# Patient Record
Sex: Female | Born: 1935 | Race: White | Hispanic: No | Smoking: Former smoker
Health system: Southern US, Community
[De-identification: ages and names within clinical notes are randomized; demographics above are authoritative.]

## PROBLEM LIST (undated history)

## (undated) DIAGNOSIS — R0602 Shortness of breath: Secondary | ICD-10-CM

## (undated) DIAGNOSIS — F32A Depression, unspecified: Secondary | ICD-10-CM

## (undated) DIAGNOSIS — R51 Headache: Secondary | ICD-10-CM

## (undated) DIAGNOSIS — N390 Urinary tract infection, site not specified: Secondary | ICD-10-CM

## (undated) DIAGNOSIS — F329 Major depressive disorder, single episode, unspecified: Secondary | ICD-10-CM

## (undated) DIAGNOSIS — D649 Anemia, unspecified: Secondary | ICD-10-CM

## (undated) DIAGNOSIS — I10 Essential (primary) hypertension: Secondary | ICD-10-CM

## (undated) DIAGNOSIS — K219 Gastro-esophageal reflux disease without esophagitis: Secondary | ICD-10-CM

## (undated) DIAGNOSIS — E039 Hypothyroidism, unspecified: Secondary | ICD-10-CM

## (undated) DIAGNOSIS — M549 Dorsalgia, unspecified: Secondary | ICD-10-CM

## (undated) DIAGNOSIS — C801 Malignant (primary) neoplasm, unspecified: Secondary | ICD-10-CM

## (undated) DIAGNOSIS — J45909 Unspecified asthma, uncomplicated: Secondary | ICD-10-CM

## (undated) DIAGNOSIS — A419 Sepsis, unspecified organism: Secondary | ICD-10-CM

## (undated) DIAGNOSIS — I739 Peripheral vascular disease, unspecified: Secondary | ICD-10-CM

## (undated) DIAGNOSIS — G8929 Other chronic pain: Secondary | ICD-10-CM

## (undated) DIAGNOSIS — M7581 Other shoulder lesions, right shoulder: Secondary | ICD-10-CM

## (undated) DIAGNOSIS — M7582 Other shoulder lesions, left shoulder: Secondary | ICD-10-CM

## (undated) HISTORY — PX: TUBAL LIGATION: SHX77

## (undated) HISTORY — PX: ABDOMINAL HYSTERECTOMY: SHX81

## (undated) HISTORY — PX: EYE SURGERY: SHX253

## (undated) HISTORY — PX: BLADDER REPAIR: SHX76

## (undated) HISTORY — DX: Sepsis, unspecified organism: A41.9

## (undated) HISTORY — PX: TONSILLECTOMY: SUR1361

## (undated) HISTORY — PX: APPENDECTOMY: SHX54

## (undated) HISTORY — DX: Urinary tract infection, site not specified: N39.0

---

## 2004-08-31 ENCOUNTER — Ambulatory Visit: Payer: Self-pay | Admitting: Family Medicine

## 2005-01-10 ENCOUNTER — Encounter: Payer: Self-pay | Admitting: Unknown Physician Specialty

## 2005-01-15 ENCOUNTER — Encounter: Payer: Self-pay | Admitting: Unknown Physician Specialty

## 2005-02-14 ENCOUNTER — Encounter: Payer: Self-pay | Admitting: Unknown Physician Specialty

## 2005-09-06 ENCOUNTER — Ambulatory Visit: Payer: Self-pay | Admitting: Unknown Physician Specialty

## 2005-12-13 ENCOUNTER — Ambulatory Visit: Payer: Self-pay | Admitting: Unknown Physician Specialty

## 2006-09-11 ENCOUNTER — Ambulatory Visit: Payer: Self-pay | Admitting: Unknown Physician Specialty

## 2007-11-15 ENCOUNTER — Ambulatory Visit: Payer: Self-pay | Admitting: Unknown Physician Specialty

## 2008-01-04 ENCOUNTER — Ambulatory Visit: Payer: Self-pay | Admitting: Unknown Physician Specialty

## 2008-03-10 ENCOUNTER — Encounter: Payer: Self-pay | Admitting: Rheumatology

## 2008-03-17 ENCOUNTER — Encounter: Payer: Self-pay | Admitting: Rheumatology

## 2008-04-16 ENCOUNTER — Ambulatory Visit: Payer: Self-pay | Admitting: Internal Medicine

## 2008-04-16 ENCOUNTER — Encounter: Payer: Self-pay | Admitting: Rheumatology

## 2008-04-21 ENCOUNTER — Ambulatory Visit: Payer: Self-pay | Admitting: Internal Medicine

## 2008-05-17 ENCOUNTER — Ambulatory Visit: Payer: Self-pay | Admitting: Internal Medicine

## 2008-06-16 ENCOUNTER — Ambulatory Visit: Payer: Self-pay | Admitting: Pain Medicine

## 2008-06-17 ENCOUNTER — Ambulatory Visit: Payer: Self-pay | Admitting: Internal Medicine

## 2008-06-26 ENCOUNTER — Encounter: Payer: Self-pay | Admitting: Pain Medicine

## 2008-07-17 ENCOUNTER — Ambulatory Visit: Payer: Self-pay | Admitting: Internal Medicine

## 2008-07-17 ENCOUNTER — Encounter: Payer: Self-pay | Admitting: Pain Medicine

## 2008-07-21 ENCOUNTER — Ambulatory Visit: Payer: Self-pay | Admitting: Pain Medicine

## 2008-07-31 ENCOUNTER — Ambulatory Visit: Payer: Self-pay | Admitting: Pain Medicine

## 2008-08-17 ENCOUNTER — Encounter: Payer: Self-pay | Admitting: Pain Medicine

## 2008-08-17 ENCOUNTER — Ambulatory Visit: Payer: Self-pay | Admitting: Internal Medicine

## 2008-08-27 ENCOUNTER — Ambulatory Visit: Payer: Self-pay | Admitting: Physician Assistant

## 2008-09-09 ENCOUNTER — Ambulatory Visit: Payer: Self-pay | Admitting: Pain Medicine

## 2008-09-16 ENCOUNTER — Ambulatory Visit: Payer: Self-pay | Admitting: Internal Medicine

## 2008-09-16 ENCOUNTER — Encounter: Payer: Self-pay | Admitting: Pain Medicine

## 2008-10-17 ENCOUNTER — Ambulatory Visit: Payer: Self-pay | Admitting: Internal Medicine

## 2008-10-21 ENCOUNTER — Ambulatory Visit: Payer: Self-pay | Admitting: Pain Medicine

## 2008-12-09 ENCOUNTER — Ambulatory Visit: Payer: Self-pay

## 2008-12-09 ENCOUNTER — Ambulatory Visit: Payer: Self-pay | Admitting: Internal Medicine

## 2008-12-15 ENCOUNTER — Ambulatory Visit: Payer: Self-pay | Admitting: Internal Medicine

## 2009-01-01 ENCOUNTER — Ambulatory Visit: Payer: Self-pay | Admitting: Unknown Physician Specialty

## 2009-01-08 ENCOUNTER — Ambulatory Visit: Payer: Self-pay | Admitting: Physician Assistant

## 2009-01-15 ENCOUNTER — Ambulatory Visit: Payer: Self-pay | Admitting: Internal Medicine

## 2009-02-11 ENCOUNTER — Ambulatory Visit: Payer: Self-pay | Admitting: Physician Assistant

## 2009-03-06 ENCOUNTER — Ambulatory Visit: Payer: Self-pay | Admitting: Internal Medicine

## 2009-03-10 ENCOUNTER — Ambulatory Visit: Payer: Self-pay | Admitting: Physician Assistant

## 2009-03-17 ENCOUNTER — Ambulatory Visit: Payer: Self-pay | Admitting: Internal Medicine

## 2009-03-24 ENCOUNTER — Ambulatory Visit: Payer: Self-pay | Admitting: Pain Medicine

## 2009-04-08 ENCOUNTER — Ambulatory Visit: Payer: Self-pay | Admitting: Physician Assistant

## 2009-05-01 ENCOUNTER — Ambulatory Visit: Payer: Self-pay | Admitting: Internal Medicine

## 2009-05-17 ENCOUNTER — Ambulatory Visit: Payer: Self-pay | Admitting: Internal Medicine

## 2009-06-16 ENCOUNTER — Ambulatory Visit: Payer: Self-pay | Admitting: Pain Medicine

## 2009-06-17 ENCOUNTER — Ambulatory Visit: Payer: Self-pay | Admitting: Internal Medicine

## 2009-06-26 ENCOUNTER — Ambulatory Visit: Payer: Self-pay | Admitting: Internal Medicine

## 2009-06-30 ENCOUNTER — Ambulatory Visit: Payer: Self-pay | Admitting: Pain Medicine

## 2009-07-13 ENCOUNTER — Ambulatory Visit: Payer: Self-pay | Admitting: Pain Medicine

## 2009-07-17 ENCOUNTER — Ambulatory Visit: Payer: Self-pay | Admitting: Internal Medicine

## 2009-10-15 ENCOUNTER — Ambulatory Visit: Payer: Self-pay | Admitting: Physician Assistant

## 2009-11-24 ENCOUNTER — Ambulatory Visit: Payer: Self-pay | Admitting: Pain Medicine

## 2009-12-28 ENCOUNTER — Ambulatory Visit: Payer: Self-pay | Admitting: Pain Medicine

## 2010-01-28 ENCOUNTER — Ambulatory Visit: Payer: Self-pay | Admitting: Pain Medicine

## 2010-03-01 ENCOUNTER — Ambulatory Visit: Payer: Self-pay | Admitting: Pain Medicine

## 2010-07-26 ENCOUNTER — Ambulatory Visit: Payer: Self-pay | Admitting: Pain Medicine

## 2010-08-03 ENCOUNTER — Ambulatory Visit: Payer: Self-pay | Admitting: Pain Medicine

## 2010-08-30 ENCOUNTER — Ambulatory Visit: Payer: Self-pay | Admitting: Pain Medicine

## 2010-09-21 ENCOUNTER — Ambulatory Visit: Payer: Self-pay | Admitting: Pain Medicine

## 2010-10-05 ENCOUNTER — Ambulatory Visit: Payer: Self-pay | Admitting: Pain Medicine

## 2010-11-08 ENCOUNTER — Ambulatory Visit: Payer: Self-pay | Admitting: Pain Medicine

## 2010-11-16 ENCOUNTER — Ambulatory Visit: Payer: Self-pay | Admitting: Unknown Physician Specialty

## 2010-12-06 ENCOUNTER — Ambulatory Visit: Payer: Self-pay | Admitting: Pain Medicine

## 2011-01-05 ENCOUNTER — Ambulatory Visit: Payer: Self-pay | Admitting: Pain Medicine

## 2011-01-11 ENCOUNTER — Ambulatory Visit: Payer: Self-pay | Admitting: Pain Medicine

## 2011-01-25 ENCOUNTER — Ambulatory Visit: Payer: Self-pay | Admitting: Pain Medicine

## 2011-02-07 ENCOUNTER — Ambulatory Visit: Payer: Self-pay | Admitting: Pain Medicine

## 2011-02-09 ENCOUNTER — Other Ambulatory Visit: Payer: Self-pay | Admitting: Pain Medicine

## 2011-02-09 ENCOUNTER — Encounter: Payer: Self-pay | Admitting: Pain Medicine

## 2011-02-15 ENCOUNTER — Encounter: Payer: Self-pay | Admitting: Pain Medicine

## 2011-03-29 ENCOUNTER — Ambulatory Visit: Payer: Self-pay | Admitting: Pain Medicine

## 2011-04-11 ENCOUNTER — Ambulatory Visit: Payer: Self-pay | Admitting: Pain Medicine

## 2011-06-09 ENCOUNTER — Ambulatory Visit: Payer: Self-pay | Admitting: Pain Medicine

## 2011-07-11 ENCOUNTER — Ambulatory Visit: Payer: Self-pay | Admitting: Pain Medicine

## 2011-07-14 ENCOUNTER — Ambulatory Visit: Payer: Self-pay | Admitting: Pain Medicine

## 2011-08-01 ENCOUNTER — Ambulatory Visit: Payer: Self-pay | Admitting: Pain Medicine

## 2012-01-05 ENCOUNTER — Ambulatory Visit: Payer: Self-pay | Admitting: Pain Medicine

## 2012-01-10 ENCOUNTER — Ambulatory Visit: Payer: Self-pay | Admitting: Unknown Physician Specialty

## 2012-01-23 ENCOUNTER — Ambulatory Visit: Payer: Self-pay | Admitting: Pain Medicine

## 2012-02-16 ENCOUNTER — Ambulatory Visit: Payer: Self-pay | Admitting: Pain Medicine

## 2012-03-08 ENCOUNTER — Ambulatory Visit: Payer: Self-pay | Admitting: Pain Medicine

## 2012-04-24 ENCOUNTER — Emergency Department: Payer: Self-pay | Admitting: Unknown Physician Specialty

## 2012-04-24 LAB — URINALYSIS, COMPLETE
Glucose,UR: NEGATIVE mg/dL (ref 0–75)
Leukocyte Esterase: NEGATIVE
Nitrite: NEGATIVE
Ph: 5 (ref 4.5–8.0)
Protein: NEGATIVE
RBC,UR: 2 /HPF (ref 0–5)
Squamous Epithelial: 1

## 2012-05-22 ENCOUNTER — Ambulatory Visit: Payer: Self-pay | Admitting: Pain Medicine

## 2012-06-11 ENCOUNTER — Ambulatory Visit: Payer: Self-pay | Admitting: Pain Medicine

## 2012-07-11 ENCOUNTER — Ambulatory Visit: Payer: Self-pay | Admitting: Pain Medicine

## 2012-07-24 ENCOUNTER — Ambulatory Visit: Payer: Self-pay | Admitting: Pain Medicine

## 2012-10-30 ENCOUNTER — Encounter: Payer: Self-pay | Admitting: Physician Assistant

## 2012-11-09 ENCOUNTER — Ambulatory Visit: Payer: Self-pay | Admitting: Pain Medicine

## 2012-11-13 ENCOUNTER — Ambulatory Visit: Payer: Self-pay | Admitting: Pain Medicine

## 2012-11-17 ENCOUNTER — Encounter: Payer: Self-pay | Admitting: Physician Assistant

## 2012-12-03 ENCOUNTER — Ambulatory Visit: Payer: Self-pay | Admitting: Pain Medicine

## 2013-01-10 ENCOUNTER — Ambulatory Visit: Payer: Self-pay | Admitting: Pain Medicine

## 2013-02-12 DIAGNOSIS — N3020 Other chronic cystitis without hematuria: Secondary | ICD-10-CM | POA: Insufficient documentation

## 2013-02-12 DIAGNOSIS — N3941 Urge incontinence: Secondary | ICD-10-CM | POA: Insufficient documentation

## 2013-02-12 DIAGNOSIS — N390 Urinary tract infection, site not specified: Secondary | ICD-10-CM | POA: Insufficient documentation

## 2013-02-12 DIAGNOSIS — R350 Frequency of micturition: Secondary | ICD-10-CM | POA: Insufficient documentation

## 2013-02-18 ENCOUNTER — Other Ambulatory Visit: Payer: Self-pay | Admitting: Pain Medicine

## 2013-02-18 ENCOUNTER — Ambulatory Visit: Payer: Self-pay | Admitting: Pain Medicine

## 2013-02-18 LAB — BASIC METABOLIC PANEL
Calcium, Total: 9 mg/dL (ref 8.5–10.1)
Chloride: 106 mmol/L (ref 98–107)
EGFR (Non-African Amer.): >60
Osmolality: 280 (ref 275–301)
Sodium: 139 mmol/L (ref 136–145)

## 2013-02-18 LAB — MAGNESIUM: Magnesium: 1.8 mg/dL

## 2013-02-28 ENCOUNTER — Ambulatory Visit: Payer: Self-pay | Admitting: Pain Medicine

## 2013-03-19 ENCOUNTER — Other Ambulatory Visit: Payer: Self-pay | Admitting: Neurosurgery

## 2013-03-25 ENCOUNTER — Ambulatory Visit: Payer: Self-pay | Admitting: Pain Medicine

## 2013-04-29 ENCOUNTER — Encounter (HOSPITAL_COMMUNITY): Payer: Self-pay

## 2013-04-29 ENCOUNTER — Encounter (HOSPITAL_COMMUNITY)
Admission: RE | Admit: 2013-04-29 | Discharge: 2013-04-29 | Disposition: A | Discharge status: Home / Self Care | Payer: Medicare Other | Source: Ambulatory Visit | Attending: Neurosurgery | Admitting: Neurosurgery

## 2013-04-29 HISTORY — DX: Unspecified asthma, uncomplicated: J45.909

## 2013-04-29 HISTORY — DX: Gastro-esophageal reflux disease without esophagitis: K21.9

## 2013-04-29 HISTORY — DX: Depression, unspecified: F32.A

## 2013-04-29 HISTORY — DX: Peripheral vascular disease, unspecified: I73.9

## 2013-04-29 HISTORY — DX: Headache: R51

## 2013-04-29 HISTORY — DX: Shortness of breath: R06.02

## 2013-04-29 HISTORY — DX: Anemia, unspecified: D64.9

## 2013-04-29 HISTORY — DX: Malignant (primary) neoplasm, unspecified: C80.1

## 2013-04-29 HISTORY — DX: Essential (primary) hypertension: I10

## 2013-04-29 HISTORY — DX: Hypothyroidism, unspecified: E03.9

## 2013-04-29 HISTORY — DX: Major depressive disorder, single episode, unspecified: F32.9

## 2013-04-29 LAB — BASIC METABOLIC PANEL
BUN: 18 mg/dL (ref 6–23)
Chloride: 101 mEq/L (ref 96–112)
GFR calc Af Amer: 64 mL/min — ABNORMAL LOW (ref >90)
Potassium: 4.9 mEq/L (ref 3.5–5.1)
Sodium: 138 mEq/L (ref 135–145)

## 2013-04-29 LAB — CBC
HCT: 34.6 % — ABNORMAL LOW (ref 36.0–46.0)
Hemoglobin: 11.2 g/dL — ABNORMAL LOW (ref 12.0–15.0)
MCHC: 32.4 g/dL (ref 30.0–36.0)
RBC: 5.36 MIL/uL — ABNORMAL HIGH (ref 3.87–5.11)
WBC: 7.2 10*3/uL (ref 4.0–10.5)

## 2013-04-29 LAB — SURGICAL PCR SCREEN: MRSA, PCR: POSITIVE — AB

## 2013-04-29 NOTE — Pre-Procedure Instructions (Signed)
Laura Frey  04/29/2013   Your procedure is scheduled on:  Wednesday May 01, 2013  Report to [facility] at 0630 AM.  Call this number if you have problems the morning of surgery: [phone]   Remember:   Do not eat food or drink liquids after midnight.   Take these medicines the morning of surgery with A SIP OF WATER: Allegra,  Amlodipine, Synthroid, Metoprolol, Prilosec, and  Albuterol inhaler bring with day of surgery.   Do not wear jewelry, make-up or nail polish.  Do not wear lotions, powders, or perfumes. You may wear deodorant.  Do not shave 48 hours prior to surgery.   Do not bring valuables to the hospital.  [facility] is not responsible   for any belongings or valuables.  Contacts, dentures or bridgework may not be worn into surgery.  Leave suitcase in the car. After surgery it may be brought to your room.  For patients admitted to the hospital, checkout time is 11:00 AM the day of  discharge.   Patients discharged the day of surgery will not be allowed to drive home.   Special Instructions: Shower using CHG 2 nights before surgery and the night before surgery.  If you shower the day of surgery use CHG.  Use special wash - you have one bottle of CHG for all showers.  You should use approximately 1/3 of the bottle for each shower.   Please read over the following fact sheets that you were given: Pain Booklet, Coughing and Deep Breathing, MRSA Information and Surgical Site Infection Prevention

## 2013-04-30 MED ORDER — CEFAZOLIN SODIUM-DEXTROSE 2-3 GM-% IV SOLR
2.0000 g | INTRAVENOUS | Status: AC
  Administered 2013-05-01: 2 g via INTRAVENOUS
  Filled 2013-04-30 (×2): qty 50

## 2013-05-01 ENCOUNTER — Encounter (HOSPITAL_COMMUNITY)
Admission: RE | Disposition: A | Discharge status: Home Health | Payer: Self-pay | Source: Ambulatory Visit | Attending: Neurosurgery

## 2013-05-01 ENCOUNTER — Ambulatory Visit (HOSPITAL_COMMUNITY): Payer: Medicare Other | Admitting: Certified Registered"

## 2013-05-01 ENCOUNTER — Ambulatory Visit (HOSPITAL_COMMUNITY)
Admission: RE | Admit: 2013-05-01 | Discharge: 2013-05-06 | Disposition: A | Discharge status: Home Health | Payer: Medicare Other | Source: Ambulatory Visit | Attending: Neurosurgery | Admitting: Neurosurgery

## 2013-05-01 ENCOUNTER — Ambulatory Visit (HOSPITAL_COMMUNITY): Payer: Medicare Other

## 2013-05-01 ENCOUNTER — Encounter (HOSPITAL_COMMUNITY): Payer: Self-pay | Admitting: *Deleted

## 2013-05-01 ENCOUNTER — Encounter (HOSPITAL_COMMUNITY): Payer: Self-pay | Admitting: Certified Registered"

## 2013-05-01 DIAGNOSIS — I10 Essential (primary) hypertension: Secondary | ICD-10-CM | POA: Insufficient documentation

## 2013-05-01 DIAGNOSIS — Z01818 Encounter for other preprocedural examination: Secondary | ICD-10-CM | POA: Insufficient documentation

## 2013-05-01 DIAGNOSIS — Z01812 Encounter for preprocedural laboratory examination: Secondary | ICD-10-CM | POA: Insufficient documentation

## 2013-05-01 DIAGNOSIS — Z79899 Other long term (current) drug therapy: Secondary | ICD-10-CM | POA: Insufficient documentation

## 2013-05-01 DIAGNOSIS — M4712 Other spondylosis with myelopathy, cervical region: Secondary | ICD-10-CM | POA: Insufficient documentation

## 2013-05-01 DIAGNOSIS — R262 Difficulty in walking, not elsewhere classified: Secondary | ICD-10-CM | POA: Insufficient documentation

## 2013-05-01 DIAGNOSIS — R0989 Other specified symptoms and signs involving the circulatory and respiratory systems: Secondary | ICD-10-CM | POA: Insufficient documentation

## 2013-05-01 DIAGNOSIS — R0609 Other forms of dyspnea: Secondary | ICD-10-CM | POA: Insufficient documentation

## 2013-05-01 DIAGNOSIS — Z01810 Encounter for preprocedural cardiovascular examination: Secondary | ICD-10-CM | POA: Insufficient documentation

## 2013-05-01 HISTORY — PX: ANTERIOR CERVICAL DECOMP/DISCECTOMY FUSION: SHX1161

## 2013-05-01 HISTORY — DX: Other shoulder lesions, right shoulder: M75.81

## 2013-05-01 HISTORY — DX: Other chronic pain: G89.29

## 2013-05-01 HISTORY — DX: Other shoulder lesions, left shoulder: M75.82

## 2013-05-01 HISTORY — DX: Other chronic pain: M54.9

## 2013-05-01 SURGERY — ANTERIOR CERVICAL DECOMPRESSION/DISCECTOMY FUSION 3 LEVELS
Anesthesia: General | Site: Neck | Wound class: Clean

## 2013-05-01 MED ORDER — DEXAMETHASONE SODIUM PHOSPHATE 10 MG/ML IJ SOLN
10.0000 mg | INTRAMUSCULAR | Status: AC
  Administered 2013-05-01: 10 mg via INTRAVENOUS

## 2013-05-01 MED ORDER — SODIUM CHLORIDE 0.9 % IV SOLN
INTRAVENOUS | Status: AC
  Filled 2013-05-01: qty 500

## 2013-05-01 MED ORDER — LORATADINE 10 MG PO TABS
10.0000 mg | ORAL_TABLET | Freq: Every day | ORAL | Status: DC
  Administered 2013-05-02 – 2013-05-06 (×5): 10 mg via ORAL
  Filled 2013-05-01 (×5): qty 1

## 2013-05-01 MED ORDER — VITAMIN E 180 MG (400 UNIT) PO CAPS
400.0000 [IU] | ORAL_CAPSULE | Freq: Every day | ORAL | Status: DC
  Administered 2013-05-02 – 2013-05-06 (×5): 400 [IU] via ORAL
  Filled 2013-05-01 (×5): qty 1

## 2013-05-01 MED ORDER — ADULT MULTIVITAMIN W/MINERALS CH
1.0000 | ORAL_TABLET | Freq: Every day | ORAL | Status: DC
  Administered 2013-05-02 – 2013-05-06 (×5): 1 via ORAL
  Filled 2013-05-01 (×5): qty 1

## 2013-05-01 MED ORDER — HYDROCHLOROTHIAZIDE 12.5 MG PO CAPS
12.5000 mg | ORAL_CAPSULE | Freq: Every day | ORAL | Status: DC
  Administered 2013-05-02 – 2013-05-06 (×4): 12.5 mg via ORAL
  Filled 2013-05-01 (×5): qty 1

## 2013-05-01 MED ORDER — NEOSTIGMINE METHYLSULFATE 1 MG/ML IJ SOLN
INTRAMUSCULAR | Status: DC | PRN
  Administered 2013-05-01: 3 mg via INTRAVENOUS

## 2013-05-01 MED ORDER — ALBUTEROL SULFATE HFA 108 (90 BASE) MCG/ACT IN AERS
2.0000 | INHALATION_SPRAY | Freq: Four times a day (QID) | RESPIRATORY_TRACT | Status: DC | PRN
  Filled 2013-05-01: qty 6.7

## 2013-05-01 MED ORDER — POTASSIUM CHLORIDE CRYS ER 10 MEQ PO TBCR
10.0000 meq | EXTENDED_RELEASE_TABLET | Freq: Every day | ORAL | Status: DC
  Administered 2013-05-02 – 2013-05-06 (×5): 10 meq via ORAL
  Filled 2013-05-01 (×6): qty 1

## 2013-05-01 MED ORDER — HYDROMORPHONE HCL PF 1 MG/ML IJ SOLN: 0.2500 mg | INTRAMUSCULAR | Status: DC | PRN

## 2013-05-01 MED ORDER — FENTANYL CITRATE 0.05 MG/ML IJ SOLN
INTRAMUSCULAR | Status: DC | PRN
  Administered 2013-05-01: 100 ug via INTRAVENOUS
  Administered 2013-05-01 (×2): 50 ug via INTRAVENOUS

## 2013-05-01 MED ORDER — GLYCOPYRROLATE 0.2 MG/ML IJ SOLN
INTRAMUSCULAR | Status: DC | PRN
  Administered 2013-05-01: 0.4 mg via INTRAVENOUS

## 2013-05-01 MED ORDER — ACETAMINOPHEN 325 MG PO TABS
650.0000 mg | ORAL_TABLET | ORAL | Status: DC | PRN
  Filled 2013-05-01: qty 2

## 2013-05-01 MED ORDER — AMLODIPINE BESYLATE 5 MG PO TABS
5.0000 mg | ORAL_TABLET | Freq: Every day | ORAL | Status: DC
  Administered 2013-05-02 – 2013-05-06 (×5): 5 mg via ORAL
  Filled 2013-05-01 (×5): qty 1

## 2013-05-01 MED ORDER — LIDOCAINE HCL 4 % MT SOLN
OROMUCOSAL | Status: DC | PRN
  Administered 2013-05-01: 4 mL via TOPICAL

## 2013-05-01 MED ORDER — ALUM & MAG HYDROXIDE-SIMETH 200-200-20 MG/5ML PO SUSP: 30.0000 mL | Freq: Four times a day (QID) | ORAL | Status: DC | PRN

## 2013-05-01 MED ORDER — ACETAMINOPHEN 650 MG RE SUPP: 650.0000 mg | RECTAL | Status: DC | PRN

## 2013-05-01 MED ORDER — SODIUM CHLORIDE 0.9 % IJ SOLN: 3.0000 mL | INTRAMUSCULAR | Status: DC | PRN

## 2013-05-01 MED ORDER — ONDANSETRON HCL 4 MG/2ML IJ SOLN: 4.0000 mg | INTRAMUSCULAR | Status: DC | PRN

## 2013-05-01 MED ORDER — BACITRACIN 50000 UNITS IM SOLR
INTRAMUSCULAR | Status: AC
  Filled 2013-05-01: qty 1

## 2013-05-01 MED ORDER — SODIUM CHLORIDE 0.9 % IV SOLN
250.0000 mL | INTRAVENOUS | Status: DC
  Administered 2013-05-01: 250 mL via INTRAVENOUS

## 2013-05-01 MED ORDER — ARTIFICIAL TEARS OP OINT
TOPICAL_OINTMENT | OPHTHALMIC | Status: DC | PRN
  Administered 2013-05-01: 1 via OPHTHALMIC

## 2013-05-01 MED ORDER — ONDANSETRON HCL 4 MG/2ML IJ SOLN: 4.0000 mg | Freq: Once | INTRAMUSCULAR | Status: DC | PRN

## 2013-05-01 MED ORDER — HYDROMORPHONE HCL PF 1 MG/ML IJ SOLN
0.5000 mg | INTRAMUSCULAR | Status: DC | PRN
  Administered 2013-05-01 – 2013-05-03 (×7): 0.5 mg via INTRAVENOUS
  Administered 2013-05-04 (×3): 1 mg via INTRAVENOUS
  Filled 2013-05-01 (×11): qty 1

## 2013-05-01 MED ORDER — PROPOFOL 10 MG/ML IV BOLUS
INTRAVENOUS | Status: DC | PRN
  Administered 2013-05-01: 130 mg via INTRAVENOUS

## 2013-05-01 MED ORDER — ESTROGENS CONJUGATED 0.45 MG PO TABS
0.4500 mg | ORAL_TABLET | Freq: Every day | ORAL | Status: DC
  Administered 2013-05-02 – 2013-05-06 (×5): 0.45 mg via ORAL
  Filled 2013-05-01 (×5): qty 1

## 2013-05-01 MED ORDER — PHENYLEPHRINE HCL 10 MG/ML IJ SOLN
INTRAMUSCULAR | Status: DC | PRN
  Administered 2013-05-01 (×2): 80 ug via INTRAVENOUS

## 2013-05-01 MED ORDER — IRBESARTAN 300 MG PO TABS
300.0000 mg | ORAL_TABLET | Freq: Every day | ORAL | Status: DC
  Administered 2013-05-02 – 2013-05-06 (×5): 300 mg via ORAL
  Filled 2013-05-01 (×5): qty 1

## 2013-05-01 MED ORDER — PHENOL 1.4 % MT LIQD: 1.0000 | OROMUCOSAL | Status: DC | PRN

## 2013-05-01 MED ORDER — OXYCODONE-ACETAMINOPHEN 5-325 MG PO TABS
1.0000 | ORAL_TABLET | ORAL | Status: DC | PRN
  Administered 2013-05-02: 2 via ORAL
  Filled 2013-05-01: qty 2

## 2013-05-01 MED ORDER — LEVOTHYROXINE SODIUM 88 MCG PO TABS
88.0000 ug | ORAL_TABLET | Freq: Every day | ORAL | Status: DC
  Administered 2013-05-02 – 2013-05-06 (×5): 88 ug via ORAL
  Filled 2013-05-01 (×6): qty 1

## 2013-05-01 MED ORDER — ONDANSETRON HCL 4 MG/2ML IJ SOLN
INTRAMUSCULAR | Status: DC | PRN
  Administered 2013-05-01: 4 mg via INTRAVENOUS

## 2013-05-01 MED ORDER — THROMBIN 20000 UNITS EX SOLR
CUTANEOUS | Status: DC | PRN
  Administered 2013-05-01: 10:00:00 via TOPICAL

## 2013-05-01 MED ORDER — SODIUM CHLORIDE 0.9 % IR SOLN
Status: DC | PRN
  Administered 2013-05-01: 10:00:00

## 2013-05-01 MED ORDER — METOPROLOL SUCCINATE ER 100 MG PO TB24
200.0000 mg | ORAL_TABLET | Freq: Every day | ORAL | Status: DC
  Administered 2013-05-02 – 2013-05-06 (×4): 200 mg via ORAL
  Filled 2013-05-01 (×5): qty 2

## 2013-05-01 MED ORDER — DEXAMETHASONE SODIUM PHOSPHATE 10 MG/ML IJ SOLN
INTRAMUSCULAR | Status: AC
  Filled 2013-05-01: qty 1

## 2013-05-01 MED ORDER — VALSARTAN-HYDROCHLOROTHIAZIDE 320-12.5 MG PO TABS: 1.0000 | ORAL_TABLET | Freq: Every day | ORAL | Status: DC

## 2013-05-01 MED ORDER — THROMBIN 5000 UNITS EX SOLR
OROMUCOSAL | Status: DC | PRN
  Administered 2013-05-01: 10:00:00 via TOPICAL

## 2013-05-01 MED ORDER — ROCURONIUM BROMIDE 100 MG/10ML IV SOLN
INTRAVENOUS | Status: DC | PRN
  Administered 2013-05-01: 40 mg via INTRAVENOUS

## 2013-05-01 MED ORDER — MUPIROCIN 2 % EX OINT
TOPICAL_OINTMENT | Freq: Two times a day (BID) | CUTANEOUS | Status: DC
  Administered 2013-05-01: 1 via NASAL
  Administered 2013-05-02 – 2013-05-05 (×5): via NASAL
  Filled 2013-05-01: qty 22

## 2013-05-01 MED ORDER — DOCUSATE SODIUM 100 MG PO CAPS
100.0000 mg | ORAL_CAPSULE | Freq: Two times a day (BID) | ORAL | Status: DC
  Administered 2013-05-01 – 2013-05-05 (×8): 100 mg via ORAL
  Filled 2013-05-01 (×8): qty 1

## 2013-05-01 MED ORDER — LIDOCAINE HCL (CARDIAC) 20 MG/ML IV SOLN
INTRAVENOUS | Status: DC | PRN
  Administered 2013-05-01: 80 mg via INTRAVENOUS

## 2013-05-01 MED ORDER — VITAMIN C 500 MG PO TABS
500.0000 mg | ORAL_TABLET | Freq: Every day | ORAL | Status: DC
  Administered 2013-05-02 – 2013-05-06 (×5): 500 mg via ORAL
  Filled 2013-05-01 (×5): qty 1

## 2013-05-01 MED ORDER — MUPIROCIN 2 % EX OINT
TOPICAL_OINTMENT | CUTANEOUS | Status: AC
  Administered 2013-05-01: 1
  Filled 2013-05-01: qty 22

## 2013-05-01 MED ORDER — CYCLOBENZAPRINE HCL 10 MG PO TABS
10.0000 mg | ORAL_TABLET | Freq: Three times a day (TID) | ORAL | Status: DC | PRN
  Administered 2013-05-04: 10 mg via ORAL
  Filled 2013-05-01 (×2): qty 1

## 2013-05-01 MED ORDER — CEFAZOLIN SODIUM 1-5 GM-% IV SOLN
1.0000 g | Freq: Three times a day (TID) | INTRAVENOUS | Status: AC
  Administered 2013-05-01 (×2): 1 g via INTRAVENOUS
  Filled 2013-05-01 (×2): qty 50

## 2013-05-01 MED ORDER — LACTATED RINGERS IV SOLN
INTRAVENOUS | Status: DC | PRN
  Administered 2013-05-01 (×2): via INTRAVENOUS

## 2013-05-01 MED ORDER — 0.9 % SODIUM CHLORIDE (POUR BTL) OPTIME
TOPICAL | Status: DC | PRN
  Administered 2013-05-01: 1000 mL

## 2013-05-01 MED ORDER — MENTHOL 3 MG MT LOZG
1.0000 | LOZENGE | OROMUCOSAL | Status: DC | PRN
  Filled 2013-05-01: qty 9

## 2013-05-01 MED ORDER — VITAMIN E 15 UNIT/0.3ML PO SOLN: 100.0000 [IU] | Freq: Every day | ORAL | Status: DC

## 2013-05-01 MED ORDER — PANTOPRAZOLE SODIUM 40 MG PO TBEC
40.0000 mg | DELAYED_RELEASE_TABLET | Freq: Every day | ORAL | Status: DC
  Administered 2013-05-02 – 2013-05-06 (×5): 40 mg via ORAL
  Filled 2013-05-01 (×5): qty 1

## 2013-05-01 MED ORDER — SODIUM CHLORIDE 0.9 % IJ SOLN
3.0000 mL | Freq: Two times a day (BID) | INTRAMUSCULAR | Status: DC
  Administered 2013-05-01 – 2013-05-02 (×3): 3 mL via INTRAVENOUS
  Administered 2013-05-03: 22:00:00 via INTRAVENOUS
  Administered 2013-05-03 – 2013-05-05 (×3): 3 mL via INTRAVENOUS

## 2013-05-01 SURGICAL SUPPLY — 59 items
BAG DECANTER FOR FLEXI CONT (MISCELLANEOUS) ×2 IMPLANT
BENZOIN TINCTURE PRP APPL 2/3 (GAUZE/BANDAGES/DRESSINGS) ×2 IMPLANT
BIT DRILL 13 (BIT) ×2 IMPLANT
BONE CERV LORDOTIC 14.5X12X7 (Bone Implant) ×4 IMPLANT
BONE CERV LORDOTIC 14.5X12X8 (Bone Implant) ×2 IMPLANT
BRUSH SCRUB EZ PLAIN DRY (MISCELLANEOUS) ×2 IMPLANT
BUR MATCHSTICK NEURO 3.0 LAGG (BURR) ×2 IMPLANT
CANISTER SUCTION 2500CC (MISCELLANEOUS) ×2 IMPLANT
CLOTH BEACON ORANGE TIMEOUT ST (SAFETY) ×2 IMPLANT
CONT SPEC 4OZ CLIKSEAL STRL BL (MISCELLANEOUS) ×2 IMPLANT
DECANTER SPIKE VIAL GLASS SM (MISCELLANEOUS) ×2 IMPLANT
DERMABOND ADVANCED (GAUZE/BANDAGES/DRESSINGS) ×1
DERMABOND ADVANCED .7 DNX12 (GAUZE/BANDAGES/DRESSINGS) ×1 IMPLANT
DRAPE C-ARM 42X72 X-RAY (DRAPES) ×4 IMPLANT
DRAPE LAPAROTOMY 100X72 PEDS (DRAPES) ×2 IMPLANT
DRAPE MICROSCOPE ZEISS OPMI (DRAPES) ×2 IMPLANT
DRAPE POUCH INSTRU U-SHP 10X18 (DRAPES) ×2 IMPLANT
DRSG OPSITE 4X5.5 SM (GAUZE/BANDAGES/DRESSINGS) ×2 IMPLANT
DURAPREP 6ML APPLICATOR 50/CS (WOUND CARE) ×2 IMPLANT
ELECT COATED BLADE 2.86 ST (ELECTRODE) ×2 IMPLANT
ELECT REM PT RETURN 9FT ADLT (ELECTROSURGICAL) ×2
ELECTRODE REM PT RTRN 9FT ADLT (ELECTROSURGICAL) ×1 IMPLANT
GAUZE SPONGE 4X4 16PLY XRAY LF (GAUZE/BANDAGES/DRESSINGS) IMPLANT
GLOVE BIO SURGEON STRL SZ8 (GLOVE) ×4 IMPLANT
GLOVE BIOGEL PI IND STRL 7.0 (GLOVE) ×4 IMPLANT
GLOVE BIOGEL PI INDICATOR 7.0 (GLOVE) ×4
GLOVE EXAM NITRILE LRG STRL (GLOVE) IMPLANT
GLOVE EXAM NITRILE MD LF STRL (GLOVE) ×2 IMPLANT
GLOVE EXAM NITRILE XL STR (GLOVE) IMPLANT
GLOVE EXAM NITRILE XS STR PU (GLOVE) IMPLANT
GLOVE INDICATOR 8.5 STRL (GLOVE) ×2 IMPLANT
GLOVE SURG SS PI 7.0 STRL IVOR (GLOVE) ×8 IMPLANT
GOWN BRE IMP SLV AUR LG STRL (GOWN DISPOSABLE) IMPLANT
GOWN BRE IMP SLV AUR XL STRL (GOWN DISPOSABLE) ×8 IMPLANT
GOWN STRL REIN 2XL LVL4 (GOWN DISPOSABLE) IMPLANT
HEAD HALTER (SOFTGOODS) ×2 IMPLANT
HEMOSTAT POWDER KIT SURGIFOAM (HEMOSTASIS) IMPLANT
KIT BASIN OR (CUSTOM PROCEDURE TRAY) ×2 IMPLANT
KIT ROOM TURNOVER OR (KITS) ×2 IMPLANT
NEEDLE SPNL 20GX3.5 QUINCKE YW (NEEDLE) ×2 IMPLANT
NS IRRIG 1000ML POUR BTL (IV SOLUTION) ×2 IMPLANT
PACK LAMINECTOMY NEURO (CUSTOM PROCEDURE TRAY) ×2 IMPLANT
PLATE 3 55XLCK NS SPNE CVD (Plate) ×1 IMPLANT
PLATE 3 ATLANTIS TRANS (Plate) ×1 IMPLANT
RUBBERBAND STERILE (MISCELLANEOUS) ×4 IMPLANT
SCREW ST FIX 4 ATL 3120213 (Screw) ×16 IMPLANT
SPONGE GAUZE 4X4 12PLY (GAUZE/BANDAGES/DRESSINGS) ×2 IMPLANT
SPONGE INTESTINAL PEANUT (DISPOSABLE) ×2 IMPLANT
SPONGE SURGIFOAM ABS GEL 100 (HEMOSTASIS) ×2 IMPLANT
STRIP CLOSURE SKIN 1/2X4 (GAUZE/BANDAGES/DRESSINGS) ×2 IMPLANT
SUT VIC AB 3-0 SH 8-18 (SUTURE) ×2 IMPLANT
SUT VICRYL 4-0 PS2 18IN ABS (SUTURE) ×2 IMPLANT
SYR 20ML ECCENTRIC (SYRINGE) ×2 IMPLANT
TAPE CLOTH 4X10 WHT NS (GAUZE/BANDAGES/DRESSINGS) IMPLANT
TAPE STRIPS DRAPE STRL (GAUZE/BANDAGES/DRESSINGS) ×2 IMPLANT
TOWEL OR 17X24 6PK STRL BLUE (TOWEL DISPOSABLE) ×2 IMPLANT
TOWEL OR 17X26 10 PK STRL BLUE (TOWEL DISPOSABLE) ×2 IMPLANT
TRAP SPECIMEN MUCOUS 40CC (MISCELLANEOUS) ×2 IMPLANT
WATER STERILE IRR 1000ML POUR (IV SOLUTION) ×2 IMPLANT

## 2013-05-01 NOTE — Anesthesia Postprocedure Evaluation (Signed)
  Anesthesia Post-op Note  Patient: Laura Frey  Procedure(s) Performed: Procedure(s) with comments: ANTERIOR CERVICAL DECOMPRESSION/DISCECTOMY FUSION 3 LEVELS (N/A) - ANTERIOR CERVICAL DECOMPRESSION/DISCECTOMY FUSION 3 LEVELS  Patient Location: PACU  Anesthesia Type:General  Level of Consciousness: awake, oriented, sedated and patient cooperative  Airway and Oxygen Therapy: Patient Spontanous Breathing  Post-op Pain: mild  Post-op Assessment: Post-op Vital signs reviewed, Patient's Cardiovascular Status Stable, Respiratory Function Stable, Patent Airway, No signs of Nausea or vomiting and Pain level controlled  Post-op Vital Signs: stable  Complications: No apparent anesthesia complications

## 2013-05-01 NOTE — Anesthesia Procedure Notes (Signed)
Procedure Name: Intubation Date/Time: 05/01/2013 8:28 AM Performed by: Jerilee Hoh Pre-anesthesia Checklist: Patient identified, Emergency Drugs available, Suction available and Patient being monitored Patient Re-evaluated:Patient Re-evaluated prior to inductionOxygen Delivery Method: Circle system utilized Preoxygenation: Pre-oxygenation with 100% oxygen Intubation Type: IV induction Ventilation: Mask ventilation without difficulty and Oral airway inserted - appropriate to patient size Laryngoscope Size: Mac and 4 Grade View: Grade III Tube type: Oral Tube size: 7.5 mm Number of attempts: 2 Airway Equipment and Method: Bougie stylet Placement Confirmation: ETT inserted through vocal cords under direct vision,  positive ETCO2 and breath sounds checked- equal and bilateral Secured at: 21 cm Tube secured with: Tape Dental Injury: Injury to lip  Difficulty Due To: Difficult Airway- due to anterior larynx, Difficult Airway- due to limited oral opening and Difficult Airway- due to reduced neck mobility Comments: Easy mask airway.  DL x 1 by CRNA, unable to advance bougie.  DL x 1 by MD, bougie advanced smoothly.  ETT guided over bougie without difficulty. +EtCO2, +BBS.  Small upper lip laceration noted. Ointment applied to lips.

## 2013-05-01 NOTE — Anesthesia Preprocedure Evaluation (Signed)
Anesthesia Evaluation  Patient identified by MRN, date of birth, ID band Patient awake    Reviewed: Allergy & Precautions, H&P , NPO status , Patient's Chart, lab work & pertinent test results  Airway Mallampati: I TM Distance: >3 FB Neck ROM: full    Dental   Pulmonary asthma ,          Cardiovascular hypertension, + Peripheral Vascular Disease Rhythm:regular Rate:Normal     Neuro/Psych  Headaches, PSYCHIATRIC DISORDERS Depression    GI/Hepatic GERD-  ,  Endo/Other  Hypothyroidism   Renal/GU      Musculoskeletal   Abdominal   Peds  Hematology   Anesthesia Other Findings   Reproductive/Obstetrics                           Anesthesia Physical Anesthesia Plan  ASA: II  Anesthesia Plan: General   Post-op Pain Management:    Induction: Intravenous  Airway Management Planned: Oral ETT  Additional Equipment:   Intra-op Plan:   Post-operative Plan: Extubation in OR  Informed Consent: I have reviewed the patients History and Physical, chart, labs and discussed the procedure including the risks, benefits and alternatives for the proposed anesthesia with the patient or authorized representative who has indicated his/her understanding and acceptance.     Plan Discussed with: CRNA, Anesthesiologist and Surgeon  Anesthesia Plan Comments:         Anesthesia Quick Evaluation

## 2013-05-01 NOTE — Op Note (Signed)
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  EBL: Minimal  History of present illness: Patient is a very pleasant 79 year old female is a progress worsening neck pain with pain in the both arms numbness tingling her hands weakness in her hands and difficulty walking. Her clinical exam was consistent with myelopathy cervical spine workup showed severe cervical stenosis kyphosis spinal cord compression or foraminal stenosis at C4-5, C5-6, and C6-7. Due to patient's failure of conservative treatment imaging findings and progression of clinical syndrome I recommended anterior cervical discectomies and fusion at C4-5, C5-6 and C6-7 I have extensively reviewed the risks and benefits of the operation the patient as well as perioperative course expectations about alternatives of surgery she understood and agreed to proceed forward.  Operative procedure: Patient was brought into the or was induced and general anesthesia positioned supine the neck in slight extension in 5 pounds of halter traction the right side of her neck was prepped and draped in routine sterile fashion. Preoperative x-ray localize the appropriate level after this a curvilinear incision was made just off midline to the anterior border of the sternocleidomastoid and the superficial layer of the platysmas dissected and divided longitudinally the avascular plane to circumflex mass and strap as was developed down to the fascia and the fascia was dissected with Kitners. Interoperative X. identify the appropriate level at C4-5 skull lungs close foot laterally and self-retaining retractor was placed. There was marked  osteophytosis in the anterior margins of her cervical spine this is all but with Leksell rongeur and recontouring of the anterior vertebral bodies was carried out. All 3 disc spaces were then incised large a jar sites were bitten off of the joint and the Kerrison punch all 3 disc spaces are were then drilled down the posterior annulus and osteophytic complex. Under microscopic illumination further drilling down C6 and was carried out and aggressive abutting of removal very large spur coming off the posterior aspect of the C6 vertebral body as well as margin of laterally the left uncinate process was grossly hypertrophied and this is all bitten away decompressing the left C6 VII nerve root flush with the left C7 pedicle margin to the right side again marked facet hypertrophy was causing proximal status the C7 nerve root the uncinate process was disarticulated and decompressing skeletonized the C7 nerve root flush with pedicle. This was then packed with Gelfoam after adequate decompression achieved both centrally and foraminally and attention second C4-5 and a similar fashion C4-5 was drilled down aggressive abutting both endplates removed a large posterior spur coming the C4 vertebral body decompress the central canal both C5 pedicles were identified both C5 nerve root roll to decompress. This was impacted patch taken to C5-6 C5-6 was markedly collapsed and kyphotic but this is a disc space is drilled down aggressive abutting both endplates removed a large posterior spur just left of center marking laterally the uncinate was disarticulated skeletonizing C6 nerve roots flush with C6 pedicle. Again the discectomy there is no further stenosis either centrally or foraminally and this is packed with Gelfoam and 3 allograft were selected to 6 mm at C4-5 and C5-6 and 7 mm at C6-7 the plate was incised selected all screws excellent purchase  locking mechanism was engaged. A J-P drain was placed the wounds copiously irrigated  meticulous hemostasis was maintained and the wounds closed in layers with interrupted Vicryl the skin was closed running 4 subcuticular benzoin Steri-Strips were applied patient recovered in stable condition. At the end of case on it counts sponge counts were correct.

## 2013-05-01 NOTE — Progress Notes (Signed)
Pt. Still sating in the 80s, Dr. Katrinka Blazing and DR. Cram aware and order for icu recieved

## 2013-05-01 NOTE — Transfer of Care (Signed)
Immediate Anesthesia Transfer of Care Note  Patient: Laura Frey  Procedure(s) Performed: Procedure(s) with comments: ANTERIOR CERVICAL DECOMPRESSION/DISCECTOMY FUSION 3 LEVELS (N/A) - ANTERIOR CERVICAL DECOMPRESSION/DISCECTOMY FUSION 3 LEVELS  Patient Location: PACU  Anesthesia Type:General  Level of Consciousness: lethargic and responds to stimulation  Airway & Oxygen Therapy: Patient Spontanous Breathing and Patient connected to face mask oxygen  Post-op Assessment: Report given to PACU RN and Post -op Vital signs reviewed and stable  Post vital signs: Reviewed and stable  Complications: No apparent anesthesia complications

## 2013-05-01 NOTE — Preoperative (Signed)
Beta Blockers   Reason not to administer Beta Blockers:Not Applicable 

## 2013-05-01 NOTE — H&P (Signed)
Laura Frey is an 79 y.o. female.   Chief Complaint: Neck pain bilateral arm pain and numbness HPI: Patient is a very pleasant 79 year old female whose had progressive worsening neck pain bilateral numbness and tingling her arms and hands weakness in her hands and difficulty walking. And progressively worse over last several weeks and months workup has revealed severe cervical spondylitic compression of her spinal cord at C4-5 C5-6 C6-7 with kyphosis and cord compression at all 3 levels.. due to her failure conservative treatment her clinical exam consistent with myelopathy and imaging consistent with severe spinal cord compression or cervical spine I recommended anterior cervical on discectomy decompression and fusion I extensively went over the risks and benefits of the operation as well as perioperative course expectations of outcome alternatives surgery she understood and agreed to proceed forward.  Past Medical History  Diagnosis Date  . Hypertension   . Hypothyroidism   . Depression   . Shortness of breath   . Asthma   . Peripheral vascular disease     legs  . GERD (gastroesophageal reflux disease)   . Headache(784.0)   . Cancer     melanoma on back  . Anemia     hx Thalassemia minor anemia    Past Surgical History  Procedure Laterality Date  . Tubal ligation    . Appendectomy    . Tonsillectomy    . Eye surgery Bilateral     cataracts  . Abdominal hysterectomy      vagina  . Bladder repair      after hysterectomy same day    History reviewed. No pertinent family history. Social History:  reports that she quit smoking about 18 years ago. Her smoking use included Cigarettes. She has a 30 pack-year smoking history. She has never used smokeless tobacco. She reports that she does not drink alcohol or use illicit drugs.  Allergies:  Allergies  Allergen Reactions  . Ciprofloxacin Swelling  . Sulfa Antibiotics     Blisters in mouth    Medications Prior to Admission   Medication Sig Dispense Refill  . albuterol (PROVENTIL HFA;VENTOLIN HFA) 108 (90 BASE) MCG/ACT inhaler Inhale 2 puffs into the lungs every 6 (six) hours as needed for wheezing.      Marland Kitchen amLODipine (NORVASC) 5 MG tablet Take 5 mg by mouth daily.      . Cholecalciferol (VITAMIN D) 2000 UNITS tablet Take 2,000 Units by mouth daily.      Marland Kitchen estrogens, conjugated, (PREMARIN) 0.45 MG tablet Take 0.45 mg by mouth daily. Take daily for 21 days then do not take for 7 days.      . fexofenadine (ALLEGRA) 180 MG tablet Take 180 mg by mouth daily.      Marland Kitchen levothyroxine (SYNTHROID, LEVOTHROID) 88 MCG tablet Take 88 mcg by mouth daily before breakfast.      . magnesium oxide (MAG-OX) 400 MG tablet Take 400 mg by mouth daily.      . metoprolol (TOPROL-XL) 200 MG 24 hr tablet Take 200 mg by mouth daily.      . Multiple Vitamin (MULTIVITAMIN WITH MINERALS) TABS Take 1 tablet by mouth daily.      Marland Kitchen omeprazole (PRILOSEC) 20 MG capsule Take 20 mg by mouth daily.      . potassium chloride (K-DUR,KLOR-CON) 10 MEQ tablet Take 10 mEq by mouth daily.      . valsartan-hydrochlorothiazide (DIOVAN-HCT) 320-12.5 MG per tablet Take 1 tablet by mouth daily.      . vitamin C (  ASCORBIC ACID) 500 MG tablet Take 500 mg by mouth daily.      Marland Kitchen VITAMIN E PO Take 500 Units by mouth daily.        Results for orders placed during the hospital encounter of 04/29/13 (from the past 48 hour(s))  SURGICAL PCR SCREEN     Status: Abnormal   Collection Time    04/29/13  1:19 PM      Result Value Range   MRSA, PCR POSITIVE (*) NEGATIVE   Staphylococcus aureus POSITIVE (*) NEGATIVE   Comment:            The Xpert SA Assay (FDA     approved for NASAL specimens     in patients over 44 years of age),     is one component of     a comprehensive surveillance     program.  Test performance has     been validated by [employer] for patients greater     than or equal to 74 year old.     It is not intended     to diagnose infection nor to      guide or monitor treatment.  BASIC METABOLIC PANEL     Status: Abnormal   Collection Time    04/29/13  1:20 PM      Result Value Range   Sodium 138  135 - 145 mEq/L   Potassium 4.9  3.5 - 5.1 mEq/L   Comment: HEMOLYSIS AT THIS LEVEL MAY AFFECT RESULT   Chloride 101  96 - 112 mEq/L   CO2 29  19 - 32 mEq/L   Glucose, Bld 90  70 - 99 mg/dL   BUN 18  6 - 23 mg/dL   Creatinine, Ser 4.54  0.50 - 1.10 mg/dL   Calcium 9.4  8.4 - 09.8 mg/dL   GFR calc non Af Amer 55 (*) >90 mL/min   GFR calc Af Amer 64 (*) >90 mL/min   Comment:            The eGFR has been calculated     using the CKD EPI equation.     This calculation has not been     validated in all clinical     situations.     eGFR's persistently     <90 mL/min signify     possible Chronic Kidney Disease.  CBC     Status: Abnormal   Collection Time    04/29/13  1:20 PM      Result Value Range   WBC 7.2  4.0 - 10.5 K/uL   RBC 5.36 (*) 3.87 - 5.11 MIL/uL   Hemoglobin 11.2 (*) 12.0 - 15.0 g/dL   HCT 11.9 (*) 14.7 - 82.9 %   MCV 64.6 (*) 78.0 - 100.0 fL   MCH 20.9 (*) 26.0 - 34.0 pg   MCHC 32.4  30.0 - 36.0 g/dL   RDW 56.2 (*) 13.0 - 86.5 %   Platelets 314  150 - 400 K/uL   Dg Chest 2 View  04/29/2013   *RADIOLOGY REPORT*  Clinical Data: Preop for anterior cervical decompression  CHEST - 2 VIEW  Comparison: None  Findings: Cardiomediastinal silhouette is unremarkable.  Mild degenerative changes thoracic spine.  No acute infiltrate or pleural effusion.  No pulmonary edema.  IMPRESSION: No active disease.  Mild degenerative changes thoracic spine.   Original Report Authenticated By: Laura Frey, M.D.    Review of Systems  Constitutional: Negative.  HENT: Positive for neck pain.   Eyes: Negative.   Respiratory: Negative.   Cardiovascular: Negative.   Gastrointestinal: Negative.   Genitourinary: Negative.   Musculoskeletal: Positive for myalgias and falls.  Skin: Negative.   Neurological: Positive for tingling.   Endo/Heme/Allergies: Negative.   Psychiatric/Behavioral: Negative.     Blood pressure 134/55, pulse 76, temperature 98.1 F (36.7 C), temperature source Oral, resp. rate 20, SpO2 96.00%. Physical Exam  Constitutional: She is oriented to person, place, and time. She appears well-developed.  HENT:  Head: Normocephalic.  Eyes: Pupils are equal, round, and reactive to light.  Neck: Normal range of motion.  Cardiovascular: Normal rate.   Respiratory: Effort normal.  GI: Soft.  Musculoskeletal: Normal range of motion.  Neurological: She is alert and oriented to person, place, and time. She has normal strength. GCS eye subscore is 4. GCS verbal subscore is 5. GCS motor subscore is 6.  Reflex Scores:      Tricep reflexes are 2+ on the right side and 2+ on the left side.      Bicep reflexes are 2+ on the right side and 2+ on the left side.      Brachioradialis reflexes are 2+ on the right side and 2+ on the left side.      Patellar reflexes are 2+ on the right side and 2+ on the left side.      Achilles reflexes are 2+ on the right side and 2+ on the left side. Strength is 5 out of 5 in her deltoids, biceps, triceps, wrist flexion, wrist extension, and intrinsics.  Skin: Skin is warm.     Assessment/Plan 79 year old female with cervical spondylitic myelopathy presents for anterior cervical discectomies and fusion at C4-5, C5-6, and C6-7.  Laura Frey P 05/01/2013, 8:10 AM

## 2013-05-02 ENCOUNTER — Encounter (HOSPITAL_COMMUNITY): Payer: Self-pay | Admitting: Physical Medicine and Rehabilitation

## 2013-05-02 DIAGNOSIS — M4712 Other spondylosis with myelopathy, cervical region: Secondary | ICD-10-CM

## 2013-05-02 NOTE — Progress Notes (Signed)
Physical Therapy Evaluation Patient Details Name: Laura Frey MRN: [id number] DOB: April 19, 1932 Today's Date: 05/02/2013 Time: 3086-5784 PT Time Calculation (min): 33 min  PT Assessment / Plan / Recommendation History of Present Illness  Pt admit for C4-7 ACDF.  Clinical Impression  Pt admitted with C4-C7 ACDF. Pt currently with functional limitations due to the deficits listed below (see PT Problem List). Pt limited today due to desat with ambulation as well as decr safety awareness questionably due to meds.  Daughter and pt adamant that they not go to SNF for therapy.  Will try for Rehab. Pt will benefit from skilled PT to increase their independence and safety with mobility to allow discharge to the venue listed below.     PT Assessment  Patient needs continued PT services    Follow Up Recommendations  CIR;Supervision - Intermittent    Does the patient have the potential to tolerate intense rehabilitation    yes          Equipment Recommendations  None recommended by PT    Recommendations for Other Services Rehab consult   Frequency Min 5X/week    Precautions / Restrictions Precautions Precautions: Fall;Cervical Required Braces or Orthoses: Cervical Brace Cervical Brace: Soft collar;At all times Restrictions Weight Bearing Restrictions: No   Pertinent Vitals/Pain 100% on 6L on arrival.   Desat to 86% on RA.  Replaced O2 to ambualate pt and O2 on 3L with ambulation with sat >93%.  Left pt on 4L O2 with sat at 96% on departure from room.  Other VSS, Some pain and nursing aware and gave meds prior to ambulation.     Mobility  Bed Mobility Bed Mobility: Not assessed Transfers Transfers: Sit to Stand;Stand to Sit Sit to Stand: 1: +2 Total assist;With upper extremity assist;With armrests;From chair/3-in-1 Sit to Stand: Patient Percentage: 70% Stand to Sit: 1: +2 Total assist;With upper extremity assist;To chair/3-in-1;With armrests Stand to Sit: Patient Percentage:  70% Details for Transfer Assistance: Pt needed cues for hand placement.  Took  incr time to stand up secondary to pain per pt.  Needed steadying assist as well once standing to get balance.   Ambulation/Gait Ambulation/Gait Assistance: 3: Mod assist Ambulation Distance (Feet): 65 Feet Assistive device: Rolling walker Ambulation/Gait Assistance Details: Pt ambulated with RW with cues for sequencing steps and RW.  Pt needed cues to stay close to RW as well.  Pt needed mod cues throughout for safety.  Unsteady gait if challenged even minimally.   Gait Pattern: Step-to pattern;Decreased stride length;Decreased step length - left;Decreased step length - right;Shuffle;Narrow base of support Gait velocity: decreased Stairs: No Wheelchair Mobility Wheelchair Mobility: No         PT Diagnosis: Acute pain;Generalized weakness  PT Problem List: Decreased balance;Decreased mobility;Decreased knowledge of use of DME;Decreased safety awareness;Decreased knowledge of precautions;Pain;Decreased activity tolerance PT Treatment Interventions: DME instruction;Gait training;Functional mobility training;Therapeutic activities;Therapeutic exercise;Balance training;Patient/family education     PT Goals(Current goals can be found in the care plan section) Acute Rehab PT Goals Patient Stated Goal: to go home PT Goal Formulation: With patient/family Time For Goal Achievement: 05/09/13 Potential to Achieve Goals: Good  Visit Information  Last PT Received On: 05/02/13 Assistance Needed: +2 PT/OT Co-Evaluation/Treatment: Yes History of Present Illness: Pt admit for C4-7 ACDF.       Prior Functioning  Home Living Family/patient expects to be discharged to:: Private residence Living Arrangements: Children Available Help at Discharge: Family;Available 24 hours/day Type of Home: House Home Access: Ramped entrance Home Layout:  One level Home Equipment: Walker - 2 wheels;Grab bars - toilet;Grab bars -  tub/shower;Shower seat;Bedside commode Prior Function Level of Independence: Independent Communication Communication: No difficulties    Cognition  Cognition Arousal/Alertness: Lethargic;Suspect due to medications Behavior During Therapy: Flat affect Overall Cognitive Status: Impaired/Different from baseline Area of Impairment: Safety/judgement;Following commands;Awareness;Problem solving Memory: Decreased recall of precautions Following Commands: Follows one step commands consistently;Follows one step commands with increased time Safety/Judgement: Decreased awareness of safety;Decreased awareness of deficits Problem Solving: Slow processing;Decreased initiation;Difficulty sequencing;Requires verbal cues;Requires tactile cues General Comments: Pt processing slowly questionably secondary to meds as pt states,"I am fuzzy."  Daughter present and agrees that pt was not like this at baseline.     Extremity/Trunk Assessment Upper Extremity Assessment Upper Extremity Assessment: Defer to OT evaluation Lower Extremity Assessment Lower Extremity Assessment: Overall WFL for tasks assessed Cervical / Trunk Assessment Cervical / Trunk Assessment: Normal   Balance Balance Balance Assessed: Yes Static Standing Balance Static Standing - Balance Support: Bilateral upper extremity supported;During functional activity Static Standing - Level of Assistance: 3: Mod assist Static Standing - Comment/# of Minutes: 2 minutes with guard assist -min asssit  End of Session PT - End of Session Equipment Utilized During Treatment: Gait belt;Oxygen Activity Tolerance: Patient limited by fatigue Patient left: in chair;with call bell/phone within reach;with family/visitor present Nurse Communication: Mobility status  GP Functional Assessment Tool Used: clinical judgment Functional Limitation: Mobility: Walking and moving around Mobility: Walking and Moving Around Current Status ([id number]): At least 40 percent but  less than 60 percent impaired, limited or restricted Mobility: Walking and Moving Around Goal Status [phone]): At least 1 percent but less than 20 percent impaired, limited or restricted   INGOLD,Juanette Urizar 05/02/2013, 10:18 AM  [facility] [phone] (pager)

## 2013-05-02 NOTE — Progress Notes (Signed)
Rehab Admissions Coordinator Note:  Patient was screened by Clois Dupes for appropriateness for an Inpatient Acute Rehab Consult.  At this time, we are recommending Inpatient Rehab consult.  Clois Dupes 05/02/2013, 10:25 AM  I can be reached at [phone].

## 2013-05-02 NOTE — Progress Notes (Signed)
I discussed rehab venue with pt's daughter. She is in agreement to admission to inpt rehab pending insurance approval. I have begun approval process with [facility] and will follow up in the morning. [phone]

## 2013-05-02 NOTE — Consult Note (Signed)
Physical Medicine and Rehabilitation Consult  Reason for Consult: Cervical myelopathy with weakness bilateral hands and difficulty walking. Referring Physician: Dr. Wynetta Emery  HPI: Laura Frey is a 79 y.o. female with history of HTN, asthma, progressive pain in the both arms,numbness/tingling with weakness in her hands and difficulty walking. Her clinical exam was consistent with myelopathy cervical spine workup showed severe cervical stenosis kyphosis spinal cord compression or foraminal stenosis at C4-5, C5-6, and C6-7. Patient admitted on 05/01/13 for ACDF C4/5, C5/6 and C6/7 by Dr. Wynetta Emery. Therapy evaluations to be done today. MD recommending CIR.    Review of Systems  Constitutional: Positive for malaise/fatigue (chronic due to anemia).  HENT: Positive for neck pain. Negative for hearing loss.   Eyes: Negative for blurred vision and double vision.  Respiratory: Positive for shortness of breath and wheezing.   Cardiovascular: Negative for chest pain and palpitations.  Musculoskeletal: Positive for back pain and joint pain.  Neurological: Negative for headaches.   Past Medical History  Diagnosis Date  . Hypertension   . Hypothyroidism   . Depression   . Shortness of breath   . Asthma   . Peripheral vascular disease     legs  . GERD (gastroesophageal reflux disease)   . Headache(784.0)   . Cancer     melanoma on back  . Anemia     hx Thalassemia minor anemia  . Tendinitis of both rotator cuffs   . Chronic back pain     ESI/uses BC powders   Past Surgical History  Procedure Laterality Date  . Tubal ligation    . Appendectomy    . Tonsillectomy    . Eye surgery Bilateral     cataracts  . Abdominal hysterectomy      vagina  . Bladder repair      after hysterectomy same day   Family History  Problem Relation Age of Onset  . Thalassemia Son   . Thalassemia Paternal Aunt     Social History:  Son lives with her and assists with housework/etc. Independent but sedentary PTA.  Uses [employer]. She reports that she quit smoking about 18 years ago. Her smoking use included Cigarettes. She has a 30 pack-year smoking history. She has never used smokeless tobacco. She reports that she does not drink alcohol or use illicit drugs.   Allergies  Allergen Reactions  . Ciprofloxacin Swelling  . Hydrocodone     headaches  . Oxycodone     headaches  . Sulfa Antibiotics     Blisters in mouth  . Tylenol (Acetaminophen)     headaches  . Ultram (Tramadol)     headaches   Medications Prior to Admission  Medication Sig Dispense Refill  . albuterol (PROVENTIL HFA;VENTOLIN HFA) 108 (90 BASE) MCG/ACT inhaler Inhale 2 puffs into the lungs every 6 (six) hours as needed for wheezing.      Marland Kitchen amLODipine (NORVASC) 5 MG tablet Take 5 mg by mouth daily.      . Cholecalciferol (VITAMIN D) 2000 UNITS tablet Take 2,000 Units by mouth daily.      Marland Kitchen estrogens, conjugated, (PREMARIN) 0.45 MG tablet Take 0.45 mg by mouth daily. Take daily for 21 days then do not take for 7 days.      . fexofenadine (ALLEGRA) 180 MG tablet Take 180 mg by mouth daily.      Marland Kitchen levothyroxine (SYNTHROID, LEVOTHROID) 88 MCG tablet Take 88 mcg by mouth daily before breakfast.      . magnesium oxide (MAG-OX)  400 MG tablet Take 400 mg by mouth daily.      . metoprolol (TOPROL-XL) 200 MG 24 hr tablet Take 200 mg by mouth daily.      . Multiple Vitamin (MULTIVITAMIN WITH MINERALS) TABS Take 1 tablet by mouth daily.      Marland Kitchen omeprazole (PRILOSEC) 20 MG capsule Take 20 mg by mouth daily.      . potassium chloride (K-DUR,KLOR-CON) 10 MEQ tablet Take 10 mEq by mouth daily.      . valsartan-hydrochlorothiazide (DIOVAN-HCT) 320-12.5 MG per tablet Take 1 tablet by mouth daily.      . vitamin C (ASCORBIC ACID) 500 MG tablet Take 500 mg by mouth daily.      Marland Kitchen VITAMIN E PO Take 500 Units by mouth daily.        Home: Home Living Family/patient expects to be discharged to:: Private residence Living Arrangements: Children Available  Help at Discharge: Family;Available 24 hours/day Type of Home: House Home Access: Ramped entrance Home Layout: One level Home Equipment: Walker - 2 wheels;Grab bars - toilet;Grab bars - tub/shower;Shower seat;Bedside commode  Functional History:   Functional Status:  Mobility: Bed Mobility Bed Mobility: Not assessed Transfers Transfers: Sit to Stand;Stand to Sit Sit to Stand: 1: +2 Total assist;With upper extremity assist;With armrests;From chair/3-in-1 Sit to Stand: Patient Percentage: 70% Stand to Sit: 1: +2 Total assist;With upper extremity assist;To chair/3-in-1;With armrests Stand to Sit: Patient Percentage: 70% Ambulation/Gait Ambulation/Gait Assistance: 3: Mod assist Ambulation Distance (Feet): 65 Feet Assistive device: Rolling walker Ambulation/Gait Assistance Details: Pt ambulated with RW with cues for sequencing steps and RW.  Pt needed cues to stay close to RW as well.  Pt needed mod cues throughout for safety.  Unsteady gait if challenged even minimally.   Gait Pattern: Step-to pattern;Decreased stride length;Decreased step length - left;Decreased step length - right;Shuffle;Narrow base of support Gait velocity: decreased Stairs: No Wheelchair Mobility Wheelchair Mobility: No  ADL:    Cognition: Cognition Overall Cognitive Status: Impaired/Different from baseline Orientation Level: Oriented X4 Cognition Arousal/Alertness: Lethargic;Suspect due to medications Behavior During Therapy: Flat affect Overall Cognitive Status: Impaired/Different from baseline Area of Impairment: Safety/judgement;Following commands;Awareness;Problem solving Memory: Decreased recall of precautions Following Commands: Follows one step commands consistently;Follows one step commands with increased time Safety/Judgement: Decreased awareness of safety;Decreased awareness of deficits Problem Solving: Slow processing;Decreased initiation;Difficulty sequencing;Requires verbal cues;Requires  tactile cues General Comments: Pt processing slowly questionably secondary to meds as pt states,"I am fuzzy."  Daughter present and agrees that pt was not like this at baseline.   Blood pressure 93/63, pulse 80, temperature 97.5 F (36.4 C), temperature source Axillary, resp. rate 18, height 5\' 4"  (1.626 m), weight 83.5 kg (184 lb 1.4 oz), SpO2 93.00%.  Physical Exam  Nursing note and vitals reviewed. Constitutional: She is oriented to person, place, and time. She appears well-developed and well-nourished.  A little groggy due to dilaudid.   HENT:  Head: Normocephalic.  Eyes: Conjunctivae are normal. Pupils are equal, round, and reactive to light.  Neck: Normal range of motion. Neck supple.  Cardiovascular: Normal rate and regular rhythm.   Pulmonary/Chest: Effort normal and breath sounds normal. No respiratory distress. She has no wheezes.  Abdominal: Soft. Bowel sounds are normal. She exhibits no distension. There is no tenderness.  Musculoskeletal: She exhibits no edema.  Weakness bilateral shoulders.   Neurological: She is oriented to person, place, and time.  Measured speech. Follows commands without difficulty. Delayed processing. Decreased PP and LT in all 4 but senses  pain. Decreased proprioception. Strength 3 to 3+ in UE, 3- to 3 in legs. DTR's 1+   Skin: Skin is warm and dry.    No results found for this or any previous visit (from the past 24 hour(s)). Dg Cervical Spine 2-3 Views  05/01/2013   *RADIOLOGY REPORT*  Clinical Data: C4-C7 ACDF.  DG C-ARM 1-60 MIN,CERVICAL SPINE - 2-3 VIEW  Comparison: Cervical MRI 02/28/2013.  Findings: Two lateral spot fluoroscopic images of the cervical spine demonstrate anterior discectomy and fusion from C4-C7 with an anterior plate and screws.  Detail is limited by overlap with the shoulders, although no complications are identified.  The alignment is normal.  There are surgical sponges anteriorly within the surgical bed.  IMPRESSION: No  demonstrate complication following C4-C7 ACDF.   Original Report Authenticated By: Carey Bullocks, M.D.   Dg C-arm 1-60 Min  05/01/2013   *RADIOLOGY REPORT*  Clinical Data: C4-C7 ACDF.  DG C-ARM 1-60 MIN,CERVICAL SPINE - 2-3 VIEW  Comparison: Cervical MRI 02/28/2013.  Findings: Two lateral spot fluoroscopic images of the cervical spine demonstrate anterior discectomy and fusion from C4-C7 with an anterior plate and screws.  Detail is limited by overlap with the shoulders, although no complications are identified.  The alignment is normal.  There are surgical sponges anteriorly within the surgical bed.  IMPRESSION: No demonstrate complication following C4-C7 ACDF.   Original Report Authenticated By: Carey Bullocks, M.D.    Assessment/Plan: Diagnosis: cervical central and lateral stenosis s/p multilevel decompression 1. Does the need for close, 24 hr/day medical supervision in concert with the patient's rehab needs make it unreasonable for this patient to be served in a less intensive setting? Yes 2. Co-Morbidities requiring supervision/potential complications: pain mgt, htn, asthma 3. Due to bladder management, bowel management, safety, skin/wound care, disease management, medication administration, pain management and patient education, does the patient require 24 hr/day rehab nursing? Yes 4. Does the patient require coordinated care of a physician, rehab nurse, PT (1-2 hrs/day, 5 days/week) and OT (1-2 hrs/day, 5 days/week) to address physical and functional deficits in the context of the above medical diagnosis(es)? Yes Addressing deficits in the following areas: balance, endurance, locomotion, strength, transferring, bowel/bladder control, bathing, dressing, feeding, grooming, toileting and psychosocial support 5. Can the patient actively participate in an intensive therapy program of at least 3 hrs of therapy per day at least 5 days per week? Yes 6. The potential for patient to make measurable  gains while on inpatient rehab is excellent 7. Anticipated functional outcomes upon discharge from inpatient rehab are supervision with PT, supervision to minimal assist with OT, n/a with SLP. 8. Estimated rehab length of stay to reach the above functional goals is: 2 weeks+ 9. Does the patient have adequate social supports to accommodate these discharge functional goals? Yes and Potentially 10. Anticipated D/C setting: Home 11. Anticipated post D/C treatments: Outpt therapy 12. Overall Rehab/Functional Prognosis: excellent  RECOMMENDATIONS: This patient's condition is appropriate for continued rehabilitative care in the following setting: CIR Patient has agreed to participate in recommended program. Yes Note that insurance prior authorization may be required for reimbursement for recommended care.  Comment: Rehab RN to follow up.   Ranelle Oyster, MD, [place] [facility]     05/02/2013

## 2013-05-02 NOTE — Progress Notes (Signed)
Subjective: Patient reports Gross film better this point she had a rough night feels better this morning her arms and hands feel better she started some neck pain she still has intermittent dropping of her sats when she falls asleep.  Objective: Vital signs in last 24 hours: Temp:  [97.4 F (36.3 C)-98.8 F (37.1 C)] 97.5 F (36.4 C) (07/17 0343) Pulse Rate:  [64-81] 70 (07/17 0600) Resp:  [10-29] 12 (07/17 0600) BP: (97-137)/(34-56) 119/48 mmHg (07/17 0600) SpO2:  [85 %-100 %] 100 % (07/17 0600) Weight:  [83.5 kg (184 lb 1.4 oz)] 83.5 kg (184 lb 1.4 oz) (07/16 1400)  Intake/Output from previous day: 07/16 0701 - 07/17 0700 In: 2216.5 [P.O.:270; I.V.:1846.5; IV Piggyback:100] Out: 1365 [Urine:1175; Drains:90; Blood:100] Intake/Output this shift:    Neurologically stable wound clean and dry  Lab Results:  Recent Labs  04/29/13 1320  WBC 7.2  HGB 11.2*  HCT 34.6*  PLT 314   BMET  Recent Labs  04/29/13 1320  NA 138  K 4.9  CL 101  CO2 29  GLUCOSE 90  BUN 18  CREATININE 0.96  CALCIUM 9.4    Studies/Results: Dg Cervical Spine 2-3 Views  05/01/2013   *RADIOLOGY REPORT*  Clinical Data: C4-C7 ACDF.  DG C-ARM 1-60 MIN,CERVICAL SPINE - 2-3 VIEW  Comparison: Cervical MRI 02/28/2013.  Findings: Two lateral spot fluoroscopic images of the cervical spine demonstrate anterior discectomy and fusion from C4-C7 with an anterior plate and screws.  Detail is limited by overlap with the shoulders, although no complications are identified.  The alignment is normal.  There are surgical sponges anteriorly within the surgical bed.  IMPRESSION: No demonstrate complication following C4-C7 ACDF.   Original Report Authenticated By: Carey Bullocks, M.D.   Dg C-arm 1-60 Min  05/01/2013   *RADIOLOGY REPORT*  Clinical Data: C4-C7 ACDF.  DG C-ARM 1-60 MIN,CERVICAL SPINE - 2-3 VIEW  Comparison: Cervical MRI 02/28/2013.  Findings: Two lateral spot fluoroscopic images of the cervical spine  demonstrate anterior discectomy and fusion from C4-C7 with an anterior plate and screws.  Detail is limited by overlap with the shoulders, although no complications are identified.  The alignment is normal.  There are surgical sponges anteriorly within the surgical bed.  IMPRESSION: No demonstrate complication following C4-C7 ACDF.   Original Report Authenticated By: Carey Bullocks, M.D.    Assessment/Plan: Mobilize physical therapy continue to observe in the ICU throughout the morning monitor oxygen saturation following the lumbar day will transfer her to the floor later  LOS: 1 day     Laura Frey P 05/02/2013, 7:35 AM

## 2013-05-02 NOTE — Evaluation (Signed)
Occupational Therapy Evaluation Patient Details Name: Laura Frey MRN: [id number] DOB: 1931-12-21 Today's Date: 05/02/2013 Time: 4098-1191 OT Time Calculation (min): 31 min  OT Assessment / Plan / Recommendation History of present illness Pt admit for C4-7 ACDF.   Clinical Impression   Pt s/p C4-C7 ACDF.  Pt currently demonstrating poor activity tolerance and need for significant level of assist with ADLs and functional mobility. Will continue to follow pt acutely in order to address below problem list.  Recommending CIR to further progress rehab before eventual return home with family.     OT Assessment  Patient needs continued OT Services    Follow Up Recommendations  CIR    Barriers to Discharge      Equipment Recommendations  None recommended by OT    Recommendations for Other Services Rehab consult  Frequency  Min 2X/week    Precautions / Restrictions Precautions Precautions: Fall;Cervical Required Braces or Orthoses: Cervical Brace Cervical Brace: Soft collar;At all times Restrictions Weight Bearing Restrictions: No   Pertinent Vitals/Pain Desat to 86% on RA.  Ambulated with O2 on 3L nasal cannula with sat >93%. Left pt on 4L O2 nasal cannula with sat >96% at end of session.     ADL  Eating/Feeding: Performed;Set up Where Assessed - Eating/Feeding: Chair Upper Body Bathing: Simulated;Minimal assistance Where Assessed - Upper Body Bathing: Unsupported sitting Lower Body Bathing: Simulated;Maximal assistance Where Assessed - Lower Body Bathing: Unsupported sitting Upper Body Dressing: Performed;Minimal assistance Where Assessed - Upper Body Dressing: Unsupported sitting Lower Body Dressing: Performed;Maximal assistance Where Assessed - Lower Body Dressing: Unsupported sitting Toilet Transfer: Simulated;+2 Total assistance Toilet Transfer: Patient Percentage: 70% Toilet Transfer Method: Sit to stand Toilet Transfer Equipment:  (chair) Equipment Used: Rolling  walker;Gait belt (soft collar) Transfers/Ambulation Related to ADLs: Mod assist ambulating with RW. Cues to stay close to RW and for safety.  Pt requires significant amount of time for basic functional mobility. ADL Comments: Pt limited by fatigue and pain and requires significant amount of time for ADLs and mobility.    OT Diagnosis: Generalized weakness;Cognitive deficits;Acute pain  OT Problem List: Decreased strength;Decreased activity tolerance;Impaired balance (sitting and/or standing);Decreased knowledge of use of DME or AE;Decreased safety awareness;Decreased knowledge of precautions;Cardiopulmonary status limiting activity;Pain;Decreased cognition OT Treatment Interventions: Self-care/ADL training;DME and/or AE instruction;Therapeutic activities;Cognitive remediation/compensation;Patient/family education;Balance training   OT Goals(Current goals can be found in the care plan section) Acute Rehab OT Goals Patient Stated Goal: to go home OT Goal Formulation: With patient Time For Goal Achievement: 05/16/13 Potential to Achieve Goals: Good  Visit Information  Last OT Received On: 05/02/13 Assistance Needed: +2 History of Present Illness: Pt admit for C4-7 ACDF.       Prior Functioning     Home Living Family/patient expects to be discharged to:: Private residence Living Arrangements: Children Available Help at Discharge: Family;Available 24 hours/day Type of Home: House Home Access: Ramped entrance Home Layout: One level Home Equipment: Walker - 2 wheels;Grab bars - toilet;Grab bars - tub/shower;Shower seat;Bedside commode Prior Function Level of Independence: Independent Communication Communication: No difficulties         Vision/Perception Vision - History Baseline Vision: Wears glasses all the time   Cognition  Cognition Arousal/Alertness: Lethargic;Suspect due to medications Behavior During Therapy: Flat affect Overall Cognitive Status: Impaired/Different  from baseline Area of Impairment: Safety/judgement;Following commands;Awareness;Problem solving Memory: Decreased recall of precautions Following Commands: Follows one step commands consistently;Follows one step commands with increased time Safety/Judgement: Decreased awareness of safety;Decreased awareness of deficits Problem Solving:  Slow processing;Decreased initiation;Difficulty sequencing;Requires verbal cues;Requires tactile cues General Comments: Pt processing slowly questionably secondary to meds as pt states,"I am fuzzy."  Daughter present and agrees that pt was not like this at baseline.     Extremity/Trunk Assessment Upper Extremity Assessment Upper Extremity Assessment: Defer to OT evaluation Lower Extremity Assessment Lower Extremity Assessment: Overall WFL for tasks assessed Cervical / Trunk Assessment Cervical / Trunk Assessment: Normal     Mobility Bed Mobility Bed Mobility: Not assessed Transfers Transfers: Sit to Stand;Stand to Sit Sit to Stand: 1: +2 Total assist;From chair/3-in-1;With upper extremity assist;With armrests Sit to Stand: Patient Percentage: 70% Stand to Sit: 1: +2 Total assist;With upper extremity assist;With armrests;To chair/3-in-1 Stand to Sit: Patient Percentage: 70% Details for Transfer Assistance: VCs for safe hand placement. Increased time for transitional movements due to pain.       Exercise     Balance Balance Balance Assessed: Yes Static Standing Balance Static Standing - Balance Support: Bilateral upper extremity supported;During functional activity Static Standing - Level of Assistance: 3: Mod assist Static Standing - Comment/# of Minutes: 2 minutes with guard assist -min asssit   End of Session OT - End of Session Equipment Utilized During Treatment: Oxygen;Gait belt;Cervical collar Activity Tolerance: Patient limited by fatigue;Patient limited by pain Patient left: in chair;with call bell/phone within reach;with family/visitor  present Nurse Communication: Mobility status  GO Functional Assessment Tool Used: clinical judgement Functional Limitation: Self care Self Care Current Status (Z6109): At least 40 percent but less than 60 percent impaired, limited or restricted Self Care Goal Status (U0454): At least 20 percent but less than 40 percent impaired, limited or restricted  05/02/2013 Cipriano Mile OTR/L Pager [phone] Office [phone]  Cipriano Mile 05/02/2013, 10:47 AM

## 2013-05-03 ENCOUNTER — Encounter (HOSPITAL_COMMUNITY): Payer: Self-pay | Admitting: Neurosurgery

## 2013-05-03 MED ORDER — OXYCODONE HCL 5 MG PO TABS
15.0000 mg | ORAL_TABLET | ORAL | Status: DC | PRN
  Administered 2013-05-03: 15 mg via ORAL
  Filled 2013-05-03: qty 3

## 2013-05-03 NOTE — Progress Notes (Signed)
Subjective: Patient reports she is doing well she still has a little that a pair right it is better than it was preoperatively she ambulate well yesterday  Objective: Vital signs in last 24 hours: Temp:  [97.4 F (36.3 C)-98.8 F (37.1 C)] 98.8 F (37.1 C) (07/18 0000) Pulse Rate:  [70-99] 73 (07/18 0700) Resp:  [10-28] 14 (07/18 0700) BP: (61-127)/(38-67) 114/41 mmHg (07/18 0700) SpO2:  [92 %-99 %] 98 % (07/18 0700)  Intake/Output from previous day: 07/17 0701 - 07/18 0700 In: 263 [P.O.:200; I.V.:63] Out: 1025 [Urine:990; Drains:35] Intake/Output this shift:    Strength is improved for placenta 5 bilaterally wound clean and dry  Lab Results: No results found for this basename: WBC, HGB, HCT, PLT,  in the last 72 hours BMET No results found for this basename: NA, K, CL, CO2, GLUCOSE, BUN, CREATININE, CALCIUM,  in the last 72 hours  Studies/Results: Dg Cervical Spine 2-3 Views  05/01/2013   *RADIOLOGY REPORT*  Clinical Data: C4-C7 ACDF.  DG C-ARM 1-60 MIN,CERVICAL SPINE - 2-3 VIEW  Comparison: Cervical MRI 02/28/2013.  Findings: Two lateral spot fluoroscopic images of the cervical spine demonstrate anterior discectomy and fusion from C4-C7 with an anterior plate and screws.  Detail is limited by overlap with the shoulders, although no complications are identified.  The alignment is normal.  There are surgical sponges anteriorly within the surgical bed.  IMPRESSION: No demonstrate complication following C4-C7 ACDF.   Original Report Authenticated By: Carey Bullocks, M.D.   Dg C-arm 1-60 Min  05/01/2013   *RADIOLOGY REPORT*  Clinical Data: C4-C7 ACDF.  DG C-ARM 1-60 MIN,CERVICAL SPINE - 2-3 VIEW  Comparison: Cervical MRI 02/28/2013.  Findings: Two lateral spot fluoroscopic images of the cervical spine demonstrate anterior discectomy and fusion from C4-C7 with an anterior plate and screws.  Detail is limited by overlap with the shoulders, although no complications are identified.  The  alignment is normal.  There are surgical sponges anteriorly within the surgical bed.  IMPRESSION: No demonstrate complication following C4-C7 ACDF.   Original Report Authenticated By: Carey Bullocks, M.D.    Assessment/Plan: Continue mobilizes well with physical therapy she is making rapid progress hopefully she can be discharged home later today if she is cleared by physical therapy  LOS: 2 days     Lidwina Kaner P 05/03/2013, 7:13 AM

## 2013-05-03 NOTE — Progress Notes (Signed)
Physical Therapy Treatment Patient Details Name: Laura Frey MRN: [id number] DOB: [birth year] Today's Date: 05/03/2013 Time: 4098-1191 PT Time Calculation (min): 17 min  PT Assessment / Plan / Recommendation  PT Comments   Pt admitted with C4-7 ACDF. Pt currently with functional limitations due to pt having difficulty tolerating pain meds.  Pt unable to ambulate this am secondary to could not attend to task as pt somewhat lethargic questionably due to meds.  Pt focused on wanting to go home but would not ambulate even with max encouragement and nursing confirms that once she took pain meds she became unable to follow commands as well.  Pt asked for water but could not even hold the cup.  Continue to recommend Rehab as pt cannot go home like she is at present.  If pt clears and a medication is found that pt can tolerate, may be able to go home with HHPT and son with 24 hours.  Pt will benefit from skilled PT to increase their independence and safety with mobility to allow discharge to the venue listed below.   Follow Up Recommendations  CIR;Supervision - Intermittent                 Equipment Recommendations  None recommended by PT    Recommendations for Other Services Rehab consult  Frequency Min 5X/week   Progress towards PT Goals Progress towards PT goals: Progressing toward goals  Plan Current plan remains appropriate    Precautions / Restrictions Precautions Precautions: Fall;Cervical Required Braces or Orthoses: Cervical Brace Cervical Brace: Soft collar;At all times Restrictions Weight Bearing Restrictions: No   Pertinent Vitals/Pain Pt desat to 82% on RA with standing.  Place on 6LO2 to get pt sats up to 90% and >; other VSS, No pain per pt    Mobility  Bed Mobility Bed Mobility: Not assessed Details for Bed Mobility Assistance: pt up in chair Transfers Transfers: Sit to Stand;Stand to Sit Sit to Stand: 1: +2 Total assist;From chair/3-in-1;With upper extremity  assist;With armrests Sit to Stand: Patient Percentage: 50% Stand to Sit: 1: +2 Total assist;With upper extremity assist;With armrests;To chair/3-in-1 Stand to Sit: Patient Percentage: 50% Details for Transfer Assistance: Pt having difficulty following commands questionably due to meds.  Pt was unable to follow one step commands and her attention was focused on wanting to go home however pt would not follow commands for mobility.  Had to facilitate stand heavily and once pt stood, she had all her weight on her heels and could not correct balance even with max cues and assist.  Unable to take steps as well.  Would not use hands at all.   Ambulation/Gait Ambulation/Gait Assistance: Not tested (comment) Stairs: No Wheelchair Mobility Wheelchair Mobility: No    PT Goals (current goals can now be found in the care plan section)    Visit Information  Last PT Received On: 05/03/13 Assistance Needed: +2 PT/OT Co-Evaluation/Treatment: Yes History of Present Illness: Pt admit for C4-7 ACDF.    Subjective Data  Subjective: "I have something in my throat."   Cognition  Cognition Arousal/Alertness: Lethargic;Suspect due to medications Behavior During Therapy: Flat affect Overall Cognitive Status: Impaired/Different from baseline Area of Impairment: Safety/judgement;Following commands;Awareness;Problem solving Memory: Decreased recall of precautions Following Commands: Follows one step commands consistently;Follows one step commands with increased time Safety/Judgement: Decreased awareness of safety;Decreased awareness of deficits Problem Solving: Slow processing;Decreased initiation;Difficulty sequencing;Requires verbal cues;Requires tactile cues General Comments: Pt processing slowly questionably secondary to meds as pt states,"I am fuzzy."  Daughter present and agrees that pt was not like this at baseline.     Balance  Static Standing Balance Static Standing - Balance Support: Bilateral  upper extremity supported;During functional activity Static Standing - Level of Assistance: 3: Mod assist;2: Max assist Static Standing - Comment/# of Minutes: 2  End of Session PT - End of Session Equipment Utilized During Treatment: Gait belt;Oxygen Activity Tolerance: Patient limited by fatigue Patient left: in chair;with call bell/phone within reach;with family/visitor present Nurse Communication: Mobility status        Laura Frey 05/03/2013, 10:48 AM Laura Frey [facility] [phone] (pager)

## 2013-05-03 NOTE — Progress Notes (Signed)
Discussed with RN, PT, and OT as well as son at bedside. Patient has just received pain medication. Up in chair but very sleepy. Son states she only takes [facility] Powder for pain at home due to tolerance issues. Yesterday afternoon pt reportedly did well getting up to bathroom with staff . I discussed that pt did not need inpt rehab once pain medication regimen clarified and that I now recommend home with [facility] . He is in agreement and will contact his sister to make her aware. [phone]

## 2013-05-03 NOTE — Progress Notes (Signed)
Occupational Therapy Treatment Patient Details Name: Laura Frey MRN: [id number] DOB: [birth year] Today's Date: 05/03/2013 Time: 5621-3086 OT Time Calculation (min): 17 min  OT Assessment / Plan / Recommendation  OT comments  Pt s/p C4-7 ACDF.  Pt very limited in today's session likely due to inability to tolerate pain medication. Pt very lethargic and having difficulty to even hold cup to drink.  Pt focused on going home but required max encouragement to participate with therapy.  Will continue to recommend CIR due to pt not safe to go home at current functional level.  If pt improves, may likely be able to go home with [facility] and 24/7 assist from son.  Will update d/c plan pending pt progress.  Follow Up Recommendations  CIR    Barriers to Discharge       Equipment Recommendations  None recommended by OT    Recommendations for Other Services Rehab consult  Frequency Min 2X/week   Progress towards OT Goals Progress towards OT goals: Not progressing toward goals - comment (due to pain medication)  Plan Discharge plan remains appropriate    Precautions / Restrictions Precautions Precautions: Fall;Cervical Required Braces or Orthoses: Cervical Brace Cervical Brace: Soft collar;At all times Restrictions Weight Bearing Restrictions: No   Pertinent Vitals/Pain See vitals    ADL  Eating/Feeding: Performed;Maximal assistance Where Assessed - Eating/Feeding: Chair Toilet Transfer: Simulated;+2 Total assistance Toilet Transfer: Patient Percentage: 50% Toilet Transfer Method: Sit to Barista:  (chair) Equipment Used: Gait belt;Rolling walker Transfers/Ambulation Related to ADLs: +2 total assist for sit<>stand from chair.   ADL Comments: Pt asking for water multiple times but unable to hold cup. Requiring assist to hold cup and bring cup to mouth.      OT Diagnosis:    OT Problem List:   OT Treatment Interventions:     OT Goals(current goals can now be found  in the care plan section) Acute Rehab OT Goals Patient Stated Goal: to go home OT Goal Formulation: With patient Time For Goal Achievement: 05/16/13 Potential to Achieve Goals: Good ADL Goals Pt Will Perform Grooming: standing;with supervision Pt Will Perform Upper Body Bathing: with set-up;sitting Pt Will Perform Lower Body Bathing: with supervision;sit to/from stand Pt Will Perform Upper Body Dressing: with set-up;sitting Pt Will Perform Lower Body Dressing: with supervision;sit to/from stand Pt Will Transfer to Toilet: with supervision;ambulating;bedside commode Pt Will Perform Toileting - Clothing Manipulation and hygiene: with supervision;sit to/from stand  Visit Information  Last OT Received On: 05/03/13 Assistance Needed: +2 PT/OT Co-Evaluation/Treatment: Yes History of Present Illness: Pt admit for C4-7 ACDF.    Subjective Data      Prior Functioning       Cognition  Cognition Arousal/Alertness: Lethargic;Suspect due to medications Behavior During Therapy: Flat affect Overall Cognitive Status: Impaired/Different from baseline Area of Impairment: Safety/judgement;Following commands;Awareness;Problem solving Memory: Decreased recall of precautions Following Commands: Follows one step commands consistently;Follows one step commands with increased time Safety/Judgement: Decreased awareness of safety;Decreased awareness of deficits Problem Solving: Slow processing;Decreased initiation;Difficulty sequencing;Requires verbal cues;Requires tactile cues General Comments: Pt repeatedly stating that she wants to go home and asking why we are forcing her to move.    Mobility  Bed Mobility Bed Mobility: Not assessed Details for Bed Mobility Assistance: pt up in chair Transfers Transfers: Sit to Stand;Stand to Sit Sit to Stand: 1: +2 Total assist;From chair/3-in-1;With upper extremity assist;With armrests Sit to Stand: Patient Percentage: 50% Stand to Sit: 1: +2 Total  assist;With upper extremity assist;With armrests;To  chair/3-in-1 Stand to Sit: Patient Percentage: 50% Details for Transfer Assistance: Pt having difficulty following commands questionably due to meds.  Pt was unable to follow one step commands and her attention was focused on wanting to go home however pt would not follow commands for mobility.  Had to facilitate stand heavily and once pt stood, she had all her weight on her heels and could not correct balance even with max cues and assist.  Unable to take steps as well.  Would not use hands at all.      Exercises      Balance Balance Balance Assessed: Yes Static Standing Balance Static Standing - Balance Support: Bilateral upper extremity supported;During functional activity Static Standing - Level of Assistance: 3: Mod assist;2: Max assist Static Standing - Comment/# of Minutes: 2 minutes   End of Session OT - End of Session Equipment Utilized During Treatment: Oxygen;Gait belt;Cervical collar Activity Tolerance: Patient limited by fatigue;Patient limited by lethargy Patient left: in chair;with call bell/phone within reach;with nursing/sitter in room;with family/visitor present Nurse Communication: Mobility status;Other (comment) (pt too lethargic to ambulate)  GO Functional Assessment Tool Used: clinical judgement Functional Limitation: Self care Self Care Current Status (Z6109): At least 60 percent but less than 80 percent impaired, limited or restricted Self Care Goal Status (U0454): At least 20 percent but less than 40 percent impaired, limited or restricted   05/03/2013 Cipriano Mile OTR/L Pager [phone] Office [phone]  Cipriano Mile 05/03/2013, 11:09 AM

## 2013-05-04 LAB — BASIC METABOLIC PANEL
Calcium: 9.2 mg/dL (ref 8.4–10.5)
Creatinine, Ser: 0.82 mg/dL (ref 0.50–1.10)
GFR calc Af Amer: 77 mL/min — ABNORMAL LOW (ref >90)
GFR calc non Af Amer: 66 mL/min — ABNORMAL LOW (ref >90)
Sodium: 136 mEq/L (ref 135–145)

## 2013-05-04 LAB — URINALYSIS, ROUTINE W REFLEX MICROSCOPIC
Bilirubin Urine: NEGATIVE
Glucose, UA: NEGATIVE mg/dL
Ketones, ur: NEGATIVE mg/dL
Protein, ur: NEGATIVE mg/dL
pH: 6 (ref 5.0–8.0)

## 2013-05-04 MED ORDER — HYDROMORPHONE HCL 2 MG PO TABS: 1.0000 mg | ORAL_TABLET | ORAL | Status: DC | PRN

## 2013-05-04 MED ORDER — KCL IN DEXTROSE-NACL 20-5-0.45 MEQ/L-%-% IV SOLN
INTRAVENOUS | Status: DC
  Administered 2013-05-05: 01:00:00 via INTRAVENOUS
  Filled 2013-05-04 (×4): qty 1000

## 2013-05-04 MED ORDER — HYDROMORPHONE HCL PF 1 MG/ML IJ SOLN
0.5000 mg | INTRAMUSCULAR | Status: DC | PRN
  Administered 2013-05-05: 0.25 mg via INTRAVENOUS
  Filled 2013-05-04: qty 1

## 2013-05-04 MED ORDER — CODEINE SULFATE 15 MG PO TABS
15.0000 mg | ORAL_TABLET | ORAL | Status: DC | PRN
  Administered 2013-05-05 (×2): 15 mg via ORAL
  Filled 2013-05-04: qty 1
  Filled 2013-05-04: qty 2

## 2013-05-04 NOTE — Progress Notes (Signed)
Pt confused at times through out the day. Sitter was assigned to room and pt was receiving pain meds as needed. Pt is unable to tolerate oxycontin, oxycodone, or Vicodin. Pt has been taking dilaudid for pain today. MD on call notified earlier today of pts condition, status/progression, and incontinent episodes. MD ordered UA and culture. RN sent sample to lab. HS RN will continue to follow up.

## 2013-05-04 NOTE — Progress Notes (Signed)
Physical Therapy Treatment Patient Details Name: Laura Frey MRN: [id number] DOB: 02-09-1932 Today's Date: 05/04/2013 Time: 4403-4742 PT Time Calculation (min): 29 min  PT Assessment / Plan / Recommendation  PT Comments   Patient continues to have difficulty with mobility due to lethargy.  Patient requires +2 assist for OOB/ambulation. Recommend ST-SNF for continued therapy at discharge.  Follow Up Recommendations  SNF (unless significant progress is made)     Does the patient have the potential to tolerate intense rehabilitation     Barriers to Discharge        Equipment Recommendations  None recommended by PT    Recommendations for Other Services    Frequency Min 5X/week   Progress towards PT Goals Progress towards PT goals: Not progressing toward goals - comment (due to lethargy related to pain meds)  Plan Discharge plan needs to be updated    Precautions / Restrictions Precautions Precautions: Fall;Cervical Required Braces or Orthoses: Cervical Brace Cervical Brace: Soft collar;At all times Restrictions Weight Bearing Restrictions: No   Pertinent Vitals/Pain Pain limiting mobility    Mobility  Bed Mobility Bed Mobility: Not assessed Details for Bed Mobility Assistance: pt up in chair Transfers Transfers: Sit to Stand;Stand to Sit;Stand Pivot Transfers Sit to Stand: 1: +2 Total assist;From chair/3-in-1;With upper extremity assist;With armrests Sit to Stand: Patient Percentage: 70% Stand to Sit: 1: +2 Total assist;With upper extremity assist;With armrests;To chair/3-in-1 Stand to Sit: Patient Percentage: 70% Stand Pivot Transfers: 1: +2 Total assist Stand Pivot Transfers: Patient Percentage: 70% Details for Transfer Assistance: Verbal cues for hand placement.  Assist to rise to standing and for balance.  Patient with wide base of support when coming to standing.  Difficulty bringing feet together to initiate ambulation.  Step-by-step cues required.  Increased time  for transfers, including BSC to chair. Ambulation/Gait Ambulation/Gait Assistance: 1: +2 Total assist Ambulation/Gait: Patient Percentage: 70% Ambulation Distance (Feet): 42 Feet Assistive device: 2 person hand held assist Ambulation/Gait Assistance Details: Patient with flexed posture - cues to stand upright.  Patient required repeated cueing to continue gait - patient stopping without reason.  Verbal cues to keep eyes open.  Very slow gait speed.  Unsteady during gait. Gait Pattern: Step-to pattern;Decreased stride length;Shuffle;Trunk flexed Gait velocity: decreased    Exercises       PT Goals (current goals can now be found in the care plan section)    Visit Information  Last PT Received On: 05/04/13 Assistance Needed: +2 PT/OT Co-Evaluation/Treatment: Yes History of Present Illness: Pt admit for C4-7 ACDF.    Subjective Data  Subjective: Patient very lethargic.  Difficulty understanding how to use call bell.   Cognition  Cognition Arousal/Alertness: Lethargic;Suspect due to medications Behavior During Therapy: Flat affect Overall Cognitive Status: Impaired/Different from baseline Area of Impairment: Orientation;Attention;Memory;Following commands;Safety/judgement;Problem solving Orientation Level: Disoriented to;Place Current Attention Level: Sustained Memory: Decreased recall of precautions;Decreased short-term memory Following Commands: Follows one step commands with increased time Safety/Judgement: Decreased awareness of safety;Decreased awareness of deficits Problem Solving: Slow processing;Decreased initiation;Difficulty sequencing;Requires verbal cues;Requires tactile cues General Comments: Patient requires repeated cuing to continue gait or task at hand.  Difficulty using call bell and phone.  Very lethargic/groggy.    Balance  Balance Balance Assessed: Yes Static Standing Balance Static Standing - Balance Support: Bilateral upper extremity supported Static  Standing - Level of Assistance: 2: Max assist Static Standing - Comment/# of Minutes: 2 minutes - patient leaning forward in flexed position.  Wide base of support.  Cues to stand  upright and look forward.  End of Session PT - End of Session Equipment Utilized During Treatment: Gait belt Activity Tolerance: Patient limited by lethargy;Patient limited by pain;Patient limited by fatigue (O2 sats dropped to 86% on room air.  O2 reapplied ) Patient left: in chair;with call bell/phone within reach Nurse Communication: Mobility status   GP     Vena Austria 05/04/2013, 10:22 AM Durenda Hurt. Renaldo Fiddler, [facility] Acute Rehab Services Pager [phone]

## 2013-05-04 NOTE — Progress Notes (Signed)
Patient complains of pain but also complains that narcotic pain medicines make her feel bad. She is having no radicular symptoms. She is having no new symptoms of numbness or weakness.  She is afebrile. Her vitals are stable. Urine output is good. She is awake and aware but obviously uncomfortable. Her mobility is poor. Chest and abdomen are benign.  Progressing slowly. Hopes for home discharge seem premature at this time. It was patient makes significant functional improvement she will likely need skilled nursing facility placement. Continue efforts at therapy. I will try to change her pain medication to something that she can tolerate better

## 2013-05-04 NOTE — Plan of Care (Signed)
Problem: Acute Rehab PT Goals Goal: Pt Will Ambulate Outcome: Not Progressing Due to lethargy from pain meds

## 2013-05-04 NOTE — Progress Notes (Signed)
Occupational Therapy Treatment Patient Details Name: Laura Frey MRN: [id number] DOB: 1931/10/29 Today's Date: 05/04/2013 Time: 4098-1191 OT Time Calculation (min): 29 min  OT Assessment / Plan / Recommendation  OT comments  Pt moving very slowly and not processing much of information given to her (possibly due to pain meds). She may need ST SNF if progress continues at this pace.  Follow Up Recommendations  SNF (unless pt starts to progress more quickly)       Equipment Recommendations  None recommended by OT       Frequency Min 2X/week   Progress towards OT Goals Progress towards OT goals: Progressing toward goals  Plan Discharge plan needs to be updated    Precautions / Restrictions Precautions Precautions: Fall;Cervical Required Braces or Orthoses: Cervical Brace Cervical Brace: Soft collar;At all times Restrictions Weight Bearing Restrictions: No   Pertinent Vitals/Pain 10/10 pain in neck; RN in made aware    ADL  Toilet Transfer: Min guard;Performed Toilet Transfer Method: Stand pivot Toilet Transfer Equipment: Bedside commode Equipment Used: Gait belt (soft cervical collar) Transfers/Ambulation Related to ADLs: Min A sit<>stand with VCs for safe hand placement ADL Comments: Pt stating she is in 10/10 pain while lethargic at the same time      OT Goals(current goals can now be found in the care plan section)    Visit Information  Last OT Received On: 05/04/13 Assistance Needed: +2 PT/OT Co-Evaluation/Treatment: Yes History of Present Illness: Pt admit for C4-7 ACDF.          Cognition  Cognition Arousal/Alertness: Lethargic;Suspect due to medications Behavior During Therapy: Flat affect Overall Cognitive Status: Impaired/Different from baseline Area of Impairment: Orientation;Attention;Memory;Following commands;Safety/judgement;Problem solving Orientation Level: Disoriented to;Place Current Attention Level: Sustained Memory: Decreased recall of  precautions;Decreased short-term memory Following Commands: Follows one step commands with increased time Safety/Judgement: Decreased awareness of safety;Decreased awareness of deficits Problem Solving: Slow processing;Decreased initiation;Difficulty sequencing;Requires verbal cues;Requires tactile cues General Comments: Patient requires repeated cuing to continue gait or task at hand.  Difficulty using call bell and phone.  Very lethargic/groggy.    Mobility  Bed Mobility Bed Mobility: Not assessed Details for Bed Mobility Assistance: pt up in chair Transfers Sit to Stand: 1: +2 Total assist;From chair/3-in-1;With upper extremity assist;With armrests Sit to Stand: Patient Percentage: 70% Stand to Sit: 1: +2 Total assist;With upper extremity assist;With armrests;To chair/3-in-1 Stand to Sit: Patient Percentage: 70% Details for Transfer Assistance: Verbal cues for hand placement.  Assist to rise to standing and for balance.  Patient with wide base of support when coming to standing.  Difficulty bringing feet together to initiate ambulation.  Step-by-step cues required.  Increased time for transfers, including BSC to chair.          End of Session OT - End of Session Equipment Utilized During Treatment: Gait belt;Cervical collar Activity Tolerance: Patient limited by fatigue;Patient limited by lethargy Patient left: in chair;with call bell/phone within reach       Evette Georges [phone] 05/04/2013, 9:07 AM

## 2013-05-05 NOTE — Progress Notes (Signed)
Physical Therapy Treatment Patient Details Name: Laura Frey MRN: [id number] DOB: 06/21/1932 Today's Date: 05/05/2013 Time: 4696-2952 PT Time Calculation (min): 33 min  PT Assessment / Plan / Recommendation  PT Comments   Patient making slow gains with mobility now that cognition is clearing - more alert.  Recommend ST-SNF at discharge for continued therapy.  Follow Up Recommendations  SNF     Does the patient have the potential to tolerate intense rehabilitation     Barriers to Discharge        Equipment Recommendations  None recommended by PT    Recommendations for Other Services    Frequency Min 5X/week   Progress towards PT Goals Progress towards PT goals: Progressing toward goals (Very slow progress)  Plan Current plan remains appropriate    Precautions / Restrictions Precautions Precautions: Fall;Cervical Required Braces or Orthoses: Cervical Brace Cervical Brace: Soft collar;At all times Restrictions Weight Bearing Restrictions: No   Pertinent Vitals/Pain     Mobility  Bed Mobility Bed Mobility: Not assessed Transfers Transfers: Sit to Stand;Stand to Sit Sit to Stand: 3: Mod assist;With upper extremity assist;With armrests;From chair/3-in-1 Stand to Sit: 3: Mod assist;With upper extremity assist;With armrests;To chair/3-in-1 Details for Transfer Assistance: Verbal and tactile cues for proper hand placement during tranfers.  Assist to rise to standing.  Increased time for mobility. Ambulation/Gait Ambulation/Gait Assistance: 3: Mod assist (+1 to push chair) Ambulation Distance (Feet): 46 Feet Assistive device: Rolling walker Ambulation/Gait Assistance Details: Verbal cues to stand upright.  Physical assist required to safely maneuver RW, especially during turns.  Patient distracted by conversation occuring in room, and stopped mid gait.  Verbal cues to continue to ambulate.  Balance decreased with gait. Gait Pattern: Step-through pattern;Decreased stride  length;Shuffle;Trunk flexed Gait velocity: Decreased.  Was able to increase speed slightly in hallway with no distractions and verbal cues.      PT Goals (current goals can now be found in the care plan section)    Visit Information  Last PT Received On: 05/05/13 Assistance Needed: +2 (+1 to push chair) History of Present Illness: Pt admit for C4-7 ACDF.    Subjective Data  Subjective: More alert today.  "My neck hurts"   Cognition  Cognition Arousal/Alertness: Awake/alert Behavior During Therapy: Flat affect Overall Cognitive Status: Impaired/Different from baseline Area of Impairment: Orientation;Attention;Memory;Following commands;Safety/judgement;Problem solving Orientation Level: Disoriented to;Time;Place Current Attention Level: Sustained (Easily distracted) Memory: Decreased recall of precautions;Decreased short-term memory Following Commands: Follows one step commands with increased time (Slow to process information) Safety/Judgement: Decreased awareness of safety;Decreased awareness of deficits Problem Solving: Slow processing;Decreased initiation;Difficulty sequencing;Requires verbal cues;Requires tactile cues General Comments: Patient easily distracted by environment - conversations, people/objects in path.  Patient slow processing information - needs instructions repeated multiple times.    Balance     End of Session PT - End of Session Equipment Utilized During Treatment: Gait belt;Cervical collar Activity Tolerance: Patient limited by fatigue;Patient limited by pain Patient left: in chair;with call bell/phone within reach;with family/visitor present;with nursing/sitter in room Nurse Communication: Mobility status   GP     Vena Austria 05/05/2013, 11:12 AM Durenda Hurt. Renaldo Fiddler, [facility] Acute Rehab Services Pager [phone]

## 2013-05-05 NOTE — Progress Notes (Signed)
Subjective: Patient reports neck pain Was too sleepy yesterday because of pain meds - now more awake and more pain  Objective: Vital signs in last 24 hours: Temp:  [97.5 F (36.4 C)-99.1 F (37.3 C)] 98.4 F (36.9 C) (07/20 0200) Pulse Rate:  [75-106] 91 (07/20 0200) Resp:  [16-18] 16 (07/20 0200) BP: (105-132)/(44-53) 126/53 mmHg (07/20 0200) SpO2:  [72 %-100 %] 95 % (07/20 0200)  Intake/Output from previous day: 07/19 0701 - 07/20 0700 In: 240 [P.O.:240] Out: -  Intake/Output this shift:    Wound:c/d/i  Lab Results: No results found for this basename: WBC, HGB, HCT, PLT,  in the last 72 hours BMET  Recent Labs  05/04/13 2107  NA 136  K 3.7  CL 94*  CO2 34*  GLUCOSE 111*  BUN 15  CREATININE 0.82  CALCIUM 9.2   U/a neg  - urine Cx  - pending  Studies/Results: No results found.  Assessment/Plan: Carefully tirate pain meds   - CPM   LOS: 4 days     Laura Space R, MD 05/05/2013, 9:04 AM

## 2013-05-06 NOTE — Progress Notes (Signed)
Patient ID: Laura Frey, female   DOB: 1932-06-30, 79 y.o.   MRN: [id number] The patient is doing great significant improvement much more awake and alert  Strength improved for placenta 5 wound clean and dry flat  Discharge home with home health

## 2013-05-06 NOTE — Discharge Summary (Signed)
Physician Discharge Summary  Patient ID: Laura Frey MRN: [id number] DOB/AGE: 77/23/1933 77 y.o.  Admit date: 05/01/2013 Discharge date: 05/06/2013  Admission Diagnoses: Cervical spondylitic myelopathy from severe cervical stenosis  Discharge Diagnoses: Same Active Problems:   * No active hospital problems. *   Discharged Condition: good  Hospital Course: Patient admitted hospital underwent anterior cervical discectomies and fusion postoperatively patient did very well went to the recovery room and in the ICU was observed in the ICU for low saturations and difficulty breathing this significantly improved in the days after surgery she was transferred to the floor she continued to make significant progress as outpatient therapy she became very sensitive similar pain medication which had to be adjusted periodically however once we came up with a proper regimen she was much more awake and alert and by postoperative day 5 she was awake alert she was oriented her strength is 5 out of 5 her wound is clean and dry and she was stable enough to transfer and discharge home with outpatient physical and occupational therapy  Consults: Significant Diagnostic Studies: Treatments: ACDF 3 levels Discharge Exam: Blood pressure 116/46, pulse 101, temperature 97.6 F (36.4 C), temperature source Oral, resp. rate 18, height 5\' 4"  (1.626 m), weight 83.5 kg (184 lb 1.4 oz), SpO2 98.00%. Strength out of 5 wound clean and dry  Disposition: Home  Discharge Orders   Future Orders Complete By Expires     Face-to-face encounter (required for Medicare/Medicaid patients)  As directed     Comments:      I Shanikqua Zarzycki P certify that this patient is under my care and that I, or a nurse practitioner or physician's assistant working with me, had a face-to-face encounter that meets the physician face-to-face encounter requirements with this patient on 05/06/2013. The encounter with the patient was in whole, or in part for  the following medical condition(s) which is the primary reason for home health care (List medical condition): Myelopathy    Questions:      The encounter with the patient was in whole, or in part, for the following medical condition, which is the primary reason for home health care:  myelopathy    I certify that, based on my findings, the following services are medically necessary home health services:  Physical therapy    My clinical findings support the need for the above services:  Leaving home exacerbates symptoms (pain, dyspnea, anxiety etc.)    Unsafe ambulation due to balance issues    Further, I certify that my clinical findings support that this patient is homebound due to:  Unsafe ambulation due to balance issues    Reason for Medically Necessary Home Health Services:  Therapy- Therapeutic Exercises to Increase Strength and Endurance    Home Health  As directed     Questions:      To provide the following care/treatments:  PT    OT        Medication List         albuterol 108 (90 BASE) MCG/ACT inhaler  Commonly known as:  PROVENTIL HFA;VENTOLIN HFA  Inhale 2 puffs into the lungs every 6 (six) hours as needed for wheezing.     amLODipine 5 MG tablet  Commonly known as:  NORVASC  Take 5 mg by mouth daily.     estrogens (conjugated) 0.45 MG tablet  Commonly known as:  PREMARIN  Take 0.45 mg by mouth daily. Take daily for 21 days then do not take for 7 days.  fexofenadine 180 MG tablet  Commonly known as:  ALLEGRA  Take 180 mg by mouth daily.     levothyroxine 88 MCG tablet  Commonly known as:  SYNTHROID, LEVOTHROID  Take 88 mcg by mouth daily before breakfast.     magnesium oxide 400 MG tablet  Commonly known as:  MAG-OX  Take 400 mg by mouth daily.     metoprolol 200 MG 24 hr tablet  Commonly known as:  TOPROL-XL  Take 200 mg by mouth daily.     multivitamin with minerals Tabs  Take 1 tablet by mouth daily.     omeprazole 20 MG capsule  Commonly known as:   PRILOSEC  Take 20 mg by mouth daily.     potassium chloride 10 MEQ tablet  Commonly known as:  K-DUR,KLOR-CON  Take 10 mEq by mouth daily.     valsartan-hydrochlorothiazide 320-12.5 MG per tablet  Commonly known as:  DIOVAN-HCT  Take 1 tablet by mouth daily.     vitamin C 500 MG tablet  Commonly known as:  ASCORBIC ACID  Take 500 mg by mouth daily.     Vitamin D 2000 UNITS tablet  Take 2,000 Units by mouth daily.     VITAMIN E PO  Take 500 Units by mouth daily.           Follow-up Information   Follow up with Kayln Garceau P, MD.   Contact information:   [address] [place] [address] [phone]       Signed: Nelta Caudill P 05/06/2013, 10:37 AM

## 2013-05-06 NOTE — Progress Notes (Signed)
Physical Therapy Treatment Patient Details Name: Laura Frey MRN: [id number] DOB: 02-Aug-1932 Today's Date: 05/06/2013 Time: 1105-1140 PT Time Calculation (min): 35 min  PT Assessment / Plan / Recommendation  PT Comments   Pt making great progress with therapy this session and anticipates D/C home today.Pt and son have been provided with adequate education to ensure safe D/C home from mobility standpoint. Pt encouraged to have family with her 24/7 when amb around house or performing transfers. Pt and son agreeable to this amount of (A). Pt is supervision to min guard for transfers. Requires min (A) for bed mobility and cues for log rolling technique. Will cont to f/u with pt while in acute setting to maximize functional mobility. Pt is safe from PT standpoint to D/C today with 24/7 (A).   Follow Up Recommendations  Home health PT;Supervision/Assistance - 24 hour     Does the patient have the potential to tolerate intense rehabilitation     Barriers to Discharge        Equipment Recommendations  None recommended by PT    Recommendations for Other Services    Frequency Min 5X/week   Progress towards PT Goals Progress towards PT goals: Progressing toward goals  Plan Discharge plan needs to be updated    Precautions / Restrictions Precautions Precautions: Fall;Cervical Precaution Comments: pt given cervical precautions handout and reviewed with son for bed mobility and home care strategies  Required Braces or Orthoses: Cervical Brace Cervical Brace: Soft collar;At all times Restrictions Weight Bearing Restrictions: No   Pertinent Vitals/Pain 5/10     Mobility  Bed Mobility Bed Mobility: Sit to Sidelying Right;Rolling Left Rolling Left: 5: Supervision Sit to Sidelying Right: 4: Min assist;HOB flat Details for Bed Mobility Assistance: simulated at home bed mobility, disconintued use of rails and HOB flattened; pt required (A) advancing LEs onto bed; and mod cues for log rolling  technique; pt c/o pain with bed mobility; discussed with pt and son alternative sleeping options; pt and son agreed that pt would sleep in recliner and await HHPT to practive bed mobility at home  Transfers Transfers: Stand to Sit;Sit to Stand Sit to Stand: 4: Min guard;5: Supervision;From chair/3-in-1;With armrests Stand to Sit: 4: Min guard;5: Supervision;To chair/3-in-1;To bed Details for Transfer Assistance: pt requires min guard with sit to stand transfer from lower surfaces (ie recliner); min cues for hand placement and safety and to adhere to cervical precautions; pt has tendency to flex neck with transfers Ambulation/Gait Ambulation/Gait Assistance: 5: Supervision Ambulation Distance (Feet): 200 Feet Assistive device: Rolling walker Ambulation/Gait Assistance Details: supervision for safety and min cues for RW management/safety; unable to maintain cervical neck precautions when distracted; tends to twist neck Gait Pattern: Step-through pattern;Decreased stride length;Shuffle;Trunk flexed Gait velocity: was able to increase today minimally Stairs: No Wheelchair Mobility Wheelchair Mobility: No    Exercises     PT Diagnosis:    PT Problem List:   PT Treatment Interventions:     PT Goals (current goals can now be found in the care plan section) Acute Rehab PT Goals Patient Stated Goal: no place like home; hope to go today PT Goal Formulation: With patient/family Time For Goal Achievement: 05/09/13 Potential to Achieve Goals: Good  Visit Information  Last PT Received On: 05/06/13 Assistance Needed: +1 PT/OT Co-Evaluation/Treatment: Yes History of Present Illness: Pt admit for C4-7 ACDF.    Subjective Data  Subjective: Pt sitting in chair; very alert and oriented today. son is present and they are planning to D/C  home today. states "I walked that hall 3 or 4 times yesterday"  Patient Stated Goal: no place like home; hope to go today   Cognition   Cognition Arousal/Alertness: Awake/alert Behavior During Therapy: WFL for tasks assessed/performed Overall Cognitive Status: Within Functional Limits for tasks assessed General Comments: Pt much more alert and oriented during this session; able to follow commands and instructions with mod to max cueing but was more consistent today     Balance  Balance Balance Assessed: Yes Static Standing Balance Static Standing - Balance Support: No upper extremity supported;During functional activity Static Standing - Level of Assistance: 5: Stand by assistance Static Standing - Comment/# of Minutes: static standing while performing perineal hygiene and ADLs ~4 min; vc's for safety   End of Session PT - End of Session Equipment Utilized During Treatment: Gait belt;Cervical collar Activity Tolerance: Patient tolerated treatment well Patient left: in bed;with call bell/phone within reach;with family/visitor present Nurse Communication: Mobility status   GP Functional Assessment Tool Used: clinical judgment Functional Limitation: Mobility: Walking and moving around Mobility: Walking and Moving Around Goal Status [phone]): At least 1 percent but less than 20 percent impaired, limited or restricted Mobility: Walking and Moving Around Discharge Status [phone]): At least 1 percent but less than 20 percent impaired, limited or restricted   Donell Sievert, [place] [phone] 05/06/2013, 12:08 PM

## 2013-05-06 NOTE — Care Management Note (Signed)
    Page 1 of 1   05/06/2013     1:44:34 PM   CARE MANAGEMENT NOTE 05/06/2013  Patient:  Laura Frey, Laura Frey   Account Number:  [id number]  Date Initiated:  05/06/2013  Documentation initiated by:  Elmer Bales  Subjective/Objective Assessment:   Patient was admitted for cervical surgery     Action/Plan:   Will follow for discharge needs.   Anticipated DC Date:  05/06/2013   Anticipated DC Plan:  HOME W HOME HEALTH SERVICES      DC Planning Services  CM consult      Choice offered to / List presented to:  C-1 Patient        HH arranged  HH-2 PT  HH-3 OT      [facility] agency  [facility]   Status of service:  Completed, signed off Medicare Important Message given?   (If response is "NO", the following Medicare IM given date fields will be blank) Date Medicare IM given:   Date Additional Medicare IM given:    Discharge Disposition:  HOME W HOME HEALTH SERVICES  Per UR Regulation:  Reviewed for med. necessity/level of care/duration of stay  If discussed at Long Length of Stay Meetings, dates discussed:    Comments:  05/06/13  1230  Elmer Bales RN, MSN, CM- Met with patient to discuss home health needs.  Pt would like to use [facility]. Mary with [facility] was notified and has accepted the referral.

## 2013-05-06 NOTE — Progress Notes (Signed)
Occupational Therapy Treatment Patient Details Name: Laura Frey MRN: [id number] DOB: 06/20/1932 Today's Date: 05/06/2013 Time: 3474-2595 OT Time Calculation (min): 37 min  OT Assessment / Plan / Recommendation  OT comments  Practiced UB/LB dressing, toileting, grooming at sink, and tub transfer. Pt planning to d/c home today. Recommending pt having someone with her 24/7.    Follow Up Recommendations  Home health OT;Supervision/Assistance - 24 hour    Barriers to Discharge       Equipment Recommendations  None recommended by OT    Recommendations for Other Services    Frequency Min 2X/week   Progress towards OT Goals Progress towards OT goals: Progressing toward goals  Plan Discharge plan needs to be updated    Precautions / Restrictions Precautions Precautions: Fall;Cervical Precaution Comments: pt given cervical precautions handout and reviewed with son for bed mobility and home care strategies  Required Braces or Orthoses: Cervical Brace Cervical Brace: Soft collar;At all times Restrictions Weight Bearing Restrictions: No   Pertinent Vitals/Pain Pain 5/10. Repositioned.     ADL  Grooming: Performed;Wash/dry hands;Supervision/safety Where Assessed - Grooming: Supported standing Upper Body Dressing: Performed;Minimal assistance Where Assessed - Upper Body Dressing: Unsupported sitting Lower Body Dressing: Performed;Moderate assistance Where Assessed - Lower Body Dressing: Supported sit to stand Toilet Transfer: Performed;Min Pension scheme manager Method: Sit to Barista: Raised toilet seat with arms (or 3-in-1 over toilet) Toileting - Clothing Manipulation and Hygiene: Minimal assistance Where Assessed - Engineer, mining and Hygiene: Sit to stand from 3-in-1 or toilet Tub/Shower Transfer: Performed;Minimal assistance Tub/Shower Transfer Method: Science writer: Counsellor Used: Gait  belt;Rolling walker;Other (comment) (cervical collar) Transfers/Ambulation Related to ADLs: Supervision for ambulation. Minguard for transfers.  ADL Comments: Pt practiced UB/LB dressing. Pt at Mod A level for donning pants and Min A to don shirt. Recommending using button up shirts at home. Cues to maintain precautions. OT educated son and pt to use 2 cups for teeth care to avoid bending neck. Pt at Min A level for clothing management and OT educated to be sure she bends from her knees and to have panties/pants pulled up as far as she can before standing to avoid them dropping. Pt practiced tub transfer on transfer bench. Min A level- cues for hand placement and to maintain cervical precautions. Cues throughout session to maintain precautions.    OT Diagnosis:    OT Problem List:   OT Treatment Interventions:     OT Goals(current goals can now be found in the care plan section) Acute Rehab OT Goals Patient Stated Goal: no place like home; hope to go today OT Goal Formulation: With patient Time For Goal Achievement: 05/16/13 Potential to Achieve Goals: Good ADL Goals Pt Will Perform Grooming: standing;with supervision Pt Will Perform Upper Body Bathing: with set-up;sitting Pt Will Perform Lower Body Bathing: with supervision;sit to/from stand Pt Will Perform Upper Body Dressing: with set-up;sitting Pt Will Perform Lower Body Dressing: with supervision;sit to/from stand Pt Will Transfer to Toilet: with supervision;ambulating;bedside commode Pt Will Perform Toileting - Clothing Manipulation and hygiene: with supervision;sit to/from stand  Visit Information  Last OT Received On: 05/06/13 Assistance Needed: +1 PT/OT Co-Evaluation/Treatment: Yes History of Present Illness: Pt admit for C4-7 ACDF.    Subjective Data      Prior Functioning       Cognition  Cognition Arousal/Alertness: Awake/alert Behavior During Therapy: WFL for tasks assessed/performed Overall Cognitive Status:  Within Functional Limits for tasks assessed General Comments: Pt  much more alert and oriented during this session; able to follow commands and instructions with mod to max cueing but was more consistent today     Mobility  Bed Mobility Bed Mobility: Sit to Sidelying Right;Rolling Left Rolling Left: 5: Supervision Sit to Sidelying Right: 4: Min assist;HOB flat Details for Bed Mobility Assistance: simulated at home bed mobility, disconintued use of rails and HOB flattened; pt required (A) advancing LEs onto bed; and mod cues for log rolling technique; pt c/o pain with bed mobility; discussed with pt and son alternative sleeping options; pt and son agreed that pt would sleep in recliner and await HHPT to practive bed mobility at home  Transfers Transfers: Sit to Stand;Stand to Sit Sit to Stand: 4: Min guard;From chair/3-in-1;With armrests Stand to Sit: 4: Min guard;To chair/3-in-1;To bed Details for Transfer Assistance: pt requires min guard with sit to stand transfers.  min cues for hand placement and safety and to adhere to cervical precautions; pt has tendency to flex neck with transfers    Exercises    Balance   End of Session OT - End of Session Equipment Utilized During Treatment: Gait belt;Rolling walker;Cervical collar Activity Tolerance: Patient tolerated treatment well Patient left: in bed;with family/visitor present Nurse Communication: Mobility status  GO     Earlie Raveling OTR/L [phone] 05/06/2013, 12:44 PM

## 2013-05-07 LAB — URINE CULTURE: Colony Count: 75000

## 2013-09-17 ENCOUNTER — Encounter: Payer: Self-pay | Admitting: Neurosurgery

## 2013-10-17 ENCOUNTER — Encounter: Payer: Self-pay | Admitting: Neurosurgery

## 2013-11-17 ENCOUNTER — Encounter: Payer: Self-pay | Admitting: Neurosurgery

## 2013-11-26 ENCOUNTER — Ambulatory Visit: Payer: Self-pay | Admitting: Physician Assistant

## 2013-12-15 ENCOUNTER — Encounter: Payer: Self-pay | Admitting: Neurosurgery

## 2014-01-15 ENCOUNTER — Encounter: Payer: Self-pay | Admitting: Neurosurgery

## 2014-01-27 ENCOUNTER — Emergency Department: Payer: Self-pay | Admitting: Emergency Medicine

## 2014-01-27 ENCOUNTER — Inpatient Hospital Stay (HOSPITAL_COMMUNITY)
Admission: AD | Admit: 2014-01-27 | Discharge: 2014-02-04 | DRG: 871 | Disposition: A | Discharge status: Home Health | Payer: Medicare Other | Source: Other Acute Inpatient Hospital | Attending: Family Medicine | Admitting: Family Medicine

## 2014-01-27 ENCOUNTER — Encounter (HOSPITAL_COMMUNITY): Payer: Self-pay | Admitting: *Deleted

## 2014-01-27 DIAGNOSIS — G8929 Other chronic pain: Secondary | ICD-10-CM | POA: Diagnosis present

## 2014-01-27 DIAGNOSIS — J90 Pleural effusion, not elsewhere classified: Secondary | ICD-10-CM | POA: Diagnosis present

## 2014-01-27 DIAGNOSIS — I10 Essential (primary) hypertension: Secondary | ICD-10-CM | POA: Diagnosis present

## 2014-01-27 DIAGNOSIS — Z90710 Acquired absence of both cervix and uterus: Secondary | ICD-10-CM

## 2014-01-27 DIAGNOSIS — F329 Major depressive disorder, single episode, unspecified: Secondary | ICD-10-CM | POA: Diagnosis present

## 2014-01-27 DIAGNOSIS — F3289 Other specified depressive episodes: Secondary | ICD-10-CM | POA: Diagnosis present

## 2014-01-27 DIAGNOSIS — K219 Gastro-esophageal reflux disease without esophagitis: Secondary | ICD-10-CM | POA: Insufficient documentation

## 2014-01-27 DIAGNOSIS — Z85820 Personal history of malignant melanoma of skin: Secondary | ICD-10-CM

## 2014-01-27 DIAGNOSIS — M549 Dorsalgia, unspecified: Secondary | ICD-10-CM | POA: Diagnosis present

## 2014-01-27 DIAGNOSIS — K9189 Other postprocedural complications and disorders of digestive system: Secondary | ICD-10-CM

## 2014-01-27 DIAGNOSIS — K567 Ileus, unspecified: Secondary | ICD-10-CM

## 2014-01-27 DIAGNOSIS — A419 Sepsis, unspecified organism: Principal | ICD-10-CM

## 2014-01-27 DIAGNOSIS — N3941 Urge incontinence: Secondary | ICD-10-CM | POA: Diagnosis present

## 2014-01-27 DIAGNOSIS — R5381 Other malaise: Secondary | ICD-10-CM | POA: Diagnosis present

## 2014-01-27 DIAGNOSIS — R188 Other ascites: Secondary | ICD-10-CM

## 2014-01-27 DIAGNOSIS — R7309 Other abnormal glucose: Secondary | ICD-10-CM | POA: Diagnosis present

## 2014-01-27 DIAGNOSIS — N289 Disorder of kidney and ureter, unspecified: Secondary | ICD-10-CM | POA: Diagnosis present

## 2014-01-27 DIAGNOSIS — Z79890 Hormone replacement therapy (postmenopausal): Secondary | ICD-10-CM

## 2014-01-27 DIAGNOSIS — Z9851 Tubal ligation status: Secondary | ICD-10-CM

## 2014-01-27 DIAGNOSIS — R933 Abnormal findings on diagnostic imaging of other parts of digestive tract: Secondary | ICD-10-CM

## 2014-01-27 DIAGNOSIS — J9819 Other pulmonary collapse: Secondary | ICD-10-CM | POA: Diagnosis present

## 2014-01-27 DIAGNOSIS — K560 Paralytic ileus: Secondary | ICD-10-CM | POA: Diagnosis present

## 2014-01-27 DIAGNOSIS — N390 Urinary tract infection, site not specified: Secondary | ICD-10-CM

## 2014-01-27 DIAGNOSIS — Z6834 Body mass index (BMI) 34.0-34.9, adult: Secondary | ICD-10-CM

## 2014-01-27 DIAGNOSIS — E871 Hypo-osmolality and hyponatremia: Secondary | ICD-10-CM | POA: Diagnosis present

## 2014-01-27 DIAGNOSIS — D649 Anemia, unspecified: Secondary | ICD-10-CM | POA: Diagnosis present

## 2014-01-27 DIAGNOSIS — I959 Hypotension, unspecified: Secondary | ICD-10-CM

## 2014-01-27 DIAGNOSIS — N3090 Cystitis, unspecified without hematuria: Secondary | ICD-10-CM | POA: Diagnosis present

## 2014-01-27 DIAGNOSIS — R6889 Other general symptoms and signs: Secondary | ICD-10-CM | POA: Diagnosis present

## 2014-01-27 DIAGNOSIS — K5730 Diverticulosis of large intestine without perforation or abscess without bleeding: Secondary | ICD-10-CM | POA: Diagnosis present

## 2014-01-27 DIAGNOSIS — Z87440 Personal history of urinary (tract) infections: Secondary | ICD-10-CM

## 2014-01-27 DIAGNOSIS — Z87891 Personal history of nicotine dependence: Secondary | ICD-10-CM

## 2014-01-27 DIAGNOSIS — N952 Postmenopausal atrophic vaginitis: Secondary | ICD-10-CM | POA: Diagnosis present

## 2014-01-27 DIAGNOSIS — Z981 Arthrodesis status: Secondary | ICD-10-CM

## 2014-01-27 DIAGNOSIS — R6520 Severe sepsis without septic shock: Secondary | ICD-10-CM | POA: Diagnosis present

## 2014-01-27 DIAGNOSIS — R000 Tachycardia, unspecified: Secondary | ICD-10-CM | POA: Diagnosis present

## 2014-01-27 DIAGNOSIS — Z9089 Acquired absence of other organs: Secondary | ICD-10-CM

## 2014-01-27 DIAGNOSIS — E876 Hypokalemia: Secondary | ICD-10-CM | POA: Diagnosis present

## 2014-01-27 DIAGNOSIS — N731 Chronic parametritis and pelvic cellulitis: Secondary | ICD-10-CM | POA: Diagnosis present

## 2014-01-27 DIAGNOSIS — R5383 Other fatigue: Secondary | ICD-10-CM

## 2014-01-27 DIAGNOSIS — J189 Pneumonia, unspecified organism: Secondary | ICD-10-CM | POA: Diagnosis present

## 2014-01-27 DIAGNOSIS — E669 Obesity, unspecified: Secondary | ICD-10-CM | POA: Diagnosis present

## 2014-01-27 DIAGNOSIS — Z79899 Other long term (current) drug therapy: Secondary | ICD-10-CM

## 2014-01-27 DIAGNOSIS — E039 Hypothyroidism, unspecified: Secondary | ICD-10-CM

## 2014-01-27 DIAGNOSIS — I739 Peripheral vascular disease, unspecified: Secondary | ICD-10-CM | POA: Diagnosis present

## 2014-01-27 DIAGNOSIS — G9341 Metabolic encephalopathy: Secondary | ICD-10-CM | POA: Diagnosis present

## 2014-01-27 HISTORY — DX: Sepsis, unspecified organism: A41.9

## 2014-01-27 LAB — CBC WITH DIFFERENTIAL/PLATELET
Basophil #: 0.1 10*3/uL (ref 0.0–0.1)
Basophil %: 0.6 %
EOS ABS: 0.1 10*3/uL (ref 0.0–0.7)
Eosinophil %: 0.7 %
HCT: 33.8 % — ABNORMAL LOW (ref 35.0–47.0)
HGB: 10.5 g/dL — AB (ref 12.0–16.0)
Lymphocyte #: 1.1 10*3/uL (ref 1.0–3.6)
Lymphocyte %: 6 %
MCH: 20.3 pg — AB (ref 26.0–34.0)
MCHC: 31.1 g/dL — ABNORMAL LOW (ref 32.0–36.0)
MCV: 65 fL — ABNORMAL LOW (ref 80–100)
MONO ABS: 1.1 x10 3/mm — AB (ref 0.2–0.9)
Monocyte %: 6.2 %
Neutrophil #: 15.8 10*3/uL — ABNORMAL HIGH (ref 1.4–6.5)
Neutrophil %: 86.5 %
PLATELETS: 375 10*3/uL (ref 150–440)
RBC: 5.19 10*6/uL (ref 3.80–5.20)
RDW: 15.4 % — AB (ref 11.5–14.5)
WBC: 18.2 10*3/uL — ABNORMAL HIGH (ref 3.6–11.0)

## 2014-01-27 LAB — PROCALCITONIN: Procalcitonin: 0.75 ng/mL

## 2014-01-27 LAB — URINALYSIS, ROUTINE W REFLEX MICROSCOPIC
Bilirubin Urine: NEGATIVE
Glucose, UA: NEGATIVE mg/dL
Hgb urine dipstick: NEGATIVE
KETONES UR: NEGATIVE mg/dL
LEUKOCYTES UA: NEGATIVE
NITRITE: NEGATIVE
Protein, ur: NEGATIVE mg/dL
Specific Gravity, Urine: 1.005 (ref 1.005–1.030)
UROBILINOGEN UA: 0.2 mg/dL (ref 0.0–1.0)
pH: 5 (ref 5.0–8.0)

## 2014-01-27 LAB — CBC
HCT: 27.4 % — ABNORMAL LOW (ref 36.0–46.0)
Hemoglobin: 8.9 g/dL — ABNORMAL LOW (ref 12.0–15.0)
MCH: 20.6 pg — AB (ref 26.0–34.0)
MCHC: 32.5 g/dL (ref 30.0–36.0)
MCV: 63.3 fL — ABNORMAL LOW (ref 78.0–100.0)
Platelets: 306 10*3/uL (ref 150–400)
RBC: 4.33 MIL/uL (ref 3.87–5.11)
RDW: 14.8 % (ref 11.5–15.5)
WBC: 16.1 10*3/uL — ABNORMAL HIGH (ref 4.0–10.5)

## 2014-01-27 LAB — BASIC METABOLIC PANEL
ANION GAP: 8 (ref 7–16)
BUN: 18 mg/dL (ref 7–18)
CALCIUM: 8.3 mg/dL — AB (ref 8.5–10.1)
Chloride: 102 mmol/L (ref 98–107)
Co2: 26 mmol/L (ref 21–32)
Creatinine: 1.16 mg/dL (ref 0.60–1.30)
EGFR (African American): 52 — ABNORMAL LOW
GFR CALC NON AF AMER: 45 — AB
Glucose: 97 mg/dL (ref 65–99)
Osmolality: 274 (ref 275–301)
POTASSIUM: 4.4 mmol/L (ref 3.5–5.1)
SODIUM: 136 mmol/L (ref 136–145)

## 2014-01-27 LAB — CREATININE, SERUM
CREATININE: 0.99 mg/dL (ref 0.50–1.10)
GFR calc Af Amer: 61 mL/min — ABNORMAL LOW (ref >90)
GFR calc non Af Amer: 53 mL/min — ABNORMAL LOW (ref >90)

## 2014-01-27 LAB — URINALYSIS, COMPLETE
BILIRUBIN, UR: NEGATIVE
Blood: NEGATIVE
GLUCOSE, UR: NEGATIVE mg/dL (ref 0–75)
Ketone: NEGATIVE
Leukocyte Esterase: NEGATIVE
Nitrite: POSITIVE
Ph: 6 (ref 4.5–8.0)
Protein: NEGATIVE
RBC, UR: NONE SEEN /HPF (ref 0–5)
Specific Gravity: 1.025 (ref 1.003–1.030)

## 2014-01-27 LAB — LACTIC ACID, PLASMA: Lactic Acid, Venous: 0.9 mmol/L (ref 0.5–2.2)

## 2014-01-27 LAB — MRSA PCR SCREENING: MRSA by PCR: NEGATIVE

## 2014-01-27 LAB — TROPONIN I

## 2014-01-27 MED ORDER — VANCOMYCIN HCL IN DEXTROSE 750-5 MG/150ML-% IV SOLN
750.0000 mg | Freq: Two times a day (BID) | INTRAVENOUS | Status: DC
  Administered 2014-01-28 – 2014-01-31 (×6): 750 mg via INTRAVENOUS
  Filled 2014-01-27 (×6): qty 150

## 2014-01-27 MED ORDER — LEVOTHYROXINE SODIUM 75 MCG PO TABS
75.0000 ug | ORAL_TABLET | Freq: Every day | ORAL | Status: DC
  Administered 2014-01-28 – 2014-01-30 (×2): 75 ug via ORAL
  Filled 2014-01-27 (×4): qty 1

## 2014-01-27 MED ORDER — ENOXAPARIN SODIUM 40 MG/0.4ML ~~LOC~~ SOLN
40.0000 mg | SUBCUTANEOUS | Status: DC
Start: 2014-01-27 — End: 2014-02-04
  Administered 2014-01-27 – 2014-02-03 (×8): 40 mg via SUBCUTANEOUS
  Filled 2014-01-27 (×10): qty 0.4

## 2014-01-27 MED ORDER — VITAMIN C 500 MG PO TABS
500.0000 mg | ORAL_TABLET | Freq: Every day | ORAL | Status: DC
  Administered 2014-01-28: 500 mg via ORAL
  Filled 2014-01-27 (×2): qty 1

## 2014-01-27 MED ORDER — PANTOPRAZOLE SODIUM 40 MG PO TBEC
40.0000 mg | DELAYED_RELEASE_TABLET | Freq: Every day | ORAL | Status: DC
  Administered 2014-01-27 – 2014-01-29 (×3): 40 mg via ORAL
  Filled 2014-01-27 (×4): qty 1

## 2014-01-27 MED ORDER — LORATADINE 10 MG PO TABS
10.0000 mg | ORAL_TABLET | Freq: Every day | ORAL | Status: DC
  Administered 2014-01-28 – 2014-01-29 (×2): 10 mg via ORAL
  Filled 2014-01-27 (×3): qty 1

## 2014-01-27 MED ORDER — GUAIFENESIN-DM 100-10 MG/5ML PO SYRP: 5.0000 mL | ORAL_SOLUTION | ORAL | Status: DC | PRN

## 2014-01-27 MED ORDER — BENZONATATE 100 MG PO CAPS: 100.0000 mg | ORAL_CAPSULE | Freq: Three times a day (TID) | ORAL | Status: DC | PRN

## 2014-01-27 MED ORDER — ALBUTEROL SULFATE (2.5 MG/3ML) 0.083% IN NEBU
2.5000 mg | INHALATION_SOLUTION | Freq: Four times a day (QID) | RESPIRATORY_TRACT | Status: DC | PRN
  Administered 2014-01-30: 2.5 mg via RESPIRATORY_TRACT
  Filled 2014-01-27: qty 3

## 2014-01-27 MED ORDER — ADULT MULTIVITAMIN W/MINERALS CH
1.0000 | ORAL_TABLET | Freq: Every day | ORAL | Status: DC
  Administered 2014-01-28: 1 via ORAL
  Filled 2014-01-27 (×2): qty 1

## 2014-01-27 MED ORDER — DEXTROSE 5 % IV SOLN
1.0000 g | Freq: Three times a day (TID) | INTRAVENOUS | Status: DC
  Administered 2014-01-27 – 2014-01-30 (×8): 1 g via INTRAVENOUS
  Filled 2014-01-27 (×10): qty 1

## 2014-01-27 MED ORDER — VITAMIN D 1000 UNITS PO TABS
2000.0000 [IU] | ORAL_TABLET | Freq: Every day | ORAL | Status: DC
  Administered 2014-01-28: 2000 [IU] via ORAL
  Filled 2014-01-27 (×2): qty 2

## 2014-01-27 MED ORDER — SODIUM CHLORIDE 0.9 % IV SOLN
INTRAVENOUS | Status: DC
  Administered 2014-01-27: 125 mL/h via INTRAVENOUS
  Administered 2014-01-28 (×2): via INTRAVENOUS
  Administered 2014-01-29: 125 mL/h via INTRAVENOUS

## 2014-01-27 MED ORDER — ESTROGENS CONJUGATED 0.45 MG PO TABS
0.4500 mg | ORAL_TABLET | Freq: Every day | ORAL | Status: DC
  Administered 2014-01-28 – 2014-01-29 (×2): 0.45 mg via ORAL
  Filled 2014-01-27 (×3): qty 1

## 2014-01-27 MED ORDER — MAGNESIUM OXIDE 400 (241.3 MG) MG PO TABS
400.0000 mg | ORAL_TABLET | Freq: Every day | ORAL | Status: DC
  Administered 2014-01-28: 400 mg via ORAL
  Filled 2014-01-27 (×2): qty 1

## 2014-01-27 MED ORDER — VANCOMYCIN HCL IN DEXTROSE 750-5 MG/150ML-% IV SOLN
750.0000 mg | Freq: Once | INTRAVENOUS | Status: AC
  Administered 2014-01-28: 750 mg via INTRAVENOUS
  Filled 2014-01-27: qty 150

## 2014-01-27 NOTE — H&P (Signed)
History and Physical       Hospital Admission Note Date: 01/27/2014  Patient name: Laura Frey Medical record number: [id number] Date of birth: 1932/09/10 Age: 80 y.o. Gender: female  PCP: [facility] in [place]    Chief Complaint:  Shortness of breath with coughing, fevers for last 2 weeks  HPI: Patient is a 80 year old female with history of hypertension, hypothyroidism, recurrent UTIs, GERD initially presented to [facility] ER with coughing, shortness of breath and fevers. Patient required step down care for possible sepsis hence transferred to [facility] for further care. History was obtained from the patient who reported that she lives with her son who also had URI symptoms. She's been having coughing with loss of appetite, productive phlegm, shortness of breath for the last 2 weeks. Patient went to her PCP who placed her on CPAP, patient has finished a Z-Pak. Patient reports that her symptoms continued to worsen. Her son noticed that she was getting confused today, talking out of her head with significant generalized weakness. In the ER, patient was found to have initial BP 105/45, pulse 108, temp of 102.7 chest x-ray showed right basilar infiltrates CBC showed white count of 18.2K, hemoglobin 10.5 hematocrit 33.8 platelets 375 with neutrophils 86.5% troponin less than 0.02 Sodium 136 potassium 4.4, BUN 18, creatinine 1.1 UA positive for UTI     Review of Systems:  Constitutional: ++ fever, chills, diaphoresis, poor appetite and fatigue.  HEENT: Denies photophobia, eye pain, redness, hearing loss, ear pain, sneezing, mouth sores, trouble swallowing, neck pain, neck stiffness and tinnitus.  + sore throat with congestion  Respiratory:Please see history of present illness  Cardiovascular: Denies chest pain, palpitations and leg swelling.  Gastrointestinal: Denies nausea, vomiting, abdominal pain, diarrhea,  constipation, blood in stool and abdominal distention.  Genitourinary: ++ dysuria, urgency, frequency. No hematuria, flank pain  Musculoskeletal: Denies myalgias, back pain, joint swelling, arthralgias and gait problem.  Skin: Denies pallor, rash and wound.  Neurological: Denies dizziness, seizures, syncope, light-headedness, numbness and headaches. ++ generalized weakness  Hematological: Denies adenopathy. Easy bruising, personal or family bleeding history  Psychiatric/Behavioral: Denies suicidal ideation, mood changes, nervousness, sleep disturbance and agitation, Confusion today, currently improving per family at bedside.   Past Medical History: Past Medical History  Diagnosis Date  . Hypertension   . Hypothyroidism   . Depression   . Shortness of breath   . Asthma   . Peripheral vascular disease     legs  . GERD (gastroesophageal reflux disease)   . Headache(784.0)   . Cancer     melanoma on back  . Anemia     hx Thalassemia minor anemia  . Tendinitis of both rotator cuffs   . Chronic back pain     ESI/uses BC powders   Past Surgical History  Procedure Laterality Date  . Tubal ligation    . Appendectomy    . Tonsillectomy    . Eye surgery Bilateral     cataracts  . Abdominal hysterectomy      vagina  . Bladder repair      after hysterectomy same day  . Anterior cervical decomp/discectomy fusion N/A 05/01/2013    Procedure: ANTERIOR CERVICAL DECOMPRESSION/DISCECTOMY FUSION 3 LEVELS;  Surgeon: Elaina Hoops, MD;  Location: [place] NEURO ORS;  Service: Neurosurgery;  Laterality: N/A;  ANTERIOR CERVICAL DECOMPRESSION/DISCECTOMY FUSION 3 LEVELS    Medications: Prior to Admission medications   Medication Sig Start Date End Date Taking? Authorizing Provider  albuterol (PROVENTIL HFA;VENTOLIN HFA)  108 (90 BASE) MCG/ACT inhaler Inhale 2 puffs into the lungs every 6 (six) hours as needed for wheezing.    Historical Provider, MD  amLODipine (NORVASC) 5 MG tablet Take 5 mg by mouth  daily.    Historical Provider, MD  Cholecalciferol (VITAMIN D) 2000 UNITS tablet Take 2,000 Units by mouth daily.    Historical Provider, MD  estrogens, conjugated, (PREMARIN) 0.45 MG tablet Take 0.45 mg by mouth daily. Take daily for 21 days then do not take for 7 days.    Historical Provider, MD  fexofenadine (ALLEGRA) 180 MG tablet Take 180 mg by mouth daily.    Historical Provider, MD  levothyroxine (SYNTHROID, LEVOTHROID) 88 MCG tablet Take 88 mcg by mouth daily before breakfast.    Historical Provider, MD  magnesium oxide (MAG-OX) 400 MG tablet Take 400 mg by mouth daily.    Historical Provider, MD  metoprolol (TOPROL-XL) 200 MG 24 hr tablet Take 200 mg by mouth daily.    Historical Provider, MD  Multiple Vitamin (MULTIVITAMIN WITH MINERALS) TABS Take 1 tablet by mouth daily.    Historical Provider, MD  omeprazole (PRILOSEC) 20 MG capsule Take 20 mg by mouth daily.    Historical Provider, MD  potassium chloride (K-DUR,KLOR-CON) 10 MEQ tablet Take 10 mEq by mouth daily.    Historical Provider, MD  valsartan-hydrochlorothiazide (DIOVAN-HCT) 320-12.5 MG per tablet Take 1 tablet by mouth daily.    Historical Provider, MD  vitamin C (ASCORBIC ACID) 500 MG tablet Take 500 mg by mouth daily.    Historical Provider, MD  VITAMIN E PO Take 500 Units by mouth daily.    Historical Provider, MD    Allergies:   Allergies  Allergen Reactions  . Hydrocodone     hallucinations  . Oxycodone     hallucinations  . Zoloft [Sertraline Hcl]     Burns stomach  . Ciprofloxacin Swelling  . Sulfa Antibiotics     Blisters in mouth  . Tylenol [Acetaminophen]     headaches  . Ultram [Tramadol]     headaches    Social History:  reports that she quit smoking about 18 years ago. Her smoking use included Cigarettes. She has a 30 pack-year smoking history. She has never used smokeless tobacco. She reports that she does not drink alcohol or use illicit drugs.  Family History: Family History  Problem  Relation Age of Onset  . Thalassemia Son   . Thalassemia Paternal Aunt     Physical Exam: Blood pressure 99/42, pulse 91, temperature 98.7 F (37.1 C), temperature source Oral, resp. rate 17, height 5\' 2"  (1.575 m), weight 85 kg (187 lb 6.3 oz), SpO2 95.00%. General: Alert, awake, oriented x3, in no acute distress. family at the bedside, her confusion has improved  HEENT: normocephalic, atraumatic, anicteric sclera, pink conjunctiva, pupils equal and reactive to light and accomodation, oropharynx clear, dry mucosal membranes  Neck: supple, no masses or lymphadenopathy, no goiter, no bruits  Heart: Regular rate and rhythm, without murmurs, rubs or gallops. Lungs: decreased breath sound at the bases  Abdomen: Soft, nontender, nondistended, positive bowel sounds, no masses. Extremities: No clubbing, cyanosis or edema with positive pedal pulses. Neuro: Grossly intact, no focal neurological deficits, strength 5/5 upper and lower extremities bilaterally Psych: alert and oriented x 3, normal mood and affect Skin: no rashes or lesions, warm and dry   LABS on Admission:    CBC showed white count of 18.2K, hemoglobin 10.5 hematocrit 33.8 platelets 375 with neutrophils 86.5% troponin less  than 0.02 Sodium 136 potassium 4.4, BUN 18, creatinine 1.1 UA positive for UTI  Radiological Exams on Admission: Chest x-ray showed right basilar infiltrate   Assessment/Plan Principal Problem:   Sepsis with hypotension, fever, leukocytosis, tachycardia secondary to HCAP and UTI - Admit to step down, obtain blood cultures, influenza PCR, urine legionella antigen, urine strep antigen, urine culture - Obtain lactic acid, procalcitonin, TSH - Placed on IV vancomycin and cefepime per HCAP protocol which should cover a urinary tract infection as well - Aggressive IV fluid hydration  Active Problems:   Hypotension, unspecified: Patient has a history of hypertension on antihypertensives at home - Hold all  antihypertensives, aggressive IV fluid hydration - If needed, we'll place on vasopressors    HCAP (healthcare-associated pneumonia) -  as #1    UTI (urinary tract infection) - As #1, patient has history of recurrent UTIs, follows Dr. Wendy Poet from [facility], she takes nitrofuantoin antibiotic which will be on hold for now.    Unspecified hypothyroidism - Obtain TSH, continue Synthroid    GERD (gastroesophageal reflux disease) - Continue PPI  DVT prophylaxis:  Lovenox   CODE STATUS: LIMITED CODE, I discussed in detail with the patient, she opted to have the vasopressors and antiarrhythmics if needed however no CPR or intubation per her wishes.   Family Communication: Admission, patients condition and plan of care including tests being ordered have been discussed with the patient, son and daughter who indicates understanding and agree with the plan and Code Status   Further plan will depend as patient's clinical course evolves and further radiologic and laboratory data become available.   Time Spent on Admission: 1 hour   Ripudeep Krystal Eaton M.D. [facility] 01/27/2014, 9:29 PM Pager: [phone]  If 7PM-7AM, please contact night-coverage [email or web address] Password [email or web address]  **Disclaimer: This note was dictated with voice recognition software. Similar sounding words can inadvertently be transcribed and this note may contain transcription errors which may not have been corrected upon publication of note.**

## 2014-01-27 NOTE — Progress Notes (Signed)
ANTIBIOTIC CONSULT NOTE - INITIAL  Pharmacy Consult for vancomycin/cefepime (pharmacy may adjust antibiotics for renal function) Indication: rule out pneumonia  Allergies  Allergen Reactions  . Hydrocodone     hallucinations  . Oxycodone     hallucinations  . Zoloft [Sertraline Hcl]     Burns stomach  . Ciprofloxacin Swelling  . Sulfa Antibiotics     Blisters in mouth  . Tylenol [Acetaminophen]     headaches  . Ultram [Tramadol]     headaches    Patient Measurements: Height: 5\' 2"  (157.5 cm) Weight: 187 lb 6.3 oz (85 kg) IBW/kg (Calculated) : 50.1 Adjusted Body Weight:   Vital Signs: Temp: 98.7 F (37.1 C) (04/13 2050) Temp src: Oral (04/13 2050) BP: 99/42 mmHg (04/13 2055) Pulse Rate: 91 (04/13 2055) Intake/Output from previous day:   Intake/Output from this shift:    Labs: No results found for this basename: WBC, HGB, PLT, LABCREA, CREATININE,  in the last 72 hours Estimated Creatinine Clearance: 56.3 ml/min (by C-G formula based on Cr of 0.82). No results found for this basename: VANCOTROUGH, VANCOPEAK, VANCORANDOM, GENTTROUGH, GENTPEAK, GENTRANDOM, TOBRATROUGH, TOBRAPEAK, TOBRARND, AMIKACINPEAK, AMIKACINTROU, AMIKACIN,  in the last 72 hours   Microbiology: No results found for this or any previous visit (from the past 720 hour(s)).  Medical History: Past Medical History  Diagnosis Date  . Hypertension   . Hypothyroidism   . Depression   . Shortness of breath   . Asthma   . Peripheral vascular disease     legs  . GERD (gastroesophageal reflux disease)   . Headache(784.0)   . Cancer     melanoma on back  . Anemia     hx Thalassemia minor anemia  . Tendinitis of both rotator cuffs   . Chronic back pain     ESI/uses BC powders   Assessment: 81 YOF transferred from [place] with SOB, coughing, and fevers x 2 weeks. Orders to start vancomycin and cefepime. CXR with possible RLL infiltrate.  Vancomycin 1gm, ceftriaxone, azithromycin given at [facility]  ED  4/13 >> vancomycin  >> 4/13 >> cefepime  >>    Tmax: WBCs: 18.2 Renal: SCr = 1.16 for est CrCl 7ml/min, 35ml/min (N)  4/13 blood: pending 4/13 urine: pending 4/13 sputum: ordered 4/13 S. pneumo Ag: 4/13  Legionella Ag: 4/13 influenza:   Goal of Therapy:  Vancomycin trough level 15-20 mcg/ml  Plan:   Vancomycin 750mg  IV q12h  Monitor renal function and check level as indicated  Cefepime 1gm IV q8h  Doreene Eland, PharmD, BCPS.   Pager: [phone]  01/27/2014,9:37 PM

## 2014-01-28 ENCOUNTER — Inpatient Hospital Stay (HOSPITAL_COMMUNITY): Payer: Medicare Other

## 2014-01-28 ENCOUNTER — Encounter (HOSPITAL_COMMUNITY): Payer: Self-pay | Admitting: Radiology

## 2014-01-28 DIAGNOSIS — R509 Fever, unspecified: Secondary | ICD-10-CM

## 2014-01-28 DIAGNOSIS — K56609 Unspecified intestinal obstruction, unspecified as to partial versus complete obstruction: Secondary | ICD-10-CM

## 2014-01-28 LAB — BASIC METABOLIC PANEL WITH GFR
BUN: 13 mg/dL (ref 6–23)
CO2: 23 meq/L (ref 19–32)
Calcium: 7.8 mg/dL — ABNORMAL LOW (ref 8.4–10.5)
Chloride: 103 meq/L (ref 96–112)
Creatinine, Ser: 0.94 mg/dL (ref 0.50–1.10)
GFR calc Af Amer: 65 mL/min — ABNORMAL LOW
GFR calc non Af Amer: 56 mL/min — ABNORMAL LOW
Glucose, Bld: 145 mg/dL — ABNORMAL HIGH (ref 70–99)
Potassium: 3.6 meq/L — ABNORMAL LOW (ref 3.7–5.3)
Sodium: 138 meq/L (ref 137–147)

## 2014-01-28 LAB — INFLUENZA PANEL BY PCR (TYPE A & B)
H1N1FLUPCR: NOT DETECTED
INFLBPCR: NEGATIVE
Influenza A By PCR: NEGATIVE

## 2014-01-28 LAB — CBC
HCT: 29.5 % — ABNORMAL LOW (ref 36.0–46.0)
Hemoglobin: 9.6 g/dL — ABNORMAL LOW (ref 12.0–15.0)
MCH: 20.6 pg — ABNORMAL LOW (ref 26.0–34.0)
MCHC: 32.5 g/dL (ref 30.0–36.0)
MCV: 63.4 fL — ABNORMAL LOW (ref 78.0–100.0)
Platelets: 355 10*3/uL (ref 150–400)
RBC: 4.65 MIL/uL (ref 3.87–5.11)
RDW: 15.1 % (ref 11.5–15.5)
WBC: 16.3 10*3/uL — ABNORMAL HIGH (ref 4.0–10.5)

## 2014-01-28 LAB — LEGIONELLA ANTIGEN, URINE: Legionella Antigen, Urine: NEGATIVE

## 2014-01-28 LAB — TSH: TSH: 7.37 u[IU]/mL — ABNORMAL HIGH (ref 0.350–4.500)

## 2014-01-28 LAB — T4, FREE: Free T4: 0.74 ng/dL — ABNORMAL LOW (ref 0.80–1.80)

## 2014-01-28 LAB — STREP PNEUMONIAE URINARY ANTIGEN: Strep Pneumo Urinary Antigen: NEGATIVE

## 2014-01-28 LAB — CLOSTRIDIUM DIFFICILE BY PCR: Toxigenic C. Difficile by PCR: NEGATIVE

## 2014-01-28 LAB — HEMOGLOBIN A1C
HEMOGLOBIN A1C: 6 % — AB (ref <5.7)
Mean Plasma Glucose: 126 mg/dL — ABNORMAL HIGH (ref <117)

## 2014-01-28 MED ORDER — ENSURE COMPLETE PO LIQD
237.0000 mL | Freq: Two times a day (BID) | ORAL | Status: DC
  Administered 2014-01-28: 237 mL via ORAL

## 2014-01-28 MED ORDER — IOHEXOL 300 MG/ML  SOLN
100.0000 mL | Freq: Once | INTRAMUSCULAR | Status: AC | PRN
  Administered 2014-01-28: 100 mL via INTRAVENOUS

## 2014-01-28 MED ORDER — FENTANYL CITRATE 0.05 MG/ML IJ SOLN
25.0000 ug | INTRAMUSCULAR | Status: DC | PRN
  Administered 2014-01-28 (×2): 25 ug via INTRAVENOUS
  Administered 2014-01-28 – 2014-01-29 (×3): 50 ug via INTRAVENOUS
  Administered 2014-01-30 (×2): 25 ug via INTRAVENOUS
  Filled 2014-01-28 (×8): qty 2

## 2014-01-28 MED ORDER — IOHEXOL 300 MG/ML  SOLN
25.0000 mL | INTRAMUSCULAR | Status: AC
  Administered 2014-01-28 (×2): 25 mL via ORAL

## 2014-01-28 MED ORDER — KETOROLAC TROMETHAMINE 15 MG/ML IJ SOLN
15.0000 mg | Freq: Four times a day (QID) | INTRAMUSCULAR | Status: DC | PRN
  Administered 2014-01-28: 15 mg via INTRAVENOUS
  Filled 2014-01-28: qty 1

## 2014-01-28 MED ORDER — METRONIDAZOLE IN NACL 5-0.79 MG/ML-% IV SOLN
500.0000 mg | Freq: Three times a day (TID) | INTRAVENOUS | Status: DC
  Administered 2014-01-28 – 2014-02-02 (×13): 500 mg via INTRAVENOUS
  Filled 2014-01-28 (×17): qty 100

## 2014-01-28 MED ORDER — FENTANYL CITRATE 0.05 MG/ML IJ SOLN
12.5000 ug | INTRAMUSCULAR | Status: DC | PRN
  Administered 2014-01-28 (×2): 12.5 ug via INTRAVENOUS
  Filled 2014-01-28 (×2): qty 2

## 2014-01-28 MED ORDER — KETOROLAC TROMETHAMINE 30 MG/ML IJ SOLN
30.0000 mg | Freq: Once | INTRAMUSCULAR | Status: AC
  Administered 2014-01-28: 30 mg via INTRAVENOUS
  Filled 2014-01-28: qty 1

## 2014-01-28 NOTE — Progress Notes (Signed)
Consult reviewed.  Chart reviewed Full consult to follow in am Ct reviewed.  Pt interviewed at bedside with daughter. Had urology instrumentation about 1 month ago to evaluate for recurring UTIs/incontinence. No other recent invasive procedures.  Says 30 yrs ago when had hysterectomy they made a hole in bladder which was repaired. Pt denies passing air thru vagina/when urinating  Nontoxic appearing,  Soft, obese, mild TTP in bl lower abd, no RT/guarding  Pelvic abscesses - Unclear etiology - doesn't look related to bowel, perf diveritculitis. ?bladder or vag related rec consult IR in AM and see if amenable to perc drain Will need to speak with [facility] tomorrow to see what they found on recent wu. Nature of output from drain will help steer addl workup/evaluation Clears tonight; NPO after midnight.  Cont IV abx   Leighton Ruff. Redmond Pulling, MD, FACS General, Bariatric, & Minimally Invasive Surgery [facility], [place]

## 2014-01-28 NOTE — Progress Notes (Addendum)
[facility] PROGRESS NOTE  Laura Frey TKZ:[id number] DOB: 1932/06/04 DOA: 01/27/2014 PCP: No PCP Per Patient  Assessment/Plan: 1. Sepsis -being treated for HCAP +/- UTI -lactic acid and pro calcitonin normal -Continue Broad spectrum Abx -will repeat CXr in our system -FU cultures from [place] -given Increasing Abd pain, tenderness will check CT Abd pelvis -Cdiff PCR negative  2. HTN -home anti-hypertensive's on Hold -BP improving  3. Hypothyroidism -continue Synthroid, TSH up, check T4  4. HCAP +/- UTI -see #1  5. GERD -continue PPI  6. Cervical spondylitic myelopathy from severe cervical stenosis   -h/o  anterior cervical discectomies and fusion 7/14  7. Chronic anemia/ -h/o Thalassemia, stable  8. Hyperglycemia -check hbaic  DVT proph: lovenox  Code Status: PARTIAL CODE-No CPR, NO intubation ok with vasopressors and antiarrhythmics  Family Communication: no family at bedside Disposition Plan: transfer to tele   Antibiotics:  Vanc/cefepime  HPI/Subjective: Having abd pain and diarrhea, breathing ok  Objective: Filed Vitals:   01/28/14 0800  BP:   Pulse:   Temp: 97.8 F (36.6 C)  Resp:     Intake/Output Summary (Last 24 hours) at 01/28/14 1153 Last data filed at 01/28/14 0600  Gross per 24 hour  Intake   1450 ml  Output   1040 ml  Net    410 ml   Filed Weights   01/27/14 2050  Weight: 85 kg (187 lb 6.3 oz)    Exam:   General:  AAOx3, uncomfortable appearing  Cardiovascular: Scattered ronchi  Abdomen: soft, Mild peri-umbilical and lower quadrants, BS present but diminished  Musculoskeletal: no edema c/c   Data Reviewed: Basic Metabolic Panel:  Recent Labs Lab 01/27/14 2239 01/28/14 0324  NA  --  138  K  --  3.6*  CL  --  103  CO2  --  23  GLUCOSE  --  145*  BUN  --  13  CREATININE 0.99 0.94  CALCIUM  --  7.8*   Liver Function Tests: No results found for this basename: AST, ALT, ALKPHOS, BILITOT, PROT,  ALBUMIN,  in the last 168 hours No results found for this basename: LIPASE, AMYLASE,  in the last 168 hours No results found for this basename: AMMONIA,  in the last 168 hours CBC:  Recent Labs Lab 01/27/14 2239 01/28/14 0324  WBC 16.1* 16.3*  HGB 8.9* 9.6*  HCT 27.4* 29.5*  MCV 63.3* 63.4*  PLT 306 355   Cardiac Enzymes: No results found for this basename: CKTOTAL, CKMB, CKMBINDEX, TROPONINI,  in the last 168 hours BNP (last 3 results) No results found for this basename: PROBNP,  in the last 8760 hours CBG: No results found for this basename: GLUCAP,  in the last 168 hours  Recent Results (from the past 240 hour(s))  MRSA PCR SCREENING     Status: None   Collection Time    01/27/14  8:56 PM      Result Value Ref Range Status   MRSA by PCR NEGATIVE  NEGATIVE Final   Comment:            The GeneXpert MRSA Assay (FDA     approved for NASAL specimens     only), is one component of a     comprehensive MRSA colonization     surveillance program. It is not     intended to diagnose MRSA     infection nor to guide or     monitor treatment for     MRSA infections.  CLOSTRIDIUM DIFFICILE BY PCR     Status: None   Collection Time    01/28/14  4:36 AM      Result Value Ref Range Status   C difficile by pcr NEGATIVE  NEGATIVE Final   Comment: Performed at [facility]     Studies: Dg Chest 2 View  01/28/2014   CLINICAL DATA:  Shortness of breath and cough.  History of melanoma.  EXAM: CHEST  2 VIEW  COMPARISON:  01/27/2014 and 04/29/2013  FINDINGS: Lungs are adequately inflated and demonstrate mild worsening consolidation over the right central mid to lower lung there is a small amount of pleural fluid posteriorly in the lung bases. There is suggestion mild vascular congestion. Cardiomediastinal silhouette and remainder of the exam is unchanged.  IMPRESSION: Worsening opacification of the central right mid to lower lung likely infection. Small amount of pleural fluid  posteriorly. Suggestion mild vascular congestion.   Electronically Signed   By: Marin Olp M.D.   On: 01/28/2014 11:51    Scheduled Meds: . ceFEPime (MAXIPIME) IV  1 g Intravenous 3 times per day  . cholecalciferol  2,000 Units Oral Daily  . enoxaparin (LOVENOX) injection  40 mg Subcutaneous Q24H  . estrogens (conjugated)  0.45 mg Oral Daily  . feeding supplement (ENSURE COMPLETE)  237 mL Oral BID BM  . levothyroxine  75 mcg Oral QAC breakfast  . loratadine  10 mg Oral Daily  . magnesium oxide  400 mg Oral Daily  . multivitamin with minerals  1 tablet Oral Daily  . pantoprazole  40 mg Oral Daily  . vancomycin  750 mg Intravenous Q12H  . vitamin C  500 mg Oral Daily   Continuous Infusions: . sodium chloride 125 mL/hr at 01/28/14 0510   Antibiotics Given (last 72 hours)   Date/Time Action Medication Dose Rate   01/27/14 2310 Given   ceFEPIme (MAXIPIME) 1 g in dextrose 5 % 50 mL IVPB 1 g 100 mL/hr   01/28/14 0236 Given   vancomycin (VANCOCIN) IVPB 750 mg/150 ml premix 750 mg 150 mL/hr   01/28/14 5784 Given   ceFEPIme (MAXIPIME) 1 g in dextrose 5 % 50 mL IVPB 1 g 100 mL/hr   01/28/14 1121 Given   vancomycin (VANCOCIN) IVPB 750 mg/150 ml premix 750 mg 150 mL/hr      Principal Problem:   Sepsis Active Problems:   Hypotension, unspecified   HCAP (healthcare-associated pneumonia)   UTI (urinary tract infection)   Unspecified hypothyroidism   GERD (gastroesophageal reflux disease)    Time spent: 66min    [facility] Pager [phone]. If 7PM-7AM, please contact night-coverage at [email or web address], password [facility] 01/28/2014, 11:53 AM  LOS: 1 day

## 2014-01-28 NOTE — Progress Notes (Signed)
CARE MANAGEMENT NOTE 01/28/2014  Patient:  Laura Frey, Laura Frey   Account Number:  [id number]  Date Initiated:  01/28/2014  Documentation initiated by:  Ronnie Mallette  Subjective/Objective Assessment:   sepsis, poss high risk for intubation and requirement of pressors     Action/Plan:   home when stable   Anticipated DC Date:  01/31/2014   Anticipated DC Plan:  HOME/SELF CARE  In-house referral  NA      DC Planning Services  NA      PAC Choice  NA  NA   Choice offered to / List presented to:  NA   DME arranged  NA      DME agency  NA     [place] arranged  NA      [place] agency  NA   Status of service:  In process, will continue to follow Medicare Important Message given?  NA - LOS <3 / Initial given by admissions (If response is "NO", the following Medicare IM given date fields will be blank) Date Medicare IM given:   Date Additional Medicare IM given:    Discharge Disposition:    Per UR Regulation:  Reviewed for med. necessity/level of care/duration of stay  If discussed at [place] of Stay Meetings, dates discussed:    Comments:  [id number]/Beecher Furio Eldridge Dace, [place], [place] [phone] Chart Reviewed for discharge and hospital needs. Discharge needs at time of review: None present will follow for needs. Review of patient progress due on [id number].

## 2014-01-28 NOTE — Progress Notes (Addendum)
Addendum: Called with CT Abd pelvis this evening which is significantly abnormal, 3 fluid collections/abscesses, and PSBO vs Ileus -Surgical consult requested -Npo, Continue IVF, Vanc/Cefepime, add Flagyl -needs drainage of abscesses-Open vs Perc drains per IR in am -d/w family at [place], [employer]

## 2014-01-28 NOTE — Progress Notes (Signed)
PT Cancellation Note  Patient Details Name: Laura Frey MRN: [id number] DOB: 1932-06-30   Cancelled Treatment:    Reason Eval/Treat Not Completed: Patient at procedure or test/unavailable   Junius Argyle 01/28/2014, 11:00 AM Carmelia Bake, PT, DPT 01/28/2014 Pager: [phone]

## 2014-01-28 NOTE — Progress Notes (Signed)
INITIAL NUTRITION ASSESSMENT  DOCUMENTATION CODES Per approved criteria  -Obesity Unspecified   INTERVENTION: - Ensure Complete BID - Educated pt on high calorie/protein diet therapy and discussed protein rich foods/beverages/nutritional supplements to help prevent lean muscle loss - Will continue to monitor   NUTRITION DIAGNOSIS: Inadequate oral intake related to poor appetite as evidenced by pt report.   Goal: Pt to consume >90% of meals/supplements  Monitor:  Weights, labs, intake  Reason for Assessment: Malnutrition screening tool   81 y.o. female  Admitting Dx: Sepsis  ASSESSMENT: Pt with history of hypertension, hypothyroidism, recurrent UTIs, GERD initially presented to [facility] ER with coughing, shortness of breath and fevers. Patient required step down care for possible sepsis hence transferred to [place] [facility] for further care.   Met with pt who reports poor appetite for the past 2 weeks since she started getting sick. Thought she lost 10 pounds during this time frame, however weight has been stable. States she was snacking on things like poptarts and tomato sandwiches, ate some potatoes, and drank 3 cups of buttermilk/day. Also was drinking V8 juice. Hx of neck surgery last summer and reports she cannot walk and states she is getting therapy for this. C/o feeling weak.   Potassium low, getting multivitamin with minerals   Nutrition Focused Physical Exam:  Subcutaneous Fat:  Orbital Region: WNL Upper Arm Region: mild muscle wasting Thoracic and Lumbar Region: WNL  Muscle:  Temple Region: WNL Clavicle Bone Region: WNL Clavicle and Acromion Bone Region: WNL Scapular Bone Region: NA Dorsal Hand: WNL Patellar Region: WNL Anterior Thigh Region: WNL Posterior Calf Region: WNL  Edema: None noted   Height: Ht Readings from Last 1 Encounters:  01/27/14 5' 2" (1.575 m)    Weight: Wt Readings from Last 1 Encounters:  01/27/14 187 lb 6.3 oz  (85 kg)    Ideal Body Weight: 110 lbs  % Ideal Body Weight: 170%  Wt Readings from Last 10 Encounters:  01/27/14 187 lb 6.3 oz (85 kg)  05/01/13 184 lb 1.4 oz (83.5 kg)  05/01/13 184 lb 1.4 oz (83.5 kg)  04/29/13 183 lb (83.008 kg)    Usual Body Weight: 187 lbs per pt  % Usual Body Weight: 100%  BMI:  Body mass index is 34.27 kg/(m^2). Class I obesity  Estimated Nutritional Needs: Kcal: 1300-1500 Protein: 60-80g Fluid: 1.3-1.5L/day  Skin: WNL  Diet Order: Cardiac  EDUCATION NEEDS: -No education needs identified at this time   Intake/Output Summary (Last 24 hours) at 01/28/14 0949 Last data filed at 01/28/14 0600  Gross per 24 hour  Intake   1450 ml  Output   1040 ml  Net    410 ml    Last BM: 4/14  Labs:   Recent Labs Lab 01/27/14 2239 01/28/14 0324  NA  --  138  K  --  3.6*  CL  --  103  CO2  --  23  BUN  --  13  CREATININE 0.99 0.94  CALCIUM  --  7.8*  GLUCOSE  --  145*    CBG (last 3)  No results found for this basename: GLUCAP,  in the last 72 hours  Scheduled Meds: . ceFEPime (MAXIPIME) IV  1 g Intravenous 3 times per day  . cholecalciferol  2,000 Units Oral Daily  . enoxaparin (LOVENOX) injection  40 mg Subcutaneous Q24H  . estrogens (conjugated)  0.45 mg Oral Daily  . levothyroxine  75 mcg Oral QAC breakfast  . loratadine    10 mg Oral Daily  . magnesium oxide  400 mg Oral Daily  . multivitamin with minerals  1 tablet Oral Daily  . pantoprazole  40 mg Oral Daily  . vancomycin  750 mg Intravenous Q12H  . vitamin C  500 mg Oral Daily    Continuous Infusions: . sodium chloride 125 mL/hr at 01/28/14 0510    Past Medical History  Diagnosis Date  . Hypertension   . Hypothyroidism   . Depression   . Shortness of breath   . Asthma   . Peripheral vascular disease     legs  . GERD (gastroesophageal reflux disease)   . Headache(784.0)   . Cancer     melanoma on back  . Anemia     hx Thalassemia minor anemia  . Tendinitis of  both rotator cuffs   . Chronic back pain     ESI/uses BC powders    Past Surgical History  Procedure Laterality Date  . Tubal ligation    . Appendectomy    . Tonsillectomy    . Eye surgery Bilateral     cataracts  . Abdominal hysterectomy      vagina  . Bladder repair      after hysterectomy same day  . Anterior cervical decomp/discectomy fusion N/A 05/01/2013    Procedure: ANTERIOR CERVICAL DECOMPRESSION/DISCECTOMY FUSION 3 LEVELS;  Surgeon: Gary P Cram, MD;  Location: [facility];  Service: Neurosurgery;  Laterality: N/A;  ANTERIOR CERVICAL DECOMPRESSION/DISCECTOMY FUSION 3 LEVELS    Heather Baron MS, RD, LDN [phone] Pager [phone] After Hours Pager  

## 2014-01-29 ENCOUNTER — Inpatient Hospital Stay (HOSPITAL_COMMUNITY): Payer: Medicare Other

## 2014-01-29 LAB — CREATININE, FLUID (PLEURAL, PERITONEAL, JP DRAINAGE): Creat, Fluid: 1 mg/dL

## 2014-01-29 LAB — CBC
HEMATOCRIT: 29 % — AB (ref 36.0–46.0)
Hemoglobin: 9.6 g/dL — ABNORMAL LOW (ref 12.0–15.0)
MCH: 21.3 pg — ABNORMAL LOW (ref 26.0–34.0)
MCHC: 33.1 g/dL (ref 30.0–36.0)
MCV: 64.3 fL — AB (ref 78.0–100.0)
Platelets: 356 10*3/uL (ref 150–400)
RBC: 4.51 MIL/uL (ref 3.87–5.11)
RDW: 15.2 % (ref 11.5–15.5)
WBC: 28.1 10*3/uL — ABNORMAL HIGH (ref 4.0–10.5)

## 2014-01-29 LAB — URINE CULTURE
CULTURE: NO GROWTH
Colony Count: NO GROWTH

## 2014-01-29 LAB — APTT: aPTT: 39 seconds — ABNORMAL HIGH (ref 24–37)

## 2014-01-29 LAB — BASIC METABOLIC PANEL
BUN: 10 mg/dL (ref 6–23)
CO2: 22 meq/L (ref 19–32)
CREATININE: 0.96 mg/dL (ref 0.50–1.10)
Calcium: 7.5 mg/dL — ABNORMAL LOW (ref 8.4–10.5)
Chloride: 103 mEq/L (ref 96–112)
GFR calc Af Amer: 64 mL/min — ABNORMAL LOW (ref >90)
GFR calc non Af Amer: 55 mL/min — ABNORMAL LOW (ref >90)
Glucose, Bld: 127 mg/dL — ABNORMAL HIGH (ref 70–99)
Potassium: 3.3 mEq/L — ABNORMAL LOW (ref 3.7–5.3)
Sodium: 138 mEq/L (ref 137–147)

## 2014-01-29 LAB — PROTIME-INR
INR: 1.31 (ref 0.00–1.49)
Prothrombin Time: 16 seconds — ABNORMAL HIGH (ref 11.6–15.2)

## 2014-01-29 LAB — PROCALCITONIN: Procalcitonin: 1.86 ng/mL

## 2014-01-29 LAB — LACTIC ACID, PLASMA: LACTIC ACID, VENOUS: 1.3 mmol/L (ref 0.5–2.2)

## 2014-01-29 MED ORDER — SODIUM CHLORIDE 0.9 % IV SOLN: INTRAVENOUS | Status: DC

## 2014-01-29 MED ORDER — MIDAZOLAM HCL 2 MG/2ML IJ SOLN
INTRAMUSCULAR | Status: AC
  Filled 2014-01-29: qty 4

## 2014-01-29 MED ORDER — FENTANYL CITRATE 0.05 MG/ML IJ SOLN
INTRAMUSCULAR | Status: AC
  Filled 2014-01-29: qty 4

## 2014-01-29 MED ORDER — MIDAZOLAM HCL 2 MG/2ML IJ SOLN
INTRAMUSCULAR | Status: AC | PRN
  Administered 2014-01-29: 1 mg via INTRAVENOUS

## 2014-01-29 MED ORDER — METOPROLOL TARTRATE 1 MG/ML IV SOLN
2.5000 mg | Freq: Four times a day (QID) | INTRAVENOUS | Status: DC
  Administered 2014-01-29 (×2): 2.5 mg via INTRAVENOUS
  Administered 2014-01-30 – 2014-01-31 (×2): 5 mg via INTRAVENOUS
  Filled 2014-01-29 (×9): qty 5

## 2014-01-29 MED ORDER — FENTANYL CITRATE 0.05 MG/ML IJ SOLN
INTRAMUSCULAR | Status: AC | PRN
  Administered 2014-01-29 (×2): 25 ug via INTRAVENOUS

## 2014-01-29 NOTE — Progress Notes (Signed)
Patient ID: Laura Frey, female   DOB: 03/22/1932, 80 y.o.   MRN: [id number] Request received for CT guided drainage of pelvic fluid collection/? abscess in pt  admitted with sepsis syndrome, dyspnea/cough, abd pain, recent cystoscopy (hx recurrent UTI's/bladder repair 1978) and CT revealing inflammatory pelvic process with scattered fluid collections. Additional PMH as below. Imaging studies have been reviewed  by Dr. Barbie Banner and he feels that collection in right aspect of post cul de sac is amenable to drainage via TG approach. Exam: pt awake, mildly confused; chest- dim BS bases; heart- tachy with occ ectopy; abd- mildly dist., lower pelvic tenderness, primarily in RLQ; ext- FROM, no sig edema.   Filed Vitals:   01/29/14 0016 01/29/14 0400 01/29/14 0800 01/29/14 0900  BP:  125/36 136/47   Pulse:  113 123 109  Temp: 98.2 F (36.8 C)     TempSrc: Oral     Resp:  21 25 17   Height:      Weight:      SpO2:  96% 96% 96%   Past Medical History  Diagnosis Date  . Hypertension   . Hypothyroidism   . Depression   . Shortness of breath   . Asthma   . Peripheral vascular disease     legs  . GERD (gastroesophageal reflux disease)   . Headache(784.0)   . Cancer     melanoma on back  . Anemia     hx Thalassemia minor anemia  . Tendinitis of both rotator cuffs   . Chronic back pain     ESI/uses BC powders   Past Surgical History  Procedure Laterality Date  . Tubal ligation    . Appendectomy    . Tonsillectomy    . Eye surgery Bilateral     cataracts  . Abdominal hysterectomy      vagina  . Bladder repair      after hysterectomy same day  . Anterior cervical decomp/discectomy fusion N/A 05/01/2013    Procedure: ANTERIOR CERVICAL DECOMPRESSION/DISCECTOMY FUSION 3 LEVELS;  Surgeon: Elaina Hoops, MD;  Location: [facility];  Service: Neurosurgery;  Laterality: N/A;  ANTERIOR CERVICAL DECOMPRESSION/DISCECTOMY FUSION 3 LEVELS   Dg Chest 2 View  01/28/2014   CLINICAL DATA:  Shortness of  breath and cough.  History of melanoma.  EXAM: CHEST  2 VIEW  COMPARISON:  01/27/2014 and 04/29/2013  FINDINGS: Lungs are adequately inflated and demonstrate mild worsening consolidation over the right central mid to lower lung there is a small amount of pleural fluid posteriorly in the lung bases. There is suggestion mild vascular congestion. Cardiomediastinal silhouette and remainder of the exam is unchanged.  IMPRESSION: Worsening opacification of the central right mid to lower lung likely infection. Small amount of pleural fluid posteriorly. Suggestion mild vascular congestion.   Electronically Signed   By: Marin Olp M.D.   On: 01/28/2014 11:51   Ct Abdomen Pelvis W Contrast  01/28/2014   CLINICAL DATA:  Sepsis, abdominal pain leukocytosis  EXAM: CT ABDOMEN AND PELVIS WITH CONTRAST  TECHNIQUE: Multidetector CT imaging of the abdomen and pelvis was performed using the standard protocol following bolus administration of intravenous contrast.  CONTRAST:  164mL OMNIPAQUE IOHEXOL 300 MG/ML  SOLN  COMPARISON:  Not available  FINDINGS: Bilateral pleural effusions and mild basilar atelectasis. No pericardial fluid.  No focal hepatic lesion. The gallbladder, pancreas, spleen, adrenal glands, kidneys are normal.  The stomach is normal. There is small hiatal hernia. The proximal small bowel is mildly dilated  to 3 cm. There is poor progression of the oral contrast. The more distal small bowel leading to the terminal ileum is decompressed compared to the proximal small bowel measuring approximately 1 cm diameter. No cleartransition point is identified.  Within the deep pelvisthere are 2 adjacent ovoid fluid collections with rim enhancement positioned posterior to the bladder and anterior to the rectum measuring 3.4 x 2.2 cm and 2.4 x 2.6 cm. These are felt to represent small abscess rather than loops of bowel or ovaries. Posterior within the deep pelvis a second fluid collection measuring 3.5 x 3.3 cm in the right  aspect the posterior cul-de-sac.  There is small amount of gas along the left broad ligament (image 58). Second focus of gas in the region of the right adnexa/broad ligament. (image 60). There several diverticula of the sigmoid colon pelvis appears relatively normal.  Abdominal or is normal caliber. No retroperitoneal periportal lymphadenopathy.  Bladder appears normal. No evidence of gas bladder. Post hysterectomy anatomy. The rectum appears normal.  There is no free air in the upper abdomen. Tiny focus of gas in the umbilicus.  IMPRESSION: 1. Inflammatory process in the pelvis. There are 3 fluid collections with enhancing rims which appear to represent abscesses located between the rectum and the bladder. These could could relate to prior hysterectomy and potential dehiscence of vaginal cuff. Diverticulitis would be a secondary less likely consideration. 2. Small amount of gas and fluid collections in left and right broad ligament / adnexal regions. These are also likely infections associated with same process. 3. Evidence of partial small bowel obstruction versus ileus. This likely relates to secondary inflammation of the small bowel as it dips into the pelvis. 4. Bilateral pleural effusions and basilar atelectasis. Findings conveyed topatient's ICU nurseon 01/28/2014  at16:31.   Electronically Signed   By: Suzy Bouchard M.D.   On: 01/28/2014 16:34  Results for orders placed during the hospital encounter of 01/27/14  MRSA PCR SCREENING      Result Value Ref Range   MRSA by PCR NEGATIVE  NEGATIVE  CULTURE, BLOOD (ROUTINE X 2)      Result Value Ref Range   Specimen Description BLOOD BLOOD RIGHT FOREARM     Special Requests BOTTLES DRAWN AEROBIC ONLY 8CC     Culture  Setup Time       Value: 01/28/2014 03:56     Performed at [facility]   Culture       Value:        BLOOD CULTURE RECEIVED NO GROWTH TO DATE CULTURE WILL BE HELD FOR 5 DAYS BEFORE ISSUING A FINAL NEGATIVE REPORT     Performed at  [facility]   Report Status PENDING    CULTURE, BLOOD (ROUTINE X 2)      Result Value Ref Range   Specimen Description BLOOD LEFT ANTECUBITAL     Special Requests BOTTLES DRAWN AEROBIC AND ANAEROBIC 10CC     Culture  Setup Time       Value: 01/28/2014 03:56     Performed at [facility]   Culture       Value:        BLOOD CULTURE RECEIVED NO GROWTH TO DATE CULTURE WILL BE HELD FOR 5 DAYS BEFORE ISSUING A FINAL NEGATIVE REPORT     Performed at [facility]   Report Status PENDING    URINE CULTURE      Result Value Ref Range   Specimen Description URINE, [place]  Special Requests NONE     Culture  Setup Time       Value: 01/28/2014 05:28     Performed at [facility]       Value: NO GROWTH     Performed at [facility]   Culture       Value: NO GROWTH     Performed at [facility]   Report Status 01/29/2014 FINAL    CLOSTRIDIUM DIFFICILE BY PCR      Result Value Ref Range   C difficile by pcr NEGATIVE  NEGATIVE  URINALYSIS, ROUTINE W REFLEX MICROSCOPIC      Result Value Ref Range   Color, Urine YELLOW  YELLOW   APPearance CLEAR  CLEAR   Specific Gravity, Urine 1.005  1.005 - 1.030   pH 5.0  5.0 - 8.0   Glucose, UA NEGATIVE  NEGATIVE mg/dL   Hgb urine dipstick NEGATIVE  NEGATIVE   Bilirubin Urine NEGATIVE  NEGATIVE   Ketones, ur NEGATIVE  NEGATIVE mg/dL   Protein, ur NEGATIVE  NEGATIVE mg/dL   Urobilinogen, UA 0.2  0.0 - 1.0 mg/dL   Nitrite NEGATIVE  NEGATIVE   Leukocytes, UA NEGATIVE  NEGATIVE  LEGIONELLA ANTIGEN, URINE      Result Value Ref Range   Specimen Description URINE, CLEAN CATCH     Special Requests NONE     Legionella Antigen, Urine       Value: Negative for Legionella pneumophilia serogroup 1     Performed at [facility]   Report Status 01/28/2014 FINAL    STREP PNEUMONIAE URINARY ANTIGEN      Result Value Ref Range   Strep Pneumo Urinary Antigen NEGATIVE  NEGATIVE  BASIC  METABOLIC PANEL      Result Value Ref Range   Sodium 138  137 - 147 mEq/L   Potassium 3.6 (*) 3.7 - 5.3 mEq/L   Chloride 103  96 - 112 mEq/L   CO2 23  19 - 32 mEq/L   Glucose, Bld 145 (*) 70 - 99 mg/dL   BUN 13  6 - 23 mg/dL   Creatinine, Ser 0.94  0.50 - 1.10 mg/dL   Calcium 7.8 (*) 8.4 - 10.5 mg/dL   GFR calc non Af Amer 56 (*) >90 mL/min   GFR calc Af Amer 65 (*) >90 mL/min  CBC      Result Value Ref Range   WBC 16.3 (*) 4.0 - 10.5 K/uL   RBC 4.65  3.87 - 5.11 MIL/uL   Hemoglobin 9.6 (*) 12.0 - 15.0 g/dL   HCT 29.5 (*) 36.0 - 46.0 %   MCV 63.4 (*) 78.0 - 100.0 fL   MCH 20.6 (*) 26.0 - 34.0 pg   MCHC 32.5  30.0 - 36.0 g/dL   RDW 15.1  11.5 - 15.5 %   Platelets 355  150 - 400 K/uL  CBC      Result Value Ref Range   WBC 16.1 (*) 4.0 - 10.5 K/uL   RBC 4.33  3.87 - 5.11 MIL/uL   Hemoglobin 8.9 (*) 12.0 - 15.0 g/dL   HCT 27.4 (*) 36.0 - 46.0 %   MCV 63.3 (*) 78.0 - 100.0 fL   MCH 20.6 (*) 26.0 - 34.0 pg   MCHC 32.5  30.0 - 36.0 g/dL   RDW 14.8  11.5 - 15.5 %   Platelets 306  150 - 400 K/uL  CREATININE, SERUM      Result  Value Ref Range   Creatinine, Ser 0.99  0.50 - 1.10 mg/dL   GFR calc non Af Amer 53 (*) >90 mL/min   GFR calc Af Amer 61 (*) >90 mL/min  TSH      Result Value Ref Range   TSH 7.370 (*) 0.350 - 4.500 uIU/mL  PROCALCITONIN      Result Value Ref Range   Procalcitonin 0.75    LACTIC ACID, PLASMA      Result Value Ref Range   Lactic Acid, Venous 0.9  0.5 - 2.2 mmol/L  INFLUENZA PANEL BY PCR (TYPE A & B, H1N1)      Result Value Ref Range   Influenza A By PCR NEGATIVE  NEGATIVE   Influenza B By PCR NEGATIVE  NEGATIVE   H1N1 flu by pcr NOT DETECTED  NOT DETECTED  T4, FREE      Result Value Ref Range   Free T4 0.74 (*) 0.80 - 1.80 ng/dL  HEMOGLOBIN A1C      Result Value Ref Range   Hemoglobin A1C 6.0 (*) <5.7 %   Mean Plasma Glucose 126 (*) <117 mg/dL  PROCALCITONIN      Result Value Ref Range   Procalcitonin 1.86    CBC      Result Value Ref Range    WBC 28.1 (*) 4.0 - 10.5 K/uL   RBC 4.51  3.87 - 5.11 MIL/uL   Hemoglobin 9.6 (*) 12.0 - 15.0 g/dL   HCT 29.0 (*) 36.0 - 46.0 %   MCV 64.3 (*) 78.0 - 100.0 fL   MCH 21.3 (*) 26.0 - 34.0 pg   MCHC 33.1  30.0 - 36.0 g/dL   RDW 15.2  11.5 - 15.5 %   Platelets 356  150 - 400 K/uL  BASIC METABOLIC PANEL      Result Value Ref Range   Sodium 138  137 - 147 mEq/L   Potassium 3.3 (*) 3.7 - 5.3 mEq/L   Chloride 103  96 - 112 mEq/L   CO2 22  19 - 32 mEq/L   Glucose, Bld 127 (*) 70 - 99 mg/dL   BUN 10  6 - 23 mg/dL   Creatinine, Ser 0.96  0.50 - 1.10 mg/dL   Calcium 7.5 (*) 8.4 - 10.5 mg/dL   GFR calc non Af Amer 55 (*) >90 mL/min   GFR calc Af Amer 64 (*) >90 mL/min  LACTIC ACID, PLASMA      Result Value Ref Range   Lactic Acid, Venous 1.3  0.5 - 2.2 mmol/L   A/P:Pt with hx of sepsis syndrome, dyspnea/cough, abd pain, recent cystoscopy (hx recurrent UTI's/bladder repair 6237) and CT revealing inflammatory pelvic process with scattered fluid collections. Tent plan is for CT guided drainage of rt post pelvic cul de sac collection/? abscess today. Details/risks of procedure d/w pt/daughter with their understanding and consent.

## 2014-01-29 NOTE — Progress Notes (Signed)
Pt seen and examined.  Some confusion. Obese, soft, very mild suprapubic TTP Drain - straw colored  Pelvic abscess -unclear etiology - appears like simple serous fluid. Cr on fluid was 1. Doesn't appear to be c/w intestinal source. F/u cx from aspiration. Could just be sterile fluid collection.  -reviewed her [facility] chart with their PA - no recent instrumentation. Had urodynamics about 2 months back -cx results will probably steer addl workup  Leighton Ruff. Redmond Pulling, MD, FACS General, Bariatric, & Minimally Invasive Surgery [facility], [place]

## 2014-01-29 NOTE — Procedures (Signed)
R transgluteal abscess drain 12 fr No comp

## 2014-01-29 NOTE — Progress Notes (Signed)
Subjective: She still seems a bit confused.  She says she doesn't feel better, but her stomach doesn't hurt as much.  She isn't sure if her breathing is better or not.  She has generalized abdominal discomfort but seems to localize to the right lower quadrant.  Objective: Vital signs in last 24 hours: Temp:  [97.7 F (36.5 C)-99.1 F (37.3 C)] 98.2 F (36.8 C) (04/15 0016) Pulse Rate:  [106-122] 113 (04/15 0400) Resp:  [18-31] 21 (04/15 0400) BP: (96-145)/(30-93) 125/36 mmHg (04/15 0400) SpO2:  [87 %-98 %] 96 % (04/15 0400) Last BM Date: 01/28/14 PO 240 recorded. +BM x 1 yesterday Diet:  NPO Afebrile,  HR up, RR up , and BP down some last PM Intake/Output from previous day: 04/14 0701 - 04/15 0700 In: 3765 [P.O.:240; I.V.:2875; IV Piggyback:650] Out: 725 [Urine:725] Intake/Output this shift:    General appearance: alert, cooperative, no distress and slightly confused.  Not really sure of all that caused her to be here. Resp: Upper lobes are clear,no wheezing, BS down in both bases. GI: soft, no peritonitis, pain is generalized but she seems most uncomfortable in RLQ  Lab Results:   Recent Labs  01/28/14 0324 01/29/14 0305  WBC 16.3* 28.1*  HGB 9.6* 9.6*  HCT 29.5* 29.0*  PLT 355 356    BMET  Recent Labs  01/28/14 0324 01/29/14 0305  NA 138 138  K 3.6* 3.3*  CL 103 103  CO2 23 22  GLUCOSE 145* 127*  BUN 13 10  CREATININE 0.94 0.96  CALCIUM 7.8* 7.5*   PT/INR No results found for this basename: LABPROT, INR,  in the last 72 hours  No results found for this basename: AST, ALT, ALKPHOS, BILITOT, PROT, ALBUMIN,  in the last 168 hours   Lipase  No results found for this basename: lipase     Studies/Results: Dg Chest 2 View  01/28/2014   CLINICAL DATA:  Shortness of breath and cough.  History of melanoma.  EXAM: CHEST  2 VIEW  COMPARISON:  01/27/2014 and 04/29/2013  FINDINGS: Lungs are adequately inflated and demonstrate mild worsening consolidation  over the right central mid to lower lung there is a small amount of pleural fluid posteriorly in the lung bases. There is suggestion mild vascular congestion. Cardiomediastinal silhouette and remainder of the exam is unchanged.  IMPRESSION: Worsening opacification of the central right mid to lower lung likely infection. Small amount of pleural fluid posteriorly. Suggestion mild vascular congestion.   Electronically Signed   By: Marin Olp M.D.   On: 01/28/2014 11:51   Ct Abdomen Pelvis W Contrast  01/28/2014   CLINICAL DATA:  Sepsis, abdominal pain leukocytosis  EXAM: CT ABDOMEN AND PELVIS WITH CONTRAST  TECHNIQUE: Multidetector CT imaging of the abdomen and pelvis was performed using the standard protocol following bolus administration of intravenous contrast.  CONTRAST:  153mL OMNIPAQUE IOHEXOL 300 MG/ML  SOLN  COMPARISON:  Not available  FINDINGS: Bilateral pleural effusions and mild basilar atelectasis. No pericardial fluid.  No focal hepatic lesion. The gallbladder, pancreas, spleen, adrenal glands, kidneys are normal.  The stomach is normal. There is small hiatal hernia. The proximal small bowel is mildly dilated to 3 cm. There is poor progression of the oral contrast. The more distal small bowel leading to the terminal ileum is decompressed compared to the proximal small bowel measuring approximately 1 cm diameter. No cleartransition point is identified.  Within the deep pelvisthere are 2 adjacent ovoid fluid collections with rim enhancement positioned posterior  to the bladder and anterior to the rectum measuring 3.4 x 2.2 cm and 2.4 x 2.6 cm. These are felt to represent small abscess rather than loops of bowel or ovaries. Posterior within the deep pelvis a second fluid collection measuring 3.5 x 3.3 cm in the right aspect the posterior cul-de-sac.  There is small amount of gas along the left broad ligament (image 58). Second focus of gas in the region of the right adnexa/broad ligament. (image 60).  There several diverticula of the sigmoid colon pelvis appears relatively normal.  Abdominal or is normal caliber. No retroperitoneal periportal lymphadenopathy.  Bladder appears normal. No evidence of gas bladder. Post hysterectomy anatomy. The rectum appears normal.  There is no free air in the upper abdomen. Tiny focus of gas in the umbilicus.  IMPRESSION: 1. Inflammatory process in the pelvis. There are 3 fluid collections with enhancing rims which appear to represent abscesses located between the rectum and the bladder. These could could relate to prior hysterectomy and potential dehiscence of vaginal cuff. Diverticulitis would be a secondary less likely consideration. 2. Small amount of gas and fluid collections in left and right broad ligament / adnexal regions. These are also likely infections associated with same process. 3. Evidence of partial small bowel obstruction versus ileus. This likely relates to secondary inflammation of the small bowel as it dips into the pelvis. 4. Bilateral pleural effusions and basilar atelectasis. Findings conveyed topatient's ICU nurseon 01/28/2014  at16:31.   Electronically Signed   By: Suzy Bouchard M.D.   On: 01/28/2014 16:34    Medications: . ceFEPime (MAXIPIME) IV  1 g Intravenous 3 times per day  . cholecalciferol  2,000 Units Oral Daily  . enoxaparin (LOVENOX) injection  40 mg Subcutaneous Q24H  . estrogens (conjugated)  0.45 mg Oral Daily  . feeding supplement (ENSURE COMPLETE)  237 mL Oral BID BM  . levothyroxine  75 mcg Oral QAC breakfast  . loratadine  10 mg Oral Daily  . magnesium oxide  400 mg Oral Daily  . metronidazole  500 mg Intravenous Q8H  . multivitamin with minerals  1 tablet Oral Daily  . pantoprazole  40 mg Oral Daily  . vancomycin  750 mg Intravenous Q12H  . vitamin C  500 mg Oral Daily   . sodium chloride 125 mL/hr at 01/28/14 1454   Prior to Admission medications   Medication Sig Start Date End Date Taking? Authorizing Provider   albuterol (PROVENTIL HFA;VENTOLIN HFA) 108 (90 BASE) MCG/ACT inhaler Inhale 2 puffs into the lungs every 6 (six) hours as needed for wheezing.   Yes Historical Provider, MD  amLODipine (NORVASC) 5 MG tablet Take 5 mg by mouth daily.   Yes Historical Provider, MD  Aspirin-Caffeine (BC FAST PAIN RELIEF ARTHRITIS PO) Take 1 packet by mouth as needed (for pain).   Yes Historical Provider, MD  Cholecalciferol (VITAMIN D) 2000 UNITS tablet Take 2,000 Units by mouth daily.   Yes Historical Provider, MD  estrogens, conjugated, (PREMARIN) 0.45 MG tablet Take 0.45 mg by mouth daily.    Yes Historical Provider, MD  fexofenadine (ALLEGRA) 180 MG tablet Take 180 mg by mouth daily.   Yes Historical Provider, MD  guaifenesin (ROBITUSSIN) 100 MG/5ML syrup Take 200 mg by mouth 3 (three) times daily as needed for cough.   Yes Historical Provider, MD  levothyroxine (SYNTHROID, LEVOTHROID) 75 MCG tablet Take 75 mcg by mouth daily before breakfast.   Yes Historical Provider, MD  magnesium oxide (MAG-OX) 400 MG tablet  Take 400 mg by mouth daily.   Yes Historical Provider, MD  metoprolol (TOPROL-XL) 200 MG 24 hr tablet Take 200 mg by mouth daily.   Yes Historical Provider, MD  Multiple Vitamin (MULTIVITAMIN WITH MINERALS) TABS Take 1 tablet by mouth daily.   Yes Historical Provider, MD  omeprazole (PRILOSEC) 20 MG capsule Take 20 mg by mouth daily.   Yes Historical Provider, MD  potassium chloride (K-DUR,KLOR-CON) 10 MEQ tablet Take 10 mEq by mouth daily.   Yes Historical Provider, MD  solifenacin (VESICARE) 5 MG tablet Take 5 mg by mouth daily.   Yes Historical Provider, MD  valsartan-hydrochlorothiazide (DIOVAN-HCT) 320-12.5 MG per tablet Take 1 tablet by mouth daily.   Yes Historical Provider, MD  vitamin C (ASCORBIC ACID) 500 MG tablet Take 500 mg by mouth daily.   Yes Historical Provider, MD  VITAMIN E PO Take 500 Units by mouth daily.   Yes Historical Provider, MD     Assessment/Plan 1.New abdominal pain with  3 fluid collections with rim enhancement between rectum and bladder; PSBO vs ileus 2. Admitted with:  SOB, Cough, fever, confusion, weakness,fever, 3. Right basilar infiltrates (bilateral effusions on CT) 4. UTI 5.  Possible sepsis with hypotension 6.  Hx of tobacco use 7.  Hx of hypertension 8.  Hx of hypothyroid 9.  Hx of Thalassemia  10. Hx of back pain with cervical decompression 04/2013 11.  Hx of abdominal hysterectomy and bladder repair, 1978.  Recent evaluation by Urology  with instrumentation in their office this year.    Plan:  Medicine has ask IR to see and attempt to place IR drains.  She is on Cefepime for 2 days,   Vancomycin for 2 days.  Flagyl for 1 day  We will follow with you.  LOS: 2 days    Earnstine Regal 01/29/2014

## 2014-01-29 NOTE — Progress Notes (Signed)
PT Cancellation Note  Patient Details Name: AVIANNA MOYNAHAN MRN: [id number] DOB: 13-May-1932   Cancelled Treatment:    Reason Eval/Treat Not Completed: Medical issues which prohibited therapy Pt with abnormal CT abd/pelvis and plans per chart to go to IR this morning.   Junius Argyle 01/29/2014, 8:17 AM Carmelia Bake, PT, DPT 01/29/2014 Pager: [phone]

## 2014-01-29 NOTE — Progress Notes (Signed)
Note: This document was prepared with digital dictation and possible smart phrase technology. Any transcriptional errors that result from this process are unintentional.   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  Started on [place] by PCP, got worse  And developed toxic metabolic encephalopathy  Past medical history-As per Problem list Chart reviewed as below-   Consultants:  Gen surgery  IR   Procedures:  CT scan ab  PERC drain to abdomen planned for 4/15  Antibiotics:  Vanc 4/13  Cefepime 4/13  Flagyl 4/13 l  Subjective  Alert pleasant, Oriented x 2.  mildly confused earlier today States has some abd discomfort in lower quadrants bilaterally No CP, n/v/diarr/fever/chills   Objective    Interim History: Nursing reports some confusion  Telemetry: Sinus tach 120's   Objective: Filed Vitals:   01/28/14 2000 01/28/14 2300 01/29/14 0016 01/29/14 0400  BP: 124/55 96/30  125/36  Pulse: 116 112  113  Temp: 99.1 F (37.3 C)  98.2 F (36.8 C)   TempSrc: Oral  Oral   Resp:  24  21  Height:      Weight:      SpO2: 95% 98%  96%    Intake/Output Summary (Last 24 hours) at 01/29/14 0710 Last data filed at 01/29/14 0600  Gross per 24 hour  Intake   3765 ml  Output    725 ml  Net   3040 ml    Exam:  General:  EOMI, NCAT Cardiovascular: s1 s2 no m/r/g-tachy Respiratory: clear, no added sound Abdomen:  Soft, no rebound/gaurd Skin no Le edema Neuro intact   Data Reviewed: Basic Metabolic Panel:  Recent Labs Lab 01/27/14 2239 01/28/14 0324 01/29/14 0305  NA  --  138 138  K  --  3.6* 3.3*    CL  --  103 103  CO2  --  23 22  GLUCOSE  --  145* 127*  BUN  --  13 10  CREATININE 0.99 0.94 0.96  CALCIUM  --  7.8* 7.5*   Liver Function Tests: No results found for this basename: AST, ALT, ALKPHOS, BILITOT, PROT, ALBUMIN,  in the last 168 hours No results found for this basename: LIPASE, AMYLASE,  in the last 168 hours No results found for this basename: AMMONIA,  in the last 168 hours CBC:  Recent Labs Lab 01/27/14 2239 01/28/14 0324 01/29/14 0305  WBC 16.1* 16.3* 28.1*  HGB 8.9* 9.6* 9.6*  HCT 27.4* 29.5* 29.0*  MCV 63.3* 63.4* 64.3*  PLT 306 355 356   Cardiac Enzymes: No results found for this basename: CKTOTAL, CKMB, CKMBINDEX, TROPONINI,  in the last 168 hours BNP: No components found with this basename: POCBNP,  CBG: No results found for this basename: GLUCAP,  in the last 168 hours  Recent Results (from the past 240 hour(s))  MRSA PCR SCREENING     Status: None   Collection Time    01/27/14  8:56 PM      Result Value Ref Range Status   MRSA by PCR NEGATIVE  NEGATIVE Final   Comment:            The GeneXpert MRSA Assay (FDA  approved for NASAL specimens     only), is one component of a     comprehensive MRSA colonization     surveillance program. It is not     intended to diagnose MRSA     infection nor to guide or     monitor treatment for     MRSA infections.  URINE CULTURE     Status: None   Collection Time    01/27/14 10:53 PM      Result Value Ref Range Status   Specimen Description URINE, CLEAN CATCH   Final   Special Requests NONE   Final   Culture  Setup Time     Final   Value: 01/28/2014 05:28     Performed at [place]     Final   Value: NO GROWTH     Performed at [facility]   Culture     Final   Value: NO GROWTH     Performed at [facility]   Report Status 01/29/2014 FINAL   Final  CLOSTRIDIUM DIFFICILE BY PCR     Status: None   Collection Time    01/28/14  4:36 AM      Result  Value Ref Range Status   C difficile by pcr NEGATIVE  NEGATIVE Final   Comment: Performed at [facility]     Studies:              All Imaging reviewed and is as per above notation   Scheduled Meds: . ceFEPime (MAXIPIME) IV  1 g Intravenous 3 times per day  . cholecalciferol  2,000 Units Oral Daily  . enoxaparin (LOVENOX) injection  40 mg Subcutaneous Q24H  . estrogens (conjugated)  0.45 mg Oral Daily  . feeding supplement (ENSURE COMPLETE)  237 mL Oral BID BM  . levothyroxine  75 mcg Oral QAC breakfast  . loratadine  10 mg Oral Daily  . magnesium oxide  400 mg Oral Daily  . metronidazole  500 mg Intravenous Q8H  . multivitamin with minerals  1 tablet Oral Daily  . pantoprazole  40 mg Oral Daily  . vancomycin  750 mg Intravenous Q12H  . vitamin C  500 mg Oral Daily   Continuous Infusions: . sodium chloride 125 mL/hr at 01/28/14 1454     Assessment/Plan:  1. Sepsis-likely multifactorial 2/2 to multiple abcesses + hcap/uti.  Gen surgery input requested-appreciate input.  Patient NPO and will possibly undergo IR drain placement of abscesses.  Get Lactic acid, PCT to trend resolution as Tachycardic and WBC elevated.  Will discuss POC with family when available.  Continue broad spectrum Abx-Cefepime/Vancomycin/Zosyn-all other non-essential meds held for now 2. H/o chronic UTI's in the past in setting of potential bladder perforation-get Records from [facility]-current Abx should cover any infection 3. Sinus tachycardia-new finding this admission-likely related to #1.  Monitor.  Note not taking Home dose metoprolol XL 200 daily so could be rebound tachycardia.  Implement at lower dose once not NPO. 4. Htn-see above discussion.  Her HCTZ/Diovan, amlodipine is also on hold 5. Hypothyroid-TSH 7.37, T4 0.74 but would not adjust her medications currently  As she is acutely ill.  Would repeat labs in 1 month or so when more clinically stable 6. H/o  Thalasssemia 7. Hyperglycemia-A1c 6.0.  Monitor sugars while SDU status 8. Cervical spondylosis s.p ACDF 04/2013 9. Gerd-Continue pantoprazole 40 po daily 10. HRT-continue Premarin 0.45 daily  Code Status: Full Family Communication:  briefly d/w patients daughter at bedside 4/15 Disposition Plan:  SDU today   Verneita Griffes, MD  [facility] Pager [phone] 01/29/2014, 7:10 AM    LOS: 2 days

## 2014-01-29 NOTE — Progress Notes (Signed)
Clinical Social Work Department BRIEF PSYCHOSOCIAL ASSESSMENT 01/29/2014  Patient:  Laura Frey, Laura Frey     Account Number:  [id number]     Admit date:  01/27/2014  Clinical Social Worker:  Ulyess Blossom  Date/Time:  01/29/2014 10:16 AM  Referred by:  Physician  Date Referred:  01/29/2014 Referred for  Advanced Directives   Other Referral:   Interview type:  Patient Other interview type:   and patient daughter at bedside    PSYCHOSOCIAL DATA Living Status:  FAMILY Admitted from facility:   Level of care:   Primary support name:  Di Kindle Horner/daughter/[phone] Primary support relationship to patient:  CHILD, ADULT Degree of support available:   strong    CURRENT CONCERNS Current Concerns  Other - See comment   Other Concerns:   Advanced Directives    SOCIAL WORK ASSESSMENT / PLAN CSW received referral for Advanced Directives.    CSW met with pt and pt daughter and son at bedside. CSW introduced self and explained role. CSW provided packet and discussed Advanced Directives. CSW clarified pt daughters questions. Pt and pt daughter wished to review packet and CSW follow up tomorrow.    CSW to follow up tomorrow to continue to assist with advanced directives.   Assessment/plan status:  Psychosocial Support/Ongoing Assessment of Needs Other assessment/ plan:   Information/referral to community resources:   Garment/textile technologist    PATIENT'S/FAMILY'S RESPONSE TO PLAN OF CARE: Pt alert and oriented x 4, but had just received pain medication and appeared drowsy. Pt and pt daughter appreciative of CSW visit and information re: Advanced Directives. Support provided as pt has procedure today, but pt and pt daughter are agreeable to CSW returning tomorrow.    Alison Murray, MSW, [place] [facility] [phone]

## 2014-01-30 ENCOUNTER — Inpatient Hospital Stay (HOSPITAL_COMMUNITY): Payer: Medicare Other

## 2014-01-30 LAB — CBC
HEMATOCRIT: 32.3 % — AB (ref 36.0–46.0)
HEMOGLOBIN: 10.3 g/dL — AB (ref 12.0–15.0)
MCH: 20.4 pg — AB (ref 26.0–34.0)
MCHC: 31.9 g/dL (ref 30.0–36.0)
MCV: 63.8 fL — ABNORMAL LOW (ref 78.0–100.0)
Platelets: 426 10*3/uL — ABNORMAL HIGH (ref 150–400)
RBC: 5.06 MIL/uL (ref 3.87–5.11)
RDW: 15.5 % (ref 11.5–15.5)
WBC: 32.5 10*3/uL — ABNORMAL HIGH (ref 4.0–10.5)

## 2014-01-30 LAB — PROTIME-INR
INR: 1.31 (ref 0.00–1.49)
PROTHROMBIN TIME: 16 s — AB (ref 11.6–15.2)

## 2014-01-30 LAB — COMPREHENSIVE METABOLIC PANEL
ALK PHOS: 128 U/L — AB (ref 39–117)
ALT: 10 U/L (ref 0–35)
AST: 19 U/L (ref 0–37)
Albumin: 2.6 g/dL — ABNORMAL LOW (ref 3.5–5.2)
BUN: 10 mg/dL (ref 6–23)
CALCIUM: 8.2 mg/dL — AB (ref 8.4–10.5)
CO2: 22 mEq/L (ref 19–32)
Chloride: 100 mEq/L (ref 96–112)
Creatinine, Ser: 1.06 mg/dL (ref 0.50–1.10)
GFR calc non Af Amer: 49 mL/min — ABNORMAL LOW (ref >90)
GFR, EST AFRICAN AMERICAN: 56 mL/min — AB (ref >90)
Glucose, Bld: 116 mg/dL — ABNORMAL HIGH (ref 70–99)
Potassium: 3.5 mEq/L — ABNORMAL LOW (ref 3.7–5.3)
Sodium: 138 mEq/L (ref 137–147)
TOTAL PROTEIN: 6.4 g/dL (ref 6.0–8.3)
Total Bilirubin: 0.5 mg/dL (ref 0.3–1.2)

## 2014-01-30 LAB — PROCALCITONIN: Procalcitonin: 1.85 ng/mL

## 2014-01-30 MED ORDER — LEVOTHYROXINE SODIUM 100 MCG IV SOLR
37.5000 ug | Freq: Every day | INTRAVENOUS | Status: DC
  Administered 2014-01-31 – 2014-02-02 (×3): 37.5 ug via INTRAVENOUS
  Filled 2014-01-30 (×4): qty 5

## 2014-01-30 MED ORDER — TRAMADOL HCL 50 MG PO TABS: 50.0000 mg | ORAL_TABLET | Freq: Four times a day (QID) | ORAL | Status: DC | PRN

## 2014-01-30 MED ORDER — PHENOL 1.4 % MT LIQD
1.0000 | OROMUCOSAL | Status: DC | PRN
  Filled 2014-01-30: qty 177

## 2014-01-30 MED ORDER — CEFEPIME HCL 2 G IJ SOLR
2.0000 g | INTRAMUSCULAR | Status: DC
  Administered 2014-01-31 – 2014-02-01 (×2): 2 g via INTRAVENOUS
  Filled 2014-01-30 (×2): qty 2

## 2014-01-30 MED ORDER — FENTANYL CITRATE 0.05 MG/ML IJ SOLN
50.0000 ug | INTRAMUSCULAR | Status: DC | PRN
  Administered 2014-01-30 – 2014-01-31 (×5): 50 ug via INTRAVENOUS
  Filled 2014-01-30 (×4): qty 2

## 2014-01-30 MED ORDER — PANTOPRAZOLE SODIUM 40 MG PO TBEC: 40.0000 mg | DELAYED_RELEASE_TABLET | Freq: Two times a day (BID) | ORAL | Status: DC

## 2014-01-30 MED ORDER — PANTOPRAZOLE SODIUM 40 MG IV SOLR
40.0000 mg | Freq: Two times a day (BID) | INTRAVENOUS | Status: DC
  Administered 2014-01-30: 40 mg via INTRAVENOUS
  Filled 2014-01-30: qty 40

## 2014-01-30 MED ORDER — FAMOTIDINE IN NACL 20-0.9 MG/50ML-% IV SOLN
20.0000 mg | Freq: Two times a day (BID) | INTRAVENOUS | Status: DC
Start: 2014-01-30 — End: 2014-02-04
  Administered 2014-01-30 – 2014-02-03 (×8): 20 mg via INTRAVENOUS
  Filled 2014-01-30 (×11): qty 50

## 2014-01-30 MED ORDER — SODIUM CHLORIDE 0.9 % IV SOLN
INTRAVENOUS | Status: DC
  Administered 2014-01-30 – 2014-02-01 (×2): via INTRAVENOUS

## 2014-01-30 MED ORDER — DIATRIZOATE MEGLUMINE 30 % UR SOLN
Freq: Once | URETHRAL | Status: AC | PRN
  Administered 2014-01-30: 300 mL

## 2014-01-30 NOTE — Progress Notes (Signed)
Note: This document was prepared with digital dictation and possible smart phrase technology. Any transcriptional errors that result from this process are unintentional.   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  Started on Zpac by PCP, got worse  and developed toxic metabolic encephalopathy and was transferred to [facility]. She underwent CT scan abdomen/pelvis 4/14 showing 3 areas of potential abscesses, ?Vaginal dehiscence, ?P SBO and Gen surgery/IR consulted. Perc drain placed by IR 4/15 yielding clear fluid  Past medical history-As per Problem list Chart reviewed as below-   Consultants:  Gen surgery  IR   Procedures:  CT scan ab  PERC drain  4/15  Antibiotics:  Vanc 4/13  Cefepime 4/13  Flagyl 4/13 l  Subjective  Alert pleasant, sleepy.  No significant pain to abdomen but feels bloated. Not nauseous.  Hasn't passed stool since she has been here.  Not really passing much gas either. No vomiting reported Felt a little more SOB last night but this resolved   Objective    Interim History: Nursing reports some confusion  Telemetry: Sinus tach 120's   Objective: Filed Vitals:   01/29/14 2347 01/30/14 0000 01/30/14 0400 01/30/14 0511  BP: 145/48 144/54 132/55 124/52  Pulse: 110 110 114 111  Temp:  100.9 F (38.3 C) 97.5 F (36.4 C)   TempSrc:  Oral Oral   Resp:   20 21  Height:      Weight:      SpO2: 97% 97% 98% 98%    Intake/Output Summary (Last 24 hours) at 01/30/14 0804 Last data filed at 01/30/14 0615  Gross per 24 hour  Intake   2155 ml  Output    205 ml  Net   1950 ml     Exam:  General:  EOMI, NCAT Cardiovascular: s1 s2 no m/r/g-tachy Respiratory: clear, no added sound Abdomen:  Soft, no rebound/gaurd, but distended with no rebound/guarding Skin no Le edema Neuro intact   Data Reviewed: Basic Metabolic Panel:  Recent Labs Lab 01/27/14 2239  01/28/14 0324 01/29/14 0305 01/30/14 0317  NA  --   --  138 138 138  K  --   < > 3.6* 3.3* 3.5*  CL  --   --  103 103 100  CO2  --   --  23 22 22   GLUCOSE  --   --  145* 127* 116*  BUN  --   --  13 10 10   CREATININE 0.99  --  0.94 0.96 1.06  CALCIUM  --   --  7.8* 7.5* 8.2*  < > = values in this interval not displayed. Liver Function Tests:  Recent Labs Lab 01/30/14 0317  AST 19  ALT 10  ALKPHOS 128*  BILITOT 0.5  PROT 6.4  ALBUMIN 2.6*   No results found for this basename: LIPASE, AMYLASE,  in the last 168 hours No results found for this basename: AMMONIA,  in the last 168 hours CBC:  Recent Labs Lab 01/27/14 2239 01/28/14 0324 01/29/14 0305 01/30/14 0317  WBC 16.1* 16.3* 28.1* 32.5*  HGB 8.9* 9.6* 9.6* 10.3*  HCT 27.4* 29.5* 29.0* 32.3*  MCV 63.3* 63.4* 64.3* 63.8*  PLT 306  355 356 426*   Cardiac Enzymes: No results found for this basename: CKTOTAL, CKMB, CKMBINDEX, TROPONINI,  in the last 168 hours BNP: No components found with this basename: POCBNP,  CBG: No results found for this basename: GLUCAP,  in the last 168 hours  Recent Results (from the past 240 hour(s))  MRSA PCR SCREENING     Status: None   Collection Time    01/27/14  8:56 PM      Result Value Ref Range Status   MRSA by PCR NEGATIVE  NEGATIVE Final   Comment:            The GeneXpert MRSA Assay (FDA     approved for NASAL specimens     only), is one component of a     comprehensive MRSA colonization     surveillance program. It is not     intended to diagnose MRSA     infection nor to guide or     monitor treatment for     MRSA infections.  CULTURE, BLOOD (ROUTINE X 2)     Status: None    Collection Time    01/27/14 10:39 PM      Result Value Ref Range Status   Specimen Description BLOOD BLOOD RIGHT FOREARM   Final   Special Requests BOTTLES DRAWN AEROBIC ONLY 8CC   Final   Culture  Setup Time     Final   Value: 01/28/2014 03:56     Performed at [facility]   Culture     Final   Value:        BLOOD CULTURE RECEIVED NO GROWTH TO DATE CULTURE WILL BE HELD FOR 5 DAYS BEFORE ISSUING A FINAL NEGATIVE REPORT     Performed at [facility]   Report Status PENDING   Incomplete  CULTURE, BLOOD (ROUTINE X 2)     Status: None   Collection Time    01/27/14 10:47 PM      Result Value Ref Range Status   Specimen Description BLOOD LEFT ANTECUBITAL   Final   Special Requests BOTTLES DRAWN AEROBIC AND ANAEROBIC 10CC   Final   Culture  Setup Time     Final   Value: 01/28/2014 03:56     Performed at [facility]   Culture     Final   Value:        BLOOD CULTURE RECEIVED NO GROWTH TO DATE CULTURE WILL BE HELD FOR 5 DAYS BEFORE ISSUING A FINAL NEGATIVE REPORT     Performed at [facility]   Report Status PENDING   Incomplete  URINE CULTURE     Status: None   Collection Time    01/27/14 10:53 PM      Result Value Ref Range Status   Specimen Description URINE, CLEAN CATCH   Final   Special Requests NONE   Final   Culture  Setup Time     Final   Value: 01/28/2014 05:28     Performed at [place]     Final   Value: NO GROWTH     Performed at [facility]   Culture     Final   Value: NO GROWTH     Performed at [facility]   Report Status 01/29/2014 FINAL   Final  CLOSTRIDIUM DIFFICILE BY PCR     Status: None   Collection Time    01/28/14  4:36 AM  Result Value Ref Range Status   C difficile by pcr NEGATIVE  NEGATIVE Final   Comment: Performed at [facility]  BODY FLUID CULTURE     Status: None   Collection Time    01/29/14 12:43 PM      Result Value Ref Range Status   Specimen Description  FLUID PELVIC   Final   Special Requests NONE   Final   Gram Stain     Final   Value: FEW WBC PRESENT,BOTH PMN AND MONONUCLEAR     NO ORGANISMS SEEN     Performed at [facility]   Culture PENDING   Incomplete   Report Status PENDING   Incomplete     Studies:              All Imaging reviewed and is as per above notation   Scheduled Meds: . ceFEPime (MAXIPIME) IV  1 g Intravenous 3 times per day  . enoxaparin (LOVENOX) injection  40 mg Subcutaneous Q24H  . estrogens (conjugated)  0.45 mg Oral Daily  . levothyroxine  75 mcg Oral QAC breakfast  . loratadine  10 mg Oral Daily  . metoprolol  2.5-5 mg Intravenous 4 times per day  . metronidazole  500 mg Intravenous Q8H  . pantoprazole  40 mg Oral Daily  . vancomycin  750 mg Intravenous Q12H   Continuous Infusions: . sodium chloride 10 mL/hr at 01/29/14 2100     Assessment/Plan:  1.         Sepsis-likely multifactorial 2/2 to multiple abcesses + hcap/uti  UC from [place]=K Pneumonia-current antibiotics would cover this.  Gen surgery input requested-appreciate input.  Abscess drain culture pending.  Unclear if this is a physiologic fluid collection [dehiscence of hysterectomy?]-could be multiple processes as has increase in WBC 20  ???32, fever 10.9 overnight.  Continue broad spectrum Abx-Cefepime/Vancomycin/Zosyn-all other non-essential meds held for now.  Get AXR to rule out SBO and reassess clinically in am.   2. Sinus tachycardia-new finding this admission-likely related to #1.  Monitor.  Note not taking Home dose metoprolol XL 200 daily so could be rebound tachycardia.  Implement at lower dose once not NPO. 3. Htn-see above discussion.  Her HCTZ/Diovan, amlodipine is also on hold 4. Hypothyroid-TSH 7.37, T4 0.74 but would not adjust her medications currently  As she is acutely ill.  Would repeat labs in 1 month or so when more clinically stable 5. H/o Thalasssemia 6. Hyperglycemia-A1c 6.0.  Monitor sugars while SDU  status-ranges 127-145 7. Cervical spondylosis s.p ACDF 04/2013 8. Gerd-Continue pantoprazole 40 iv bid-change protonix 40 bid 9. HRT-hold Premarin 0.45 daily for now.  Code Status: Full Family Communication: d/w son at bedisde Disposition Plan:  SDU today   Verneita Griffes, MD  [facility] Pager [phone] 01/30/2014, 8:04 AM    LOS: 3 days

## 2014-01-30 NOTE — Progress Notes (Signed)
Subjective: Pt feels a little puny this morning but doesn't c/o of much pain. Feeling a little sore in right buttock and in leg some.  Objective: Physical Exam: BP 124/52  Pulse 111  Temp(Src) 97.5 F (36.4 C) (Oral)  Resp 21  Ht 5\' 2"  (1.575 m)  Wt 187 lb 6.3 oz (85 kg)  BMI 34.27 kg/m2  SpO2 98% (R)gluteal drain intact. Thin serous output in bulb, 57ml output    Labs: CBC  Recent Labs  01/29/14 0305 01/30/14 0317  WBC 28.1* 32.5*  HGB 9.6* 10.3*  HCT 29.0* 32.3*  PLT 356 426*   BMET  Recent Labs  01/29/14 0305 01/30/14 0317  NA 138 138  K 3.3* 3.5*  CL 103 100  CO2 22 22  GLUCOSE 127* 116*  BUN 10 10  CREATININE 0.96 1.06  CALCIUM 7.5* 8.2*   LFT  Recent Labs  01/30/14 0317  PROT 6.4  ALBUMIN 2.6*  AST 19  ALT 10  ALKPHOS 128*  BILITOT 0.5   PT/INR  Recent Labs  01/29/14 1246 01/30/14 0317  LABPROT 16.0* 16.0*  INR 1.31 1.31     Studies/Results: Dg Chest 2 View  01/28/2014   CLINICAL DATA:  Shortness of breath and cough.  History of melanoma.  EXAM: CHEST  2 VIEW  COMPARISON:  01/27/2014 and 04/29/2013  FINDINGS: Lungs are adequately inflated and demonstrate mild worsening consolidation over the right central mid to lower lung there is a small amount of pleural fluid posteriorly in the lung bases. There is suggestion mild vascular congestion. Cardiomediastinal silhouette and remainder of the exam is unchanged.  IMPRESSION: Worsening opacification of the central right mid to lower lung likely infection. Small amount of pleural fluid posteriorly. Suggestion mild vascular congestion.   Electronically Signed   By: Marin Olp M.D.   On: 01/28/2014 11:51   Ct Abdomen Pelvis W Contrast  01/28/2014   CLINICAL DATA:  Sepsis, abdominal pain leukocytosis  EXAM: CT ABDOMEN AND PELVIS WITH CONTRAST  TECHNIQUE: Multidetector CT imaging of the abdomen and pelvis was performed using the standard protocol following bolus administration of intravenous  contrast.  CONTRAST:  17mL OMNIPAQUE IOHEXOL 300 MG/ML  SOLN  COMPARISON:  Not available  FINDINGS: Bilateral pleural effusions and mild basilar atelectasis. No pericardial fluid.  No focal hepatic lesion. The gallbladder, pancreas, spleen, adrenal glands, kidneys are normal.  The stomach is normal. There is small hiatal hernia. The proximal small bowel is mildly dilated to 3 cm. There is poor progression of the oral contrast. The more distal small bowel leading to the terminal ileum is decompressed compared to the proximal small bowel measuring approximately 1 cm diameter. No cleartransition point is identified.  Within the deep pelvisthere are 2 adjacent ovoid fluid collections with rim enhancement positioned posterior to the bladder and anterior to the rectum measuring 3.4 x 2.2 cm and 2.4 x 2.6 cm. These are felt to represent small abscess rather than loops of bowel or ovaries. Posterior within the deep pelvis a second fluid collection measuring 3.5 x 3.3 cm in the right aspect the posterior cul-de-sac.  There is small amount of gas along the left broad ligament (image 58). Second focus of gas in the region of the right adnexa/broad ligament. (image 60). There several diverticula of the sigmoid colon pelvis appears relatively normal.  Abdominal or is normal caliber. No retroperitoneal periportal lymphadenopathy.  Bladder appears normal. No evidence of gas bladder. Post hysterectomy anatomy. The rectum appears normal.  There is  no free air in the upper abdomen. Tiny focus of gas in the umbilicus.  IMPRESSION: 1. Inflammatory process in the pelvis. There are 3 fluid collections with enhancing rims which appear to represent abscesses located between the rectum and the bladder. These could could relate to prior hysterectomy and potential dehiscence of vaginal cuff. Diverticulitis would be a secondary less likely consideration. 2. Small amount of gas and fluid collections in left and right broad ligament / adnexal  regions. These are also likely infections associated with same process. 3. Evidence of partial small bowel obstruction versus ileus. This likely relates to secondary inflammation of the small bowel as it dips into the pelvis. 4. Bilateral pleural effusions and basilar atelectasis. Findings conveyed topatient's ICU nurseon 01/28/2014  at16:31.   Electronically Signed   By: Suzy Bouchard M.D.   On: 01/28/2014 16:34   Dg Chest Port 1 View  01/29/2014   CLINICAL DATA:  Shortness of breath and weakness.  EXAM: PORTABLE CHEST - 1 VIEW  COMPARISON:  01/28/2014.  FINDINGS: The cardiac silhouette, mediastinal and hilar contours are stable. Slightly lower lung volumes with vascular crowding and atelectasis. The right lower lobe process appears slightly worse but this is largely due to the lower lung volumes. No pneumothorax or pleural effusion.  IMPRESSION: Lower lung volumes with increase in vascular crowding and bibasilar atelectasis, right greater than left.   Electronically Signed   By: Kalman Jewels M.D.   On: 01/29/2014 19:21   Ct Image Guided Drainage Percut Cath  Peritoneal Retroperit  01/29/2014   CLINICAL DATA:  Pelvic abscess  EXAM: CT IMAGE GUIDED DRAINAGE PERCUT CATH  PERITONEAL RETROPERIT  FLUOROSCOPY TIME:  None minutes  MEDICATIONS AND MEDICAL HISTORY: Versed 1 mg, Fentanyl 50 mcg.  Additional Medications: None.  ANESTHESIA/SEDATION: Moderate sedation time: 20 minutes  CONTRAST:  None  PROCEDURE: The procedure, risks, benefits, and alternatives were explained to the patient. Questions regarding the procedure were encouraged and answered. The patient understands and consents to the procedure.  The right gluteal region in the prone position was prepped with Betadine in a sterile fashion, and a sterile drape was applied covering the operative field. A sterile gown and sterile gloves were used for the procedure.  Under CT guidance, an 18 gauge needle was inserted into the pelvic abscess via a right trans  gluteal approach. It was removed over an Amplatz wire. A 12 French dilator followed by a 12 [place] drain were inserted. It was looped and string fixed in the fluid collection. Slightly cloudy yellow fluid was aspirated.  FINDINGS: Images document 110 French drain placement into a pelvic abscess via right trans gluteal approach  COMPLICATIONS: None  IMPRESSION: Successful right trans gluteal pelvic abscess drainage.   Electronically Signed   By: Maryclare Bean M.D.   On: 01/29/2014 13:33    Assessment/Plan: S/p transgluteal (R)pelvic fluic collection drainage 4/15. Fluid looks pretty innocent, though WBC still trending up. Cont flushes/follow Cx pending    LOS: 3 days    Ascencion Dike PA-C 01/30/2014 10:05 AM

## 2014-01-30 NOTE — Progress Notes (Signed)
PT Cancellation Note  Patient Details Name: Laura Frey MRN: [id number] DOB: 30-Jul-1932   Cancelled Treatment:    Reason Eval/Treat Not Completed: Medical issues which prohibited therapy   Shella Maxim [facility] 01/30/2014, 4:26 PM

## 2014-01-30 NOTE — Progress Notes (Signed)
[id number] Rosana Hoes, RN, BSN, CCM, [id number] Chart reviewed for update of needs and condition./ remains febrile and elevated wbc and labs.

## 2014-01-30 NOTE — Progress Notes (Addendum)
[place] for vancomycin/cefepime (pharmacy may adjust antibiotics for renal function) Indication: rule out pneumonia  Allergies  Allergen Reactions  . Hydrocodone     hallucinations  . Oxycodone     hallucinations  . Zoloft [Sertraline Hcl]     Burns stomach  . Ciprofloxacin Swelling  . Sulfa Antibiotics     Blisters in mouth  . Tylenol [Acetaminophen]     headaches  . Ultram [Tramadol]     headaches    Patient Measurements: Height: 5\' 2"  (157.5 cm) Weight: 187 lb 6.3 oz (85 kg) IBW/kg (Calculated) : 50.1 Adjusted Body Weight:   Vital Signs: Temp: 97.5 F (36.4 C) (04/16 0400) Temp src: Oral (04/16 0400) BP: 124/52 mmHg (04/16 0511) Pulse Rate: 111 (04/16 0511) Intake/Output from previous day: 04/15 0701 - 04/16 0700 In: 2280 [P.O.:320; I.V.:1300; IV Piggyback:650] Out: 305 [Urine:290; Drains:15] Intake/Output from this shift:    Labs:  Recent Labs  01/28/14 0324 01/29/14 0305 01/30/14 0317  WBC 16.3* 28.1* 32.5*  HGB 9.6* 9.6* 10.3*  PLT 355 356 426*  CREATININE 0.94 0.96 1.06   Estimated Creatinine Clearance: 43.5 ml/min (by C-G formula based on Cr of 1.06). No results found for this basename: VANCOTROUGH, Corlis Leak, VANCORANDOM, Carlos, GENTPEAK, GENTRANDOM, TOBRATROUGH, TOBRAPEAK, TOBRARND, AMIKACINPEAK, AMIKACINTROU, AMIKACIN,  in the last 72 hours   Microbiology: Recent Results (from the past 720 hour(s))  MRSA PCR SCREENING     Status: None   Collection Time    01/27/14  8:56 PM      Result Value Ref Range Status   MRSA by PCR NEGATIVE  NEGATIVE Final   Comment:            The GeneXpert MRSA Assay (FDA     approved for NASAL specimens     only), is one component of a     comprehensive MRSA colonization     surveillance program. It is not     intended to diagnose MRSA     infection nor to guide or     monitor treatment for     MRSA infections.  CULTURE, BLOOD (ROUTINE X 2)     Status: None   Collection Time    01/27/14 10:39 PM      Result Value Ref Range Status   Specimen Description BLOOD BLOOD RIGHT FOREARM   Final   Special Requests BOTTLES DRAWN AEROBIC ONLY 8CC   Final   Culture  Setup Time     Final   Value: 01/28/2014 03:56     Performed at [facility]   Culture     Final   Value:        BLOOD CULTURE RECEIVED NO GROWTH TO DATE CULTURE WILL BE HELD FOR 5 DAYS BEFORE ISSUING A FINAL NEGATIVE REPORT     Performed at [facility]   Report Status PENDING   Incomplete  CULTURE, BLOOD (ROUTINE X 2)     Status: None   Collection Time    01/27/14 10:47 PM      Result Value Ref Range Status   Specimen Description BLOOD LEFT ANTECUBITAL   Final   Special Requests BOTTLES DRAWN AEROBIC AND ANAEROBIC 10CC   Final   Culture  Setup Time     Final   Value: 01/28/2014 03:56     Performed at [facility]   Culture     Final   Value:        BLOOD CULTURE RECEIVED NO  GROWTH TO DATE CULTURE WILL BE HELD FOR 5 DAYS BEFORE ISSUING A FINAL NEGATIVE REPORT     Performed at [facility]   Report Status PENDING   Incomplete  URINE CULTURE     Status: None   Collection Time    01/27/14 10:53 PM      Result Value Ref Range Status   Specimen Description URINE, CLEAN CATCH   Final   Special Requests NONE   Final   Culture  Setup Time     Final   Value: 01/28/2014 05:28     Performed at [facility]     Final   Value: NO GROWTH     Performed at [facility]   Culture     Final   Value: NO GROWTH     Performed at [facility]   Report Status 01/29/2014 FINAL   Final  CLOSTRIDIUM DIFFICILE BY PCR     Status: None   Collection Time    01/28/14  4:36 AM      Result Value Ref Range Status   C difficile by pcr NEGATIVE  NEGATIVE Final   Comment: Performed at [facility]  BODY FLUID CULTURE     Status: None   Collection Time    01/29/14 12:43 PM      Result Value Ref Range Status   Specimen Description  FLUID PELVIC   Final   Special Requests NONE   Final   Gram Stain     Final   Value: FEW WBC PRESENT,BOTH PMN AND MONONUCLEAR     NO ORGANISMS SEEN     Performed at [facility]   Culture PENDING   Incomplete   Report Status PENDING   Incomplete    Medical History: Past Medical History  Diagnosis Date  . Hypertension   . Hypothyroidism   . Depression   . Shortness of breath   . Asthma   . Peripheral vascular disease     legs  . GERD (gastroesophageal reflux disease)   . Headache(784.0)   . Cancer     melanoma on back  . Anemia     hx Thalassemia minor anemia  . Tendinitis of both rotator cuffs   . Chronic back pain     ESI/uses BC powders   Assessment: 81 YOF transferred from [place] with SOB, coughing, and fevers x 2 weeks. Orders to start vancomycin and cefepime. CXR with possible RLL infiltrate.  Vancomycin 1gm, ceftriaxone, azithromycin given at [facility] ED.  Day 4 Abx 4/13 >> vancomycin >> 4/13 >> cefepime >> (8 days total) 4/14 >> flagyl >>  Tmax: Now AF WBCs: Rising, 32.5 Renal: SCr stable, CrCl 43.58ml/min, 61ml/min (N)  4/13 blood: NGTD 4/13 urine: NGF 4/13 sputum: ordered 4/13 S. pneumo Ag: negative 4/13 Legionella Ag: negative 4/13 Flu panel: sent 4/13 Flu Ag: sent  4/13 MRSA PCR - neg 4/14 Cdiff PCR: negative 4/15 Pelvic fluid-pending, no org. seen  Goal of Therapy:  Vancomycin trough level 15-20 mcg/ml  Plan Cont. Vancomycin 750mg  IV q12h.  Reduce Cefepime to 2gm IV q24h.  Will check Vanc Trough tonight at [facility].   F/U renal fxn, WBC, Tmax, microbiology    Dolly Rias RPh 01/30/2014 [phone]  Ralene Bathe, PharmD, BCPS 01/30/2014, 10:58 AM  Pager: [phone]

## 2014-01-30 NOTE — Progress Notes (Signed)
Subjective: Really sleepy this Am, family member says she slept all last night.  She didn't really complain of any pain.  When i ask her to feel her abdomen, she says she still feels it and it bothers her.  She can barely stay awake during exam and there are no signs of peritonitis.  Fluid from the drain is clear yellow and looks like a light colored urine.  Objective: Vital signs in last 24 hours: Temp:  [97.5 F (36.4 C)-100.9 F (38.3 C)] 97.5 F (36.4 C) (04/16 0400) Pulse Rate:  [109-129] 111 (04/16 0511) Resp:  [14-27] 21 (04/16 0511) BP: (103-145)/(44-68) 124/52 mmHg (04/16 0511) SpO2:  [90 %-100 %] 98 % (04/16 0511) Last BM Date: 01/28/14 320 PO   Regular Diet ordered. 15 ml from the drain No BM recorded TM100.9 last PM  Still tachycardic WBC rising K+ is low procalctonin1.85 Intake/Output from previous day: 04/15 0701 - 04/16 0700 In: 2280 [P.O.:320; I.V.:1300; IV Piggyback:650] Out: 305 [Urine:290; Drains:15] Intake/Output this shift:    General appearance: cooperative, no distress and she was sound asleep when I saw her, she woke up and was still sleepy but answering questions. GI: She says her abdomen still hurts, but there is no specific area of discomfort.  she has no peritonitis.  Few BS, Drainage is clear, no evidence of fecal or purulent drainage.  Lab Results:   Recent Labs  01/29/14 0305 01/30/14 0317  WBC 28.1* 32.5*  HGB 9.6* 10.3*  HCT 29.0* 32.3*  PLT 356 426*    BMET  Recent Labs  01/29/14 0305 01/30/14 0317  NA 138 138  K 3.3* 3.5*  CL 103 100  CO2 22 22  GLUCOSE 127* 116*  BUN 10 10  CREATININE 0.96 1.06  CALCIUM 7.5* 8.2*   PT/INR  Recent Labs  01/29/14 1246 01/30/14 0317  LABPROT 16.0* 16.0*  INR 1.31 1.31     Recent Labs Lab 01/30/14 0317  AST 19  ALT 10  ALKPHOS 128*  BILITOT 0.5  PROT 6.4  ALBUMIN 2.6*     Lipase  No results found for this basename: lipase     Studies/Results: Dg Chest 2  View  01/28/2014   CLINICAL DATA:  Shortness of breath and cough.  History of melanoma.  EXAM: CHEST  2 VIEW  COMPARISON:  01/27/2014 and 04/29/2013  FINDINGS: Lungs are adequately inflated and demonstrate mild worsening consolidation over the right central mid to lower lung there is a small amount of pleural fluid posteriorly in the lung bases. There is suggestion mild vascular congestion. Cardiomediastinal silhouette and remainder of the exam is unchanged.  IMPRESSION: Worsening opacification of the central right mid to lower lung likely infection. Small amount of pleural fluid posteriorly. Suggestion mild vascular congestion.   Electronically Signed   By: Marin Olp M.D.   On: 01/28/2014 11:51   Ct Abdomen Pelvis W Contrast  01/28/2014   CLINICAL DATA:  Sepsis, abdominal pain leukocytosis  EXAM: CT ABDOMEN AND PELVIS WITH CONTRAST  TECHNIQUE: Multidetector CT imaging of the abdomen and pelvis was performed using the standard protocol following bolus administration of intravenous contrast.  CONTRAST:  178mL OMNIPAQUE IOHEXOL 300 MG/ML  SOLN  COMPARISON:  Not available  FINDINGS: Bilateral pleural effusions and mild basilar atelectasis. No pericardial fluid.  No focal hepatic lesion. The gallbladder, pancreas, spleen, adrenal glands, kidneys are normal.  The stomach is normal. There is small hiatal hernia. The proximal small bowel is mildly dilated to 3 cm. There  is poor progression of the oral contrast. The more distal small bowel leading to the terminal ileum is decompressed compared to the proximal small bowel measuring approximately 1 cm diameter. No cleartransition point is identified.  Within the deep pelvisthere are 2 adjacent ovoid fluid collections with rim enhancement positioned posterior to the bladder and anterior to the rectum measuring 3.4 x 2.2 cm and 2.4 x 2.6 cm. These are felt to represent small abscess rather than loops of bowel or ovaries. Posterior within the deep pelvis a second fluid  collection measuring 3.5 x 3.3 cm in the right aspect the posterior cul-de-sac.  There is small amount of gas along the left broad ligament (image 58). Second focus of gas in the region of the right adnexa/broad ligament. (image 60). There several diverticula of the sigmoid colon pelvis appears relatively normal.  Abdominal or is normal caliber. No retroperitoneal periportal lymphadenopathy.  Bladder appears normal. No evidence of gas bladder. Post hysterectomy anatomy. The rectum appears normal.  There is no free air in the upper abdomen. Tiny focus of gas in the umbilicus.  IMPRESSION: 1. Inflammatory process in the pelvis. There are 3 fluid collections with enhancing rims which appear to represent abscesses located between the rectum and the bladder. These could could relate to prior hysterectomy and potential dehiscence of vaginal cuff. Diverticulitis would be a secondary less likely consideration. 2. Small amount of gas and fluid collections in left and right broad ligament / adnexal regions. These are also likely infections associated with same process. 3. Evidence of partial small bowel obstruction versus ileus. This likely relates to secondary inflammation of the small bowel as it dips into the pelvis. 4. Bilateral pleural effusions and basilar atelectasis. Findings conveyed topatient's ICU nurseon 01/28/2014  at16:31.   Electronically Signed   By: Suzy Bouchard M.D.   On: 01/28/2014 16:34   Dg Chest Port 1 View  01/29/2014   CLINICAL DATA:  Shortness of breath and weakness.  EXAM: PORTABLE CHEST - 1 VIEW  COMPARISON:  01/28/2014.  FINDINGS: The cardiac silhouette, mediastinal and hilar contours are stable. Slightly lower lung volumes with vascular crowding and atelectasis. The right lower lobe process appears slightly worse but this is largely due to the lower lung volumes. No pneumothorax or pleural effusion.  IMPRESSION: Lower lung volumes with increase in vascular crowding and bibasilar atelectasis,  right greater than left.   Electronically Signed   By: Kalman Jewels M.D.   On: 01/29/2014 19:21   Ct Image Guided Drainage Percut Cath  Peritoneal Retroperit  01/29/2014   CLINICAL DATA:  Pelvic abscess  EXAM: CT IMAGE GUIDED DRAINAGE PERCUT CATH  PERITONEAL RETROPERIT  FLUOROSCOPY TIME:  None minutes  MEDICATIONS AND MEDICAL HISTORY: Versed 1 mg, Fentanyl 50 mcg.  Additional Medications: None.  ANESTHESIA/SEDATION: Moderate sedation time: 20 minutes  CONTRAST:  None  PROCEDURE: The procedure, risks, benefits, and alternatives were explained to the patient. Questions regarding the procedure were encouraged and answered. The patient understands and consents to the procedure.  The right gluteal region in the prone position was prepped with Betadine in a sterile fashion, and a sterile drape was applied covering the operative field. A sterile gown and sterile gloves were used for the procedure.  Under CT guidance, an 18 gauge needle was inserted into the pelvic abscess via a right trans gluteal approach. It was removed over an Amplatz wire. A 12 French dilator followed by a 12 [place] drain were inserted. It was looped and string fixed  in the fluid collection. Slightly cloudy yellow fluid was aspirated.  FINDINGS: Images document 62 French drain placement into a pelvic abscess via right trans gluteal approach  COMPLICATIONS: None  IMPRESSION: Successful right trans gluteal pelvic abscess drainage.   Electronically Signed   By: Maryclare Bean M.D.   On: 01/29/2014 13:33    Medications: . ceFEPime (MAXIPIME) IV  1 g Intravenous 3 times per day  . enoxaparin (LOVENOX) injection  40 mg Subcutaneous Q24H  . estrogens (conjugated)  0.45 mg Oral Daily  . levothyroxine  75 mcg Oral QAC breakfast  . loratadine  10 mg Oral Daily  . metoprolol  2.5-5 mg Intravenous 4 times per day  . metronidazole  500 mg Intravenous Q8H  . pantoprazole  40 mg Oral Daily  . vancomycin  750 mg Intravenous Q12H     Assessment/Plan 1.New abdominal pain with 3 fluid collections with rim enhancement between rectum and bladder; PSBO vs ileus  S/p right transgluteal abscess drain 01/29/14. (simple serous fluid. Cr on fluid was 1. Doesn't appear to be c/w intestinal source. F/u cx from aspiration. Could just be sterile fluid collection.)  Cultures show no growth so far, C diff ws negative 2. Admitted with: SOB, Cough, fever, confusion, weakness,fever,  3. Right basilar infiltrates (bilateral effusions on CT)  4. UTI  5. Possible sepsis with hypotension  6. Hx of tobacco use  7. Hx of hypertension  8. Hx of hypothyroid  9. Hx of Thalassemia  10. Hx of back pain with cervical decompression 04/2013  11. Hx of abdominal hysterectomy and bladder repair, 1978. Recent evaluation by Urology with instrumentation in their office this year.  Plan:  I am concerned with her WBC being up, but at this point I don't see any acute findings on abdominal exam.     LOS: 3 days    Earnstine Regal 01/30/2014

## 2014-01-30 NOTE — Progress Notes (Signed)
PHARMACY BRIEF NOTE:  PROTONIX IV TO PO CONVERSION  This patient is receiving IV Protonix, which is in critically short supply.    Based on emergency conservation measures implemented by the Pharmacy and Therapeutics Committee chairman in consultation with gastroenterology, this patient now meets criteria for automatic switch to PO Protonix; the medication profile has been updated accordingly.  If there are questions regarding this change, please contact Pharmacy at [phone]  Thank you,   Dolly Rias [facility] 01/30/2014 Pager [phone]

## 2014-01-31 DIAGNOSIS — R933 Abnormal findings on diagnostic imaging of other parts of digestive tract: Secondary | ICD-10-CM

## 2014-01-31 LAB — CBC WITH DIFFERENTIAL/PLATELET
BASOS ABS: 0 10*3/uL (ref 0.0–0.1)
Basophils Relative: 0 % (ref 0–1)
EOS PCT: 1 % (ref 0–5)
Eosinophils Absolute: 0.2 10*3/uL (ref 0.0–0.7)
HCT: 27.9 % — ABNORMAL LOW (ref 36.0–46.0)
Hemoglobin: 8.9 g/dL — ABNORMAL LOW (ref 12.0–15.0)
LYMPHS ABS: 1.7 10*3/uL (ref 0.7–4.0)
Lymphocytes Relative: 7 % — ABNORMAL LOW (ref 12–46)
MCH: 20.3 pg — AB (ref 26.0–34.0)
MCHC: 31.9 g/dL (ref 30.0–36.0)
MCV: 63.6 fL — AB (ref 78.0–100.0)
Monocytes Absolute: 1.4 10*3/uL — ABNORMAL HIGH (ref 0.1–1.0)
Monocytes Relative: 6 % (ref 3–12)
Neutro Abs: 20.7 10*3/uL — ABNORMAL HIGH (ref 1.7–7.7)
Neutrophils Relative %: 86 % — ABNORMAL HIGH (ref 43–77)
PLATELETS: 399 10*3/uL (ref 150–400)
RBC: 4.39 MIL/uL (ref 3.87–5.11)
RDW: 15.3 % (ref 11.5–15.5)
WBC: 24 10*3/uL — ABNORMAL HIGH (ref 4.0–10.5)

## 2014-01-31 LAB — COMPREHENSIVE METABOLIC PANEL
ALT: 9 U/L (ref 0–35)
AST: 14 U/L (ref 0–37)
Albumin: 2.3 g/dL — ABNORMAL LOW (ref 3.5–5.2)
Alkaline Phosphatase: 92 U/L (ref 39–117)
BUN: 11 mg/dL (ref 6–23)
CALCIUM: 8.2 mg/dL — AB (ref 8.4–10.5)
CO2: 23 meq/L (ref 19–32)
Chloride: 102 mEq/L (ref 96–112)
Creatinine, Ser: 0.93 mg/dL (ref 0.50–1.10)
GFR calc Af Amer: 66 mL/min — ABNORMAL LOW (ref >90)
GFR, EST NON AFRICAN AMERICAN: 57 mL/min — AB (ref >90)
Glucose, Bld: 106 mg/dL — ABNORMAL HIGH (ref 70–99)
Potassium: 3.4 mEq/L — ABNORMAL LOW (ref 3.7–5.3)
Sodium: 137 mEq/L (ref 137–147)
Total Bilirubin: 0.4 mg/dL (ref 0.3–1.2)
Total Protein: 5.8 g/dL — ABNORMAL LOW (ref 6.0–8.3)

## 2014-01-31 LAB — VANCOMYCIN, TROUGH: Vancomycin Tr: 14.4 ug/mL (ref 10.0–20.0)

## 2014-01-31 LAB — PROCALCITONIN: Procalcitonin: 1.23 ng/mL

## 2014-01-31 MED ORDER — FENTANYL CITRATE 0.05 MG/ML IJ SOLN
25.0000 ug | INTRAMUSCULAR | Status: DC | PRN
  Administered 2014-01-31 – 2014-02-02 (×8): 25 ug via INTRAVENOUS
  Filled 2014-01-31 (×8): qty 2

## 2014-01-31 MED ORDER — ALBUTEROL SULFATE (2.5 MG/3ML) 0.083% IN NEBU
2.5000 mg | INHALATION_SOLUTION | Freq: Four times a day (QID) | RESPIRATORY_TRACT | Status: DC
  Administered 2014-01-31 – 2014-02-02 (×4): 2.5 mg via RESPIRATORY_TRACT
  Filled 2014-01-31 (×5): qty 3

## 2014-01-31 MED ORDER — METOPROLOL TARTRATE 1 MG/ML IV SOLN
2.5000 mg | Freq: Four times a day (QID) | INTRAVENOUS | Status: DC
  Administered 2014-01-31 – 2014-02-03 (×11): 2.5 mg via INTRAVENOUS
  Filled 2014-01-31 (×13): qty 5

## 2014-01-31 MED ORDER — ACETAMINOPHEN 10 MG/ML IV SOLN
1000.0000 mg | Freq: Four times a day (QID) | INTRAVENOUS | Status: DC
  Filled 2014-01-31 (×5): qty 100

## 2014-01-31 MED ORDER — ALBUTEROL SULFATE (2.5 MG/3ML) 0.083% IN NEBU
2.5000 mg | INHALATION_SOLUTION | RESPIRATORY_TRACT | Status: DC | PRN
  Administered 2014-02-01: 2.5 mg via RESPIRATORY_TRACT
  Filled 2014-01-31: qty 3

## 2014-01-31 MED ORDER — VANCOMYCIN HCL IN DEXTROSE 1-5 GM/200ML-% IV SOLN
1000.0000 mg | Freq: Two times a day (BID) | INTRAVENOUS | Status: DC
  Administered 2014-01-31 – 2014-02-02 (×5): 1000 mg via INTRAVENOUS
  Filled 2014-01-31 (×7): qty 200

## 2014-01-31 NOTE — Progress Notes (Signed)
Subjective: Pt feels a little better today. Denies much discomfort Had repeat CT yesterday due to continued Leukocytosis  Objective: Physical Exam: BP 125/49  Pulse 104  Temp(Src) 98 F (36.7 C) (Oral)  Resp 20  Ht 5\' 2"  (1.575 m)  Wt 188 lb 4.4 oz (85.4 kg)  BMI 34.43 kg/m2  SpO2 97% (R)gluteal drain intact. Thin serous output in bulb, 55ml output   Labs: CBC  Recent Labs  01/30/14 0317 01/31/14 0106  WBC 32.5* 24.0*  HGB 10.3* 8.9*  HCT 32.3* 27.9*  PLT 426* 399   BMET  Recent Labs  01/30/14 0317 01/31/14 0106  NA 138 137  K 3.5* 3.4*  CL 100 102  CO2 22 23  GLUCOSE 116* 106*  BUN 10 11  CREATININE 1.06 0.93  CALCIUM 8.2* 8.2*   LFT  Recent Labs  01/31/14 0106  PROT 5.8*  ALBUMIN 2.3*  AST 14  ALT 9  ALKPHOS 92  BILITOT 0.4   PT/INR  Recent Labs  01/29/14 1246 01/30/14 0317  LABPROT 16.0* 16.0*  INR 1.31 1.31     Studies/Results: Ct Pelvis Wo Contrast  01/30/2014   CLINICAL DATA:  Pelvic abscess of unclear etiology. Rectal contrast given to evaluate for leak/fistula.  EXAM: CT PELVIS WITHOUT CONTRAST  TECHNIQUE: Multidetector CT imaging of the pelvis was performed following the standard protocol without intravenous contrast. Rectal contrast was administered via rectal catheter. Patient had difficulty keeping balloon catheter in the rectum.  COMPARISON:  01/28/2014  FINDINGS: Foley catheter is present within a collapsed bladder. Rectal catheter is in place with contrast filling the rectosigmoid colon and remainder of the visualized colon. There is no evidence of contrast extravasation/leak. B fluid collection just lateral and inferior to the sigmoid colon in the left pelvis is unchanged to slightly larger measuring 3 x 5.2 cm with less air within the fluid collection. There is a right gluteal pigtail drainage catheter with pigtail over the right pelvis as there has been resolution of the deep pelvic fluid collections in the region of the  vaginal cuff and right perirectal region. There is a continued fluid collection with a few small air-fluid levels over the right upper pelvis without significant change as this measures 4.3 x 4.9 cm. The pigtail drainage catheter is immediately posterior and slightly inferior to this persistent fluid collection. No evidence of free air. There are persistent air-filled mildly dilated small bowel loops likely secondary ileus to this inflammatory/infectious process. There is moderate diverticulosis of the colon. Remainder the exam is unchanged.  IMPRESSION: Right gluteal pigtail drainage catheter in place with pigtail just right of midline and interval resolution of the fluid collections over the right perirectal region and vaginal cuff. Persistent fluid collection over the left pelvis inferior lateral to the adjacent sigmoid colon with slight increase in size measuring 3.1 x 5.2 cm. Stable fluid collection with a few small air-fluid levels over the right upper pelvis. The adjacent rectosigmoid colon is well opacified with contrast as there is no evidence of contrast extravasation/ leak or fistula.  Continued persistent dilated fluid and air-filled small bowel loops likely secondary ileus to the pelvic infectious/inflammatory process.  Diverticulosis of the colon.   Electronically Signed   By: Marin Olp M.D.   On: 01/30/2014 17:11   Dg Cystogram  01/30/2014   CLINICAL DATA:  Pelvic abscesses.  EXAM: CYSTOGRAM  TECHNIQUE: After catheterization of the urinary bladder following sterile technique the bladder was filled with 300 mL Cysto-Hypaque 30% by drip infusion.  Serial spot images were obtained during bladder filling and post draining.  FLUOROSCOPY TIME:  2 min 9 seconds slow pulsed fluoroscopy adjusted for body habitus  COMPARISON:  CT scans dated 04/14 and 01/30/2014  FINDINGS: Contrast was instilled through the indwelling Foley catheter. Contrast is seen in the colon from the previous CT scan. I placed the  patient in the lateral projection and filled the bladder. Multiple radiographs were then obtained in multiple projections which demonstrate no evidence of extravasation of contrast. No visible fistula to the bowel.  Abscess drainage catheter is noted. Post drainage radiograph demonstrates no contrast in the abscess drainage catheter.  IMPRESSION: Normal cystogram.   Electronically Signed   By: Rozetta Nunnery M.D.   On: 01/30/2014 17:45   Dg Chest Port 1 View  01/29/2014   CLINICAL DATA:  Shortness of breath and weakness.  EXAM: PORTABLE CHEST - 1 VIEW  COMPARISON:  01/28/2014.  FINDINGS: The cardiac silhouette, mediastinal and hilar contours are stable. Slightly lower lung volumes with vascular crowding and atelectasis. The right lower lobe process appears slightly worse but this is largely due to the lower lung volumes. No pneumothorax or pleural effusion.  IMPRESSION: Lower lung volumes with increase in vascular crowding and bibasilar atelectasis, right greater than left.   Electronically Signed   By: Kalman Jewels M.D.   On: 01/29/2014 19:21   Dg Abd Acute W/chest  01/30/2014   CLINICAL DATA:  Shortness of breath.  Diffuse abdominal pain.  EXAM: ACUTE ABDOMEN SERIES (ABDOMEN 2 VIEW & CHEST 1 VIEW)  COMPARISON:  Chest x-ray dated 01/29/2014 and CT scan dated 01/28/2014  FINDINGS: Right middle lobe atelectasis has improved. There is still slight pulmonary vascular congestion. Slight linear atelectasis at the left lung base. Tiny bilateral effusions.  No free air in the abdomen. Proximal small bowel remains dilated. Contrast has passed into the nondistended colon. Abscess drainage catheter present in the right side of the pelvis.  IMPRESSION: Persistent pattern of partial small bowel obstruction. Contrast has moved into the nondistended colon.   Electronically Signed   By: Rozetta Nunnery M.D.   On: 01/30/2014 13:30    Assessment/Plan: S/p transgluteal (R)pelvic fluic collection drainage 4/15. Fluid looks  pretty innocent. WBC down some today to 24k Repeat CT reviewed with Dr. Vernard Gambles. Residual/persistent fluid collections are not amenable to drainage due to adjacent bowel. Recommend continue present plan for a few more days, see if collections will decompress via current drain. Repeat CT early next week to reassess. Cont flushes/follow Cx pending    LOS: 4 days    Ascencion Dike PA-C 01/31/2014 2:13 PM

## 2014-01-31 NOTE — Progress Notes (Signed)
Note: This document was prepared with digital dictation and possible smart phrase technology. Any transcriptional errors that result from this process are unintentional.   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  Started on Zpac by PCP, got worse  and developed toxic metabolic encephalopathy and was transferred to [facility]. She underwent CT scan abdomen/pelvis 4/14 showing 3 areas of potential abscesses, ?Vaginal dehiscence, ?P SBO and Gen surgery/IR consulted. Perc drain placed by IR 4/15 yielding clear fluid with ng as yet.  Given she seemed a little more distended, NG tube placed 4/16 with ~750 cc out bilious material Rpt Ct performed 4/16 showed a persistent fluid collection over over sigmoid colon 3.1 x 5.2 without extravasation suggestive of a leak +m ileus and Cystogram showed no leak of contrast IR was re-consulted to evalaute fluid collection   Past medical history-As per Problem list Chart reviewed as below-   Consultants:  Gen surgery  IR   Procedures:  CT scan ab  PERC drain  4/15  Antibiotics:  Vanc 4/13  Cefepime 4/13  Flagyl 4/13 l  Subjective  Sleepy.  Pain seems resolved sto some extent.  Falls asleep on my examining her but then awakens and asks when NG tube can come out and when she can eat Daughter at bedside relays she had a quiet night.     Objective    Interim History: Nursing reports some confusion  Telemetry: Sinus tach 120's   Objective: Filed Vitals:   01/31/14 0000 01/31/14 0200 01/31/14 0400 01/31/14 0600  BP: 138/53 140/42 134/52 159/54  Pulse: 110 102 103 113  Temp: 98.8  F (37.1 C)  99.5 F (37.5 C)   TempSrc: Oral  Oral   Resp: 20 19 19 21   Height:      Weight:   85.4 kg (188 lb 4.4 oz)   SpO2: 96% 94% 95% 96%    Intake/Output Summary (Last 24 hours) at 01/31/14 0906 Last data filed at 01/31/14 0600  Gross per 24 hour  Intake 1301.67 ml  Output   2305 ml  Net -1003.33 ml    Exam:  General:  EOMI, NCAT Cardiovascular: s1 s2 no m/r/g-tachy Respiratory: clear, no added sound Abdomen:  Soft, less distended than prior with no rebound/guarding Skin no Le edema Neuro intact   Data Reviewed: Basic Metabolic Panel:  Recent Labs Lab 01/27/14 2239  01/28/14 0324 01/29/14 0305 01/30/14 0317 01/31/14 0106  NA  --   --  138 138 138 137  K  --   < > 3.6* 3.3* 3.5* 3.4*  CL  --   --  103 103 100 102  CO2  --   --  23 22 22 23   GLUCOSE  --   --  145* 127* 116* 106*  BUN  --   --  13 10 10 11   CREATININE 0.99  --  0.94 0.96 1.06 0.93  CALCIUM  --   --  7.8* 7.5* 8.2* 8.2*  < > = values in this interval not displayed. Liver Function Tests:  Recent Labs Lab 01/30/14 0317 01/31/14 0106  AST 19 14  ALT 10 9  ALKPHOS 128* 92  BILITOT 0.5  0.4  PROT 6.4 5.8*  ALBUMIN 2.6* 2.3*   No results found for this basename: LIPASE, AMYLASE,  in the last 168 hours No results found for this basename: AMMONIA,  in the last 168 hours CBC:  Recent Labs Lab 01/27/14 2239 01/28/14 0324 01/29/14 0305 01/30/14 0317 01/31/14 0106  WBC 16.1* 16.3* 28.1* 32.5* 24.0*  NEUTROABS  --   --   --   --  20.7*  HGB 8.9* 9.6* 9.6* 10.3* 8.9*  HCT 27.4* 29.5* 29.0* 32.3* 27.9*  MCV 63.3* 63.4* 64.3* 63.8* 63.6*  PLT 306 355 356 426* 399   Cardiac Enzymes: No results found for this basename: CKTOTAL, CKMB, CKMBINDEX, TROPONINI,  in the last 168 hours BNP: No components found with this basename: POCBNP,  CBG: No results found for this basename: GLUCAP,  in the last 168 hours  Recent Results (from the past 240 hour(s))  MRSA PCR SCREENING     Status: None     Collection Time    01/27/14  8:56 PM      Result Value Ref Range Status   MRSA by PCR NEGATIVE  NEGATIVE Final   Comment:            The GeneXpert MRSA Assay (FDA     approved for NASAL specimens     only), is one component of a     comprehensive MRSA colonization     surveillance program. It is not     intended to diagnose MRSA     infection nor to guide or     monitor treatment for     MRSA infections.  CULTURE, BLOOD (ROUTINE X 2)     Status: None   Collection Time    01/27/14 10:39 PM      Result Value Ref Range Status   Specimen Description BLOOD BLOOD RIGHT FOREARM   Final   Special Requests BOTTLES DRAWN AEROBIC ONLY 8CC   Final   Culture  Setup Time     Final   Value: 01/28/2014 03:56     Performed at [facility]   Culture     Final   Value:        BLOOD CULTURE RECEIVED NO GROWTH TO DATE CULTURE WILL BE HELD FOR 5 DAYS BEFORE ISSUING A FINAL NEGATIVE REPORT     Performed at [facility]   Report Status PENDING   Incomplete  CULTURE, BLOOD (ROUTINE X 2)     Status: None   Collection Time    01/27/14 10:47 PM      Result Value Ref Range Status   Specimen Description BLOOD LEFT ANTECUBITAL   Final   Special Requests BOTTLES DRAWN AEROBIC AND ANAEROBIC 10CC   Final   Culture  Setup Time     Final   Value: 01/28/2014 03:56     Performed at [facility]   Culture     Final   Value:        BLOOD CULTURE RECEIVED NO GROWTH TO DATE CULTURE WILL BE HELD FOR 5 DAYS BEFORE ISSUING A FINAL NEGATIVE REPORT     Performed at [facility]   Report Status PENDING   Incomplete  URINE CULTURE     Status: None   Collection Time    01/27/14 10:53 PM      Result Value Ref Range Status   Specimen Description URINE, CLEAN CATCH   Final   Special Requests NONE   Final   Culture  Setup Time     Final   Value: 01/28/2014 05:28     Performed at [place]     Final   Value: NO GROWTH     Performed at [facility]    Culture     Final   Value: NO GROWTH     Performed at [facility]   Report Status 01/29/2014 FINAL   Final  CLOSTRIDIUM DIFFICILE BY PCR     Status: None   Collection Time    01/28/14  4:36 AM      Result Value Ref Range Status   C difficile by pcr NEGATIVE  NEGATIVE Final   Comment: Performed at [facility]  BODY FLUID CULTURE     Status: None   Collection Time    01/29/14 12:43 PM      Result Value Ref Range Status   Specimen Description FLUID PELVIC   Final   Special Requests NONE   Final   Gram Stain     Final   Value: FEW WBC PRESENT,BOTH PMN AND MONONUCLEAR     NO ORGANISMS SEEN     Performed at [facility]   Culture     Final   Value: NO GROWTH 1 DAY     Performed at [facility]   Report Status PENDING   Incomplete     Studies:              All Imaging reviewed and is as per above notation   Scheduled Meds: . acetaminophen  1,000 mg Intravenous Q6H  . ceFEPime (MAXIPIME) IV  2 g Intravenous Q24H  . enoxaparin (LOVENOX) injection  40 mg Subcutaneous Q24H  . famotidine (PEPCID) IV  20 mg Intravenous Q12H  . levothyroxine  37.5 mcg Intravenous Daily  . metoprolol  2.5-5 mg Intravenous 4 times per day  . metronidazole  500 mg Intravenous Q8H  . vancomycin  1,000 mg Intravenous Q12H   Continuous Infusions: . sodium chloride 10 mL/hr at 01/29/14 2100  . sodium chloride 50 mL/hr at 01/30/14 1822     Assessment/Plan:  1. Sepsis-likely multifactorial 2/2 to multiple abcesses + hcap/uti  Urine culture from [place] Pneumonia-current antibiotics would cover this.  .  Abscess drain culture ng x 1 day.  Unclear if this is a physiologic fluid collection [dehiscence of hysterectomy?]-could be multiple processes as has increase in WBC 20  ???32 ??? 24 on 4/17, no fever but low grade temp overnight.  NOte Ct findings 4/16 and IR re-consulted by Gen surg to re-eval what might be diverticular abcesses.  Continue broad spectrum  Abx-Cefepime/Vancomycin/Zosyn-all other non-essential meds held for now.  Appreciate IR and Surgery continued Input 2. SBO-continue NG-t for now.  Monitor output and repeat 2 vw AXR in am-if resolving may cautiously d/c the same 3. Sinus tachycardia-new finding this admission-likely related to #1.  Monitor.  Note not taking Home dose metoprolol XL 200 daily so could be rebound tachycardia.  Implement   at lower dose once not NPO.  For now continue IV metoprolol 2.5-5 mg q6 hour 4. Htn-see above discussion.  Her HCTZ/Diovan, amlodipine is also on hold 5. Hypothyroid-TSH 7.37, T4 0.74 but would not adjust her medications currently.  As she is acutely ill.  Would repeat labs in 1 month or so when more clinically stable 6. H/o Thalassemia 7. Hyperglycemia-A1c 6.0.  Monitor sugars while SDU status-ranges 106-127 8. Cervical spondylosis s.p ACDF 04/2013 9. Gerd-Continue  pantoprazole 40 iv bid-changed to IV Famotidine given critically low supply of this 10. HRT-hold Premarin 0.45 daily for now.  Code Status: Full Family Communication: d/w daughter at bedside 4/17 Disposition Plan:  SDU today  ??? Tele later today   Verneita Griffes, MD  [facility] Pager [phone] 01/31/2014, 9:06 AM    LOS: 4 days

## 2014-01-31 NOTE — Evaluation (Signed)
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 Started on Zpac by PCP, got worse and developed toxic metabolic encephalopathy and was transferred to [facility].  She underwent CT scan abdomen/pelvis 4/14 showing 3 areas of potential abscesses, ?Vaginal dehiscence, ?P SBO and Gen surgery/IR consulted.  Perc drain placed by IR 4/15 yielding clear fluid with ng as yet.  Given she seemed a little more distended, NG tube placed 4/16 with ~750 cc out bilious material   Clinical Impression  Pt pleasant and cooperative but limited by fatigue and discomfort with activity.  Pt currently requiring assist of 2 and increased time for safe performance of all mobility tasks.  Pt hopes to progress to d/c home with 24/7 assist of family and would benefit from follow up [place]     Follow Up Recommendations Home health PT    Equipment Recommendations  None recommended by PT    Recommendations for Other Services OT consult     Precautions / Restrictions Precautions Precautions: Fall Restrictions Weight Bearing Restrictions: No      Mobility  Bed Mobility Overal bed mobility: +2 for physical assistance;Needs Assistance Bed Mobility: Supine to Sit     Supine to sit: Mod assist;+2 for physical assistance     General bed mobility comments: cues and assist for log roll technique and move to upright sitting  Transfers Overall transfer level: Needs assistance Equipment used: Rolling walker (2 wheeled) Transfers: Sit to/from Stand Sit to Stand: Mod assist;+2 physical assistance         General  transfer comment: cues for transition position and use of UEs to self assist  Ambulation/Gait Ambulation/Gait assistance: Min assist;Mod assist;+2 physical assistance;+2 safety/equipment Ambulation Distance (Feet): 14 Feet Assistive device: Rolling walker (2 wheeled) Gait Pattern/deviations: Step-to pattern;Step-through pattern;Decreased step length - right;Decreased step length - left;Shuffle;Antalgic;Trunk flexed Gait velocity: decreased with multiple rests 2* fatigue/SOB   General Gait Details: cues for posture, position from [employer]            Wheelchair Mobility    Modified Rankin (Stroke Patients Only)       Balance                                             Pertinent Vitals/Pain 5/10 with movement; premed    Home Living Family/patient expects to be discharged to:: Private residence Living Arrangements: Children Available Help at Discharge: Family;Available 24 hours/day Type of Home: House Home Access: Ramped entrance     Home Layout: One level Home Equipment: Walker - 2 wheels;Grab bars - toilet;Grab bars - tub/shower;Shower seat;Bedside commode      Prior Function Level of Independence: Independent with assistive device(s)         Comments: 3 wheeled RW     Hand Dominance   Dominant Hand: Right    Extremity/Trunk Assessment   Upper Extremity Assessment: Generalized weakness           Lower Extremity Assessment: Generalized weakness      Cervical / Trunk Assessment:  Kyphotic  Communication   Communication: No difficulties  Cognition Arousal/Alertness: Awake/alert Behavior During Therapy: WFL for tasks assessed/performed Overall Cognitive Status: Within Functional Limits for tasks assessed                      General Comments      Exercises        Assessment/Plan    PT Assessment Patient needs continued PT services  PT Diagnosis Difficulty walking   PT Problem List Decreased  strength;Decreased range of motion;Decreased activity tolerance;Decreased mobility;Decreased knowledge of use of DME;Pain;Obesity;Decreased safety awareness  PT Treatment Interventions DME instruction;Gait training;Stair training;Functional mobility training;Therapeutic activities;Therapeutic exercise;Patient/family education   PT Goals (Current goals can be found in the Care Plan section) Acute Rehab PT Goals Patient Stated Goal: HOME PT Goal Formulation: With patient Time For Goal Achievement: 02/14/14 Potential to Achieve Goals: Good    Frequency Min 3X/week   Barriers to discharge        Co-evaluation               End of Session                 Time: 9753-0051 PT Time Calculation (min): 43 min   Charges:   PT Evaluation $Initial PT Evaluation Tier I: 1 Procedure PT Treatments $Gait Training: 23-37 mins $Therapeutic Activity: 8-22 mins   PT G Codes:          Mathis Fare 01/31/2014, 12:29 PM

## 2014-01-31 NOTE — Progress Notes (Signed)
ANTIBIOTIC CONSULT NOTE - FOLLOW UP  Pharmacy Consult for vancomycin Indication: pneumonia  Allergies  Allergen Reactions  . Hydrocodone     hallucinations  . Oxycodone     hallucinations  . Zoloft [Sertraline Hcl]     Burns stomach  . Ciprofloxacin Swelling  . Sulfa Antibiotics     Blisters in mouth  . Tylenol [Acetaminophen]     headaches  . Ultram [Tramadol]     headaches    Patient Measurements: Height: 5\' 2"  (157.5 cm) Weight: 188 lb 4.4 oz (85.4 kg) IBW/kg (Calculated) : 50.1 Adjusted Body Weight:   Vital Signs: Temp: 99.5 F (37.5 C) (04/17 0400) Temp src: Oral (04/17 0400) BP: 134/52 mmHg (04/17 0400) Pulse Rate: 103 (04/17 0400) Intake/Output from previous day: 04/16 0701 - 04/17 0700 In: 1221.7 [P.O.:120; I.V.:541.7; NG/GT:25; IV Piggyback:500] Out: 2215 [Urine:1440; Emesis/NG output:750; Drains:25] Intake/Output from this shift: Total I/O In: 631.7 [I.V.:431.7; Other:25; NG/GT:25; IV Piggyback:150] Out: 937 [Urine:415; Emesis/NG output:350; Drains:10]  Labs:  Recent Labs  01/29/14 0305 01/30/14 0317 01/31/14 0106  WBC 28.1* 32.5* 24.0*  HGB 9.6* 10.3* 8.9*  PLT 356 426* 399  CREATININE 0.96 1.06 0.93   Estimated Creatinine Clearance: 49.7 ml/min (by C-G formula based on Cr of 0.93).  Recent Labs  01/31/14 0107  [place] 14.4     Microbiology: Recent Results (from the past 720 hour(s))  MRSA PCR SCREENING     Status: None   Collection Time    01/27/14  8:56 PM      Result Value Ref Range Status   MRSA by PCR NEGATIVE  NEGATIVE Final   Comment:            The GeneXpert MRSA Assay (FDA     approved for NASAL specimens     only), is one component of a     comprehensive MRSA colonization     surveillance program. It is not     intended to diagnose MRSA     infection nor to guide or     monitor treatment for     MRSA infections.  CULTURE, BLOOD (ROUTINE X 2)     Status: None   Collection Time    01/27/14 10:39 PM      Result  Value Ref Range Status   Specimen Description BLOOD BLOOD RIGHT FOREARM   Final   Special Requests BOTTLES DRAWN AEROBIC ONLY 8CC   Final   Culture  Setup Time     Final   Value: 01/28/2014 03:56     Performed at [facility]   Culture     Final   Value:        BLOOD CULTURE RECEIVED NO GROWTH TO DATE CULTURE WILL BE HELD FOR 5 DAYS BEFORE ISSUING A FINAL NEGATIVE REPORT     Performed at [facility]   Report Status PENDING   Incomplete  CULTURE, BLOOD (ROUTINE X 2)     Status: None   Collection Time    01/27/14 10:47 PM      Result Value Ref Range Status   Specimen Description BLOOD LEFT ANTECUBITAL   Final   Special Requests BOTTLES DRAWN AEROBIC AND ANAEROBIC 10CC   Final   Culture  Setup Time     Final   Value: 01/28/2014 03:56     Performed at [facility]   Culture     Final   Value:        BLOOD CULTURE RECEIVED NO GROWTH TO  DATE CULTURE WILL BE HELD FOR 5 DAYS BEFORE ISSUING A FINAL NEGATIVE REPORT     Performed at [facility]   Report Status PENDING   Incomplete  URINE CULTURE     Status: None   Collection Time    01/27/14 10:53 PM      Result Value Ref Range Status   Specimen Description URINE, CLEAN CATCH   Final   Special Requests NONE   Final   Culture  Setup Time     Final   Value: 01/28/2014 05:28     Performed at [place]     Final   Value: NO GROWTH     Performed at [facility]   Culture     Final   Value: NO GROWTH     Performed at [facility]   Report Status 01/29/2014 FINAL   Final  CLOSTRIDIUM DIFFICILE BY PCR     Status: None   Collection Time    01/28/14  4:36 AM      Result Value Ref Range Status   C difficile by pcr NEGATIVE  NEGATIVE Final   Comment: Performed at [facility]  BODY FLUID CULTURE     Status: None   Collection Time    01/29/14 12:43 PM      Result Value Ref Range Status   Specimen Description FLUID PELVIC   Final   Special Requests NONE    Final   Gram Stain     Final   Value: FEW WBC PRESENT,BOTH PMN AND MONONUCLEAR     NO ORGANISMS SEEN     Performed at [facility]   Culture     Final   Value: NO GROWTH 1 DAY     Performed at [facility]   Report Status PENDING   Incomplete    Anti-infectives   Start     Dose/Rate Route Frequency Ordered Stop   01/31/14 1000  vancomycin (VANCOCIN) IVPB 1000 mg/200 mL premix     1,000 mg 200 mL/hr over 60 Minutes Intravenous Every 12 hours 01/31/14 0228     01/31/14 0600  ceFEPIme (MAXIPIME) 2 g in dextrose 5 % 50 mL IVPB     2 g 100 mL/hr over 30 Minutes Intravenous Every 24 hours 01/30/14 1059     01/28/14 1800  metroNIDAZOLE (FLAGYL) IVPB 500 mg     500 mg 100 mL/hr over 60 Minutes Intravenous Every 8 hours 01/28/14 1725     01/28/14 1200  vancomycin (VANCOCIN) IVPB 750 mg/150 ml premix  Status:  Discontinued     750 mg 150 mL/hr over 60 Minutes Intravenous Every 12 hours 01/27/14 2158 01/31/14 0228   01/28/14 0200  vancomycin (VANCOCIN) IVPB 750 mg/150 ml premix     750 mg 150 mL/hr over 60 Minutes Intravenous  Once 01/27/14 2158 01/28/14 0336   01/27/14 2200  ceFEPIme (MAXIPIME) 1 g in dextrose 5 % 50 mL IVPB  Status:  Discontinued     1 g 100 mL/hr over 30 Minutes Intravenous 3 times per day 01/27/14 2127 01/30/14 1058      Assessment: Patient with low vancomycin level and prior doses off schedule, which should have resulted in higher level.  Goal of Therapy:  Vancomycin trough level 15-20 mcg/ml  Plan:  Measure antibiotic drug levels at steady state Follow up culture results Change to 1gm iv vancomycin q12hr  Laura Frey. 01/31/2014,4:57 AM

## 2014-01-31 NOTE — Consult Note (Signed)
Consultation  Referring Provider: [facility] (Dr. Verlon Au)     Primary Care Physician:  No PCP Per Patient Primary Gastroenterologist:  Dr. Vira Agar [facility])       Reason for Consultation: Abnormal CTscan             HPI:   AALIJAH ALICEA is a 80 y.o. female transferred from [facility] 4 days ago with possible sepsis. She iis being treated for HCAP but CTscan ordered a couple of days ago for evaluation of abdominal tenderness and revealed small amount of gas and 3 fluid collections in pelvis (between rectum and bladder), sigmoid diverticular disease , and psbo vrs ileus. Patient has a history of recurrent UTIs and had some sort of urologic studies done one month ago. These were apparently just urodynamic studies, no instrumentation was done. Surgery is following, doesn't feel this is result of a perforation. IR placed a right drain 2 days ago. So growth on fluid culture at 2 days.  Urine culture is negative as well.   Patient cannot remember recent details very well. Two sons at bedside and help with history. One of the sons resides with patient and recalls her mentioning right sided abdominal pain approximately two weeks ago. He attributed pain to her normal urologic pain. Patient has had several colonoscopies at [facility] through the years but cannot remember date of last one. She denies any history of diverticulitis.   Patient continues to cough. CXR reveals improving right middle lobe atelectasis  slight pulmonary vascular congestion. Tiny bilateral effusions  Past Medical History  Diagnosis Date  . Hypertension   . Hypothyroidism   . Depression   . Asthma   . Peripheral vascular disease     legs  . GERD (gastroesophageal reflux disease)   . Cancer     melanoma on back  . Anemia     hx Thalassemia minor anemia  . Tendinitis of both rotator cuffs   . Chronic back pain     ESI/uses BC powders    Past Surgical History  Procedure Laterality Date  . Tubal  ligation    . Appendectomy    . Tonsillectomy    . Eye surgery Bilateral     cataracts  . Abdominal hysterectomy      vagina  . Bladder repair      after hysterectomy same day  . Anterior cervical decomp/discectomy fusion N/A 05/01/2013    Procedure: ANTERIOR CERVICAL DECOMPRESSION/DISCECTOMY FUSION 3 LEVELS;  Surgeon: Elaina Hoops, MD;  Location: [facility];  Service: Neurosurgery;  Laterality: N/A;  ANTERIOR CERVICAL DECOMPRESSION/DISCECTOMY FUSION 3 LEVELS    Family History  Problem Relation Age of Onset  . Thalassemia Son   . Thalassemia Paternal Aunt   No colon cancer.  Mother :   ? Diverticular disease  History  Substance Use Topics  . Smoking status: Former Smoker -- 1.00 packs/day for 30 years    Types: Cigarettes    Quit date: 04/30/1995  . Smokeless tobacco: Never Used  . Alcohol Use: No    Prior to Admission medications   Medication Sig Start Date End Date Taking? Authorizing Provider  albuterol (PROVENTIL HFA;VENTOLIN HFA) 108 (90 BASE) MCG/ACT inhaler Inhale 2 puffs into the lungs every 6 (six) hours as needed for wheezing.   Yes Historical Provider, MD  amLODipine (NORVASC) 5 MG tablet Take 5 mg by mouth daily.   Yes Historical Provider, MD  Aspirin-Caffeine (BC FAST PAIN RELIEF ARTHRITIS PO) Take 1  packet by mouth as needed (for pain).   Yes Historical Provider, MD  Cholecalciferol (VITAMIN D) 2000 UNITS tablet Take 2,000 Units by mouth daily.   Yes Historical Provider, MD  estrogens, conjugated, (PREMARIN) 0.45 MG tablet Take 0.45 mg by mouth daily.    Yes Historical Provider, MD  fexofenadine (ALLEGRA) 180 MG tablet Take 180 mg by mouth daily.   Yes Historical Provider, MD  guaifenesin (ROBITUSSIN) 100 MG/5ML syrup Take 200 mg by mouth 3 (three) times daily as needed for cough.   Yes Historical Provider, MD  levothyroxine (SYNTHROID, LEVOTHROID) 75 MCG tablet Take 75 mcg by mouth daily before breakfast.   Yes Historical Provider, MD  magnesium oxide (MAG-OX)  400 MG tablet Take 400 mg by mouth daily.   Yes Historical Provider, MD  metoprolol (TOPROL-XL) 200 MG 24 hr tablet Take 200 mg by mouth daily.   Yes Historical Provider, MD  Multiple Vitamin (MULTIVITAMIN WITH MINERALS) TABS Take 1 tablet by mouth daily.   Yes Historical Provider, MD  omeprazole (PRILOSEC) 20 MG capsule Take 20 mg by mouth daily.   Yes Historical Provider, MD  potassium chloride (K-DUR,KLOR-CON) 10 MEQ tablet Take 10 mEq by mouth daily.   Yes Historical Provider, MD  solifenacin (VESICARE) 5 MG tablet Take 5 mg by mouth daily.   Yes Historical Provider, MD  valsartan-hydrochlorothiazide (DIOVAN-HCT) 320-12.5 MG per tablet Take 1 tablet by mouth daily.   Yes Historical Provider, MD  vitamin C (ASCORBIC ACID) 500 MG tablet Take 500 mg by mouth daily.   Yes Historical Provider, MD  VITAMIN E PO Take 500 Units by mouth daily.   Yes Historical Provider, MD    Current Facility-Administered Medications  Medication Dose Route Frequency Provider Last Rate Last Dose  . 0.9 %  sodium chloride infusion   Intravenous Continuous Nita Sells, MD 10 mL/hr at 01/29/14 2100    . 0.9 %  sodium chloride infusion   Intravenous Continuous Nita Sells, MD 50 mL/hr at 01/30/14 1822    . acetaminophen (OFIRMEV) IV 1,000 mg  1,000 mg Intravenous Q6H Nita Sells, MD      . albuterol (PROVENTIL) (2.5 MG/3ML) 0.083% nebulizer solution 2.5 mg  2.5 mg Nebulization Q6H PRN Ripudeep K Rai, MD   2.5 mg at 01/30/14 1249  . ceFEPIme (MAXIPIME) 2 g in dextrose 5 % 50 mL IVPB  2 g Intravenous Q24H Nita Sells, MD   2 g at 01/31/14 0600  . enoxaparin (LOVENOX) injection 40 mg  40 mg Subcutaneous Q24H Ripudeep Krystal Eaton, MD   40 mg at 01/30/14 2058  . famotidine (PEPCID) IVPB 20 mg  20 mg Intravenous Q12H Nita Sells, MD   20 mg at 01/31/14 0944  . fentaNYL (SUBLIMAZE) injection 25-50 mcg  25-50 mcg Intravenous Q2H PRN Nita Sells, MD      . levothyroxine (SYNTHROID,  LEVOTHROID) injection 37.5 mcg  37.5 mcg Intravenous Daily Nita Sells, MD   37.5 mcg at 01/31/14 0939  . metoprolol (LOPRESSOR) injection 2.5-5 mg  2.5-5 mg Intravenous 4 times per day Nita Sells, MD   5 mg at 01/31/14 0610  . metroNIDAZOLE (FLAGYL) IVPB 500 mg  500 mg Intravenous Q8H Domenic Polite, MD   500 mg at 01/31/14 1021  . phenol (CHLORASEPTIC) mouth spray 1 spray  1 spray Mouth/Throat PRN Dianne Dun, NP      . vancomycin (VANCOCIN) IVPB 1000 mg/200 mL premix  1,000 mg Intravenous Q12H Nita Sells, MD   1,000 mg at 01/31/14  1158    Allergies as of 01/27/2014 - Review Complete 01/27/2014  Allergen Reaction Noted  . Hydrocodone  05/02/2013  . Oxycodone  05/02/2013  . Zoloft [sertraline hcl]  01/27/2014  . Ciprofloxacin Swelling 04/29/2013  . Sulfa antibiotics  04/29/2013  . Tylenol [acetaminophen]  05/02/2013  . Ultram [tramadol]  05/02/2013    Review of Systems:    All systems reviewed and negative except where noted in HPI.   Physical Exam:  Vital signs in last 24 hours: Temp:  [98 F (36.7 C)-99.5 F (37.5 C)] 98 F (36.7 C) (04/17 1132) Pulse Rate:  [102-113] 104 (04/17 1402) Resp:  [19-24] 20 (04/17 1402) BP: (125-159)/(33-72) 125/49 mmHg (04/17 1402) SpO2:  [94 %-100 %] 97 % (04/17 1402) Weight:  [188 lb 4.4 oz (85.4 kg)] 188 lb 4.4 oz (85.4 kg) (04/17 0400) Last BM Date: 01/31/14 General:   Pleasant white female in NAD Head:  Normocephalic and atraumatic. Eyes:   No icterus.   Conjunctiva pink. Ears:  Normal auditory acuity. Neck:  Supple; no masses felt Lungs:  Respirations even and unlabored. Lungs clear to auscultation bilaterally.  No wheezes, crackles, or rhonchi.  Heart:  Slightly tachycardic Abdomen:  Soft, nondistended, mild right mid abdominal tenderness. Normal bowel sounds. No appreciable masses or hepatomegaly.  Rectal:  Not performed.  Msk:  Symmetrical without gross deformities.  Extremities:  Without  edema. Neurologic:  Alert and oriented x4;  grossly normal neurologically. Slow to remember recent details. Skin:  Intact without significant lesions or rashes. Cervical Nodes:  No significant cervical adenopathy. Psych:  Alert and cooperative. Normal affect.  LAB RESULTS:  Recent Labs  01/29/14 0305 01/30/14 0317 01/31/14 0106  WBC 28.1* 32.5* 24.0*  HGB 9.6* 10.3* 8.9*  HCT 29.0* 32.3* 27.9*  PLT 356 426* 399   BMET  Recent Labs  01/29/14 0305 01/30/14 0317 01/31/14 0106  NA 138 138 137  K 3.3* 3.5* 3.4*  CL 103 100 102  CO2 22 22 23   GLUCOSE 127* 116* 106*  BUN 10 10 11   CREATININE 0.96 1.06 0.93  CALCIUM 7.5* 8.2* 8.2*   LFT  Recent Labs  01/31/14 0106  PROT 5.8*  ALBUMIN 2.3*  AST 14  ALT 9  ALKPHOS 92  BILITOT 0.4   PT/INR  Recent Labs  01/29/14 1246 01/30/14 0317  LABPROT 16.0* 16.0*  INR 1.31 1.31    STUDIES: Ct Pelvis Wo Contrast  01/30/2014   CLINICAL DATA:  Pelvic abscess of unclear etiology. Rectal contrast given to evaluate for leak/fistula.  EXAM: CT PELVIS WITHOUT CONTRAST  TECHNIQUE: Multidetector CT imaging of the pelvis was performed following the standard protocol without intravenous contrast. Rectal contrast was administered via rectal catheter. Patient had difficulty keeping balloon catheter in the rectum.  COMPARISON:  01/28/2014  FINDINGS: Foley catheter is present within a collapsed bladder. Rectal catheter is in place with contrast filling the rectosigmoid colon and remainder of the visualized colon. There is no evidence of contrast extravasation/leak. B fluid collection just lateral and inferior to the sigmoid colon in the left pelvis is unchanged to slightly larger measuring 3 x 5.2 cm with less air within the fluid collection. There is a right gluteal pigtail drainage catheter with pigtail over the right pelvis as there has been resolution of the deep pelvic fluid collections in the region of the vaginal cuff and right perirectal  region. There is a continued fluid collection with a few small air-fluid levels over the right upper pelvis without significant  change as this measures 4.3 x 4.9 cm. The pigtail drainage catheter is immediately posterior and slightly inferior to this persistent fluid collection. No evidence of free air. There are persistent air-filled mildly dilated small bowel loops likely secondary ileus to this inflammatory/infectious process. There is moderate diverticulosis of the colon. Remainder the exam is unchanged.  IMPRESSION: Right gluteal pigtail drainage catheter in place with pigtail just right of midline and interval resolution of the fluid collections over the right perirectal region and vaginal cuff. Persistent fluid collection over the left pelvis inferior lateral to the adjacent sigmoid colon with slight increase in size measuring 3.1 x 5.2 cm. Stable fluid collection with a few small air-fluid levels over the right upper pelvis. The adjacent rectosigmoid colon is well opacified with contrast as there is no evidence of contrast extravasation/ leak or fistula.  Continued persistent dilated fluid and air-filled small bowel loops likely secondary ileus to the pelvic infectious/inflammatory process.  Diverticulosis of the colon.   Electronically Signed   By: Marin Olp M.D.   On: 01/30/2014 17:11   Dg Cystogram  01/30/2014   CLINICAL DATA:  Pelvic abscesses.  EXAM: CYSTOGRAM  TECHNIQUE: After catheterization of the urinary bladder following sterile technique the bladder was filled with 300 mL Cysto-Hypaque 30% by drip infusion. Serial spot images were obtained during bladder filling and post draining.  FLUOROSCOPY TIME:  2 min 9 seconds slow pulsed fluoroscopy adjusted for body habitus  COMPARISON:  CT scans dated 04/14 and 01/30/2014  FINDINGS: Contrast was instilled through the indwelling Foley catheter. Contrast is seen in the colon from the previous CT scan. I placed the patient in the lateral projection  and filled the bladder. Multiple radiographs were then obtained in multiple projections which demonstrate no evidence of extravasation of contrast. No visible fistula to the bowel.  Abscess drainage catheter is noted. Post drainage radiograph demonstrates no contrast in the abscess drainage catheter.  IMPRESSION: Normal cystogram.   Electronically Signed   By: Rozetta Nunnery M.D.   On: 01/30/2014 17:45   Dg Chest Port 1 View  01/29/2014   CLINICAL DATA:  Shortness of breath and weakness.  EXAM: PORTABLE CHEST - 1 VIEW  COMPARISON:  01/28/2014.  FINDINGS: The cardiac silhouette, mediastinal and hilar contours are stable. Slightly lower lung volumes with vascular crowding and atelectasis. The right lower lobe process appears slightly worse but this is largely due to the lower lung volumes. No pneumothorax or pleural effusion.  IMPRESSION: Lower lung volumes with increase in vascular crowding and bibasilar atelectasis, right greater than left.   Electronically Signed   By: Kalman Jewels M.D.   On: 01/29/2014 19:21   Dg Abd Acute W/chest  01/30/2014   CLINICAL DATA:  Shortness of breath.  Diffuse abdominal pain.  EXAM: ACUTE ABDOMEN SERIES (ABDOMEN 2 VIEW & CHEST 1 VIEW)  COMPARISON:  Chest x-ray dated 01/29/2014 and CT scan dated 01/28/2014  FINDINGS: Right middle lobe atelectasis has improved. There is still slight pulmonary vascular congestion. Slight linear atelectasis at the left lung base. Tiny bilateral effusions.  No free air in the abdomen. Proximal small bowel remains dilated. Contrast has passed into the nondistended colon. Abscess drainage catheter present in the right side of the pelvis.  IMPRESSION: Persistent pattern of partial small bowel obstruction. Contrast has moved into the nondistended colon.   Electronically Signed   By: Rozetta Nunnery M.D.   On: 01/30/2014 13:30   PREVIOUS ENDOSCOPIES:  several colonoscopies at [place] / Plan:   57. 80 year old female with  leukocytosis and CTscan revealing fluid collections / gas in pelvic area.  On imaging studies there is no extravasation from bladder or bowel to imply perforation. She is s/p righ transgluteal drain placement by IR and cultures negative at day two. Cytology = reactive / acute inflammation. Repeat CTscan yesterday shows persistent fluid collect adjacent to sigmoid which is not amenable to drainage per IR.  WBC down from 32 to 24 today. This could have been result of perforated diverticulum, difficult to know for sure. No role for colonoscopy in setting of acute inflammation.   Dr. Fuller Plan to review films today.   2. Partial SBO vrs ileus. This could be ileus related to illness / infection. She isn't having any nausea or vomiting.  If this doesn't resolve she will need eventual workup, possible small bowel follow through.   Thanks   LOS: 4 days   Willia Craze  01/31/2014, 2:43 PM      Attending physician's note   I have taken a history, examined the patient and reviewed the chart. I agree with the Advanced Practitioner's note, impression and recommendations. Pelvic fluid collections with a secondary SB ileus. Etiology of fluid collections is not clear. This does not appear to be a GI process but it is possible that she had a perforated diverticulum that has healed. She had recent instrumentation by Urology. I agree with the recommendations from Surgical consult-percutaneous drainage of areas that are amenable to drainage and IV antibiotics. Consider clamping NGT if output remains low. Consider consulting Urology and GYN for opinions.   Ladene Artist, MD [facility]

## 2014-01-31 NOTE — Progress Notes (Signed)
CARE MANAGEMENT NOTE 01/31/2014  Patient:  NIAMYA, VITTITOW   Account Number:  [id number]  Date Initiated:  01/28/2014  Documentation initiated by:  DAVIS,RHONDA  Subjective/Objective Assessment:   sepsis, poss high risk for intubation and requirement of pressors     Action/Plan:   home when stable   Anticipated DC Date:  02/03/2014   Anticipated DC Plan:  HOME/SELF CARE  In-house referral  NA      DC Planning Services  NA      PAC Choice  NA  NA   Choice offered to / List presented to:  NA   DME arranged  NA      DME agency  NA     [place] arranged  NA      [place] agency  NA   Status of service:  In process, will continue to follow Medicare Important Message given?  NA - LOS <3 / Initial given by admissions (If response is "NO", the following Medicare IM given date fields will be blank) Date Medicare IM given:   Date Additional Medicare IM given:    Discharge Disposition:    Per UR Regulation:  Reviewed for med. necessity/level of care/duration of stay  If discussed at [place] of Stay Meetings, dates discussed:    Comments:  [id number]/Rhonda Eldridge Dace, [place], [place] [phone] Chart Reviewed for discharge and hospital needs. Discharge needs at time of review: None present will follow for needs. Review of patient progress due on [id number]. npo, ileus  to be transferred to acute tele bed later on [id number]  [id number]/Rhonda Eldridge Dace, [place], [place] [phone] Chart Reviewed for discharge and hospital needs. Discharge needs at time of review: None present will follow for needs. Review of patient progress due on [id number].

## 2014-01-31 NOTE — Progress Notes (Signed)
CSW continuing to follow for Advanced Directives.  CSW met with pt and pt son at bedside. Pt resting at this time. Pt son reports that pt has not felt very well so they have not looked further at the [place]. CSW provided support and expressed understanding.   CSW notified pt son that if pt is interested in assistance with advanced directives assistance during the weekend to notify the RN in order for RN to contact the weekend social worker.  CSW to continue to follow to assist with Advanced Directives.  Alison Murray, MSW, [place] [phone]

## 2014-01-31 NOTE — Care Management Note (Addendum)
    Page 1 of 2   02/04/2014     11:52:47 AM CARE MANAGEMENT NOTE 02/04/2014  Patient:  Laura Frey, Laura Frey   Account Number:  [id number]  Date Initiated:  01/28/2014  Documentation initiated by:  DAVIS,Laura  Subjective/Objective Assessment:   sepsis, poss high risk for intubation and requirement of pressors     Action/Plan:   home when stable   Anticipated DC Date:  02/04/2014   Anticipated DC Plan:  Laura Frey  In-house referral  NA      DC Planning Services  CM consult      PAC Choice  NA  NA   Choice offered to / List presented to:  C-1 Patient   DME arranged  NA      DME agency  NA     [place] arranged  [place].   Status of service:  Completed, signed off Medicare Important Message given?  NA - LOS <3 / Initial given by admissions (If response is "NO", the following Medicare IM given date fields will be blank) Date Medicare IM given:   Date Additional Medicare IM given:    Discharge Disposition:  Laura Frey  Per UR Regulation:  Reviewed for med. necessity/level of care/duration of stay  If discussed at Laura Frey of Stay Meetings, dates discussed:   02/04/2014    Comments:  02/04/14 Laura Kopko RN,BSN NCM [phone] D/C HOME [place] D/C & Laura Frey.  02/03/14 Laura Huether RN,BSN NCM [phone] [facility] FOLLOWING FOR D/C.AWAIT FINAL HHPT ORDER.POD#5.D/C DRAIN,IV PEPCID Q12,K 2.6,SINUS TACH-112,IV LOPRESSOR X1.  01/31/14 Laura Sublett RN,BSN NCM [phone] TRANSFER FROM SDU.PSBO,SEPSIS.POD#2 R PELVIC DRAIN,NGT,TACHYCARDIA,IV ABX X3.IR FOLLOWING.PT-HH.[facility] CHOSEN FOR HHPT.TC Laura Frey AWARE OF REFERRAL.AWAIT FINAL HHPT ORDER.  [place], RN, BSN, [place] [phone] Chart Reviewed for discharge and hospital needs. Discharge needs at time of review: None present will follow for needs. Review of patient progress due on  [id number]. npo, ileus  to be transferred to acute tele bed later on [id number]  [id number]/Laura Frey, [place], [place] [phone] Chart Reviewed for discharge and hospital needs. Discharge needs at time of review: None present will follow for needs. Review of patient progress due on [id number].

## 2014-01-31 NOTE — Progress Notes (Signed)
Subjective: She says she slept better last PM, tired after all the studies yesterday and would like a day of rest.  Less distended this AM.  Again no signs of peritonitis on exam.  She is sleeping on her side and no real discomfort.  Objective: Vital signs in last 24 hours: Temp:  [98.5 F (36.9 C)-99.5 F (37.5 C)] 99.5 F (37.5 C) (04/17 0400) Pulse Rate:  [102-117] 113 (04/17 0600) Resp:  [19-24] 21 (04/17 0600) BP: (113-159)/(33-61) 159/54 mmHg (04/17 0600) SpO2:  [94 %-100 %] 96 % (04/17 0600) Weight:  [85.4 kg (188 lb 4.4 oz)] 85.4 kg (188 lb 4.4 oz) (04/17 0400) Last BM Date: 01/30/14 120 PO recorded 750 from NG 25 ml from the drain No fever since 2300 hours, on 01/29/14 HR in the 100-120's;  BP OK Labs 1 AM K+ 3.4 WBC id down to 24K H/H is down  Cystogram 01/30/14:  was normal/no extravasation, no fistula to the bowel. CT pelvis 01/30/14: 1.  Resolution of the fluid collection with the current drain. right of midline and interval resolution of the fluid collections over the right perirectal region and vaginal cuff.  2. Persistent fluid collection over the left pelvis inferior lateral to the adjacent sigmoid colon with slight increase in size measuring 3.1 x 5.2 cm. 3.  Stable fluid collection with a few small air-fluid levels over the right upper pelvis. The adjacent rectosigmoid colon is well opacified with contrast as there is no evidence of contrast extravasation/ leak or fistula. 4. Persistent dilated fluid levels in SB, likely secondary ileus to the pelvi infectious/inflammatory process.   Intake/Output from previous day: 04/16 0701 - 04/17 0700 In: 1431.7 [P.O.:120; I.V.:691.7; NG/GT:25; IV Piggyback:550] Out: 2305 [Urine:1530; Emesis/NG output:750; Drains:25] Intake/Output this shift:    General appearance: alert, cooperative and no distress GI: soft,  somewhat distended, few hyperactive bowel sounds, she isn't complaining of tenderness right now, but has  recently had some pain medicine.  Lab Results:   Recent Labs  01/30/14 0317 01/31/14 0106  WBC 32.5* 24.0*  HGB 10.3* 8.9*  HCT 32.3* 27.9*  PLT 426* 399    BMET  Recent Labs  01/30/14 0317 01/31/14 0106  NA 138 137  K 3.5* 3.4*  CL 100 102  CO2 22 23  GLUCOSE 116* 106*  BUN 10 11  CREATININE 1.06 0.93  CALCIUM 8.2* 8.2*   PT/INR  Recent Labs  01/29/14 1246 01/30/14 0317  LABPROT 16.0* 16.0*  INR 1.31 1.31     Recent Labs Lab 01/30/14 0317 01/31/14 0106  AST 19 14  ALT 10 9  ALKPHOS 128* 92  BILITOT 0.5 0.4  PROT 6.4 5.8*  ALBUMIN 2.6* 2.3*     Lipase  No results found for this basename: lipase     Studies/Results: Ct Pelvis Wo Contrast  01/30/2014   CLINICAL DATA:  Pelvic abscess of unclear etiology. Rectal contrast given to evaluate for leak/fistula.  EXAM: CT PELVIS WITHOUT CONTRAST  TECHNIQUE: Multidetector CT imaging of the pelvis was performed following the standard protocol without intravenous contrast. Rectal contrast was administered via rectal catheter. Patient had difficulty keeping balloon catheter in the rectum.  COMPARISON:  01/28/2014  FINDINGS: Foley catheter is present within a collapsed bladder. Rectal catheter is in place with contrast filling the rectosigmoid colon and remainder of the visualized colon. There is no evidence of contrast extravasation/leak. B fluid collection just lateral and inferior to the sigmoid colon in the left pelvis is unchanged to slightly larger  measuring 3 x 5.2 cm with less air within the fluid collection. There is a right gluteal pigtail drainage catheter with pigtail over the right pelvis as there has been resolution of the deep pelvic fluid collections in the region of the vaginal cuff and right perirectal region. There is a continued fluid collection with a few small air-fluid levels over the right upper pelvis without significant change as this measures 4.3 x 4.9 cm. The pigtail drainage catheter is  immediately posterior and slightly inferior to this persistent fluid collection. No evidence of free air. There are persistent air-filled mildly dilated small bowel loops likely secondary ileus to this inflammatory/infectious process. There is moderate diverticulosis of the colon. Remainder the exam is unchanged.  IMPRESSION: Right gluteal pigtail drainage catheter in place with pigtail just right of midline and interval resolution of the fluid collections over the right perirectal region and vaginal cuff. Persistent fluid collection over the left pelvis inferior lateral to the adjacent sigmoid colon with slight increase in size measuring 3.1 x 5.2 cm. Stable fluid collection with a few small air-fluid levels over the right upper pelvis. The adjacent rectosigmoid colon is well opacified with contrast as there is no evidence of contrast extravasation/ leak or fistula.  Continued persistent dilated fluid and air-filled small bowel loops likely secondary ileus to the pelvic infectious/inflammatory process.  Diverticulosis of the colon.   Electronically Signed   By: Marin Olp M.D.   On: 01/30/2014 17:11   Dg Cystogram  01/30/2014   CLINICAL DATA:  Pelvic abscesses.  EXAM: CYSTOGRAM  TECHNIQUE: After catheterization of the urinary bladder following sterile technique the bladder was filled with 300 mL Cysto-Hypaque 30% by drip infusion. Serial spot images were obtained during bladder filling and post draining.  FLUOROSCOPY TIME:  2 min 9 seconds slow pulsed fluoroscopy adjusted for body habitus  COMPARISON:  CT scans dated 04/14 and 01/30/2014  FINDINGS: Contrast was instilled through the indwelling Foley catheter. Contrast is seen in the colon from the previous CT scan. I placed the patient in the lateral projection and filled the bladder. Multiple radiographs were then obtained in multiple projections which demonstrate no evidence of extravasation of contrast. No visible fistula to the bowel.  Abscess drainage  catheter is noted. Post drainage radiograph demonstrates no contrast in the abscess drainage catheter.  IMPRESSION: Normal cystogram.   Electronically Signed   By: Rozetta Nunnery M.D.   On: 01/30/2014 17:45   Dg Chest Port 1 View  01/29/2014   CLINICAL DATA:  Shortness of breath and weakness.  EXAM: PORTABLE CHEST - 1 VIEW  COMPARISON:  01/28/2014.  FINDINGS: The cardiac silhouette, mediastinal and hilar contours are stable. Slightly lower lung volumes with vascular crowding and atelectasis. The right lower lobe process appears slightly worse but this is largely due to the lower lung volumes. No pneumothorax or pleural effusion.  IMPRESSION: Lower lung volumes with increase in vascular crowding and bibasilar atelectasis, right greater than left.   Electronically Signed   By: Kalman Jewels M.D.   On: 01/29/2014 19:21   Dg Abd Acute W/chest  01/30/2014   CLINICAL DATA:  Shortness of breath.  Diffuse abdominal pain.  EXAM: ACUTE ABDOMEN SERIES (ABDOMEN 2 VIEW & CHEST 1 VIEW)  COMPARISON:  Chest x-ray dated 01/29/2014 and CT scan dated 01/28/2014  FINDINGS: Right middle lobe atelectasis has improved. There is still slight pulmonary vascular congestion. Slight linear atelectasis at the left lung base. Tiny bilateral effusions.  No free air in the  abdomen. Proximal small bowel remains dilated. Contrast has passed into the nondistended colon. Abscess drainage catheter present in the right side of the pelvis.  IMPRESSION: Persistent pattern of partial small bowel obstruction. Contrast has moved into the nondistended colon.   Electronically Signed   By: Rozetta Nunnery M.D.   On: 01/30/2014 13:30   Ct Image Guided Drainage Percut Cath  Peritoneal Retroperit  01/29/2014   CLINICAL DATA:  Pelvic abscess  EXAM: CT IMAGE GUIDED DRAINAGE PERCUT CATH  PERITONEAL RETROPERIT  FLUOROSCOPY TIME:  None minutes  MEDICATIONS AND MEDICAL HISTORY: Versed 1 mg, Fentanyl 50 mcg.  Additional Medications: None.  ANESTHESIA/SEDATION:  Moderate sedation time: 20 minutes  CONTRAST:  None  PROCEDURE: The procedure, risks, benefits, and alternatives were explained to the patient. Questions regarding the procedure were encouraged and answered. The patient understands and consents to the procedure.  The right gluteal region in the prone position was prepped with Betadine in a sterile fashion, and a sterile drape was applied covering the operative field. A sterile gown and sterile gloves were used for the procedure.  Under CT guidance, an 18 gauge needle was inserted into the pelvic abscess via a right trans gluteal approach. It was removed over an Amplatz wire. A 12 French dilator followed by a 12 [place] drain were inserted. It was looped and string fixed in the fluid collection. Slightly cloudy yellow fluid was aspirated.  FINDINGS: Images document 21 French drain placement into a pelvic abscess via right trans gluteal approach  COMPLICATIONS: None  IMPRESSION: Successful right trans gluteal pelvic abscess drainage.   Electronically Signed   By: Maryclare Bean M.D.   On: 01/29/2014 13:33    Medications: . ceFEPime (MAXIPIME) IV  2 g Intravenous Q24H  . enoxaparin (LOVENOX) injection  40 mg Subcutaneous Q24H  . famotidine (PEPCID) IV  20 mg Intravenous Q12H  . levothyroxine  37.5 mcg Intravenous Daily  . metoprolol  2.5-5 mg Intravenous 4 times per day  . metronidazole  500 mg Intravenous Q8H  . vancomycin  1,000 mg Intravenous Q12H    Assessment/Plan S/p right transgluteal abscess drain 01/29/14. (simple serous fluid. Cr on fluid was 1. Doesn't appear to be c/w intestinal source. F/u cx from aspiration. Could just be sterile fluid collection.)  Cultures show no growth so far, C diff ws negative  2. Admitted with: SOB, Cough, fever, confusion, weakness,fever,  3. Right basilar infiltrates (bilateral effusions on CT)  4. UTI  5. Possible sepsis with hypotension  6. Hx of tobacco use  7. Hx of hypertension  8. Hx of hypothyroid  9. Hx of  Thalassemia  10. Hx of back pain with cervical decompression 04/2013  11. Hx of abdominal hysterectomy and bladder repair, 1978. Recent evaluation by Urology with instrumentation in their office this year.   Plan:  Continue antibiotics (pelvic fluid no growth so far, a few WBC)  No extravasation from bladder or bowel, so we don't believe she has some type of perforation, and the one site we have drained does not appear to be a source of infection.  We have ask IR to look at CT again and see if the collection adjacent to the Sigmoid is amenable to drain placement.  IR says new drain placement is not currently possible.     LOS: 4 days    Earnstine Regal 01/31/2014

## 2014-02-01 ENCOUNTER — Inpatient Hospital Stay (HOSPITAL_COMMUNITY): Payer: Medicare Other

## 2014-02-01 DIAGNOSIS — K5289 Other specified noninfective gastroenteritis and colitis: Secondary | ICD-10-CM

## 2014-02-01 DIAGNOSIS — A419 Sepsis, unspecified organism: Secondary | ICD-10-CM

## 2014-02-01 LAB — CREATININE, FLUID (PLEURAL, PERITONEAL, JP DRAINAGE): Creat, Fluid: 0.7 mg/dL

## 2014-02-01 LAB — CBC WITH DIFFERENTIAL/PLATELET
BASOS PCT: 0 % (ref 0–1)
Basophils Absolute: 0 10*3/uL (ref 0.0–0.1)
EOS PCT: 2 % (ref 0–5)
Eosinophils Absolute: 0.4 10*3/uL (ref 0.0–0.7)
HCT: 26.3 % — ABNORMAL LOW (ref 36.0–46.0)
Hemoglobin: 8.8 g/dL — ABNORMAL LOW (ref 12.0–15.0)
LYMPHS PCT: 7 % — AB (ref 12–46)
Lymphs Abs: 1.3 10*3/uL (ref 0.7–4.0)
MCH: 20.7 pg — AB (ref 26.0–34.0)
MCHC: 33.5 g/dL (ref 30.0–36.0)
MCV: 61.7 fL — ABNORMAL LOW (ref 78.0–100.0)
MONO ABS: 1.7 10*3/uL — AB (ref 0.1–1.0)
Monocytes Relative: 9 % (ref 3–12)
NEUTROS PCT: 82 % — AB (ref 43–77)
Neutro Abs: 15.7 10*3/uL — ABNORMAL HIGH (ref 1.7–7.7)
Platelets: 421 10*3/uL — ABNORMAL HIGH (ref 150–400)
RBC: 4.26 MIL/uL (ref 3.87–5.11)
RDW: 14.9 % (ref 11.5–15.5)
WBC: 19.1 10*3/uL — ABNORMAL HIGH (ref 4.0–10.5)

## 2014-02-01 LAB — COMPREHENSIVE METABOLIC PANEL
ALK PHOS: 86 U/L (ref 39–117)
ALT: 9 U/L (ref 0–35)
AST: 24 U/L (ref 0–37)
Albumin: 2.3 g/dL — ABNORMAL LOW (ref 3.5–5.2)
BILIRUBIN TOTAL: 0.3 mg/dL (ref 0.3–1.2)
BUN: 10 mg/dL (ref 6–23)
CHLORIDE: 104 meq/L (ref 96–112)
CO2: 25 meq/L (ref 19–32)
Calcium: 8 mg/dL — ABNORMAL LOW (ref 8.4–10.5)
Creatinine, Ser: 0.86 mg/dL (ref 0.50–1.10)
GFR calc non Af Amer: 63 mL/min — ABNORMAL LOW (ref >90)
GFR, EST AFRICAN AMERICAN: 73 mL/min — AB (ref >90)
GLUCOSE: 100 mg/dL — AB (ref 70–99)
Potassium: 3.1 mEq/L — ABNORMAL LOW (ref 3.7–5.3)
Sodium: 142 mEq/L (ref 137–147)
Total Protein: 5.7 g/dL — ABNORMAL LOW (ref 6.0–8.3)

## 2014-02-01 LAB — CULTURE, BLOOD (SINGLE)

## 2014-02-01 LAB — BODY FLUID CULTURE: Culture: NO GROWTH

## 2014-02-01 MED ORDER — DEXTROSE 5 % IV SOLN
1.0000 g | Freq: Three times a day (TID) | INTRAVENOUS | Status: DC
  Administered 2014-02-01 – 2014-02-02 (×3): 1 g via INTRAVENOUS
  Filled 2014-02-01 (×4): qty 1

## 2014-02-01 NOTE — Progress Notes (Signed)
Subjective: Pt feeling a little better; has been OOB; still with intermittent rt abd/rt buttocks discomfort; denies N/V; NG tube in place  Objective: Vital signs in last 24 hours: Temp:  [98.9 F (37.2 C)-99.2 F (37.3 C)] 98.9 F (37.2 C) (04/18 1325) Pulse Rate:  [96-112] 103 (04/18 1325) Resp:  [20-22] 20 (04/18 1325) BP: (125-178)/(49-70) 138/56 mmHg (04/18 1325) SpO2:  [91 %-100 %] 96 % (04/18 1325) Last BM Date: 01/31/14  Intake/Output from previous day: 04/17 0701 - 04/18 0700 In: 1690 [I.V.:590; NG/GT:100; IV Piggyback:850] Out: 1150 [Urine:500; Emesis/NG output:200; Blood:450] Intake/Output this shift: Total I/O In: -  Out: 300 [Urine:300]  Rt TG drain intact, insertion site ok, output minimal (last recorded 25 cc's serous fluid); cx's pend; creat level only 1(repeat level pend); cyt - neg for malig  Lab Results:   Recent Labs  01/31/14 0106 02/01/14 0420  WBC 24.0* 19.1*  HGB 8.9* 8.8*  HCT 27.9* 26.3*  PLT 399 421*   BMET  Recent Labs  01/31/14 0106 02/01/14 0420  NA 137 142  K 3.4* 3.1*  CL 102 104  CO2 23 25  GLUCOSE 106* 100*  BUN 11 10  CREATININE 0.93 0.86  CALCIUM 8.2* 8.0*   PT/INR  Recent Labs  01/30/14 0317  LABPROT 16.0*  INR 1.31   ABG No results found for this basename: PHART, PCO2, PO2, HCO3,  in the last 72 hours  Studies/Results: Ct Pelvis Wo Contrast  01/30/2014   CLINICAL DATA:  Pelvic abscess of unclear etiology. Rectal contrast given to evaluate for leak/fistula.  EXAM: CT PELVIS WITHOUT CONTRAST  TECHNIQUE: Multidetector CT imaging of the pelvis was performed following the standard protocol without intravenous contrast. Rectal contrast was administered via rectal catheter. Patient had difficulty keeping balloon catheter in the rectum.  COMPARISON:  01/28/2014  FINDINGS: Foley catheter is present within a collapsed bladder. Rectal catheter is in place with contrast filling the rectosigmoid colon and remainder of the  visualized colon. There is no evidence of contrast extravasation/leak. B fluid collection just lateral and inferior to the sigmoid colon in the left pelvis is unchanged to slightly larger measuring 3 x 5.2 cm with less air within the fluid collection. There is a right gluteal pigtail drainage catheter with pigtail over the right pelvis as there has been resolution of the deep pelvic fluid collections in the region of the vaginal cuff and right perirectal region. There is a continued fluid collection with a few small air-fluid levels over the right upper pelvis without significant change as this measures 4.3 x 4.9 cm. The pigtail drainage catheter is immediately posterior and slightly inferior to this persistent fluid collection. No evidence of free air. There are persistent air-filled mildly dilated small bowel loops likely secondary ileus to this inflammatory/infectious process. There is moderate diverticulosis of the colon. Remainder the exam is unchanged.  IMPRESSION: Right gluteal pigtail drainage catheter in place with pigtail just right of midline and interval resolution of the fluid collections over the right perirectal region and vaginal cuff. Persistent fluid collection over the left pelvis inferior lateral to the adjacent sigmoid colon with slight increase in size measuring 3.1 x 5.2 cm. Stable fluid collection with a few small air-fluid levels over the right upper pelvis. The adjacent rectosigmoid colon is well opacified with contrast as there is no evidence of contrast extravasation/ leak or fistula.  Continued persistent dilated fluid and air-filled small bowel loops likely secondary ileus to the pelvic infectious/inflammatory process.  Diverticulosis of the  colon.   Electronically Signed   By: Marin Olp M.D.   On: 01/30/2014 17:11   Dg Cystogram  01/30/2014   CLINICAL DATA:  Pelvic abscesses.  EXAM: CYSTOGRAM  TECHNIQUE: After catheterization of the urinary bladder following sterile technique  the bladder was filled with 300 mL Cysto-Hypaque 30% by drip infusion. Serial spot images were obtained during bladder filling and post draining.  FLUOROSCOPY TIME:  2 min 9 seconds slow pulsed fluoroscopy adjusted for body habitus  COMPARISON:  CT scans dated 04/14 and 01/30/2014  FINDINGS: Contrast was instilled through the indwelling Foley catheter. Contrast is seen in the colon from the previous CT scan. I placed the patient in the lateral projection and filled the bladder. Multiple radiographs were then obtained in multiple projections which demonstrate no evidence of extravasation of contrast. No visible fistula to the bowel.  Abscess drainage catheter is noted. Post drainage radiograph demonstrates no contrast in the abscess drainage catheter.  IMPRESSION: Normal cystogram.   Electronically Signed   By: Rozetta Nunnery M.D.   On: 01/30/2014 17:45    Anti-infectives: Anti-infectives   Start     Dose/Rate Route Frequency Ordered Stop   02/01/14 1800  ceFEPIme (MAXIPIME) 1 g in dextrose 5 % 50 mL IVPB     1 g 100 mL/hr over 30 Minutes Intravenous Every 8 hours 02/01/14 1258     01/31/14 1000  vancomycin (VANCOCIN) IVPB 1000 mg/200 mL premix     1,000 mg 200 mL/hr over 60 Minutes Intravenous Every 12 hours 01/31/14 0228     01/31/14 0600  ceFEPIme (MAXIPIME) 2 g in dextrose 5 % 50 mL IVPB  Status:  Discontinued     2 g 100 mL/hr over 30 Minutes Intravenous Every 24 hours 01/30/14 1059 02/01/14 1258   01/28/14 1800  metroNIDAZOLE (FLAGYL) IVPB 500 mg     500 mg 100 mL/hr over 60 Minutes Intravenous Every 8 hours 01/28/14 1725     01/28/14 1200  vancomycin (VANCOCIN) IVPB 750 mg/150 ml premix  Status:  Discontinued     750 mg 150 mL/hr over 60 Minutes Intravenous Every 12 hours 01/27/14 2158 01/31/14 0228   01/28/14 0200  vancomycin (VANCOCIN) IVPB 750 mg/150 ml premix     750 mg 150 mL/hr over 60 Minutes Intravenous  Once 01/27/14 2158 01/28/14 0336   01/27/14 2200  ceFEPIme (MAXIPIME) 1 g in  dextrose 5 % 50 mL IVPB  Status:  Discontinued     1 g 100 mL/hr over 30 Minutes Intravenous 3 times per day 01/27/14 2127 01/30/14 1058      Assessment/Plan: s/p transgluteal (R)pelvic fluic collection drainage 4/15.   f/u creat level on fluid pend; cystogram nl WBC down some today to 19.1 Repeat CT reviewed with Dr. Vernard Gambles. Residual/persistent fluid collections are not amenable to drainage due to adjacent bowel.  Recommend continue present plan for a few more days, see if collections will decompress via current drain. Repeat CT early next week to reassess    LOS: 5 days    D Rowe Robert 02/01/2014

## 2014-02-01 NOTE — Progress Notes (Signed)
Patient seen and examined Friday morning No significant abdominal pain. Son reports that she had a small bowel movement  Alert, no apparent distress Obese, soft, not really tender  Drains-serous  Her CT cystogram showed no evidence of extravasation from her bladder, her CT of her pelvis with rectal contrast showed numerous diverticula which were unruptured and no evidence of diverticulitis. There is a focal loop of small bowel in the pelvis that had some wall thickening which is unchanged from previous scan earlier in the week. Residual small rim-enhancing fluid collection  I initially thought the small pelvic abscesses were triggering the thickened small bowel loop; However since there is no signs of perforated colon or bladder and the fluid collection appears to be a sterile fluid collection-my best guess is the inciting problem may be a thickened small bowel loop which caused physiologic pelvic fluid To wall off. There is really nothing surgically to operate on right now. Recommend GI consult for input regarding small bowel wall thickening-questionable possible enteritis. Believe this thickened small bowel loop is causing ileus. Right now will recommend treating her like ileus.. Followup drain cultures. Discussed with hospitalist  Leighton Ruff. Redmond Pulling, MD, FACS General, Bariatric, & Minimally Invasive Surgery [facility], [place]

## 2014-02-01 NOTE — Progress Notes (Signed)
Note: This document was prepared with digital dictation and possible smart phrase technology. Any transcriptional errors that result from this process are unintentional.   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  Started on Zpac by PCP, got worse  and developed toxic metabolic encephalopathy and was transferred to [facility]. She underwent CT scan abdomen/pelvis 4/14 showing 3 areas of potential abscesses, ?Vaginal dehiscence, ?P SBO and Gen surgery/IR consulted. Perc drain placed by IR 4/15 yielding clear fluid with ng as yet.  Given she seemed a little more distended, NG tube placed 4/16 with ~750 cc out bilious material Rpt Ct performed 4/16 showed a persistent fluid collection over over sigmoid colon 3.1 x 5.2 without extravasation suggestive of a leak +m ileus and Cystogram showed no leak of contrast IR was re-consulted to evalaute fluid collection which was not possible.  Because of persisting symptoms with white count and abd pain GI was consulted who thought this could have been a perforated and then healed diverticulum and recommended Urology being made aware.   She has done fairly well over past 2 days with NGT and put out about 750 cc 4/16-4/17 from NGT    Past medical history-As per Problem list Chart reviewed as below-   Consultants:  Gen surgery  IR   Procedures:  CT scan ab  PERC drain  4/15  Antibiotics:  Vanc 4/13  Cefepime 4/13  Flagyl 4/13 l  Subjective   Slightly confused.  Doing fair.  Abdomen much less tender.  Doesn;t want to wean pan meds No dark stool.  Small smear noted this am when coughing.   Passing some gas.  No CP or SOB     Objective    Interim History: Nursing reports some confusion  Telemetry: Sinus tach 120's   Objective: Filed Vitals:   01/31/14 2000 02/01/14 0528 02/01/14 0530 02/01/14 0831  BP: 158/58 153/58    Pulse: 104 104    Temp: 99.1 F (37.3 C) 99.2 F (37.3 C)    TempSrc: Oral Oral    Resp: 22 20    Height:      Weight:      SpO2: 100% 98% 97% 91%    Intake/Output Summary (Last 24 hours) at 02/01/14 1022 Last data filed at 02/01/14 0700  Gross per 24 hour  Intake   1340 ml  Output   1150 ml  Net    190 ml    Exam:  General:  EOMI, NCAT Cardiovascular: s1 s2 no m/r/g-tachy Respiratory: clear, no added sound Abdomen:  Soft, less distended than prior with no rebound/guarding Skin no Le edema Neuro intact   Data Reviewed: Basic Metabolic Panel:  Recent Labs Lab 01/28/14 0324 01/29/14 0305 01/30/14 0317 01/31/14 0106 02/01/14 0420  NA 138 138 138 137 142  K 3.6* 3.3* 3.5* 3.4* 3.1*  CL 103 103 100 102 104  CO2 23 22 22 23 25   GLUCOSE 145* 127* 116* 106* 100*  BUN 13 10 10 11 10   CREATININE 0.94 0.96 1.06 0.93 0.86  CALCIUM 7.8* 7.5* 8.2* 8.2* 8.0*   Liver Function Tests:  Recent Labs Lab 01/30/14 0317 01/31/14 0106 02/01/14 0420  AST 19  14 24  ALT 10 9 9   ALKPHOS 128* 92 86  BILITOT 0.5 0.4 0.3  PROT 6.4 5.8* 5.7*  ALBUMIN 2.6* 2.3* 2.3*   No results found for this basename: LIPASE, AMYLASE,  in the last 168 hours No results found for this basename: AMMONIA,  in the last 168 hours CBC:  Recent Labs Lab 01/28/14 0324 01/29/14 0305 01/30/14 0317 01/31/14 0106 02/01/14 0420  WBC 16.3* 28.1* 32.5* 24.0* 19.1*  NEUTROABS  --   --   --  20.7* 15.7*  HGB 9.6* 9.6* 10.3* 8.9* 8.8*  HCT 29.5* 29.0* 32.3* 27.9* 26.3*  MCV 63.4* 64.3* 63.8* 63.6* 61.7*  PLT 355 356 426* 399 421*   Cardiac Enzymes: No results found for this basename: CKTOTAL, CKMB, CKMBINDEX, TROPONINI,  in the last 168 hours BNP: No  components found with this basename: POCBNP,  CBG: No results found for this basename: GLUCAP,  in the last 168 hours  Recent Results (from the past 240 hour(s))  MRSA PCR SCREENING     Status: None   Collection Time    01/27/14  8:56 PM      Result Value Ref Range Status   MRSA by PCR NEGATIVE  NEGATIVE Final   Comment:            The GeneXpert MRSA Assay (FDA     approved for NASAL specimens     only), is one component of a     comprehensive MRSA colonization     surveillance program. It is not     intended to diagnose MRSA     infection nor to guide or     monitor treatment for     MRSA infections.  CULTURE, BLOOD (ROUTINE X 2)     Status: None   Collection Time    01/27/14 10:39 PM      Result Value Ref Range Status   Specimen Description BLOOD BLOOD RIGHT FOREARM   Final   Special Requests BOTTLES DRAWN AEROBIC ONLY 8CC   Final   Culture  Setup Time     Final   Value: 01/28/2014 03:56     Performed at [facility]   Culture     Final   Value:        BLOOD CULTURE RECEIVED NO GROWTH TO DATE CULTURE WILL BE HELD FOR 5 DAYS BEFORE ISSUING A FINAL NEGATIVE REPORT     Performed at [facility]   Report Status PENDING   Incomplete  CULTURE, BLOOD (ROUTINE X 2)     Status: None   Collection Time    01/27/14 10:47 PM      Result Value Ref Range Status   Specimen Description BLOOD LEFT ANTECUBITAL   Final   Special Requests BOTTLES DRAWN AEROBIC AND ANAEROBIC 10CC   Final   Culture  Setup Time     Final   Value: 01/28/2014 03:56     Performed at [facility]   Culture     Final   Value:        BLOOD CULTURE RECEIVED NO GROWTH TO DATE CULTURE WILL BE HELD FOR 5 DAYS BEFORE ISSUING A FINAL NEGATIVE REPORT     Performed at [facility]   Report Status PENDING   Incomplete  URINE CULTURE     Status: None   Collection Time    01/27/14 10:53 PM      Result Value Ref Range Status   Specimen Description URINE, CLEAN CATCH  Final   Special  Requests NONE   Final   Culture  Setup Time     Final   Value: 01/28/2014 05:28     Performed at [facility] Count     Final   Value: NO GROWTH     Performed at [facility]   Culture     Final   Value: NO GROWTH     Performed at [facility]   Report Status 01/29/2014 FINAL   Final  CLOSTRIDIUM DIFFICILE BY PCR     Status: None   Collection Time    01/28/14  4:36 AM      Result Value Ref Range Status   C difficile by pcr NEGATIVE  NEGATIVE Final   Comment: Performed at [facility]  BODY FLUID CULTURE     Status: None   Collection Time    01/29/14 12:43 PM      Result Value Ref Range Status   Specimen Description FLUID PELVIC   Final   Special Requests NONE   Final   Gram Stain     Final   Value: FEW WBC PRESENT,BOTH PMN AND MONONUCLEAR     NO ORGANISMS SEEN     Performed at [facility]   Culture     Final   Value: NO GROWTH 2 DAYS     Performed at [facility]   Report Status PENDING   Incomplete     Studies:              All Imaging reviewed and is as per above notation   Scheduled Meds: . albuterol  2.5 mg Nebulization Q6H  . ceFEPime (MAXIPIME) IV  2 g Intravenous Q24H  . enoxaparin (LOVENOX) injection  40 mg Subcutaneous Q24H  . famotidine (PEPCID) IV  20 mg Intravenous Q12H  . levothyroxine  37.5 mcg Intravenous Daily  . metoprolol  2.5 mg Intravenous 4 times per day  . metronidazole  500 mg Intravenous Q8H  . vancomycin  1,000 mg Intravenous Q12H   Continuous Infusions: . sodium chloride 10 mL/hr at 01/29/14 2100  . sodium chloride 50 mL/hr at 01/30/14 1822     Assessment/Plan:  1. Sepsis-likely multifactorial 2/2 to multiple abcesses + hcap/uti  Urine culture from [place]=K Pneumonia-current antibiotics would cover this.  Abscess drain culture neg so far.  Unclear if this is a physiologic fluid collection [dehiscence of hysterectomy?]-could be multiple processes as has increase in WBC 20  ???32  ??? 24  ???19 on 4/18, no fever but low grade temp overnight.  Further drainage  Not possible per IR--??healed diverticular abcesses.  Continue broad spectrum Abx-Cefepime/Vancomycin/Zosyn-all other non-essential meds held for now.  Appreciate IR and Surgery continued Input-will narrowabx in am probably 2. SBO-continue NGT for now.  Monitor output and repeat 2 vw AXR in am-no obstruction 3. Sinus tachycardia-new finding this admission-likely related to #1.  Monitor.  Note not taking Home dose metoprolol XL 200 daily so could be rebound tachycardia.  Implement at lower dose once not NPO.  For now continue IV metoprolol 2.5-5 mg q6 hour 4. Htn-see above discussion.  Her HCTZ/Diovan, amlodipine is also on hold 5. Hypothyroid-TSH 7.37, T4 0.74 but would not adjust her medications currently.  As she is acutely ill.  Would repeat labs in 1 month or so when more clinically stable 6. H/o Thalassemia 7. Hyperglycemia-A1c 6.0.  Monitor sugars while SDU status-ranges 106-116 8. Cervical spondylosis s.p ACDF 04/2013 9.  Gerd-Continue pantoprazole 40 iv bid-changed to IV Famotidine given critically low supply of this 10. HRT-hold Premarin 0.45 daily for now.  Code Status: Full Family Communication: d/w son beddside 4/18 Disposition Plan:  Tele   Verneita Griffes, MD  [facility] Pager [phone] 02/01/2014, 10:22 AM    LOS: 5 days

## 2014-02-01 NOTE — Progress Notes (Signed)
ANTIBIOTIC CONSULT NOTE - FOLLOW UP  Pharmacy Consult for Vancomycin, Antibiotic Renal Dose Adjustment Indication: sepsis  Allergies  Allergen Reactions  . Hydrocodone     hallucinations  . Oxycodone     hallucinations  . Zoloft [Sertraline Hcl]     Burns stomach  . Ciprofloxacin Swelling  . Sulfa Antibiotics     Blisters in mouth  . Tylenol [Acetaminophen]     headaches  . Ultram [Tramadol]     headaches    Patient Measurements: Height: 5\' 2"  (157.5 cm) Weight: 188 lb 4.4 oz (85.4 kg) IBW/kg (Calculated) : 50.1  Vital Signs: Temp: 99.2 F (37.3 C) (04/18 0528) Temp src: Oral (04/18 0528) BP: 138/57 mmHg (04/18 1212) Pulse Rate: 112 (04/18 1212) Intake/Output from previous day: 04/17 0701 - 04/18 0700 In: 1690 [I.V.:590; NG/GT:100; IV Piggyback:850] Out: 1150 [Urine:500; Emesis/NG output:200; Blood:450] Intake/Output from this shift:    Labs:  Recent Labs  01/30/14 0317 01/31/14 0106 02/01/14 0420  WBC 32.5* 24.0* 19.1*  HGB 10.3* 8.9* 8.8*  PLT 426* 399 421*  CREATININE 1.06 0.93 0.86   Estimated Creatinine Clearance: 53.8 ml/min (by C-G formula based on Cr of 0.86).  Recent Labs  01/31/14 0107  Louise 14.4     Microbiology: 4/13 blood: NGTD 4/13 urine: NGF 4/13 sputum: ordered 4/13 S. pneumo Ag: negative 4/13 Legionella Ag: negative 4/13 Flu panel: sent 4/13 Flu Ag: sent  4/13 MRSA PCR - neg 4/14 Cdiff PCR: negative 4/15 Pelvic fluid-pending, no org. seen  Assessment: 26 yoF admitted with sepsis 2/2 intra-abdominal infection (multiple abscess), HCAP, UTI.  Day #5 Vancomycin 1g IV q12h, Cefepime 2g IV q24h, and day #4 Flagyl 500 mg IV q8h.  Afebrile  WBC 19.1  Renal: SCr 0.86 (improved), CrCl~59 ml/min (normalized), ~70 ml/min (CG)  Nothing growing on cultures thus far.  Goal of Therapy:  Vancomycin trough level 15-20 mcg/ml Eradication of infection  Plan:  1.  Due to improved renal clearance, will adjust Cefepime to 1g  IV q8h. 2.  Continue Vancomycin at current dose and will obtain a trough level tomorrow if continued. 3.  F/u narrowing of abx and LOT.  Donald Prose Rocket Gunderson 02/01/2014,12:51 PM

## 2014-02-01 NOTE — Consult Note (Signed)
Reason for Consult:Pelvic Abscess, h/o UTI  Referring Physician: Trena Platt MD  Laura Frey is an 80 y.o. female.   HPI:   1- Pelvic Fluid Collections - Admitted 4/13 in transfer form [place] for possible pelvic abscesses found by CT on eval abd pain, luekocytosis, and bowel osbtruction. No recent GU intrumentation. CT and cystogram 4/15 w/o bladder leak / fistula. Gram stain and CX sterile from IR placed drain. Leukocytosis improving and GI contrast now to colon by serial imaging. Initial fluid Cr same as serum at 1.0. She has h/o vaginal hysterectomy with cystotomy and immediate repair in 1978.   2 - Recurrent Cystitis - Follows with MacDiarmid for recurrent Cystitis, on premarin proph only. UCX 4/15, this admission, sterile.  3 - Atrohic Vaginitis - On premarin topical twice weekly for senile atrophic vaginitis to reduce dysuria and help prevent UTI / vaginal acidification.   4 - Urge Urinary Incontinence - On Vesicare 42m daily at baseline for urodynamically provedn urge incontincne, follows with MacDiarmid as outpatient.   Past Medical History  Diagnosis Date  . Hypertension   . Hypothyroidism   . Depression   . Shortness of breath   . Asthma   . Peripheral vascular disease     legs  . GERD (gastroesophageal reflux disease)   . Headache(784.0)   . Cancer     melanoma on back  . Anemia     hx Thalassemia minor anemia  . Tendinitis of both rotator cuffs   . Chronic back pain     ESI/uses BC powders    Past Surgical History  Procedure Laterality Date  . Tubal ligation    . Appendectomy    . Tonsillectomy    . Eye surgery Bilateral     cataracts  . Abdominal hysterectomy      vagina  . Bladder repair      after hysterectomy same day  . Anterior cervical decomp/discectomy fusion N/A 05/01/2013    Procedure: ANTERIOR CERVICAL DECOMPRESSION/DISCECTOMY FUSION 3 LEVELS;  Surgeon: GElaina Hoops MD;  [facility];  Service: Neurosurgery;  Laterality: N/A;   ANTERIOR CERVICAL DECOMPRESSION/DISCECTOMY FUSION 3 LEVELS    Family History  Problem Relation Age of Onset  . Thalassemia Son   . Thalassemia Paternal Aunt     Social History:  reports that she quit smoking about 18 years ago. Her smoking use included Cigarettes. She has a 30 pack-year smoking history. She has never used smokeless tobacco. She reports that she does not drink alcohol or use illicit drugs.  Allergies:  Allergies  Allergen Reactions  . Hydrocodone     hallucinations  . Oxycodone     hallucinations  . Zoloft [Sertraline Hcl]     Burns stomach  . Ciprofloxacin Swelling  . Sulfa Antibiotics     Blisters in mouth  . Tylenol [Acetaminophen]     headaches  . Ultram [Tramadol]     headaches    Medications: I have reviewed the patient's current medications.  Results for orders placed during the hospital encounter of 01/27/14 (from the past 48 hour(s))  PROCALCITONIN     Status: None   Collection Time    01/31/14  1:06 AM      Result Value Ref Range   Procalcitonin 1.23     Comment:            Interpretation:     PCT > 0.5 ng/mL and <= 2 ng/mL:     Systemic infection (sepsis)  is possible,     but other conditions are known to elevate     PCT as well.     (NOTE)             ICU PCT Algorithm               Non ICU PCT Algorithm        ----------------------------     ------------------------------             PCT < 0.25 ng/mL                 PCT < 0.1 ng/mL         Stopping of antibiotics            Stopping of antibiotics           strongly encouraged.               strongly encouraged.        ----------------------------     ------------------------------           PCT level decrease by               PCT < 0.25 ng/mL           >= 80% from peak PCT           OR PCT 0.25 - 0.5 ng/mL          Stopping of antibiotics                                                 encouraged.         Stopping of antibiotics               encouraged.         ----------------------------     ------------------------------           PCT level decrease by              PCT >= 0.25 ng/mL           < 80% from peak PCT            AND PCT >= 0.5 ng/mL            Continuing antibiotics                                                  encouraged.           Continuing antibiotics                encouraged.        ----------------------------     ------------------------------         PCT level increase compared          PCT > 0.5 ng/mL             with peak PCT AND              PCT >= 0.5 ng/mL             Escalation of antibiotics  strongly encouraged.          Escalation of antibiotics            strongly encouraged.  CBC WITH DIFFERENTIAL     Status: Abnormal   Collection Time    01/31/14  1:06 AM      Result Value Ref Range   WBC 24.0 (*) 4.0 - 10.5 K/uL   RBC 4.39  3.87 - 5.11 MIL/uL   Hemoglobin 8.9 (*) 12.0 - 15.0 g/dL   HCT 27.9 (*) 36.0 - 46.0 %   MCV 63.6 (*) 78.0 - 100.0 fL   MCH 20.3 (*) 26.0 - 34.0 pg   MCHC 31.9  30.0 - 36.0 g/dL   RDW 15.3  11.5 - 15.5 %   Platelets 399  150 - 400 K/uL   Neutrophils Relative % 86 (*) 43 - 77 %   Lymphocytes Relative 7 (*) 12 - 46 %   Monocytes Relative 6  3 - 12 %   Eosinophils Relative 1  0 - 5 %   Basophils Relative 0  0 - 1 %   Neutro Abs 20.7 (*) 1.7 - 7.7 K/uL   Lymphs Abs 1.7  0.7 - 4.0 K/uL   Monocytes Absolute 1.4 (*) 0.1 - 1.0 K/uL   Eosinophils Absolute 0.2  0.0 - 0.7 K/uL   Basophils Absolute 0.0  0.0 - 0.1 K/uL   RBC Morphology POLYCHROMASIA PRESENT     Comment: ELLIPTOCYTES  COMPREHENSIVE METABOLIC PANEL     Status: Abnormal   Collection Time    01/31/14  1:06 AM      Result Value Ref Range   Sodium 137  137 - 147 mEq/L   Potassium 3.4 (*) 3.7 - 5.3 mEq/L   Chloride 102  96 - 112 mEq/L   CO2 23  19 - 32 mEq/L   Glucose, Bld 106 (*) 70 - 99 mg/dL   BUN 11  6 - 23 mg/dL   Creatinine, Ser 0.93  0.50 - 1.10 mg/dL   Calcium 8.2 (*)  8.4 - 10.5 mg/dL   Total Protein 5.8 (*) 6.0 - 8.3 g/dL   Albumin 2.3 (*) 3.5 - 5.2 g/dL   AST 14  0 - 37 U/L   ALT 9  0 - 35 U/L   Alkaline Phosphatase 92  39 - 117 U/L   Total Bilirubin 0.4  0.3 - 1.2 mg/dL   GFR calc non Af Amer 57 (*) >90 mL/min   GFR calc Af Amer 66 (*) >90 mL/min   Comment: (NOTE)     The eGFR has been calculated using the CKD EPI equation.     This calculation has not been validated in all clinical situations.     eGFR's persistently <90 mL/min signify possible Chronic Kidney     Disease.  VANCOMYCIN, TROUGH     Status: None   Collection Time    01/31/14  1:07 AM      Result Value Ref Range   Vancomycin Tr 14.4  10.0 - 20.0 ug/mL  CBC WITH DIFFERENTIAL     Status: Abnormal   Collection Time    02/01/14  4:20 AM      Result Value Ref Range   WBC 19.1 (*) 4.0 - 10.5 K/uL   RBC 4.26  3.87 - 5.11 MIL/uL   Hemoglobin 8.8 (*) 12.0 - 15.0 g/dL   HCT 26.3 (*) 36.0 - 46.0 %   MCV 61.7 (*) 78.0 - 100.0 fL   MCH  20.7 (*) 26.0 - 34.0 pg   MCHC 33.5  30.0 - 36.0 g/dL   RDW 14.9  11.5 - 15.5 %   Platelets 421 (*) 150 - 400 K/uL   Neutrophils Relative % 82 (*) 43 - 77 %   Lymphocytes Relative 7 (*) 12 - 46 %   Monocytes Relative 9  3 - 12 %   Eosinophils Relative 2  0 - 5 %   Basophils Relative 0  0 - 1 %   Neutro Abs 15.7 (*) 1.7 - 7.7 K/uL   Lymphs Abs 1.3  0.7 - 4.0 K/uL   Monocytes Absolute 1.7 (*) 0.1 - 1.0 K/uL   Eosinophils Absolute 0.4  0.0 - 0.7 K/uL   Basophils Absolute 0.0  0.0 - 0.1 K/uL   RBC Morphology POLYCHROMASIA PRESENT     Comment: TARGET CELLS     TEARDROP CELLS     OVALOCYTES  COMPREHENSIVE METABOLIC PANEL     Status: Abnormal   Collection Time    02/01/14  4:20 AM      Result Value Ref Range   Sodium 142  137 - 147 mEq/L   Potassium 3.1 (*) 3.7 - 5.3 mEq/L   Chloride 104  96 - 112 mEq/L   CO2 25  19 - 32 mEq/L   Glucose, Bld 100 (*) 70 - 99 mg/dL   BUN 10  6 - 23 mg/dL   Creatinine, Ser 0.86  0.50 - 1.10 mg/dL   Calcium 8.0 (*)  8.4 - 10.5 mg/dL   Total Protein 5.7 (*) 6.0 - 8.3 g/dL   Albumin 2.3 (*) 3.5 - 5.2 g/dL   AST 24  0 - 37 U/L   ALT 9  0 - 35 U/L   Alkaline Phosphatase 86  39 - 117 U/L   Total Bilirubin 0.3  0.3 - 1.2 mg/dL   GFR calc non Af Amer 63 (*) >90 mL/min   GFR calc Af Amer 73 (*) >90 mL/min   Comment: (NOTE)     The eGFR has been calculated using the CKD EPI equation.     This calculation has not been validated in all clinical situations.     eGFR's persistently <90 mL/min signify possible Chronic Kidney     Disease.    Ct Pelvis Wo Contrast  01/30/2014   CLINICAL DATA:  Pelvic abscess of unclear etiology. Rectal contrast given to evaluate for leak/fistula.  EXAM: CT PELVIS WITHOUT CONTRAST  TECHNIQUE: Multidetector CT imaging of the pelvis was performed following the standard protocol without intravenous contrast. Rectal contrast was administered via rectal catheter. Patient had difficulty keeping balloon catheter in the rectum.  COMPARISON:  01/28/2014  FINDINGS: Foley catheter is present within a collapsed bladder. Rectal catheter is in place with contrast filling the rectosigmoid colon and remainder of the visualized colon. There is no evidence of contrast extravasation/leak. B fluid collection just lateral and inferior to the sigmoid colon in the left pelvis is unchanged to slightly larger measuring 3 x 5.2 cm with less air within the fluid collection. There is a right gluteal pigtail drainage catheter with pigtail over the right pelvis as there has been resolution of the deep pelvic fluid collections in the region of the vaginal cuff and right perirectal region. There is a continued fluid collection with a few small air-fluid levels over the right upper pelvis without significant change as this measures 4.3 x 4.9 cm. The pigtail drainage catheter is immediately posterior and slightly inferior to this  persistent fluid collection. No evidence of free air. There are persistent air-filled mildly  dilated small bowel loops likely secondary ileus to this inflammatory/infectious process. There is moderate diverticulosis of the colon. Remainder the exam is unchanged.  IMPRESSION: Right gluteal pigtail drainage catheter in place with pigtail just right of midline and interval resolution of the fluid collections over the right perirectal region and vaginal cuff. Persistent fluid collection over the left pelvis inferior lateral to the adjacent sigmoid colon with slight increase in size measuring 3.1 x 5.2 cm. Stable fluid collection with a few small air-fluid levels over the right upper pelvis. The adjacent rectosigmoid colon is well opacified with contrast as there is no evidence of contrast extravasation/ leak or fistula.  Continued persistent dilated fluid and air-filled small bowel loops likely secondary ileus to the pelvic infectious/inflammatory process.  Diverticulosis of the colon.   Electronically Signed   By: Marin Olp M.D.   On: 01/30/2014 17:11   Dg Cystogram  01/30/2014   CLINICAL DATA:  Pelvic abscesses.  EXAM: CYSTOGRAM  TECHNIQUE: After catheterization of the urinary bladder following sterile technique the bladder was filled with 300 mL Cysto-Hypaque 30% by drip infusion. Serial spot images were obtained during bladder filling and post draining.  FLUOROSCOPY TIME:  2 min 9 seconds slow pulsed fluoroscopy adjusted for body habitus  COMPARISON:  CT scans dated 04/14 and 01/30/2014  FINDINGS: Contrast was instilled through the indwelling Foley catheter. Contrast is seen in the colon from the previous CT scan. I placed the patient in the lateral projection and filled the bladder. Multiple radiographs were then obtained in multiple projections which demonstrate no evidence of extravasation of contrast. No visible fistula to the bowel.  Abscess drainage catheter is noted. Post drainage radiograph demonstrates no contrast in the abscess drainage catheter.  IMPRESSION: Normal cystogram.    Electronically Signed   By: Rozetta Nunnery M.D.   On: 01/30/2014 17:45   Dg Abd Acute W/chest  01/30/2014   CLINICAL DATA:  Shortness of breath.  Diffuse abdominal pain.  EXAM: ACUTE ABDOMEN SERIES (ABDOMEN 2 VIEW & CHEST 1 VIEW)  COMPARISON:  Chest x-ray dated 01/29/2014 and CT scan dated 01/28/2014  FINDINGS: Right middle lobe atelectasis has improved. There is still slight pulmonary vascular congestion. Slight linear atelectasis at the left lung base. Tiny bilateral effusions.  No free air in the abdomen. Proximal small bowel remains dilated. Contrast has passed into the nondistended colon. Abscess drainage catheter present in the right side of the pelvis.  IMPRESSION: Persistent pattern of partial small bowel obstruction. Contrast has moved into the nondistended colon.   Electronically Signed   By: Rozetta Nunnery M.D.   On: 01/30/2014 13:30    Review of Systems  Constitutional: Positive for malaise/fatigue. Negative for fever and chills.  HENT: Negative.   Eyes: Negative.   Respiratory: Negative.   Cardiovascular: Negative.   Gastrointestinal: Positive for nausea.  Genitourinary: Negative.  Negative for hematuria.  Skin: Negative.   Neurological: Negative.   Endo/Heme/Allergies: Negative.   Psychiatric/Behavioral: Negative.    Blood pressure 153/58, pulse 104, temperature 99.2 F (37.3 C), temperature source Oral, resp. rate 20, height $RemoveBe'5\' 2"'[id number]$  (1.575 m), weight 85.4 kg (188 lb 4.4 oz), SpO2 91.00%. Physical Exam  Constitutional: She is oriented to person, place, and time. She appears well-developed.  Ill appearing with NGT in place with bilious output  HENT:  Head: Normocephalic and atraumatic.  Eyes: Pupils are equal, round, and reactive to light.  Neck: Normal  range of motion. Neck supple.  Cardiovascular: Normal rate.   Respiratory: Effort normal.  GI: Soft.  Genitourinary:  Pelvic exam performed with RN chaparone. No palpable vaginal defects, well estrogenized shortened vagina c/w  prior hysterectomy. NO pooling vaginal fluid or foul discharge.  Musculoskeletal: Normal range of motion.  JP drain exiting Rt buttocks with straw colored fluid.  Neurological: She is alert and oriented to person, place, and time.  Skin: Skin is warm and dry.  Psychiatric: She has a normal mood and affect. Her behavior is normal. Judgment and thought content normal.    Assessment/Plan:   1- Pelvic Fluid Collections - Unclear etiology. DDX perofrated / sealed off prior diverticulitis v. spontneous formation. No evidence of GU fistula with very good quality cystogram this admission. I do not feel primary urolgoic intervention is waranted and agree with conservative measures with emperic ABX and maximal drainage as she is improving clinically. Given location of collections in area between sigmoid / vaginal cuff / bladder dome, should general surgery exploration be warranted we are certainly happy to assist / be available should any GU recontruction be necessary, but I feel this if of low liklihood. Will recheck JP Cr today as fluid from this drain is straw-colored to double verify not uriniferous. If not, will likely rec DC foley soon.  2 - Recurrent Cystitis - UCX sterile this admission. Continue outpatient regimen.   3 - Atrohic Vaginitis - Continue premarin for this and primary prevention of UTI. This will also keep vaginal tissue more robust / decrease chances of any cuff issues.   4 - Urge Urinary Incontinence - Ceratinly reasonable to hold anticholergics in setting of likely funcitonal bowel obstruciton.  5 - I will make Dr. Matilde Sprang aware of pt's admission on Monday. Please call anytime with questions in interval.   Alexis Frock 02/01/2014, 9:08 AM

## 2014-02-01 NOTE — Progress Notes (Signed)
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 Started on Zpac by PCP, got worse and developed toxic metabolic encephalopathy and was transferred to [facility].  She underwent CT scan abdomen/pelvis 4/14 showing 3 areas of potential abscesses, ?Vaginal dehiscence, ?P SBO and Gen surgery/IR consulted.  Perc drain placed by IR 4/15 yielding clear fluid with ng as yet.      PT Comments    Pt continues pleasant and cooperative but ltd by fatigue and nausea.  Follow Up Recommendations  Home health PT     Equipment Recommendations  None recommended by PT    Recommendations for Other Services OT consult     Precautions / Restrictions Precautions Precautions: Fall Restrictions Weight Bearing Restrictions: No    Mobility  Bed Mobility Overal bed mobility: +2 for physical assistance;Needs Assistance Bed Mobility: Supine to Sit     Supine to sit: Mod assist;+2 for physical assistance     General bed mobility comments: cues and assist for log roll technique and move to upright sitting  Transfers Overall transfer level: Needs assistance Equipment used: Rolling walker (2 wheeled) Transfers: Sit to/from Stand Sit to Stand: Mod assist;+2 physical assistance         General transfer comment: cues for transition position and use of UEs to self assist  Ambulation/Gait Ambulation/Gait assistance: +2 physical assistance;Min assist;Mod assist Ambulation Distance (Feet): 10 Feet Assistive device: Rolling walker (2 wheeled) Gait Pattern/deviations: Step-to pattern;Decreased step length -  right;Decreased step length - left;Shuffle;Trunk flexed Gait velocity: decreased with multiple rests 2* fatigue/nausea   General Gait Details: cues for posture, position from RW and sequence   Stairs            Wheelchair Mobility    Modified Rankin (Stroke Patients Only)       Balance                                    Cognition Arousal/Alertness: Awake/alert Behavior During Therapy: WFL for tasks assessed/performed Overall Cognitive Status: Within Functional Limits for tasks assessed                      Exercises      General Comments        Pertinent Vitals/Pain 6/10; Meds requested     Home Living                      Prior Function            PT Goals (current goals can now be found in the care plan section) Acute Rehab PT Goals Patient Stated Goal: HOME PT Goal Formulation: With patient Time For Goal Achievement: 02/14/14 Potential to Achieve Goals: Good Progress towards PT goals: Progressing toward goals    Frequency  Min 3X/week    PT Plan Current plan remains appropriate    Co-evaluation             End of Session   Activity Tolerance: Patient tolerated treatment well Patient left: in chair;with call bell/phone within reach  Time: 4481-8563 PT Time Calculation (min): 33 min  Charges:  $Gait Training: 8-22 mins $Therapeutic Activity: 8-22 mins                    G Codes:      Mathis Fare Mar 01, 2014, 12:53 PM

## 2014-02-01 NOTE — Progress Notes (Signed)
Subjective: She does not know how she feels.  Objective: Vital signs in last 24 hours: Temp:  [98 F (36.7 C)-99.2 F (37.3 C)] 99.2 F (37.3 C) (04/18 0528) Pulse Rate:  [96-107] 104 (04/18 0528) Resp:  [20-22] 20 (04/18 0528) BP: (125-178)/(49-72) 153/58 mmHg (04/18 0528) SpO2:  [91 %-100 %] 91 % (04/18 0831) Last BM Date: 01/31/14  Intake/Output from previous day: 04/17 0701 - 04/18 0700 In: 1690 [I.V.:590; NG/GT:100; IV Piggyback:850] Out: 1150 [Urine:500; Emesis/NG output:200; Blood:450] Intake/Output this shift:    General appearance: alert and no distress GI: distended, soft, tympanic  Lab Results:  Recent Labs  01/30/14 0317 01/31/14 0106 02/01/14 0420  WBC 32.5* 24.0* 19.1*  HGB 10.3* 8.9* 8.8*  HCT 32.3* 27.9* 26.3*  PLT 426* 399 421*   BMET  Recent Labs  01/30/14 0317 01/31/14 0106 02/01/14 0420  NA 138 137 142  K 3.5* 3.4* 3.1*  CL 100 102 104  CO2 22 23 25   GLUCOSE 116* 106* 100*  BUN 10 11 10   CREATININE 1.06 0.93 0.86  CALCIUM 8.2* 8.2* 8.0*   LFT  Recent Labs  02/01/14 0420  PROT 5.7*  ALBUMIN 2.3*  AST 24  ALT 9  ALKPHOS 86  BILITOT 0.3   PT/INR  Recent Labs  01/29/14 1246 01/30/14 0317  LABPROT 16.0* 16.0*  INR 1.31 1.31   Hepatitis Panel No results found for this basename: HEPBSAG, HCVAB, HEPAIGM, HEPBIGM,  in the last 72 hours C-Diff No results found for this basename: CDIFFTOX,  in the last 72 hours Fecal Lactopherrin No results found for this basename: FECLLACTOFRN,  in the last 72 hours  Studies/Results: Ct Pelvis Wo Contrast  01/30/2014   CLINICAL DATA:  Pelvic abscess of unclear etiology. Rectal contrast given to evaluate for leak/fistula.  EXAM: CT PELVIS WITHOUT CONTRAST  TECHNIQUE: Multidetector CT imaging of the pelvis was performed following the standard protocol without intravenous contrast. Rectal contrast was administered via rectal catheter. Patient had difficulty keeping balloon catheter in the  rectum.  COMPARISON:  01/28/2014  FINDINGS: Foley catheter is present within a collapsed bladder. Rectal catheter is in place with contrast filling the rectosigmoid colon and remainder of the visualized colon. There is no evidence of contrast extravasation/leak. B fluid collection just lateral and inferior to the sigmoid colon in the left pelvis is unchanged to slightly larger measuring 3 x 5.2 cm with less air within the fluid collection. There is a right gluteal pigtail drainage catheter with pigtail over the right pelvis as there has been resolution of the deep pelvic fluid collections in the region of the vaginal cuff and right perirectal region. There is a continued fluid collection with a few small air-fluid levels over the right upper pelvis without significant change as this measures 4.3 x 4.9 cm. The pigtail drainage catheter is immediately posterior and slightly inferior to this persistent fluid collection. No evidence of free air. There are persistent air-filled mildly dilated small bowel loops likely secondary ileus to this inflammatory/infectious process. There is moderate diverticulosis of the colon. Remainder the exam is unchanged.  IMPRESSION: Right gluteal pigtail drainage catheter in place with pigtail just right of midline and interval resolution of the fluid collections over the right perirectal region and vaginal cuff. Persistent fluid collection over the left pelvis inferior lateral to the adjacent sigmoid colon with slight increase in size measuring 3.1 x 5.2 cm. Stable fluid collection with a few small air-fluid levels over the right upper pelvis. The adjacent rectosigmoid colon  is well opacified with contrast as there is no evidence of contrast extravasation/ leak or fistula.  Continued persistent dilated fluid and air-filled small bowel loops likely secondary ileus to the pelvic infectious/inflammatory process.  Diverticulosis of the colon.   Electronically Signed   By: Marin Olp M.D.    On: 01/30/2014 17:11   Dg Cystogram  01/30/2014   CLINICAL DATA:  Pelvic abscesses.  EXAM: CYSTOGRAM  TECHNIQUE: After catheterization of the urinary bladder following sterile technique the bladder was filled with 300 mL Cysto-Hypaque 30% by drip infusion. Serial spot images were obtained during bladder filling and post draining.  FLUOROSCOPY TIME:  2 min 9 seconds slow pulsed fluoroscopy adjusted for body habitus  COMPARISON:  CT scans dated 04/14 and 01/30/2014  FINDINGS: Contrast was instilled through the indwelling Foley catheter. Contrast is seen in the colon from the previous CT scan. I placed the patient in the lateral projection and filled the bladder. Multiple radiographs were then obtained in multiple projections which demonstrate no evidence of extravasation of contrast. No visible fistula to the bowel.  Abscess drainage catheter is noted. Post drainage radiograph demonstrates no contrast in the abscess drainage catheter.  IMPRESSION: Normal cystogram.   Electronically Signed   By: Rozetta Nunnery M.D.   On: 01/30/2014 17:45   Dg Abd Acute W/chest  01/30/2014   CLINICAL DATA:  Shortness of breath.  Diffuse abdominal pain.  EXAM: ACUTE ABDOMEN SERIES (ABDOMEN 2 VIEW & CHEST 1 VIEW)  COMPARISON:  Chest x-ray dated 01/29/2014 and CT scan dated 01/28/2014  FINDINGS: Right middle lobe atelectasis has improved. There is still slight pulmonary vascular congestion. Slight linear atelectasis at the left lung base. Tiny bilateral effusions.  No free air in the abdomen. Proximal small bowel remains dilated. Contrast has passed into the nondistended colon. Abscess drainage catheter present in the right side of the pelvis.  IMPRESSION: Persistent pattern of partial small bowel obstruction. Contrast has moved into the nondistended colon.   Electronically Signed   By: Rozetta Nunnery M.D.   On: 01/30/2014 13:30    Medications:  Scheduled: . albuterol  2.5 mg Nebulization Q6H  . ceFEPime (MAXIPIME) IV  2 g  Intravenous Q24H  . enoxaparin (LOVENOX) injection  40 mg Subcutaneous Q24H  . famotidine (PEPCID) IV  20 mg Intravenous Q12H  . levothyroxine  37.5 mcg Intravenous Daily  . metoprolol  2.5 mg Intravenous 4 times per day  . metronidazole  500 mg Intravenous Q8H  . vancomycin  1,000 mg Intravenous Q12H   Continuous: . sodium chloride 10 mL/hr at 01/29/14 2100  . sodium chloride 50 mL/hr at 01/30/14 1822    Assessment/Plan: 1) Ileus. 2) Pelvic fluid collections.   The official report for her KUB is pending, but she appears to have a persistent ileus.  She is markedly tympanic on physical examination.  Her mobility is limited at this time.  Plan: 1) Continue with NG tube. 2) Follow cultures.  Negative cultures so far.  LOS: 5 days   Beryle Beams 02/01/2014, 9:31 AM

## 2014-02-01 NOTE — Progress Notes (Signed)
Patient ID: Laura Frey, female   DOB: 11-27-1931, 80 y.o.   MRN: [id number]    Subjective: Pt states that she continues to feel a little better.  Less bloated.  No vomiting.  No flatus.  Small smear of BM when coughed.    Objective: Vital signs in last 24 hours: Temp:  [98 F (36.7 C)-99.2 F (37.3 C)] 99.2 F (37.3 C) (04/18 0528) Pulse Rate:  [96-106] 104 (04/18 0528) Resp:  [20-22] 20 (04/18 0528) BP: (125-178)/(49-70) 153/58 mmHg (04/18 0528) SpO2:  [91 %-100 %] 91 % (04/18 0831) Last BM Date: 01/31/14  Cystogram 01/30/14:  was normal/no extravasation, no fistula to the bowel. CT pelvis 01/30/14: 1.  Resolution of the fluid collection with the current drain. right of midline and interval resolution of the fluid collections over the right perirectal region and vaginal cuff.  2. Persistent fluid collection over the left pelvis inferior lateral to the adjacent sigmoid colon with slight increase in size measuring 3.1 x 5.2 cm. 3.  Stable fluid collection with a few small air-fluid levels over the right upper pelvis. The adjacent rectosigmoid colon is well opacified with contrast as there is no evidence of contrast extravasation/ leak or fistula. 4. Persistent dilated fluid levels in SB, likely secondary ileus to the pelvi infectious/inflammatory process.   Intake/Output from previous day: 04/17 0701 - 04/18 0700 In: 1690 [I.V.:590; NG/GT:100; IV Piggyback:850] Out: 1150 [Urine:500; Emesis/NG output:200; Blood:450] Intake/Output this shift:    General appearance: alert, cooperative and no distress GI: soft,  sl distended, mild tenderness mid lower left abdomen.  Lab Results:   Recent Labs  01/31/14 0106 02/01/14 0420  WBC 24.0* 19.1*  HGB 8.9* 8.8*  HCT 27.9* 26.3*  PLT 399 421*    BMET  Recent Labs  01/31/14 0106 02/01/14 0420  NA 137 142  K 3.4* 3.1*  CL 102 104  CO2 23 25  GLUCOSE 106* 100*  BUN 11 10  CREATININE 0.93 0.86  CALCIUM 8.2* 8.0*    PT/INR  Recent Labs  01/29/14 1246 01/30/14 0317  LABPROT 16.0* 16.0*  INR 1.31 1.31     Recent Labs Lab 01/30/14 0317 01/31/14 0106 02/01/14 0420  AST 19 14 24   ALT 10 9 9   ALKPHOS 128* 92 86  BILITOT 0.5 0.4 0.3  PROT 6.4 5.8* 5.7*  ALBUMIN 2.6* 2.3* 2.3*     Lipase  No results found for this basename: lipase     Studies/Results: Ct Pelvis Wo Contrast  01/30/2014   CLINICAL DATA:  Pelvic abscess of unclear etiology. Rectal contrast given to evaluate for leak/fistula.  EXAM: CT PELVIS WITHOUT CONTRAST  TECHNIQUE: Multidetector CT imaging of the pelvis was performed following the standard protocol without intravenous contrast. Rectal contrast was administered via rectal catheter. Patient had difficulty keeping balloon catheter in the rectum.  COMPARISON:  01/28/2014  FINDINGS: Foley catheter is present within a collapsed bladder. Rectal catheter is in place with contrast filling the rectosigmoid colon and remainder of the visualized colon. There is no evidence of contrast extravasation/leak. B fluid collection just lateral and inferior to the sigmoid colon in the left pelvis is unchanged to slightly larger measuring 3 x 5.2 cm with less air within the fluid collection. There is a right gluteal pigtail drainage catheter with pigtail over the right pelvis as there has been resolution of the deep pelvic fluid collections in the region of the vaginal cuff and right perirectal region. There is a continued fluid collection with a  few small air-fluid levels over the right upper pelvis without significant change as this measures 4.3 x 4.9 cm. The pigtail drainage catheter is immediately posterior and slightly inferior to this persistent fluid collection. No evidence of free air. There are persistent air-filled mildly dilated small bowel loops likely secondary ileus to this inflammatory/infectious process. There is moderate diverticulosis of the colon. Remainder the exam is unchanged.   IMPRESSION: Right gluteal pigtail drainage catheter in place with pigtail just right of midline and interval resolution of the fluid collections over the right perirectal region and vaginal cuff. Persistent fluid collection over the left pelvis inferior lateral to the adjacent sigmoid colon with slight increase in size measuring 3.1 x 5.2 cm. Stable fluid collection with a few small air-fluid levels over the right upper pelvis. The adjacent rectosigmoid colon is well opacified with contrast as there is no evidence of contrast extravasation/ leak or fistula.  Continued persistent dilated fluid and air-filled small bowel loops likely secondary ileus to the pelvic infectious/inflammatory process.  Diverticulosis of the colon.   Electronically Signed   By: Marin Olp M.D.   On: 01/30/2014 17:11   Dg Cystogram  01/30/2014   CLINICAL DATA:  Pelvic abscesses.  EXAM: CYSTOGRAM  TECHNIQUE: After catheterization of the urinary bladder following sterile technique the bladder was filled with 300 mL Cysto-Hypaque 30% by drip infusion. Serial spot images were obtained during bladder filling and post draining.  FLUOROSCOPY TIME:  2 min 9 seconds slow pulsed fluoroscopy adjusted for body habitus  COMPARISON:  CT scans dated 04/14 and 01/30/2014  FINDINGS: Contrast was instilled through the indwelling Foley catheter. Contrast is seen in the colon from the previous CT scan. I placed the patient in the lateral projection and filled the bladder. Multiple radiographs were then obtained in multiple projections which demonstrate no evidence of extravasation of contrast. No visible fistula to the bowel.  Abscess drainage catheter is noted. Post drainage radiograph demonstrates no contrast in the abscess drainage catheter.  IMPRESSION: Normal cystogram.   Electronically Signed   By: Rozetta Nunnery M.D.   On: 01/30/2014 17:45   Dg Abd Acute W/chest  01/30/2014   CLINICAL DATA:  Shortness of breath.  Diffuse abdominal pain.  EXAM:  ACUTE ABDOMEN SERIES (ABDOMEN 2 VIEW & CHEST 1 VIEW)  COMPARISON:  Chest x-ray dated 01/29/2014 and CT scan dated 01/28/2014  FINDINGS: Right middle lobe atelectasis has improved. There is still slight pulmonary vascular congestion. Slight linear atelectasis at the left lung base. Tiny bilateral effusions.  No free air in the abdomen. Proximal small bowel remains dilated. Contrast has passed into the nondistended colon. Abscess drainage catheter present in the right side of the pelvis.  IMPRESSION: Persistent pattern of partial small bowel obstruction. Contrast has moved into the nondistended colon.   Electronically Signed   By: Rozetta Nunnery M.D.   On: 01/30/2014 13:30    Medications: . albuterol  2.5 mg Nebulization Q6H  . ceFEPime (MAXIPIME) IV  2 g Intravenous Q24H  . enoxaparin (LOVENOX) injection  40 mg Subcutaneous Q24H  . famotidine (PEPCID) IV  20 mg Intravenous Q12H  . levothyroxine  37.5 mcg Intravenous Daily  . metoprolol  2.5 mg Intravenous 4 times per day  . metronidazole  500 mg Intravenous Q8H  . vancomycin  1,000 mg Intravenous Q12H    Assessment/Plan S/p right transgluteal abscess drain 01/29/14. (simple serous fluid. Cr on fluid was 1. Doesn't appear to be c/w intestinal source. F/u cx from aspiration. Could  just be sterile fluid collection.)  Cultures show no growth so far, C diff ws negative  2. Admitted with: SOB, Cough, fever, confusion, weakness,fever,  3. Right basilar infiltrates (bilateral effusions on CT)  4. UTI  5. Possible sepsis with hypotension  6. Hx of tobacco use  7. Hx of hypertension  8. Hx of hypothyroid  9. Hx of Thalassemia  10. Hx of back pain with cervical decompression 04/2013  11. Hx of abdominal hysterectomy and bladder repair, 1978. Recent evaluation by Urology with instrumentation in their office this year.   Plan:  IV antibiotics. Improving clinically. Continue npo/ngt Contrast has moved to colon, so does not appear obstructed. Sepsis ?  Enteritis.     LOS: 5 days    Stark Klein 02/01/2014

## 2014-02-01 NOTE — Progress Notes (Signed)
Pt put out 120ml of dark brown output from ng tube from 7a - 7p. Vwilliams,rn.

## 2014-02-01 NOTE — Progress Notes (Signed)
Delayed note entry  Wbc up. Some lower abd pain.  Alert, obese Soft, nd, very mild TTP.  Drain - serous  Confusing case.  Pelvic abscess appears to be sterile fluid collection so far Pt non-toxic appearing, doesn't appear to have impending abd catastrophe Will probably need repeat imaging if wbc stays up - ct cystogram/pelvis with rectal contrast Discussed with family  Leighton Ruff. Redmond Pulling, MD, FACS General, Bariatric, & Minimally Invasive Surgery [facility], [place]

## 2014-02-02 MED ORDER — ALBUTEROL SULFATE (2.5 MG/3ML) 0.083% IN NEBU
2.5000 mg | INHALATION_SOLUTION | Freq: Three times a day (TID) | RESPIRATORY_TRACT | Status: DC
  Administered 2014-02-02 – 2014-02-04 (×6): 2.5 mg via RESPIRATORY_TRACT
  Filled 2014-02-02 (×5): qty 3

## 2014-02-02 MED ORDER — FENTANYL CITRATE 0.05 MG/ML IJ SOLN
12.5000 ug | INTRAMUSCULAR | Status: DC | PRN
  Administered 2014-02-03: 12.5 ug via INTRAVENOUS
  Filled 2014-02-02: qty 2

## 2014-02-02 MED ORDER — LORAZEPAM 2 MG/ML IJ SOLN
0.5000 mg | Freq: Once | INTRAMUSCULAR | Status: AC
  Administered 2014-02-02: 0.5 mg via INTRAVENOUS
  Filled 2014-02-02: qty 1

## 2014-02-02 NOTE — Progress Notes (Signed)
Note: This document was prepared with digital dictation and possible smart phrase technology. Any transcriptional errors that result from this process are unintentional.   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  Started on Zpac by PCP, got worse  and developed toxic metabolic encephalopathy and was transferred to [facility]. She underwent CT scan abdomen/pelvis 4/14 showing 3 areas of potential abscesses, ?Vaginal dehiscence, ?P SBO and Gen surgery/IR consulted. Perc drain placed by IR 4/15 yielding clear fluid with ng as yet.  Given she seemed a little more distended, NG tube placed 4/16 with ~750 cc out bilious material Rpt Ct performed 4/16 showed a persistent fluid collection over over sigmoid colon 3.1 x 5.2 without extravasation suggestive of a leak +m ileus and Cystogram showed no leak of contrast IR was re-consulted to evalaute fluid collection which was not possible.  Because of persisting symptoms with white count and abd pain GI was consulted who thought this could have been a perforated and then healed diverticulum and recommended Urology being made aware.   She has done fairly well over past 2 days with NGT and put out about 750 cc 4/16-4/17 from NGT and subesequently NGtueb was d/c altogether 4/19 as output had decreased   Past medical history-As per Problem list Chart reviewed as below-   Consultants:  Gen surgery  IR   Procedures:  CT scan ab  PERC drain  4/15  Antibiotics:  Vanc 4/13  Cefepime 4/13  Flagyl 4/13 l  Subjective   Well Sipping soft drink. NO issues with abd pain Stool + flatusdeneis fever  or chills or other concerns. Daughter bedsdie     Objective    Interim History: Nursing reports some confusion  Telemetry: Sinus tach 120's   Objective: Filed Vitals:   02/02/14 0550 02/02/14 0825 02/02/14 1323 02/02/14 1528  BP: 145/51  138/58   Pulse: 97  102   Temp: 97.5 F (36.4 C)  98.3 F (36.8 C)   TempSrc: Axillary  Oral   Resp: 20  20   Height:      Weight:      SpO2: 90% 92% 96% 90%    Intake/Output Summary (Last 24 hours) at 02/02/14 1633 Last data filed at 02/02/14 1300  Gross per 24 hour  Intake 2204.5 ml  Output   1311 ml  Net  893.5 ml    Exam:  General:  EOMI, NCAT Cardiovascular: s1 s2 no m/r/g-tachy Respiratory: clear, no added sound Abdomen:  Soft, less distended than prior with no rebound/guarding Skin no Le edema Neuro intact   Data Reviewed: Basic Metabolic Panel:  Recent Labs Lab 01/28/14 0324 01/29/14 0305 01/30/14 0317 01/31/14 0106 02/01/14 0420  NA 138 138 138 137 142  K 3.6* 3.3* 3.5* 3.4* 3.1*  CL 103 103 100 102 104  CO2 23 22 22 23 25   GLUCOSE 145* 127* 116* 106* 100*  BUN 13 10 10 11 10   CREATININE 0.94 0.96 1.06 0.93 0.86  CALCIUM 7.8* 7.5* 8.2* 8.2* 8.0*   Liver Function Tests:  Recent Labs Lab 01/30/14 0317 01/31/14 0106 02/01/14 0420  AST 19 14 24   ALT 10 9 9   ALKPHOS 128* 92  86  BILITOT 0.5 0.4 0.3  PROT 6.4 5.8* 5.7*  ALBUMIN 2.6* 2.3* 2.3*   No results found for this basename: LIPASE, AMYLASE,  in the last 168 hours No results found for this basename: AMMONIA,  in the last 168 hours CBC:  Recent Labs Lab 01/28/14 0324 01/29/14 0305 01/30/14 0317 01/31/14 0106 02/01/14 0420  WBC 16.3* 28.1* 32.5* 24.0* 19.1*  NEUTROABS  --   --   --  20.7* 15.7*  HGB 9.6* 9.6* 10.3* 8.9* 8.8*  HCT 29.5* 29.0* 32.3* 27.9* 26.3*  MCV 63.4* 64.3* 63.8* 63.6* 61.7*  PLT 355 356 426* 399 421*   Cardiac Enzymes: No results found for this basename: CKTOTAL, CKMB, CKMBINDEX, TROPONINI,  in the last 168  hours BNP: No components found with this basename: POCBNP,  CBG: No results found for this basename: GLUCAP,  in the last 168 hours  Recent Results (from the past 240 hour(s))  MRSA PCR SCREENING     Status: None   Collection Time    01/27/14  8:56 PM      Result Value Ref Range Status   MRSA by PCR NEGATIVE  NEGATIVE Final   Comment:            The GeneXpert MRSA Assay (FDA     approved for NASAL specimens     only), is one component of a     comprehensive MRSA colonization     surveillance program. It is not     intended to diagnose MRSA     infection nor to guide or     monitor treatment for     MRSA infections.  CULTURE, BLOOD (ROUTINE X 2)     Status: None   Collection Time    01/27/14 10:39 PM      Result Value Ref Range Status   Specimen Description BLOOD BLOOD RIGHT FOREARM   Final   Special Requests BOTTLES DRAWN AEROBIC ONLY 8CC   Final   Culture  Setup Time     Final   Value: 01/28/2014 03:56     Performed at [facility]   Culture     Final   Value:        BLOOD CULTURE RECEIVED NO GROWTH TO DATE CULTURE WILL BE HELD FOR 5 DAYS BEFORE ISSUING A FINAL NEGATIVE REPORT     Performed at [facility]   Report Status PENDING   Incomplete  CULTURE, BLOOD (ROUTINE X 2)     Status: None   Collection Time    01/27/14 10:47 PM      Result Value Ref Range Status   Specimen Description BLOOD LEFT ANTECUBITAL   Final   Special Requests BOTTLES DRAWN AEROBIC AND ANAEROBIC 10CC   Final   Culture  Setup Time     Final   Value: 01/28/2014 03:56     Performed at [facility]   Culture     Final   Value:        BLOOD CULTURE RECEIVED NO GROWTH TO DATE CULTURE WILL BE HELD FOR 5 DAYS BEFORE ISSUING A FINAL NEGATIVE REPORT     Performed at [facility]   Report Status PENDING   Incomplete  URINE CULTURE     Status: None   Collection Time    01/27/14 10:53 PM      Result Value Ref Range Status   Specimen Description URINE, CLEAN CATCH   Final     Special Requests NONE  Final   Culture  Setup Time     Final   Value: 01/28/2014 05:28     Performed at [facility] Count     Final   Value: NO GROWTH     Performed at [facility]   Culture     Final   Value: NO GROWTH     Performed at [facility]   Report Status 01/29/2014 FINAL   Final  CLOSTRIDIUM DIFFICILE BY PCR     Status: None   Collection Time    01/28/14  4:36 AM      Result Value Ref Range Status   C difficile by pcr NEGATIVE  NEGATIVE Final   Comment: Performed at [facility]  BODY FLUID CULTURE     Status: None   Collection Time    01/29/14 12:43 PM      Result Value Ref Range Status   Specimen Description FLUID PELVIC   Final   Special Requests NONE   Final   Gram Stain     Final   Value: FEW WBC PRESENT,BOTH PMN AND MONONUCLEAR     NO ORGANISMS SEEN     Performed at [facility]   Culture     Final   Value: NO GROWTH 3 DAYS     Performed at [facility]   Report Status 02/01/2014 FINAL   Final     Studies:              All Imaging reviewed and is as per above notation   Scheduled Meds: . albuterol  2.5 mg Nebulization TID  . enoxaparin (LOVENOX) injection  40 mg Subcutaneous Q24H  . famotidine (PEPCID) IV  20 mg Intravenous Q12H  . levothyroxine  37.5 mcg Intravenous Daily  . metoprolol  2.5 mg Intravenous 4 times per day   Continuous Infusions: . sodium chloride 10 mL/hr at 01/29/14 2100  . sodium chloride 50 mL/hr at 02/01/14 2325     Assessment/Plan:  1. Sepsis-likely multifactorial 2/2 to multiple abcesses + hcap/uti  Urine culture from Luke=K Pneumonia-current antibiotics would cover this.  Abscess drain culture neg so far.  Unclear if this is a physiologic fluid collection [dehiscence of hysterectomy?]-could be multiple processes as has increase in WBC 20  ???32 ??? 24  ???19 on 4/18,-Will rpt CBC 4/20.  Further drainage  Not possible per IR--??healed diverticular abcesses.   Continue broad spectrum Abx-Cefepime/Vancomycin/Zosyn-all other non-essential meds held for now.  Appreciate IR and Surgery continued Input-completely d/c Abc 4/19 and get CBC and Watch fever curve next 24 hours 2. SBO-resolved.  NGT is out.  Monitor for now 3. Sinus tachycardia-new finding this admission-likely related to #1.  Monitor.  Note not taking Home dose metoprolol XL 200 daily so could be rebound tachycardia.  Implement at lower dose once not NPO.  For now continue IV metoprolol 2.5-5 mg q6 hour-will uptitrate home meds in next 1-2 days 4. Htn-see above discussion.  Her HCTZ/Diovan, amlodipine is also on hold 5. Hypothyroid-TSH 7.37, T4 0.74 but would not adjust her medications currently.  As she is acutely ill.  Would repeat labs in 1 month or so when more clinically stable 6. H/o Thalassemia 7. Hyperglycemia-A1c 6.0.  Monitor sugars -ranges 100's 8. Cervical spondylosis s.p ACDF 04/2013 9. Gerd-Continue pantoprazole 40 iv bid-changed to IV Famotidine given critically low supply of this 10. HRT-hold Premarin 0.45 daily for now.  Code Status: Full Family Communication: d/w daughter bedside  4/19 Disposition Plan:  Tele-possible home 1-2 days   Verneita Griffes, MD  [facility] Pager [phone] 02/02/2014, 4:33 PM    LOS: 6 days

## 2014-02-02 NOTE — Progress Notes (Signed)
Subjective: Feels well.  She reports flatus.  Objective: Vital signs in last 24 hours: Temp:  [97.5 F (36.4 C)-98.9 F (37.2 C)] 97.5 F (36.4 C) (04/19 0550) Pulse Rate:  [97-112] 97 (04/19 0550) Resp:  [20] 20 (04/19 0550) BP: (138-145)/(51-57) 145/51 mmHg (04/19 0550) SpO2:  [90 %-96 %] 92 % (04/19 0825) Last BM Date: 02/02/14  Intake/Output from previous day: 02-10-2023 0701 - 04/19 0700 In: 1674.5 [I.V.:1264.5; IV Piggyback:400] Out: 6122 [Urine:1300; Emesis/NG output:50; Drains:10] Intake/Output this shift:    General appearance: alert GI: soft, non-tender; bowel sounds normal; no masses,  no organomegaly  Lab Results:  Recent Labs  01/31/14 0106 2014-02-09 0420  WBC 24.0* 19.1*  HGB 8.9* 8.8*  HCT 27.9* 26.3*  PLT 399 421*   BMET  Recent Labs  01/31/14 0106 09-Feb-2014 0420  NA 137 142  K 3.4* 3.1*  CL 102 104  CO2 23 25  GLUCOSE 106* 100*  BUN 11 10  CREATININE 0.93 0.86  CALCIUM 8.2* 8.0*   LFT  Recent Labs  2014/02/09 0420  PROT 5.7*  ALBUMIN 2.3*  AST 24  ALT 9  ALKPHOS 86  BILITOT 0.3   PT/INR No results found for this basename: LABPROT, INR,  in the last 72 hours Hepatitis Panel No results found for this basename: HEPBSAG, HCVAB, HEPAIGM, HEPBIGM,  in the last 72 hours C-Diff No results found for this basename: CDIFFTOX,  in the last 72 hours Fecal Lactopherrin No results found for this basename: FECLLACTOFRN,  in the last 72 hours  Studies/Results: Dg Abd 2 Views  Feb 09, 2014   CLINICAL DATA:  Abdominal pain and distention  EXAM: ABDOMEN - 2 VIEW  COMPARISON:  January 30, 2014  FINDINGS: Supine and upright images were obtained. A drainage catheter remains in the right pelvis region. Nasogastric tube tip is in the proximal duodenum. There remains several loops of dilated small bowel. Contrast is seen in a nondilated colon. There are multiple sigmoid diverticula. No free air. There is a phlebolith in left pelvis.  IMPRESSION: Residual bowel  dilatation without appreciable air-fluid levels. Suspect residual partial bowel obstruction. Contrast is seen in a nondistended colon.  Multiple colonic diverticula.  Nasogastric tube tip in proximal duodenum. Drainage catheter and right pelvis again noted. No free air.   Electronically Signed   By: Lowella Grip M.D.   On: February 09, 2014 14:23    Medications:  Scheduled: . albuterol  2.5 mg Nebulization TID  . ceFEPime (MAXIPIME) IV  1 g Intravenous Q8H  . enoxaparin (LOVENOX) injection  40 mg Subcutaneous Q24H  . famotidine (PEPCID) IV  20 mg Intravenous Q12H  . levothyroxine  37.5 mcg Intravenous Daily  . metoprolol  2.5 mg Intravenous 4 times per day  . metronidazole  500 mg Intravenous Q8H  . vancomycin  1,000 mg Intravenous Q12H   Continuous: . sodium chloride 10 mL/hr at 01/29/14 2100  . sodium chloride 50 mL/hr at February 09, 2014 2325    Assessment/Plan: 1) Ileus - improving. 2) Pelvic fluid collections.   Clinically she is improving.  She is passing some flatus and her NG tube was discontinued.  Plan: 1) Continue with supportive care. 2) Continue with antibiotics.   LOS: 6 days   Laura Frey 02/02/2014, 11:37 AM

## 2014-02-02 NOTE — Progress Notes (Addendum)
Rt made pt nebs Albuterol TID from Albuterol Q6. Pt here for sepsis/bowel obstruction. Pt still has PRN order if needed. Pt has had no chest -x this admission. Pt has some upper airway WH at times. Pt has refused some nebs this admission. Pt only takes Albuterol inhaler at home PRN. Pt aware of change, no distress noted at this time.

## 2014-02-02 NOTE — Progress Notes (Signed)
Subjective:   1- Pelvic Fluid Collections - Admitted 4/13 in transfer form [place] for possible pelvic abscesses found by CT on eval abd pain, luekocytosis, and bowel osbtruction. No recent GU intrumentation. CT and cystogram 4/15 w/o bladder leak / fistula. Gram stain and CX sterile from IR placed drain. Leukocytosis improving and GI contrast now to colon by serial imaging. Initial fluid Cr same as serum at 1.0, recheck Mar 03, 2023 and 0.7 (same as serum) She has h/o vaginal hysterectomy with cystotomy and immediate repair in 1978.   2 - Recurrent Cystitis - Follows with MacDiarmid for recurrent Cystitis, on premarin proph only. UCX 4/15, this admission, sterile.   3 - Atrohic Vaginitis - On premarin topical twice weekly for senile atrophic vaginitis to reduce dysuria and help prevent UTI / vaginal acidification.   4 - Urge Urinary Incontinence - On Vesicare 5mg  daily at baseline for urodynamically provedn urge incontincne, follows with MacDiarmid as outpatient.  Today Fidencia is feeling a little better. Had some flatus and small BM overnight. Throat sore from NGT. No vaginal discharge or drain problems.      Objective: Vital signs in last 24 hours: Temp:  [97.5 F (36.4 C)-98.9 F (37.2 C)] 97.5 F (36.4 C) (04/19 0550) Pulse Rate:  [97-112] 97 (04/19 0550) Resp:  [20] 20 (04/19 0550) BP: (138-145)/(51-57) 145/51 mmHg (04/19 0550) SpO2:  [90 %-96 %] 90 % (04/19 0550) Last BM Date: 03-02-14  Intake/Output from previous day: Mar 03, 2023 0701 - 04/19 0700 In: 1674.5 [I.V.:1264.5; IV Piggyback:400] Out: 0254 [Urine:1300; Emesis/NG output:50; Drains:10] Intake/Output this shift:    General appearance: alert, cooperative and family at bedside Head: Normocephalic, without obvious abnormality, atraumatic Eyes: PERRL Nose: NGT in place wtih scant output Neck: supple, symmetrical, trachea midline Back: symmetric, no curvature. ROM normal. No CVA tenderness. Resp: no labored  breathing Cardio: Nl rate GI: soft, non-tender; bowel sounds normal; no masses,  no organomegaly Pelvic: external genitalia normal, vagina normal without discharge and foley c/d/i with clear yellow urine Extremities: extremities normal, atraumatic, no cyanosis or edema Pulses: 2+ and symmetric Skin: Skin color, texture, turgor normal. No rashes or lesions Lymph nodes: Cervical, supraclavicular, and axillary nodes normal. Neurologic: Grossly normal Incision/Wound: JP with straw colored fluid. Non purlulent  Lab Results:   Recent Labs  01/31/14 0106 03-02-2014 0420  WBC 24.0* 19.1*  HGB 8.9* 8.8*  HCT 27.9* 26.3*  PLT 399 421*   BMET  Recent Labs  01/31/14 0106 2014/03/02 0420  NA 137 142  K 3.4* 3.1*  CL 102 104  CO2 23 25  GLUCOSE 106* 100*  BUN 11 10  CREATININE 0.93 0.86  CALCIUM 8.2* 8.0*   PT/INR No results found for this basename: LABPROT, INR,  in the last 72 hours ABG No results found for this basename: PHART, PCO2, PO2, HCO3,  in the last 72 hours  Studies/Results: Dg Abd 2 Views  03-02-14   CLINICAL DATA:  Abdominal pain and distention  EXAM: ABDOMEN - 2 VIEW  COMPARISON:  January 30, 2014  FINDINGS: Supine and upright images were obtained. A drainage catheter remains in the right pelvis region. Nasogastric tube tip is in the proximal duodenum. There remains several loops of dilated small bowel. Contrast is seen in a nondilated colon. There are multiple sigmoid diverticula. No free air. There is a phlebolith in left pelvis.  IMPRESSION: Residual bowel dilatation without appreciable air-fluid levels. Suspect residual partial bowel obstruction. Contrast is seen in a nondistended colon.  Multiple colonic diverticula.  Nasogastric tube tip in proximal duodenum. Drainage catheter and right pelvis again noted. No free air.   Electronically Signed   By: Lowella Grip M.D.   On: 02/01/2014 14:23    Anti-infectives: Anti-infectives   Start     Dose/Rate Route  Frequency Ordered Stop   02/01/14 1800  ceFEPIme (MAXIPIME) 1 g in dextrose 5 % 50 mL IVPB     1 g 100 mL/hr over 30 Minutes Intravenous Every 8 hours 02/01/14 1258     01/31/14 1000  vancomycin (VANCOCIN) IVPB 1000 mg/200 mL premix     1,000 mg 200 mL/hr over 60 Minutes Intravenous Every 12 hours 01/31/14 0228     01/31/14 0600  ceFEPIme (MAXIPIME) 2 g in dextrose 5 % 50 mL IVPB  Status:  Discontinued     2 g 100 mL/hr over 30 Minutes Intravenous Every 24 hours 01/30/14 1059 02/01/14 1258   01/28/14 1800  metroNIDAZOLE (FLAGYL) IVPB 500 mg     500 mg 100 mL/hr over 60 Minutes Intravenous Every 8 hours 01/28/14 1725     01/28/14 1200  vancomycin (VANCOCIN) IVPB 750 mg/150 ml premix  Status:  Discontinued     750 mg 150 mL/hr over 60 Minutes Intravenous Every 12 hours 01/27/14 2158 01/31/14 0228   01/28/14 0200  vancomycin (VANCOCIN) IVPB 750 mg/150 ml premix     750 mg 150 mL/hr over 60 Minutes Intravenous  Once 01/27/14 2158 01/28/14 0336   01/27/14 2200  ceFEPIme (MAXIPIME) 1 g in dextrose 5 % 50 mL IVPB  Status:  Discontinued     1 g 100 mL/hr over 30 Minutes Intravenous 3 times per day 01/27/14 2127 01/30/14 1058      Assessment/Plan:   1- Pelvic Fluid Collections - Unclear etiology. DDX perofrated / sealed off prior diverticulitis v. spontneous formation v. Sterile collection. No evidence of GU fistula with very good quality cystogram this admission and drain Cr x 2 confirms not urine. I do not feel primary urolgoic intervention is waranted and agree with conservative measures with emperic ABX and maximal drainage as she is improving clinically. I will RX to DC Foley.  2 - Recurrent Cystitis - UCX sterile this admission. Continue outpatient regimen.   3 - Atrohic Vaginitis - Continue premarin for this and primary prevention of UTI. This will also keep vaginal tissue more robust / decrease chances of any cuff issues.   4 - Urge Urinary Incontinence - Ceratinly reasonable to hold  anticholergics in setting of likely funcitonal bowel obstruciton.   5 - I will make Dr. Matilde Sprang aware of pt's admission on Monday. Please call anytime with questions in interval.   Laura Frey 02/02/2014

## 2014-02-02 NOTE — Progress Notes (Signed)
Patient ID: Laura Frey, female   DOB: 06-26-1932, 80 y.o.   MRN: [id number]    Subjective: Pt had several BMs yesterday.  Bloating, pain improved.    Objective: Vital signs in last 24 hours: Temp:  [97.5 F (36.4 C)-98.9 F (37.2 C)] 97.5 F (36.4 C) (04/19 0550) Pulse Rate:  [97-112] 97 (04/19 0550) Resp:  [20] 20 (04/19 0550) BP: (138-145)/(51-57) 145/51 mmHg (04/19 0550) SpO2:  [90 %-96 %] 92 % (04/19 0825) Last BM Date: 02/02/14   Intake/Output from previous day: 02-12-2023 0701 - 04/19 0700 In: 1674.5 [I.V.:1264.5; IV Piggyback:400] Out: 9937 [Urine:1300; Emesis/NG output:50; Drains:10]  Intake/Output this shift:   General appearance: alert, cooperative and no distress GI: soft,  sl distended, non tender.  Lab Results:   Recent Labs  01/31/14 0106 02/11/2014 0420  WBC 24.0* 19.1*  HGB 8.9* 8.8*  HCT 27.9* 26.3*  PLT 399 421*    BMET  Recent Labs  01/31/14 0106 02-11-2014 0420  NA 137 142  K 3.4* 3.1*  CL 102 104  CO2 23 25  GLUCOSE 106* 100*  BUN 11 10  CREATININE 0.93 0.86  CALCIUM 8.2* 8.0*   PT/INR No results found for this basename: LABPROT, INR,  in the last 72 hours   Recent Labs Lab 01/30/14 0317 01/31/14 0106 02-11-14 0420  AST 19 14 24   ALT 10 9 9   ALKPHOS 128* 92 86  BILITOT 0.5 0.4 0.3  PROT 6.4 5.8* 5.7*  ALBUMIN 2.6* 2.3* 2.3*     Lipase  No results found for this basename: lipase     Studies/Results: Dg Abd 2 Views  02-11-14   CLINICAL DATA:  Abdominal pain and distention  EXAM: ABDOMEN - 2 VIEW  COMPARISON:  January 30, 2014  FINDINGS: Supine and upright images were obtained. A drainage catheter remains in the right pelvis region. Nasogastric tube tip is in the proximal duodenum. There remains several loops of dilated small bowel. Contrast is seen in a nondilated colon. There are multiple sigmoid diverticula. No free air. There is a phlebolith in left pelvis.  IMPRESSION: Residual bowel dilatation without appreciable air-fluid  levels. Suspect residual partial bowel obstruction. Contrast is seen in a nondistended colon.  Multiple colonic diverticula.  Nasogastric tube tip in proximal duodenum. Drainage catheter and right pelvis again noted. No free air.   Electronically Signed   By: Lowella Grip M.D.   On: 11-Feb-2014 14:23    Medications: . albuterol  2.5 mg Nebulization TID  . ceFEPime (MAXIPIME) IV  1 g Intravenous Q8H  . enoxaparin (LOVENOX) injection  40 mg Subcutaneous Q24H  . famotidine (PEPCID) IV  20 mg Intravenous Q12H  . levothyroxine  37.5 mcg Intravenous Daily  . metoprolol  2.5 mg Intravenous 4 times per day  . metronidazole  500 mg Intravenous Q8H  . vancomycin  1,000 mg Intravenous Q12H    Assessment/Plan S/p right transgluteal abscess drain 01/29/14. (simple serous fluid. Cr on fluid was 1. Doesn't appear to be c/w intestinal source. F/u cx from aspiration. Could just be sterile fluid collection.)  Cultures show no growth so far, C diff ws negative  2. Admitted with: SOB, Cough, fever, confusion, weakness,fever,  3. Right basilar infiltrates (bilateral effusions on CT)  4. UTI  5. Possible sepsis with hypotension  6. Hx of tobacco use  7. Hx of hypertension  8. Hx of hypothyroid  9. Hx of Thalassemia  10. Hx of back pain with cervical decompression 04/2013  11. Hx  of abdominal hysterectomy and bladder repair, 1978. Recent evaluation by Urology with instrumentation in their office this year.   Plan:  IV antibiotics. Improving clinically. D/c ngt Clears.        LOS: 6 days    Stark Klein 02/02/2014

## 2014-02-03 DIAGNOSIS — R188 Other ascites: Secondary | ICD-10-CM

## 2014-02-03 DIAGNOSIS — K929 Disease of digestive system, unspecified: Secondary | ICD-10-CM

## 2014-02-03 DIAGNOSIS — K560 Paralytic ileus: Secondary | ICD-10-CM

## 2014-02-03 LAB — COMPREHENSIVE METABOLIC PANEL
ALBUMIN: 2.3 g/dL — AB (ref 3.5–5.2)
ALK PHOS: 76 U/L (ref 39–117)
ALT: 12 U/L (ref 0–35)
AST: 25 U/L (ref 0–37)
BILIRUBIN TOTAL: 0.3 mg/dL (ref 0.3–1.2)
BUN: 8 mg/dL (ref 6–23)
CO2: 27 mEq/L (ref 19–32)
CREATININE: 0.78 mg/dL (ref 0.50–1.10)
Calcium: 8 mg/dL — ABNORMAL LOW (ref 8.4–10.5)
Chloride: 101 mEq/L (ref 96–112)
GFR calc Af Amer: 90 mL/min — ABNORMAL LOW (ref >90)
GFR calc non Af Amer: 77 mL/min — ABNORMAL LOW (ref >90)
GLUCOSE: 105 mg/dL — AB (ref 70–99)
POTASSIUM: 2.6 meq/L — AB (ref 3.7–5.3)
Sodium: 141 mEq/L (ref 137–147)
Total Protein: 5.9 g/dL — ABNORMAL LOW (ref 6.0–8.3)

## 2014-02-03 LAB — CBC WITH DIFFERENTIAL/PLATELET
BASOS ABS: 0.2 10*3/uL — AB (ref 0.0–0.1)
Basophils Relative: 1 % (ref 0–1)
Eosinophils Absolute: 0.6 10*3/uL (ref 0.0–0.7)
Eosinophils Relative: 3 % (ref 0–5)
HCT: 29.5 % — ABNORMAL LOW (ref 36.0–46.0)
Hemoglobin: 9.7 g/dL — ABNORMAL LOW (ref 12.0–15.0)
LYMPHS ABS: 1.9 10*3/uL (ref 0.7–4.0)
Lymphocytes Relative: 10 % — ABNORMAL LOW (ref 12–46)
MCH: 20.4 pg — ABNORMAL LOW (ref 26.0–34.0)
MCHC: 32.9 g/dL (ref 30.0–36.0)
MCV: 62 fL — AB (ref 78.0–100.0)
MONO ABS: 1.9 10*3/uL — AB (ref 0.1–1.0)
MONOS PCT: 10 % (ref 3–12)
NEUTROS PCT: 76 % (ref 43–77)
Neutro Abs: 14.2 10*3/uL — ABNORMAL HIGH (ref 1.7–7.7)
PLATELETS: 450 10*3/uL — AB (ref 150–400)
RBC: 4.76 MIL/uL (ref 3.87–5.11)
RDW: 15 % (ref 11.5–15.5)
WBC: 18.8 10*3/uL — AB (ref 4.0–10.5)

## 2014-02-03 LAB — MAGNESIUM: Magnesium: 1.9 mg/dL (ref 1.5–2.5)

## 2014-02-03 LAB — CULTURE, BLOOD (ROUTINE X 2)
Culture: NO GROWTH
Culture: NO GROWTH

## 2014-02-03 MED ORDER — AMLODIPINE BESYLATE 5 MG PO TABS
5.0000 mg | ORAL_TABLET | Freq: Every day | ORAL | Status: DC
  Administered 2014-02-03 – 2014-02-04 (×2): 5 mg via ORAL
  Filled 2014-02-03 (×4): qty 1

## 2014-02-03 MED ORDER — POTASSIUM CHLORIDE CRYS ER 20 MEQ PO TBCR
40.0000 meq | EXTENDED_RELEASE_TABLET | Freq: Two times a day (BID) | ORAL | Status: DC
  Administered 2014-02-03 – 2014-02-04 (×3): 40 meq via ORAL
  Filled 2014-02-03 (×5): qty 2

## 2014-02-03 MED ORDER — POTASSIUM CHLORIDE 10 MEQ/100ML IV SOLN
10.0000 meq | INTRAVENOUS | Status: DC
  Administered 2014-02-03 (×3): 10 meq via INTRAVENOUS
  Filled 2014-02-03 (×4): qty 100

## 2014-02-03 MED ORDER — METOPROLOL SUCCINATE ER 100 MG PO TB24
200.0000 mg | ORAL_TABLET | Freq: Every day | ORAL | Status: DC
  Administered 2014-02-03 – 2014-02-04 (×2): 200 mg via ORAL
  Filled 2014-02-03 (×4): qty 2

## 2014-02-03 MED ORDER — LEVOTHYROXINE SODIUM 75 MCG PO TABS
75.0000 ug | ORAL_TABLET | Freq: Every day | ORAL | Status: DC
  Administered 2014-02-04: 75 ug via ORAL
  Filled 2014-02-03 (×2): qty 1

## 2014-02-03 MED ORDER — ALBUTEROL SULFATE HFA 108 (90 BASE) MCG/ACT IN AERS: 2.0000 | INHALATION_SPRAY | Freq: Four times a day (QID) | RESPIRATORY_TRACT | Status: DC | PRN

## 2014-02-03 NOTE — Progress Notes (Signed)
Physical Therapy Treatment Patient Details Name: Laura Frey MRN: [id number] DOB: [birth year] Today's Date: 02/03/2014    History of Present Illness Sepsis, SBO    PT Comments    Pt very groggy/slow and required increased time plus repeat VC's to complete task.  Assisted OOB to amb to BR.  Present with loose stools.  Assisted with hygiene.  Amb back to bed.  Pt very slow moving and required repeat cueing to stay on task and safety with turns/targeting commode/bed.  HIGH FALL RISK.   Follow Up Recommendations  Home health PT (son to assist at home)     Equipment Recommendations       Recommendations for Other Services       Precautions / Restrictions Precautions Precautions: Fall Restrictions Weight Bearing Restrictions: No    Mobility  Bed Mobility Overal bed mobility: Needs Assistance Bed Mobility: Supine to Sit;Sit to Supine     Supine to sit: Min assist;Mod assist Sit to supine: Min assist;Mod assist   General bed mobility comments: increased time and use of rails.  Transfers Overall transfer level: Needs assistance Equipment used: Rolling walker (2 wheeled) Transfers: Sit to/from Stand Sit to Stand: Min assist;Mod assist         General transfer comment: 75% VC's on proper tech and hand placement plus increased time to complete turns  Ambulation/Gait Ambulation/Gait assistance: Mod assist;Min assist Ambulation Distance (Feet): 20 Feet (10' x 2 to and from BR) Assistive device: Rolling walker (2 wheeled) Gait Pattern/deviations: Step-to pattern;Trunk flexed;Shuffle Gait velocity: very slow   General Gait Details: 50% VC's for upright posture and proper walker to self distance as pt kept pushing AD out front to far.  Difficulty initiating steppage.  Groggy.  Slow.   Stairs            Wheelchair Mobility    Modified Rankin (Stroke Patients Only)       Balance                                    Cognition                             Exercises      General Comments        Pertinent Vitals/Pain     Home Living                      Prior Function            PT Goals (current goals can now be found in the care plan section) Progress towards PT goals: Progressing toward goals    Frequency  Min 3X/week    PT Plan      Co-evaluation             End of Session Equipment Utilized During Treatment: Gait belt Activity Tolerance: Patient tolerated treatment well Patient left: in bed;with call bell/phone within reach;with family/visitor present     Time: 1350-1430 PT Time Calculation (min): 40 min  Charges:  $Gait Training: 23-37 mins $Therapeutic Activity: 8-22 mins                    G Codes:      Rica Koyanagi  PTA [facility]  Acute  Rehab Pager      [phone]

## 2014-02-03 NOTE — Progress Notes (Signed)
Note: This document was prepared with digital dictation and possible smart phrase technology. Any transcriptional errors that result from this process are unintentional.   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  Started on Zpac by PCP, got worse  and developed toxic metabolic encephalopathy and was transferred to [facility]. She underwent CT scan abdomen/pelvis 4/14 showing 3 areas of potential abscesses, ?Vaginal dehiscence, ?P SBO and Gen surgery/IR consulted. Perc drain placed by IR 4/15 yielding clear fluid with ng as yet.  Given she seemed a little more distended, NG tube placed 4/16 with ~750 cc out bilious material Rpt Ct performed 4/16 showed a persistent fluid collection over over sigmoid colon 3.1 x 5.2 without extravasation suggestive of a leak +m ileus and Cystogram showed no leak of contrast IR was re-consulted to evalaute fluid collection which was not possible.  Because of persisting symptoms with white count and abd pain GI was consulted who thought this could have been a perforated and then healed diverticulum and recommended Urology being made aware.   She has done fairly well over past 2 days with NGT and put out about 750 cc 4/16-4/17 from NGT and subesequently NG tube was d/c altogether 4/19 as output had decreased   Past medical history-As per Problem list Chart reviewed as below-   Consultants:  Gen surgery  IR   Procedures:  CT scan ab  PERC drain  4/15  Antibiotics:  Vanc 4/13  Cefepime 4/13  Flagyl 4/13 l  Subjective   Well.  tol po liquids fairly well no othe rissues Stool + flatus + No  ever or chills or other concerns. No n/v Needed Oxygen overnight     Objective    Interim History:  Nursing reports some confusion  Telemetry: Sinus tach 120's   Objective: Filed Vitals:   02/03/14 0547 02/03/14 0552 02/03/14 0819 02/03/14 1330  BP: 163/61 149/61  133/49  Pulse: 111 105  112  Temp:  98.7 F (37.1 C)  98.6 F (37 C)  TempSrc:  Oral  Oral  Resp:  20  20  Height:      Weight:      SpO2:  94% 94% 96%    Intake/Output Summary (Last 24 hours) at 02/03/14 1350 Last data filed at 02/03/14 1200  Gross per 24 hour  Intake    740 ml  Output    110 ml  Net    630 ml    Exam:  General:  EOMI, NCAT Cardiovascular: s1 s2 no m/r/g-tachy Respiratory: clear, no added sound Abdomen:  Soft, less distended than prior with no rebound/guarding Skin no Le edema Neuro intact   Data Reviewed: Basic Metabolic Panel:  Recent Labs Lab 01/29/14 0305 01/30/14 0317 01/31/14 0106 02/01/14 0420 02/03/14 0435  NA 138 138 137 142 141  K 3.3* 3.5* 3.4* 3.1* 2.6*  CL 103 100 102 104 101  CO2 22 22 23 25 27   GLUCOSE 127* 116* 106* 100* 105*  BUN 10 10 11 10 8   CREATININE 0.96 1.06 0.93 0.86 0.78  CALCIUM 7.5* 8.2* 8.2* 8.0* 8.0*  MG  --   --   --   --  1.9   Liver Function Tests:  Recent Labs Lab 01/30/14 0317 01/31/14 0106 02/01/14 0420 02/03/14 0435  AST 19 14 24 25   ALT 10 9 9 12   ALKPHOS 128* 92 86 76  BILITOT 0.5 0.4 0.3 0.3  PROT 6.4 5.8* 5.7* 5.9*  ALBUMIN 2.6* 2.3* 2.3* 2.3*   No results found for this basename: LIPASE, AMYLASE,  in the last 168 hours No results found for this basename: AMMONIA,  in the last 168 hours CBC:  Recent Labs Lab 01/29/14 0305 01/30/14 0317 01/31/14 0106 02/01/14 0420 02/03/14 0435  WBC 28.1* 32.5* 24.0* 19.1* 18.8*  NEUTROABS  --   --  20.7* 15.7* 14.2*  HGB 9.6* 10.3* 8.9* 8.8* 9.7*  HCT 29.0* 32.3* 27.9* 26.3* 29.5*  MCV 64.3* 63.8* 63.6* 61.7* 62.0*  PLT 356 426* 399 421* 450*   Cardiac Enzymes: No  results found for this basename: CKTOTAL, CKMB, CKMBINDEX, TROPONINI,  in the last 168 hours BNP: No components found with this basename: POCBNP,  CBG: No results found for this basename: GLUCAP,  in the last 168 hours  Recent Results (from the past 240 hour(s))  MRSA PCR SCREENING     Status: None   Collection Time    01/27/14  8:56 PM      Result Value Ref Range Status   MRSA by PCR NEGATIVE  NEGATIVE Final   Comment:            The GeneXpert MRSA Assay (FDA     approved for NASAL specimens     only), is one component of a     comprehensive MRSA colonization     surveillance program. It is not     intended to diagnose MRSA     infection nor to guide or     monitor treatment for     MRSA infections.  CULTURE, BLOOD (ROUTINE X 2)     Status: None   Collection Time    01/27/14 10:39 PM      Result Value Ref Range Status   Specimen Description BLOOD BLOOD RIGHT FOREARM   Final   Special Requests BOTTLES DRAWN AEROBIC ONLY 8CC   Final   Culture  Setup Time     Final   Value: 01/28/2014 03:56     Performed at [facility]   Culture     Final   Value: NO GROWTH 5 DAYS     Performed at [facility]   Report Status 02/03/2014 FINAL   Final  CULTURE, BLOOD (ROUTINE X 2)     Status: None   Collection Time    01/27/14 10:47 PM      Result Value Ref Range Status   Specimen Description BLOOD LEFT ANTECUBITAL   Final   Special Requests BOTTLES DRAWN AEROBIC AND ANAEROBIC 10CC   Final   Culture  Setup Time     Final   Value: 01/28/2014 03:56     Performed at [facility]   Culture     Final   Value: NO GROWTH 5 DAYS     Performed at [facility]   Report Status 02/03/2014 FINAL   Final  URINE CULTURE     Status: None   Collection Time    01/27/14 10:53 PM      Result Value Ref Range Status   Specimen Description URINE, CLEAN CATCH   Final   Special Requests NONE   Final   Culture  Setup Time  Final   Value: 01/28/2014 05:28     Performed at  [place]     Final   Value: NO GROWTH     Performed at [facility]   Culture     Final   Value: NO GROWTH     Performed at [facility]   Report Status 01/29/2014 FINAL   Final  CLOSTRIDIUM DIFFICILE BY PCR     Status: None   Collection Time    01/28/14  4:36 AM      Result Value Ref Range Status   C difficile by pcr NEGATIVE  NEGATIVE Final   Comment: Performed at [facility]  BODY FLUID CULTURE     Status: None   Collection Time    01/29/14 12:43 PM      Result Value Ref Range Status   Specimen Description FLUID PELVIC   Final   Special Requests NONE   Final   Gram Stain     Final   Value: FEW WBC PRESENT,BOTH PMN AND MONONUCLEAR     NO ORGANISMS SEEN     Performed at [facility]   Culture     Final   Value: NO GROWTH 3 DAYS     Performed at [facility]   Report Status 02/01/2014 FINAL   Final     Studies:              All Imaging reviewed and is as per above notation   Scheduled Meds: . albuterol  2.5 mg Nebulization TID  . amLODipine  5 mg Oral Daily  . enoxaparin (LOVENOX) injection  40 mg Subcutaneous Q24H  . famotidine (PEPCID) IV  20 mg Intravenous Q12H  . levothyroxine  37.5 mcg Intravenous Daily  . [START ON 02/04/2014] levothyroxine  75 mcg Oral QAC breakfast  . metoprolol  2.5 mg Intravenous 4 times per day  . metoprolol  200 mg Oral Daily  . potassium chloride  40 mEq Oral BID WC   Continuous Infusions: . sodium chloride 10 mL/hr at 01/29/14 2100     Assessment/Plan:  1. Sepsis-likely multifactorial 2/2 to multiple abcesses + hcap/uti  Urine culture from [place]=K Pneumonia-current antibiotics would cover this.  Abscess drain culture neg so far.  Unclear if this is a physiologic fluid collection [dehiscence of hysterectomy?]-could be multiple processes as has increase in WBC 20  ???32 ??? 24  ???19  ??? 18.8  On 4/20,-  Further drainage  Not possible per IR--??healed diverticular  abcesses.  Dc all abx 2. SBO-resolved.  NGT is out.  Monitor for now 3. Sinus tachycardia-new finding this admission-likely related to #1.  Monitor.  cotninue meds as per home regimen. 4. Hypothyroid-TSH 7.37, T4 0.74 -restart synthroid 75 daily 5. H/o Thalassemia 6. Hyperglycemia-A1c 6.0.  Monitor sugars -ranges 100's 7. Cervical spondylosis s.p ACDF 04/2013 8. Gerd-Continue pantoprazole 40 orally 9. HRT-hold Premarin 0.45 daily for now.  Code Status: Full Family Communication: d/w son at bedside Disposition Plan:  Tele-possible home 1-2 days with home health   Verneita Griffes, MD  [facility] Pager [phone] 02/03/2014, 1:50 PM    LOS: 7 days

## 2014-02-03 NOTE — Progress Notes (Signed)
Subjective: Pt feeling ok. Tolerating advanced diet.   Objective: Physical Exam: BP 133/49  Pulse 112  Temp(Src) 98.6 F (37 C) (Oral)  Resp 20  Ht 5\' 2"  (1.575 m)  Wt 188 lb 4.4 oz (85.4 kg)  BMI 34.43 kg/m2  SpO2 96% Minimal output from drain past 48 hrs, serous. Drain intact, NT     Labs: CBC  Recent Labs  02/01/14 0420 02/03/14 0435  WBC 19.1* 18.8*  HGB 8.8* 9.7*  HCT 26.3* 29.5*  PLT 421* 450*   BMET  Recent Labs  02/01/14 0420 02/03/14 0435  NA 142 141  K 3.1* 2.6*  CL 104 101  CO2 25 27  GLUCOSE 100* 105*  BUN 10 8  CREATININE 0.86 0.78  CALCIUM 8.0* 8.0*   LFT  Recent Labs  02/03/14 0435  PROT 5.9*  ALBUMIN 2.3*  AST 25  ALT 12  ALKPHOS 76  BILITOT 0.3   PT/INR No results found for this basename: LABPROT, INR,  in the last 72 hours   Studies/Results: No results found.  Assessment/Plan: Pelvic abscess/fluid collection s/p perc drain placement 4/15 Minimal output, labs neg [facility]CS requests drain removal. Drain removed at bedside without difficulty, dry dressing applied. Dry dressing daily/prn.    LOS: 7 days    Ascencion Dike PA-C 02/03/2014 1:57 PM

## 2014-02-03 NOTE — Progress Notes (Signed)
Seen, agree with above.  Pt continues to clinically improve.  Will sign off.

## 2014-02-03 NOTE — Progress Notes (Signed)
[facility] Progress Note  Subjective:  Wants to go home; feels like she could rest and sleep better at home.  No significant abdominal pain; says that it feels sore as if it is muscle pain from coughing.  Objective:  Vital signs in last 24 hours: Temp:  [97.7 F (36.5 C)-98.7 F (37.1 C)] 98.7 F (37.1 C) (04/20 0552) Pulse Rate:  [91-111] 105 (04/20 0552) Resp:  [20] 20 (04/20 0552) BP: (138-163)/(54-61) 149/61 mmHg (04/20 0552) SpO2:  [90 %-96 %] 94 % (04/20 0819) Last BM Date: 02/03/14 General:  Alert, Well-developed, in NAD Heart:  Sinus tach; no murmurs Pulm:  Wheezing heard B/L  Abdomen:  Soft, mildly distended. Normal bowel sounds.  Mild diffuse TTP. Extremities:  Without edema. Neurologic:  Alert and  oriented x4;  grossly normal neurologically. Psych:  Alert and cooperative. Normal mood and affect.  Intake/Output from previous day: 04/19 0701 - 04/20 0700 In: 800 [P.O.:480; IV Piggyback:300] Out: 371 [Urine:350; Drains:20; Stool:1]  Lab Results:  Recent Labs  02/01/14 0420 02/03/14 0435  WBC 19.1* 18.8*  HGB 8.8* 9.7*  HCT 26.3* 29.5*  PLT 421* 450*   BMET  Recent Labs  02/01/14 0420 02/03/14 0435  NA 142 141  K 3.1* 2.6*  CL 104 101  CO2 25 27  GLUCOSE 100* 105*  BUN 10 8  CREATININE 0.86 0.78  CALCIUM 8.0* 8.0*   LFT  Recent Labs  02/03/14 0435  PROT 5.9*  ALBUMIN 2.3*  AST 25  ALT 12  ALKPHOS 76  BILITOT 0.3   Assessment / Plan: 1) Ileus -Clinically she is improving. She is passing some flatus and stool, and her NG tube was discontinued.  Tolerating clear liquids. 2) Pelvic fluid collections s/p right transgluteal drain 01/29/14 (culture with no growth and only few WBC present); doubt intestinal related.  -Continue with supportive care.  Will sign off from GI standpoint.    LOS: 7 days   Laban Emperor. Zehr  02/03/2014, 8:23 AM  Pager number [phone]   GI ATTENDING  Interval history the reviewed. Patient personally  seen and examined. Family in room. Agree with H&P as outlined above. From a GI standpoint, she is stable. NG out. Passing gas. Tolerating diet. No further recommendations. Apparently anticipating discharge tomorrow. Management of abscess process per surgical service. Will sign off.  Docia Chuck. Geri Seminole., M.D. [facility] Division of Gastroenterology

## 2014-02-03 NOTE — Progress Notes (Signed)
CRITICAL VALUE ALERT  Critical value received:  K  2.6  Date of notification:  02/03/2014   Time of notification:  5:26 Am  Critical value read back:yes  Nurse who received alert:  Sharyon Medicus RN  MD notified (1st page):  Rogue Bussing NP   Time of first page:  5:28 AM  MD notified (2nd page):  Time of second page:  Responding MD:  Rogue Bussing NP  Time MD responded:  5:35 AM

## 2014-02-03 NOTE — Progress Notes (Signed)
Patient ID: Laura Frey, female   DOB: 1932/05/06, 80 y.o.   MRN: [id number]    Subjective: Pt desperate to go home.  Tolerating clears with no nausea or vomiting  Objective: Vital signs in last 24 hours: Temp:  [97.7 F (36.5 C)-98.7 F (37.1 C)] 98.7 F (37.1 C) (04/20 0552) Pulse Rate:  [91-111] 105 (04/20 0552) Resp:  [20] 20 (04/20 0552) BP: (138-163)/(54-61) 149/61 mmHg (04/20 0552) SpO2:  [90 %-96 %] 94 % (04/20 0819) Last BM Date: 02/03/14  Intake/Output from previous day: 04/19 0701 - 04/20 0700 In: 800 [P.O.:480; IV Piggyback:300] Out: 371 [Urine:350; Drains:20; Stool:1] Intake/Output this shift: Total I/O In: 240 [P.O.:240] Out: -   PE: Abd: soft, NT, mild distention, drain with clear minimal output, +BS  Lab Results:   Recent Labs  02/01/14 0420 02/03/14 0435  WBC 19.1* 18.8*  HGB 8.8* 9.7*  HCT 26.3* 29.5*  PLT 421* 450*   BMET  Recent Labs  02/01/14 0420 02/03/14 0435  NA 142 141  K 3.1* 2.6*  CL 104 101  CO2 25 27  GLUCOSE 100* 105*  BUN 10 8  CREATININE 0.86 0.78  CALCIUM 8.0* 8.0*   PT/INR No results found for this basename: LABPROT, INR,  in the last 72 hours CMP     Component Value Date/Time   NA 141 02/03/2014 0435   K 2.6* 02/03/2014 0435   CL 101 02/03/2014 0435   CO2 27 02/03/2014 0435   GLUCOSE 105* 02/03/2014 0435   BUN 8 02/03/2014 0435   CREATININE 0.78 02/03/2014 0435   CALCIUM 8.0* 02/03/2014 0435   PROT 5.9* 02/03/2014 0435   ALBUMIN 2.3* 02/03/2014 0435   AST 25 02/03/2014 0435   ALT 12 02/03/2014 0435   ALKPHOS 76 02/03/2014 0435   BILITOT 0.3 02/03/2014 0435   GFRNONAA 77* 02/03/2014 0435   GFRAA 90* 02/03/2014 0435   Lipase  No results found for this basename: lipase       Studies/Results: No results found.  Anti-infectives: Anti-infectives   Start     Dose/Rate Route Frequency Ordered Stop   02/01/14 1800  ceFEPIme (MAXIPIME) 1 g in dextrose 5 % 50 mL IVPB  Status:  Discontinued     1 g 100 mL/hr over 30  Minutes Intravenous Every 8 hours 02/01/14 1258 02/02/14 1333   01/31/14 1000  vancomycin (VANCOCIN) IVPB 1000 mg/200 mL premix  Status:  Discontinued     1,000 mg 200 mL/hr over 60 Minutes Intravenous Every 12 hours 01/31/14 0228 02/02/14 1333   01/31/14 0600  ceFEPIme (MAXIPIME) 2 g in dextrose 5 % 50 mL IVPB  Status:  Discontinued     2 g 100 mL/hr over 30 Minutes Intravenous Every 24 hours 01/30/14 1059 02/01/14 1258   01/28/14 1800  metroNIDAZOLE (FLAGYL) IVPB 500 mg  Status:  Discontinued     500 mg 100 mL/hr over 60 Minutes Intravenous Every 8 hours 01/28/14 1725 02/02/14 1333   01/28/14 1200  vancomycin (VANCOCIN) IVPB 750 mg/150 ml premix  Status:  Discontinued     750 mg 150 mL/hr over 60 Minutes Intravenous Every 12 hours 01/27/14 2158 01/31/14 0228   01/28/14 0200  vancomycin (VANCOCIN) IVPB 750 mg/150 ml premix     750 mg 150 mL/hr over 60 Minutes Intravenous  Once 01/27/14 2158 01/28/14 0336   01/27/14 2200  ceFEPIme (MAXIPIME) 1 g in dextrose 5 % 50 mL IVPB  Status:  Discontinued     1 g 100 mL/hr  over 30 Minutes Intravenous 3 times per day 01/27/14 2127 01/30/14 1058       Assessment/Plan  1. Leukocytosis, improving 2. Pelvic fluid collections, sterile, s/p perc drain on 4/15, cx negative 3. ? enteritis 4. PNA  Plan:  1. Will plan to adv diet to full liquids 2. Can likely try to convert abx to oral from IV 3. Will contact IR today and see if they can remove her perc drain. 4. No surgical plans at this time.  LOS: 7 days    Henreitta Cea 02/03/2014, 10:36 AM Pager: [phone]

## 2014-02-04 ENCOUNTER — Emergency Department: Payer: Self-pay | Admitting: Emergency Medicine

## 2014-02-04 LAB — CK TOTAL AND CKMB (NOT AT ARMC)
CK, TOTAL: 206 U/L — AB
CK-MB: 10.4 ng/mL — ABNORMAL HIGH (ref 0.5–3.6)

## 2014-02-04 LAB — COMPREHENSIVE METABOLIC PANEL
ALBUMIN: 2.4 g/dL — AB (ref 3.4–5.0)
ALT: 18 U/L (ref 12–78)
AST: 27 U/L (ref 15–37)
Alkaline Phosphatase: 79 U/L
Anion Gap: 5 — ABNORMAL LOW (ref 7–16)
BUN: 4 mg/dL — ABNORMAL LOW (ref 7–18)
Bilirubin,Total: 0.3 mg/dL (ref 0.2–1.0)
CALCIUM: 7.7 mg/dL — AB (ref 8.5–10.1)
CREATININE: 0.78 mg/dL (ref 0.60–1.30)
Chloride: 105 mmol/L (ref 98–107)
Co2: 30 mmol/L (ref 21–32)
EGFR (African American): >60
GLUCOSE: 95 mg/dL (ref 65–99)
OSMOLALITY: 276 (ref 275–301)
Potassium: 3.2 mmol/L — ABNORMAL LOW (ref 3.5–5.1)
SODIUM: 140 mmol/L (ref 136–145)
TOTAL PROTEIN: 6.2 g/dL — AB (ref 6.4–8.2)

## 2014-02-04 LAB — CBC
HCT: 30.4 % — AB (ref 35.0–47.0)
HCT: 29.1 % — ABNORMAL LOW (ref 36.0–46.0)
HGB: 9.4 g/dL — ABNORMAL LOW (ref 12.0–16.0)
Hemoglobin: 9.6 g/dL — ABNORMAL LOW (ref 12.0–15.0)
MCH: 19.7 pg — AB (ref 26.0–34.0)
MCH: 20.6 pg — AB (ref 26.0–34.0)
MCHC: 30.8 g/dL — ABNORMAL LOW (ref 32.0–36.0)
MCHC: 33 g/dL (ref 30.0–36.0)
MCV: 64 fL — ABNORMAL LOW (ref 80–100)
MCV: 62.3 fL — ABNORMAL LOW (ref 78.0–100.0)
PLATELETS: 414 10*3/uL (ref 150–440)
PLATELETS: 428 10*3/uL — AB (ref 150–400)
RBC: 4.75 10*6/uL (ref 3.80–5.20)
RBC: 4.67 MIL/uL (ref 3.87–5.11)
RDW: 15.7 % — AB (ref 11.5–14.5)
RDW: 15.2 % (ref 11.5–15.5)
WBC: 15.5 10*3/uL — ABNORMAL HIGH (ref 3.6–11.0)
WBC: 17.8 10*3/uL — ABNORMAL HIGH (ref 4.0–10.5)

## 2014-02-04 LAB — BASIC METABOLIC PANEL
BUN: 5 mg/dL — ABNORMAL LOW (ref 6–23)
CALCIUM: 8.1 mg/dL — AB (ref 8.4–10.5)
CO2: 30 mEq/L (ref 19–32)
CREATININE: 0.77 mg/dL (ref 0.50–1.10)
Chloride: 101 mEq/L (ref 96–112)
GFR, EST NON AFRICAN AMERICAN: 78 mL/min — AB (ref >90)
Glucose, Bld: 108 mg/dL — ABNORMAL HIGH (ref 70–99)
Potassium: 3.7 mEq/L (ref 3.7–5.3)
Sodium: 143 mEq/L (ref 137–147)

## 2014-02-04 LAB — TROPONIN I: Troponin-I: <0.02 ng/mL

## 2014-02-04 MED ORDER — POTASSIUM CHLORIDE CRYS ER 20 MEQ PO TBCR: 40.0000 meq | EXTENDED_RELEASE_TABLET | Freq: Two times a day (BID) | ORAL | Status: DC

## 2014-02-04 MED ORDER — AMLODIPINE BESYLATE 5 MG PO TABS: 10.0000 mg | ORAL_TABLET | Freq: Every day | ORAL | Status: DC

## 2014-02-04 NOTE — Progress Notes (Signed)
Patient pulled IV out, and request IV to be left out for now. Stated she may be d/c'd today. Explained to patient IV may need to be restarted today if not d/c'd. Acknowledged understanding.SRP, RN

## 2014-02-04 NOTE — Progress Notes (Signed)
Patients oxygen saturation on room air was 100% before ambulation. Patient was not short of breath. Patient ambulated in hallway >50 feet without shortness of breath. Patients oxygen saturation was 100% on room air. Patient tolerated well. MD notified of findings. Will discharge at this time. Pearline Cables Setzer

## 2014-02-04 NOTE — Discharge Summary (Addendum)
Physician Discharge Summary  Laura Frey [id number] DOB: [birth year] DOA: 01/27/2014  PCP: No PCP Per Patient  Admit date: 01/27/2014 Discharge date: 02/04/2014  Time spent: 45 minutes  Recommendations for Outpatient Follow-up:  1. Blood pressure medications have changed-see below changes mainly because of renal insufficiency 2. Obtain complete metabolic panel and CBC in a week 3. Consider weaning oxygen at next office check.  4. Recheck A1c and lipid panel as per protocol 5. May benefit from colonoscopy is thought that this is reasonable per primary care physician as outpatient 6.  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 Started on Zpac by PCP, got worse and developed toxic metabolic encephalopathy and was transferred to [facility].  She underwent CT scan abdomen/pelvis 4/14 showing 3 areas of potential abscesses, ?Vaginal dehiscence, ?P SBO and Gen surgery/IR consulted.  Perc drain placed by IR 4/15 yielding clear fluid with ng as yet.  Given she seemed a little more distended, NG tube placed 4/16 with ~750 cc out bilious material  Rpt Ct  performed 4/16 showed a persistent fluid collection over over sigmoid colon 3.1 x 5.2 without extravasation suggestive of a leak +m ileus and Cystogram showed no leak of contrast  IR was re-consulted to evalaute fluid collection which was not possible. Because of persisting symptoms with white count and abd pain GI was consulted who thought this could have been a perforated and then healed diverticulum and recommended Urology being made aware.  She has done fairly well over past 2 days with NGT and put out about 750 cc 4/16-4/17 from NGT and subesequently NG tube was d/c altogether 4/19 as output had decreased See below for full hospital course   Hospital Course:   1. Sepsis-likely multifactorial 2/2 to multiple abcesses + hcap/uti Urine culture from [place]=K Pneumonia-current antibiotics would cover this. She ultimately had and abscess drain placed by interventional radiology which was tested for urine, urology was consulted and this was not thought to be either an enteric or vesicular fistula . Abscess drain culture neg. Unclear if this is a physiologic fluid collectioncould be multiple processes as has increase in WBC 20 ???32 ??? 24 ???19 ??? 18.8 On 4/20,- Further drainage Not possible per IR--??healed diverticular abcesses. All antibiotics were subsequently discontinued and patient defervesced without any fever over 48 hours. It was thought that she might have had an SBO with a complicated picture cause leukocytosis ultimately because of sepsis was not completely clear. She was encouraged to come back to the emergency room and seen medical attention if she had any further fevers nausea vomiting 2. SBO-resolved. NGT placed 4/16 but was removed 4/18 patient was transitioned to a clear diet and then full she  tolerated this change well.  3.  hyponatremia/hypokalemia -change thiazide diuretic as well as losartan to amlodipine at a higher dose as an outpatient . Recheck potassium as outpatient  4. Sinus  tachycardia-new finding this admission-likely related to #1.  the stabilizer hospital stay  5. Hypothyroid-TSH 7.37, T4 0.74 -restart synthroid 75 daily when she was taking by mouth 6. H/o Thalassemia 7. Hyperglycemia-A1c 6.0. Monitor sugars -ranges 100's-would recheck as an outpatient 8. Cervical spondylosis s.p ACDF 04/2013 9. Gerd-Continue pantoprazole 40 orally 10. HRT-held Premarin 0.45 daily during hospital stay but this was re\re continued on discharge    Consultants:  Gen surgery  IR Gastroenterology Procedures:  CT scan ab  PERC drain 4/15' NG tube placed 4/16-removed 4/18 Antibiotics:  Vanc 4/13-4/19 Cefepime 4/13-4/19  Flagyl 4/13-4/19   Discharge Exam: Filed Vitals:   02/04/14 0500  BP: 154/72  Pulse: 94  Temp: 98.4 F (36.9 C)  Resp: 20    General:  alert pleasant and oriented  no apparent distress  Cardiovascular:  S1-S2 no murmur rub or gallop  Respiratory:  clinically clear   Discharge Instructions You were cared for by a hospitalist during your hospital stay. If you have any questions about your discharge medications or the care you received while you were in the hospital after you are discharged, you can call the unit and asked to speak with the hospitalist on call if the hospitalist that took care of you is not available. Once you are discharged, your primary care physician will handle any further medical issues. Please note that NO REFILLS for any discharge medications will be authorized once you are discharged, as it is imperative that you return to your primary care physician (or establish a relationship with a primary care physician if you do not have one) for your aftercare needs so that they can reassess your need for medications and monitor your lab values.  Discharge Orders   Future Orders Complete By Expires   Diet - low sodium heart healthy  As directed    Discharge instructions  As directed    Increase activity slowly  As directed         Medication List    STOP taking these medications       BC FAST PAIN RELIEF ARTHRITIS PO     valsartan-hydrochlorothiazide 320-12.5 MG per tablet  Commonly known as:  DIOVAN-HCT      TAKE these medications       albuterol 108 (90 BASE) MCG/ACT inhaler  Commonly known as:  PROVENTIL HFA;VENTOLIN HFA  Inhale 2 puffs into the lungs every 6 (six) hours as needed for wheezing.     amLODipine 5 MG tablet  Commonly known as:  NORVASC  Take 2 tablets (10 mg total) by mouth daily.     estrogens (conjugated) 0.45 MG tablet  Commonly known as:  PREMARIN  Take 0.45 mg by mouth daily.     fexofenadine 180 MG tablet  Commonly known as:  ALLEGRA  Take 180 mg by mouth daily.     guaifenesin 100 MG/5ML syrup  Commonly known as:  ROBITUSSIN  Take 200 mg by mouth 3 (three) times daily as needed for cough.     levothyroxine 75 MCG tablet  Commonly known as:  SYNTHROID, LEVOTHROID  Take 75 mcg by mouth daily before breakfast.     magnesium oxide 400 MG tablet  Commonly known as:  MAG-OX  Take 400 mg by mouth daily.     metoprolol 200 MG 24 hr  tablet  Commonly known as:  TOPROL-XL  Take 200 mg by mouth daily.     multivitamin with minerals Tabs tablet  Take 1 tablet by mouth daily.     omeprazole 20 MG capsule  Commonly known as:  PRILOSEC  Take 20 mg by mouth daily.     potassium chloride SA 20 MEQ tablet  Commonly known as:  K-DUR,KLOR-CON  Take 2 tablets (40 mEq total) by mouth 2 (two) times daily with a meal.     solifenacin 5 MG tablet  Commonly known as:  VESICARE  Take 5 mg by mouth daily.     vitamin C 500 MG tablet  Commonly known as:  ASCORBIC ACID  Take 500 mg by mouth daily.     Vitamin D 2000 UNITS tablet  Take 2,000 Units by mouth daily.     VITAMIN E PO  Take 500 Units by mouth daily.       Allergies  Allergen Reactions  . Hydrocodone     hallucinations  . Oxycodone     hallucinations  . Zoloft [Sertraline Hcl]     Burns stomach  .  Ciprofloxacin Swelling  . Sulfa Antibiotics     Blisters in mouth  . Tylenol [Acetaminophen]     headaches  . Ultram [Tramadol]     headaches      The results of significant diagnostics from this hospitalization (including imaging, microbiology, ancillary and laboratory) are listed below for reference.    Significant Diagnostic Studies: Dg Chest 2 View  01/28/2014   CLINICAL DATA:  Shortness of breath and cough.  History of melanoma.  EXAM: CHEST  2 VIEW  COMPARISON:  01/27/2014 and 04/29/2013  FINDINGS: Lungs are adequately inflated and demonstrate mild worsening consolidation over the right central mid to lower lung there is a small amount of pleural fluid posteriorly in the lung bases. There is suggestion mild vascular congestion. Cardiomediastinal silhouette and remainder of the exam is unchanged.  IMPRESSION: Worsening opacification of the central right mid to lower lung likely infection. Small amount of pleural fluid posteriorly. Suggestion mild vascular congestion.   Electronically Signed   By: Marin Olp M.D.   On: 01/28/2014 11:51   Ct Pelvis Wo Contrast  01/30/2014   CLINICAL DATA:  Pelvic abscess of unclear etiology. Rectal contrast given to evaluate for leak/fistula.  EXAM: CT PELVIS WITHOUT CONTRAST  TECHNIQUE: Multidetector CT imaging of the pelvis was performed following the standard protocol without intravenous contrast. Rectal contrast was administered via rectal catheter. Patient had difficulty keeping balloon catheter in the rectum.  COMPARISON:  01/28/2014  FINDINGS: Foley catheter is present within a collapsed bladder. Rectal catheter is in place with contrast filling the rectosigmoid colon and remainder of the visualized colon. There is no evidence of contrast extravasation/leak. B fluid collection just lateral and inferior to the sigmoid colon in the left pelvis is unchanged to slightly larger measuring 3 x 5.2 cm with less air within the fluid collection. There is a right  gluteal pigtail drainage catheter with pigtail over the right pelvis as there has been resolution of the deep pelvic fluid collections in the region of the vaginal cuff and right perirectal region. There is a continued fluid collection with a few small air-fluid levels over the right upper pelvis without significant change as this measures 4.3 x 4.9 cm. The pigtail drainage catheter is immediately posterior and slightly inferior to this persistent fluid collection. No evidence of free air. There are persistent air-filled mildly  dilated small bowel loops likely secondary ileus to this inflammatory/infectious process. There is moderate diverticulosis of the colon. Remainder the exam is unchanged.  IMPRESSION: Right gluteal pigtail drainage catheter in place with pigtail just right of midline and interval resolution of the fluid collections over the right perirectal region and vaginal cuff. Persistent fluid collection over the left pelvis inferior lateral to the adjacent sigmoid colon with slight increase in size measuring 3.1 x 5.2 cm. Stable fluid collection with a few small air-fluid levels over the right upper pelvis. The adjacent rectosigmoid colon is well opacified with contrast as there is no evidence of contrast extravasation/ leak or fistula.  Continued persistent dilated fluid and air-filled small bowel loops likely secondary ileus to the pelvic infectious/inflammatory process.  Diverticulosis of the colon.   Electronically Signed   By: Marin Olp M.D.   On: 01/30/2014 17:11   Ct Abdomen Pelvis W Contrast  01/28/2014   CLINICAL DATA:  Sepsis, abdominal pain leukocytosis  EXAM: CT ABDOMEN AND PELVIS WITH CONTRAST  TECHNIQUE: Multidetector CT imaging of the abdomen and pelvis was performed using the standard protocol following bolus administration of intravenous contrast.  CONTRAST:  153mL OMNIPAQUE IOHEXOL 300 MG/ML  SOLN  COMPARISON:  Not available  FINDINGS: Bilateral pleural effusions and mild basilar  atelectasis. No pericardial fluid.  No focal hepatic lesion. The gallbladder, pancreas, spleen, adrenal glands, kidneys are normal.  The stomach is normal. There is small hiatal hernia. The proximal small bowel is mildly dilated to 3 cm. There is poor progression of the oral contrast. The more distal small bowel leading to the terminal ileum is decompressed compared to the proximal small bowel measuring approximately 1 cm diameter. No cleartransition point is identified.  Within the deep pelvisthere are 2 adjacent ovoid fluid collections with rim enhancement positioned posterior to the bladder and anterior to the rectum measuring 3.4 x 2.2 cm and 2.4 x 2.6 cm. These are felt to represent small abscess rather than loops of bowel or ovaries. Posterior within the deep pelvis a second fluid collection measuring 3.5 x 3.3 cm in the right aspect the posterior cul-de-sac.  There is small amount of gas along the left broad ligament (image 58). Second focus of gas in the region of the right adnexa/broad ligament. (image 60). There several diverticula of the sigmoid colon pelvis appears relatively normal.  Abdominal or is normal caliber. No retroperitoneal periportal lymphadenopathy.  Bladder appears normal. No evidence of gas bladder. Post hysterectomy anatomy. The rectum appears normal.  There is no free air in the upper abdomen. Tiny focus of gas in the umbilicus.  IMPRESSION: 1. Inflammatory process in the pelvis. There are 3 fluid collections with enhancing rims which appear to represent abscesses located between the rectum and the bladder. These could could relate to prior hysterectomy and potential dehiscence of vaginal cuff. Diverticulitis would be a secondary less likely consideration. 2. Small amount of gas and fluid collections in left and right broad ligament / adnexal regions. These are also likely infections associated with same process. 3. Evidence of partial small bowel obstruction versus ileus. This likely  relates to secondary inflammation of the small bowel as it dips into the pelvis. 4. Bilateral pleural effusions and basilar atelectasis. Findings conveyed topatient's ICU nurseon 01/28/2014  at16:31.   Electronically Signed   By: Suzy Bouchard M.D.   On: 01/28/2014 16:34   Dg Cystogram  01/30/2014   CLINICAL DATA:  Pelvic abscesses.  EXAM: CYSTOGRAM  TECHNIQUE: After catheterization of  the urinary bladder following sterile technique the bladder was filled with 300 mL Cysto-Hypaque 30% by drip infusion. Serial spot images were obtained during bladder filling and post draining.  FLUOROSCOPY TIME:  2 min 9 seconds slow pulsed fluoroscopy adjusted for body habitus  COMPARISON:  CT scans dated 04/14 and 01/30/2014  FINDINGS: Contrast was instilled through the indwelling Foley catheter. Contrast is seen in the colon from the previous CT scan. I placed the patient in the lateral projection and filled the bladder. Multiple radiographs were then obtained in multiple projections which demonstrate no evidence of extravasation of contrast. No visible fistula to the bowel.  Abscess drainage catheter is noted. Post drainage radiograph demonstrates no contrast in the abscess drainage catheter.  IMPRESSION: Normal cystogram.   Electronically Signed   By: Rozetta Nunnery M.D.   On: 01/30/2014 17:45   Dg Chest Port 1 View  01/29/2014   CLINICAL DATA:  Shortness of breath and weakness.  EXAM: PORTABLE CHEST - 1 VIEW  COMPARISON:  01/28/2014.  FINDINGS: The cardiac silhouette, mediastinal and hilar contours are stable. Slightly lower lung volumes with vascular crowding and atelectasis. The right lower lobe process appears slightly worse but this is largely due to the lower lung volumes. No pneumothorax or pleural effusion.  IMPRESSION: Lower lung volumes with increase in vascular crowding and bibasilar atelectasis, right greater than left.   Electronically Signed   By: Kalman Jewels M.D.   On: 01/29/2014 19:21   Dg Abd 2  Views  02/01/2014   CLINICAL DATA:  Abdominal pain and distention  EXAM: ABDOMEN - 2 VIEW  COMPARISON:  January 30, 2014  FINDINGS: Supine and upright images were obtained. A drainage catheter remains in the right pelvis region. Nasogastric tube tip is in the proximal duodenum. There remains several loops of dilated small bowel. Contrast is seen in a nondilated colon. There are multiple sigmoid diverticula. No free air. There is a phlebolith in left pelvis.  IMPRESSION: Residual bowel dilatation without appreciable air-fluid levels. Suspect residual partial bowel obstruction. Contrast is seen in a nondistended colon.  Multiple colonic diverticula.  Nasogastric tube tip in proximal duodenum. Drainage catheter and right pelvis again noted. No free air.   Electronically Signed   By: Lowella Grip M.D.   On: 02/01/2014 14:23   Dg Abd Acute W/chest  01/30/2014   CLINICAL DATA:  Shortness of breath.  Diffuse abdominal pain.  EXAM: ACUTE ABDOMEN SERIES (ABDOMEN 2 VIEW & CHEST 1 VIEW)  COMPARISON:  Chest x-ray dated 01/29/2014 and CT scan dated 01/28/2014  FINDINGS: Right middle lobe atelectasis has improved. There is still slight pulmonary vascular congestion. Slight linear atelectasis at the left lung base. Tiny bilateral effusions.  No free air in the abdomen. Proximal small bowel remains dilated. Contrast has passed into the nondistended colon. Abscess drainage catheter present in the right side of the pelvis.  IMPRESSION: Persistent pattern of partial small bowel obstruction. Contrast has moved into the nondistended colon.   Electronically Signed   By: Rozetta Nunnery M.D.   On: 01/30/2014 13:30   Ct Image Guided Drainage Percut Cath  Peritoneal Retroperit  01/29/2014   CLINICAL DATA:  Pelvic abscess  EXAM: CT IMAGE GUIDED DRAINAGE PERCUT CATH  PERITONEAL RETROPERIT  FLUOROSCOPY TIME:  None minutes  MEDICATIONS AND MEDICAL HISTORY: Versed 1 mg, Fentanyl 50 mcg.  Additional Medications: None.  ANESTHESIA/SEDATION:  Moderate sedation time: 20 minutes  CONTRAST:  None  PROCEDURE: The procedure, risks, benefits, and alternatives were explained  to the patient. Questions regarding the procedure were encouraged and answered. The patient understands and consents to the procedure.  The right gluteal region in the prone position was prepped with Betadine in a sterile fashion, and a sterile drape was applied covering the operative field. A sterile gown and sterile gloves were used for the procedure.  Under CT guidance, an 18 gauge needle was inserted into the pelvic abscess via a right trans gluteal approach. It was removed over an Amplatz wire. A 12 French dilator followed by a 12 [place] drain were inserted. It was looped and string fixed in the fluid collection. Slightly cloudy yellow fluid was aspirated.  FINDINGS: Images document 90 French drain placement into a pelvic abscess via right trans gluteal approach  COMPLICATIONS: None  IMPRESSION: Successful right trans gluteal pelvic abscess drainage.   Electronically Signed   By: Maryclare Bean M.D.   On: 01/29/2014 13:33    Microbiology: Recent Results (from the past 240 hour(s))  MRSA PCR SCREENING     Status: None   Collection Time    01/27/14  8:56 PM      Result Value Ref Range Status   MRSA by PCR NEGATIVE  NEGATIVE Final   Comment:            The GeneXpert MRSA Assay (FDA     approved for NASAL specimens     only), is one component of a     comprehensive MRSA colonization     surveillance program. It is not     intended to diagnose MRSA     infection nor to guide or     monitor treatment for     MRSA infections.  CULTURE, BLOOD (ROUTINE X 2)     Status: None   Collection Time    01/27/14 10:39 PM      Result Value Ref Range Status   Specimen Description BLOOD BLOOD RIGHT FOREARM   Final   Special Requests BOTTLES DRAWN AEROBIC ONLY 8CC   Final   Culture  Setup Time     Final   Value: 01/28/2014 03:56     Performed at [facility]   Culture      Final   Value: NO GROWTH 5 DAYS     Performed at [facility]   Report Status 02/03/2014 FINAL   Final  CULTURE, BLOOD (ROUTINE X 2)     Status: None   Collection Time    01/27/14 10:47 PM      Result Value Ref Range Status   Specimen Description BLOOD LEFT ANTECUBITAL   Final   Special Requests BOTTLES DRAWN AEROBIC AND ANAEROBIC 10CC   Final   Culture  Setup Time     Final   Value: 01/28/2014 03:56     Performed at [facility]   Culture     Final   Value: NO GROWTH 5 DAYS     Performed at [facility]   Report Status 02/03/2014 FINAL   Final  URINE CULTURE     Status: None   Collection Time    01/27/14 10:53 PM      Result Value Ref Range Status   Specimen Description URINE, CLEAN CATCH   Final   Special Requests NONE   Final   Culture  Setup Time     Final   Value: 01/28/2014 05:28     Performed at [place]     Final  Value: NO GROWTH     Performed at [facility]   Culture     Final   Value: NO GROWTH     Performed at [facility]   Report Status 01/29/2014 FINAL   Final  CLOSTRIDIUM DIFFICILE BY PCR     Status: None   Collection Time    01/28/14  4:36 AM      Result Value Ref Range Status   C difficile by pcr NEGATIVE  NEGATIVE Final   Comment: Performed at [facility]  BODY FLUID CULTURE     Status: None   Collection Time    01/29/14 12:43 PM      Result Value Ref Range Status   Specimen Description FLUID PELVIC   Final   Special Requests NONE   Final   Gram Stain     Final   Value: FEW WBC PRESENT,BOTH PMN AND MONONUCLEAR     NO ORGANISMS SEEN     Performed at [facility]   Culture     Final   Value: NO GROWTH 3 DAYS     Performed at [facility]   Report Status 02/01/2014 FINAL   Final     Labs: Basic Metabolic Panel:  Recent Labs Lab 01/30/14 0317 01/31/14 0106 02/01/14 0420 02/03/14 0435 02/04/14 0500  NA 138 137 142 141 143  K 3.5* 3.4* 3.1* 2.6*  3.7  CL 100 102 104 101 101  CO2 22 23 25 27 30   GLUCOSE 116* 106* 100* 105* 108*  BUN 10 11 10 8  5*  CREATININE 1.06 0.93 0.86 0.78 0.77  CALCIUM 8.2* 8.2* 8.0* 8.0* 8.1*  MG  --   --   --  1.9  --    Liver Function Tests:  Recent Labs Lab 01/30/14 0317 01/31/14 0106 02/01/14 0420 02/03/14 0435  AST 19 14 24 25   ALT 10 9 9 12   ALKPHOS 128* 92 86 76  BILITOT 0.5 0.4 0.3 0.3  PROT 6.4 5.8* 5.7* 5.9*  ALBUMIN 2.6* 2.3* 2.3* 2.3*   No results found for this basename: LIPASE, AMYLASE,  in the last 168 hours No results found for this basename: AMMONIA,  in the last 168 hours CBC:  Recent Labs Lab 01/30/14 0317 01/31/14 0106 02/01/14 0420 02/03/14 0435 02/04/14 0418  WBC 32.5* 24.0* 19.1* 18.8* 17.8*  NEUTROABS  --  20.7* 15.7* 14.2*  --   HGB 10.3* 8.9* 8.8* 9.7* 9.6*  HCT 32.3* 27.9* 26.3* 29.5* 29.1*  MCV 63.8* 63.6* 61.7* 62.0* 62.3*  PLT 426* 399 421* 450* 428*   Cardiac Enzymes: No results found for this basename: CKTOTAL, CKMB, CKMBINDEX, TROPONINI,  in the last 168 hours BNP: BNP (last 3 results) No results found for this basename: PROBNP,  in the last 8760 hours CBG: No results found for this basename: GLUCAP,  in the last 168 hours     Signed:  Nita Sells  [facility] 02/04/2014, 8:47 AM

## 2014-02-05 ENCOUNTER — Inpatient Hospital Stay (HOSPITAL_COMMUNITY): Payer: Medicare Other

## 2014-02-05 ENCOUNTER — Other Ambulatory Visit (HOSPITAL_COMMUNITY): Payer: Medicare Other

## 2014-02-05 ENCOUNTER — Encounter (HOSPITAL_COMMUNITY): Payer: Self-pay

## 2014-02-05 ENCOUNTER — Inpatient Hospital Stay (HOSPITAL_COMMUNITY)
Admission: AD | Admit: 2014-02-05 | Discharge: 2014-02-08 | DRG: 292 | Disposition: A | Discharge status: Home Health | Payer: Medicare Other | Source: Other Acute Inpatient Hospital | Attending: Internal Medicine | Admitting: Internal Medicine

## 2014-02-05 DIAGNOSIS — K219 Gastro-esophageal reflux disease without esophagitis: Secondary | ICD-10-CM | POA: Diagnosis present

## 2014-02-05 DIAGNOSIS — E876 Hypokalemia: Secondary | ICD-10-CM | POA: Diagnosis present

## 2014-02-05 DIAGNOSIS — E039 Hypothyroidism, unspecified: Secondary | ICD-10-CM | POA: Diagnosis present

## 2014-02-05 DIAGNOSIS — I509 Heart failure, unspecified: Secondary | ICD-10-CM | POA: Diagnosis present

## 2014-02-05 DIAGNOSIS — R0602 Shortness of breath: Secondary | ICD-10-CM | POA: Diagnosis present

## 2014-02-05 DIAGNOSIS — R188 Other ascites: Secondary | ICD-10-CM | POA: Diagnosis present

## 2014-02-05 DIAGNOSIS — Z79899 Other long term (current) drug therapy: Secondary | ICD-10-CM

## 2014-02-05 DIAGNOSIS — I739 Peripheral vascular disease, unspecified: Secondary | ICD-10-CM | POA: Diagnosis present

## 2014-02-05 DIAGNOSIS — I959 Hypotension, unspecified: Secondary | ICD-10-CM

## 2014-02-05 DIAGNOSIS — I5031 Acute diastolic (congestive) heart failure: Secondary | ICD-10-CM | POA: Diagnosis present

## 2014-02-05 DIAGNOSIS — D649 Anemia, unspecified: Secondary | ICD-10-CM | POA: Diagnosis present

## 2014-02-05 DIAGNOSIS — Z882 Allergy status to sulfonamides status: Secondary | ICD-10-CM

## 2014-02-05 DIAGNOSIS — R32 Unspecified urinary incontinence: Secondary | ICD-10-CM | POA: Diagnosis present

## 2014-02-05 DIAGNOSIS — I10 Essential (primary) hypertension: Secondary | ICD-10-CM | POA: Diagnosis present

## 2014-02-05 DIAGNOSIS — R197 Diarrhea, unspecified: Secondary | ICD-10-CM | POA: Diagnosis present

## 2014-02-05 DIAGNOSIS — R933 Abnormal findings on diagnostic imaging of other parts of digestive tract: Secondary | ICD-10-CM

## 2014-02-05 DIAGNOSIS — A419 Sepsis, unspecified organism: Secondary | ICD-10-CM

## 2014-02-05 DIAGNOSIS — Z888 Allergy status to other drugs, medicaments and biological substances status: Secondary | ICD-10-CM

## 2014-02-05 DIAGNOSIS — N390 Urinary tract infection, site not specified: Secondary | ICD-10-CM

## 2014-02-05 DIAGNOSIS — R0989 Other specified symptoms and signs involving the circulatory and respiratory systems: Secondary | ICD-10-CM

## 2014-02-05 DIAGNOSIS — J45909 Unspecified asthma, uncomplicated: Secondary | ICD-10-CM | POA: Diagnosis present

## 2014-02-05 DIAGNOSIS — R0609 Other forms of dyspnea: Secondary | ICD-10-CM

## 2014-02-05 DIAGNOSIS — Z87891 Personal history of nicotine dependence: Secondary | ICD-10-CM

## 2014-02-05 LAB — COMPREHENSIVE METABOLIC PANEL
ALK PHOS: 67 U/L (ref 39–117)
ALT: 13 U/L (ref 0–35)
AST: 22 U/L (ref 0–37)
Albumin: 2.5 g/dL — ABNORMAL LOW (ref 3.5–5.2)
BUN: 4 mg/dL — ABNORMAL LOW (ref 6–23)
CO2: 29 mEq/L (ref 19–32)
Calcium: 8.2 mg/dL — ABNORMAL LOW (ref 8.4–10.5)
Chloride: 99 mEq/L (ref 96–112)
Creatinine, Ser: 0.84 mg/dL (ref 0.50–1.10)
GFR calc non Af Amer: 64 mL/min — ABNORMAL LOW (ref >90)
GFR, EST AFRICAN AMERICAN: 75 mL/min — AB (ref >90)
GLUCOSE: 112 mg/dL — AB (ref 70–99)
Potassium: 3.2 mEq/L — ABNORMAL LOW (ref 3.7–5.3)
Sodium: 139 mEq/L (ref 137–147)
Total Bilirubin: 0.4 mg/dL (ref 0.3–1.2)
Total Protein: 6 g/dL (ref 6.0–8.3)

## 2014-02-05 LAB — CBC WITH DIFFERENTIAL/PLATELET
BASOS ABS: 0.1 10*3/uL (ref 0.0–0.1)
Basophils Relative: 1 % (ref 0–1)
EOS ABS: 0.5 10*3/uL (ref 0.0–0.7)
Eosinophils Relative: 4 % (ref 0–5)
HCT: 27.2 % — ABNORMAL LOW (ref 36.0–46.0)
Hemoglobin: 9.1 g/dL — ABNORMAL LOW (ref 12.0–15.0)
LYMPHS PCT: 15 % (ref 12–46)
Lymphs Abs: 2 10*3/uL (ref 0.7–4.0)
MCH: 20.8 pg — ABNORMAL LOW (ref 26.0–34.0)
MCHC: 33.5 g/dL (ref 30.0–36.0)
MCV: 62.2 fL — ABNORMAL LOW (ref 78.0–100.0)
MONO ABS: 1.2 10*3/uL — AB (ref 0.1–1.0)
Monocytes Relative: 9 % (ref 3–12)
NEUTROS ABS: 9.8 10*3/uL — AB (ref 1.7–7.7)
Neutrophils Relative %: 71 % (ref 43–77)
Platelets: 349 10*3/uL (ref 150–400)
RBC: 4.37 MIL/uL (ref 3.87–5.11)
RDW: 15 % (ref 11.5–15.5)
WBC: 13.6 10*3/uL — ABNORMAL HIGH (ref 4.0–10.5)

## 2014-02-05 LAB — CBC
HEMATOCRIT: 29.9 % — AB (ref 36.0–46.0)
Hemoglobin: 9.5 g/dL — ABNORMAL LOW (ref 12.0–15.0)
MCH: 20.3 pg — ABNORMAL LOW (ref 26.0–34.0)
MCHC: 31.8 g/dL (ref 30.0–36.0)
MCV: 63.8 fL — ABNORMAL LOW (ref 78.0–100.0)
Platelets: 420 10*3/uL — ABNORMAL HIGH (ref 150–400)
RBC: 4.69 MIL/uL (ref 3.87–5.11)
RDW: 15.3 % (ref 11.5–15.5)
WBC: 13.9 10*3/uL — AB (ref 4.0–10.5)

## 2014-02-05 LAB — PROCALCITONIN: Procalcitonin: 0.26 ng/mL

## 2014-02-05 LAB — PRO B NATRIURETIC PEPTIDE: Pro B Natriuretic peptide (BNP): 2497 pg/mL — ABNORMAL HIGH (ref 0–450)

## 2014-02-05 LAB — BASIC METABOLIC PANEL
BUN: 5 mg/dL — ABNORMAL LOW (ref 6–23)
CALCIUM: 8 mg/dL — AB (ref 8.4–10.5)
CO2: 30 mEq/L (ref 19–32)
CREATININE: 0.87 mg/dL (ref 0.50–1.10)
Chloride: 102 mEq/L (ref 96–112)
GFR calc non Af Amer: 62 mL/min — ABNORMAL LOW (ref >90)
GFR, EST AFRICAN AMERICAN: 72 mL/min — AB (ref >90)
Glucose, Bld: 118 mg/dL — ABNORMAL HIGH (ref 70–99)
Potassium: 3.1 mEq/L — ABNORMAL LOW (ref 3.7–5.3)
Sodium: 142 mEq/L (ref 137–147)

## 2014-02-05 LAB — T4, FREE: FREE T4: 0.75 ng/dL — AB (ref 0.80–1.80)

## 2014-02-05 LAB — TROPONIN I: Troponin I: <0.3 ng/mL (ref <0.30)

## 2014-02-05 LAB — TSH: TSH: 16.79 u[IU]/mL — ABNORMAL HIGH (ref 0.350–4.500)

## 2014-02-05 LAB — D-DIMER, QUANTITATIVE (NOT AT ARMC): D DIMER QUANT: 9.34 ug{FEU}/mL — AB (ref 0.00–0.48)

## 2014-02-05 MED ORDER — IPRATROPIUM-ALBUTEROL 0.5-2.5 (3) MG/3ML IN SOLN
3.0000 mL | Freq: Four times a day (QID) | RESPIRATORY_TRACT | Status: DC
  Administered 2014-02-05 – 2014-02-07 (×8): 3 mL via RESPIRATORY_TRACT
  Filled 2014-02-05 (×9): qty 3

## 2014-02-05 MED ORDER — POTASSIUM CHLORIDE CRYS ER 20 MEQ PO TBCR
40.0000 meq | EXTENDED_RELEASE_TABLET | Freq: Two times a day (BID) | ORAL | Status: DC
  Administered 2014-02-05 – 2014-02-08 (×7): 40 meq via ORAL
  Filled 2014-02-05 (×9): qty 2

## 2014-02-05 MED ORDER — ALBUTEROL SULFATE (2.5 MG/3ML) 0.083% IN NEBU: 2.5000 mg | INHALATION_SOLUTION | RESPIRATORY_TRACT | Status: DC | PRN

## 2014-02-05 MED ORDER — DEXTROSE 5 % IV SOLN
2.0000 g | Freq: Once | INTRAVENOUS | Status: DC
  Filled 2014-02-05: qty 2

## 2014-02-05 MED ORDER — FUROSEMIDE 10 MG/ML IJ SOLN
20.0000 mg | Freq: Every day | INTRAMUSCULAR | Status: DC
  Administered 2014-02-05 – 2014-02-06 (×2): 20 mg via INTRAVENOUS
  Filled 2014-02-05 (×2): qty 2

## 2014-02-05 MED ORDER — ALBUTEROL SULFATE (2.5 MG/3ML) 0.083% IN NEBU
2.5000 mg | INHALATION_SOLUTION | RESPIRATORY_TRACT | Status: DC
  Administered 2014-02-05: 2.5 mg via RESPIRATORY_TRACT
  Filled 2014-02-05: qty 3

## 2014-02-05 MED ORDER — ONDANSETRON HCL 4 MG PO TABS: 4.0000 mg | ORAL_TABLET | Freq: Four times a day (QID) | ORAL | Status: DC | PRN

## 2014-02-05 MED ORDER — VANCOMYCIN HCL IN DEXTROSE 1-5 GM/200ML-% IV SOLN
1000.0000 mg | Freq: Once | INTRAVENOUS | Status: DC
  Filled 2014-02-05: qty 200

## 2014-02-05 MED ORDER — ONDANSETRON HCL 4 MG/2ML IJ SOLN: 4.0000 mg | Freq: Four times a day (QID) | INTRAMUSCULAR | Status: DC | PRN

## 2014-02-05 MED ORDER — METOPROLOL SUCCINATE ER 100 MG PO TB24
200.0000 mg | ORAL_TABLET | Freq: Every day | ORAL | Status: DC
  Administered 2014-02-05 – 2014-02-08 (×4): 200 mg via ORAL
  Filled 2014-02-05 (×4): qty 2

## 2014-02-05 MED ORDER — IOHEXOL 350 MG/ML SOLN
100.0000 mL | Freq: Once | INTRAVENOUS | Status: AC | PRN
  Administered 2014-02-05: 100 mL via INTRAVENOUS

## 2014-02-05 MED ORDER — BUDESONIDE 0.25 MG/2ML IN SUSP
0.2500 mg | Freq: Two times a day (BID) | RESPIRATORY_TRACT | Status: DC
  Administered 2014-02-05 – 2014-02-08 (×7): 0.25 mg via RESPIRATORY_TRACT
  Filled 2014-02-05 (×13): qty 2

## 2014-02-05 MED ORDER — IPRATROPIUM BROMIDE 0.02 % IN SOLN
0.5000 mg | Freq: Four times a day (QID) | RESPIRATORY_TRACT | Status: DC
  Administered 2014-02-05: 0.5 mg via RESPIRATORY_TRACT
  Filled 2014-02-05: qty 2.5

## 2014-02-05 MED ORDER — VANCOMYCIN HCL IN DEXTROSE 750-5 MG/150ML-% IV SOLN
750.0000 mg | Freq: Two times a day (BID) | INTRAVENOUS | Status: DC
  Administered 2014-02-05: 750 mg via INTRAVENOUS
  Filled 2014-02-05 (×5): qty 150

## 2014-02-05 MED ORDER — SODIUM CHLORIDE 0.9 % IV SOLN
INTRAVENOUS | Status: DC
  Administered 2014-02-05: 17:00:00 via INTRAVENOUS

## 2014-02-05 MED ORDER — ADULT MULTIVITAMIN W/MINERALS CH
1.0000 | ORAL_TABLET | Freq: Every day | ORAL | Status: DC
  Administered 2014-02-05 – 2014-02-08 (×4): 1 via ORAL
  Filled 2014-02-05 (×4): qty 1

## 2014-02-05 MED ORDER — PANTOPRAZOLE SODIUM 40 MG PO TBEC
40.0000 mg | DELAYED_RELEASE_TABLET | Freq: Every day | ORAL | Status: DC
  Administered 2014-02-05 – 2014-02-08 (×4): 40 mg via ORAL
  Filled 2014-02-05 (×3): qty 1

## 2014-02-05 MED ORDER — VITAMIN C 500 MG PO TABS
500.0000 mg | ORAL_TABLET | Freq: Every day | ORAL | Status: DC
  Administered 2014-02-05 – 2014-02-08 (×4): 500 mg via ORAL
  Filled 2014-02-05 (×4): qty 1

## 2014-02-05 MED ORDER — VITAMIN D 1000 UNITS PO TABS
2000.0000 [IU] | ORAL_TABLET | Freq: Every day | ORAL | Status: DC
  Administered 2014-02-05 – 2014-02-08 (×4): 2000 [IU] via ORAL
  Filled 2014-02-05 (×4): qty 2

## 2014-02-05 MED ORDER — LORATADINE 10 MG PO TABS
10.0000 mg | ORAL_TABLET | Freq: Every day | ORAL | Status: DC
  Administered 2014-02-05 – 2014-02-08 (×4): 10 mg via ORAL
  Filled 2014-02-05 (×4): qty 1

## 2014-02-05 MED ORDER — DARIFENACIN HYDROBROMIDE ER 7.5 MG PO TB24
7.5000 mg | ORAL_TABLET | Freq: Every day | ORAL | Status: DC
  Administered 2014-02-05 – 2014-02-08 (×4): 7.5 mg via ORAL
  Filled 2014-02-05 (×4): qty 1

## 2014-02-05 MED ORDER — GUAIFENESIN 100 MG/5ML PO SOLN: 200.0000 mg | Freq: Three times a day (TID) | ORAL | Status: DC | PRN

## 2014-02-05 MED ORDER — FUROSEMIDE 10 MG/ML IJ SOLN
20.0000 mg | Freq: Once | INTRAMUSCULAR | Status: AC
  Administered 2014-02-05: 20 mg via INTRAVENOUS
  Filled 2014-02-05: qty 2

## 2014-02-05 MED ORDER — AMLODIPINE BESYLATE 10 MG PO TABS
10.0000 mg | ORAL_TABLET | Freq: Every day | ORAL | Status: DC
  Administered 2014-02-05 – 2014-02-08 (×4): 10 mg via ORAL
  Filled 2014-02-05 (×4): qty 1

## 2014-02-05 MED ORDER — SODIUM CHLORIDE 0.9 % IJ SOLN
3.0000 mL | Freq: Two times a day (BID) | INTRAMUSCULAR | Status: DC
  Administered 2014-02-05 – 2014-02-08 (×3): 3 mL via INTRAVENOUS

## 2014-02-05 MED ORDER — SODIUM CHLORIDE 0.9 % IJ SOLN
3.0000 mL | Freq: Two times a day (BID) | INTRAMUSCULAR | Status: DC
  Administered 2014-02-05 – 2014-02-08 (×5): 3 mL via INTRAVENOUS

## 2014-02-05 MED ORDER — ALBUTEROL SULFATE HFA 108 (90 BASE) MCG/ACT IN AERS: 2.0000 | INHALATION_SPRAY | Freq: Four times a day (QID) | RESPIRATORY_TRACT | Status: DC | PRN

## 2014-02-05 MED ORDER — MAGNESIUM OXIDE 400 (241.3 MG) MG PO TABS
400.0000 mg | ORAL_TABLET | Freq: Every day | ORAL | Status: DC
  Administered 2014-02-05 – 2014-02-08 (×4): 400 mg via ORAL
  Filled 2014-02-05 (×4): qty 1

## 2014-02-05 MED ORDER — LEVOTHYROXINE SODIUM 75 MCG PO TABS
75.0000 ug | ORAL_TABLET | Freq: Every day | ORAL | Status: DC
  Administered 2014-02-05 – 2014-02-06 (×2): 75 ug via ORAL
  Filled 2014-02-05 (×3): qty 1

## 2014-02-05 MED ORDER — ENOXAPARIN SODIUM 40 MG/0.4ML ~~LOC~~ SOLN
40.0000 mg | SUBCUTANEOUS | Status: DC
  Administered 2014-02-05 – 2014-02-06 (×2): 40 mg via SUBCUTANEOUS
  Filled 2014-02-05 (×2): qty 0.4

## 2014-02-05 MED ORDER — SODIUM CHLORIDE 0.9 % IJ SOLN
10.0000 mL | INTRAMUSCULAR | Status: DC | PRN
  Administered 2014-02-07: 10 mL

## 2014-02-05 MED ORDER — DEXTROSE 5 % IV SOLN
2.0000 g | Freq: Two times a day (BID) | INTRAVENOUS | Status: DC
  Administered 2014-02-05 – 2014-02-06 (×3): 2 g via INTRAVENOUS
  Filled 2014-02-05 (×4): qty 2

## 2014-02-05 NOTE — Progress Notes (Signed)
  Echocardiogram 2D Echocardiogram has been performed.  Valinda Hoar 02/05/2014, 3:21 PM

## 2014-02-05 NOTE — Progress Notes (Signed)
CARE MANAGEMENT NOTE 02/05/2014  Patient:  Laura Frey, Laura Frey   Account Number:  [id number]  Date Initiated:  02/05/2014  Documentation initiated by:  Gabriel Earing  Subjective/Objective Assessment:   pt admitted from [facility] with cco SOB     Action/Plan:   from home   Anticipated DC Date:  02/08/2014   Anticipated DC Plan:  [place]  CM consult      [place]   Choice offered to / List presented to:  C-1 Patient        [place] arranged  HH-1 RN  [place].   Status of service:  In process, will continue to follow Medicare Important Message given?  NA - LOS <3 / Initial given by admissions (If response is "NO", the following Medicare IM given date fields will be blank) Date Medicare IM given:   Date Additional Medicare IM given:    Discharge Disposition:    Per UR Regulation:  Reviewed for med. necessity/level of care/duration of stay  If discussed at [place] of Stay Meetings, dates discussed:    Comments:  02/05/14 MMcGibboney, RN, BSN Patient was taken to the ER at [facility]. Over there ER had done chest x-ray which showed right middle lobe infiltrates. Patient also was found to be hypoxic and was placed on oxygen. Patient was transferred to [facility] long hospital. Pt discharged home on 02/04/14 with [place] and plan to continue with them.

## 2014-02-05 NOTE — Progress Notes (Signed)
Peripherally Inserted Central Catheter/Midline Placement  The IV Nurse has discussed with the patient and/or persons authorized to consent for the patient, the purpose of this procedure and the potential benefits and risks involved with this procedure.  The benefits include less needle sticks, lab draws from the catheter and patient may be discharged home with the catheter.  Risks include, but not limited to, infection, bleeding, blood clot (thrombus formation), and puncture of an artery; nerve damage and irregular heat beat.  Alternatives to this procedure were also discussed.  PICC/Midline Placement Documentation        Laura Frey 02/05/2014, 4:49 PM

## 2014-02-05 NOTE — Progress Notes (Signed)
[facility] PROGRESS NOTE  Laura Frey WPY:[id number] DOB: August 16, 1932 DOA: 02/05/2014 PCP: No PCP Per Patient  Assessment/Plan: 1. Dyspnea/Hypoxia -suspect Mild Diastolic CHF vs COPD Flare -but given Abnormal D-dimer will check CTA to  r/o PE -de-escalate Abx based on CT chest  2. ABd fluid collections-last week -s/p drain  -cultures sterile then -change to PO Abx for 1 more week pending workup above  3. Elevated TSH -check free T4 -continue synthroid  4. HTN -stable, continue norvasc and Toprol  5. Chronic anemia/h/o thalassemia -monitor  6. Hypokalemia -replace  DVT proph: lovenox  Code Status: Full Code per H&P Family Communication: d/w son at bedside DIspo: Home when improved  Antibiotics: Vanc/Cefepime  HPI/Subjective: breathing better, no other complaints  Objective: Filed Vitals:   02/05/14 0534  BP: 133/59  Pulse: 89  Temp: 98.2 F (36.8 C)  Resp: 19    Intake/Output Summary (Last 24 hours) at 02/05/14 1025 Last data filed at 02/05/14 0748  Gross per 24 hour  Intake    240 ml  Output      0 ml  Net    240 ml   Filed Weights   02/05/14 0035  Weight: 84.369 kg (186 lb)    Exam:   General: AAOx3, no distress  Cardiovascular: S1S2/RRR  Respiratory: scattered ronchi  Abdomen: soft, Mildly distended, NT, Bs present  Ext: trace edema   Data Reviewed: Basic Metabolic Panel:  Recent Labs Lab 02/01/14 0420 02/03/14 0435 02/04/14 0500 02/05/14 0121 02/05/14 0509  NA 142 141 143 139 142  K 3.1* 2.6* 3.7 3.2* 3.1*  CL 104 101 101 99 102  CO2 25 27 30 29 30   GLUCOSE 100* 105* 108* 112* 118*  BUN 10 8 5* 4* 5*  CREATININE 0.86 0.78 0.77 0.84 0.87  CALCIUM 8.0* 8.0* 8.1* 8.2* 8.0*  MG  --  1.9  --   --   --    Liver Function Tests:  Recent Labs Lab 01/30/14 0317 01/31/14 0106 02/01/14 0420 02/03/14 0435 02/05/14 0121  AST 19 14 24 25 22   ALT 10 9 9 12 13   ALKPHOS 128* 92 86 76 67  BILITOT 0.5 0.4 0.3 0.3 0.4   PROT 6.4 5.8* 5.7* 5.9* 6.0  ALBUMIN 2.6* 2.3* 2.3* 2.3* 2.5*   No results found for this basename: LIPASE, AMYLASE,  in the last 168 hours No results found for this basename: AMMONIA,  in the last 168 hours CBC:  Recent Labs Lab 01/30/14 0317 01/31/14 0106 02/01/14 0420 02/03/14 0435 02/04/14 0418 02/05/14 0121 02/05/14 0509  WBC 32.5* 24.0* 19.1* 18.8* 17.8* 13.6* 13.9*  NEUTROABS  --  20.7* 15.7* 14.2*  --  9.8*  --   HGB 10.3* 8.9* 8.8* 9.7* 9.6* 9.1* 9.5*  HCT 32.3* 27.9* 26.3* 29.5* 29.1* 27.2* 29.9*  MCV 63.8* 63.6* 61.7* 62.0* 62.3* 62.2* 63.8*  PLT 426* 399 421* 450* 428* 349 420*   Cardiac Enzymes:  Recent Labs Lab 02/05/14 0121  TROPONINI <0.30   BNP (last 3 results)  Recent Labs  02/05/14 0121  PROBNP 2497.0*   CBG: No results found for this basename: GLUCAP,  in the last 168 hours  Recent Results (from the past 240 hour(s))  MRSA PCR SCREENING     Status: None   Collection Time    01/27/14  8:56 PM      Result Value Ref Range Status   MRSA by PCR NEGATIVE  NEGATIVE Final   Comment:  The GeneXpert MRSA Assay (FDA     approved for NASAL specimens     only), is one component of a     comprehensive MRSA colonization     surveillance program. It is not     intended to diagnose MRSA     infection nor to guide or     monitor treatment for     MRSA infections.  CULTURE, BLOOD (ROUTINE X 2)     Status: None   Collection Time    01/27/14 10:39 PM      Result Value Ref Range Status   Specimen Description BLOOD BLOOD RIGHT FOREARM   Final   Special Requests BOTTLES DRAWN AEROBIC ONLY 8CC   Final   Culture  Setup Time     Final   Value: 01/28/2014 03:56     Performed at [facility]   Culture     Final   Value: NO GROWTH 5 DAYS     Performed at [facility]   Report Status 02/03/2014 FINAL   Final  CULTURE, BLOOD (ROUTINE X 2)     Status: None   Collection Time    01/27/14 10:47 PM      Result Value Ref Range Status    Specimen Description BLOOD LEFT ANTECUBITAL   Final   Special Requests BOTTLES DRAWN AEROBIC AND ANAEROBIC 10CC   Final   Culture  Setup Time     Final   Value: 01/28/2014 03:56     Performed at [facility]   Culture     Final   Value: NO GROWTH 5 DAYS     Performed at [facility]   Report Status 02/03/2014 FINAL   Final  URINE CULTURE     Status: None   Collection Time    01/27/14 10:53 PM      Result Value Ref Range Status   Specimen Description URINE, CLEAN CATCH   Final   Special Requests NONE   Final   Culture  Setup Time     Final   Value: 01/28/2014 05:28     Performed at [facility]     Final   Value: NO GROWTH     Performed at [facility]   Culture     Final   Value: NO GROWTH     Performed at [facility]   Report Status 01/29/2014 FINAL   Final  CLOSTRIDIUM DIFFICILE BY PCR     Status: None   Collection Time    01/28/14  4:36 AM      Result Value Ref Range Status   C difficile by pcr NEGATIVE  NEGATIVE Final   Comment: Performed at [facility]  BODY FLUID CULTURE     Status: None   Collection Time    01/29/14 12:43 PM      Result Value Ref Range Status   Specimen Description FLUID PELVIC   Final   Special Requests NONE   Final   Gram Stain     Final   Value: FEW WBC PRESENT,BOTH PMN AND MONONUCLEAR     NO ORGANISMS SEEN     Performed at [facility]   Culture     Final   Value: NO GROWTH 3 DAYS     Performed at [facility]   Report Status 02/01/2014 FINAL   Final     Studies: Dg Chest Port 1 View  02/05/2014  CLINICAL DATA:  Fever.  Pulmonary infiltrates.  EXAM: PORTABLE CHEST - 1 VIEW  COMPARISON:  02/04/2014  FINDINGS: Shallow inspiration with elevation of right hemidiaphragm similar to previous study. Linear atelectasis in both lung bases. Normal heart size and pulmonary vascularity. No significant consolidation. No pneumothorax. Degenerative changes in the shoulder  and spine. Postoperative changes in the cervical spine.  IMPRESSION: Shallow inspiration with atelectasis in the lung bases.   Electronically Signed   By: Lucienne Capers M.D.   On: 02/05/2014 01:42    Scheduled Meds: . amLODipine  10 mg Oral Daily  . budesonide (PULMICORT) nebulizer solution  0.25 mg Nebulization BID  . ceFEPime (MAXIPIME) IV  2 g Intravenous Q12H  . cholecalciferol  2,000 Units Oral Daily  . darifenacin  7.5 mg Oral Daily  . enoxaparin (LOVENOX) injection  40 mg Subcutaneous Q24H  . ipratropium-albuterol  3 mL Nebulization QID  . levothyroxine  75 mcg Oral QAC breakfast  . loratadine  10 mg Oral Daily  . magnesium oxide  400 mg Oral Daily  . metoprolol  200 mg Oral Daily  . multivitamin with minerals  1 tablet Oral Daily  . pantoprazole  40 mg Oral Daily  . potassium chloride SA  40 mEq Oral BID WC  . sodium chloride  3 mL Intravenous Q12H  . sodium chloride  3 mL Intravenous Q12H  . vancomycin  750 mg Intravenous Q12H  . vitamin C  500 mg Oral Daily   Continuous Infusions:  Antibiotics Given (last 72 hours)   Date/Time Action Medication Dose Rate   02/05/14 0815 Given   ceFEPIme (MAXIPIME) 2 g in dextrose 5 % 50 mL IVPB 2 g 100 mL/hr      Principal Problem:   Shortness of breath Active Problems:   Unspecified hypothyroidism   HTN (hypertension)   Anemia   SOB (shortness of breath)    Time spent: 75min    [facility] Pager [phone]. If 7PM-7AM, please contact night-coverage at [email or web address], password [facility] 02/05/2014, 10:25 AM  LOS: 0 days

## 2014-02-05 NOTE — Progress Notes (Signed)
ANTIBIOTIC CONSULT NOTE - INITIAL  Pharmacy Consult for vancomycin, cefepime Indication: pneumonia  Allergies  Allergen Reactions  . Hydrocodone     hallucinations  . Oxycodone     hallucinations  . Zoloft [Sertraline Hcl]     Burns stomach  . Ciprofloxacin Swelling  . Sulfa Antibiotics     Blisters in mouth  . Tylenol [Acetaminophen]     headaches  . Ultram [Tramadol]     headaches    Patient Measurements: Height: 5\' 2"  (157.5 cm) Weight: 186 lb (84.369 kg) IBW/kg (Calculated) : 50.1 Adjusted Body Weight:   Vital Signs: Temp: 98.1 F (36.7 C) (04/22 0035) Temp src: Oral (04/22 0035) BP: 145/64 mmHg (04/22 0035) Pulse Rate: 90 (04/22 0035) Intake/Output from previous day:   Intake/Output from this shift:    Labs:  Recent Labs  02/03/14 0435 02/04/14 0418 02/04/14 0500 02/05/14 0121  WBC 18.8* 17.8*  --  13.6*  HGB 9.7* 9.6*  --  9.1*  PLT 450* 428*  --  349  CREATININE 0.78  --  0.77 0.84   Estimated Creatinine Clearance: 54.7 ml/min (by C-G formula based on Cr of 0.84). No results found for this basename: VANCOTROUGH, Corlis Leak, VANCORANDOM, [place], GENTPEAK, GENTRANDOM, TOBRATROUGH, TOBRAPEAK, TOBRARND, AMIKACINPEAK, AMIKACINTROU, AMIKACIN,  in the last 72 hours   Microbiology: Recent Results (from the past 720 hour(s))  MRSA PCR SCREENING     Status: None   Collection Time    01/27/14  8:56 PM      Result Value Ref Range Status   MRSA by PCR NEGATIVE  NEGATIVE Final   Comment:            The GeneXpert MRSA Assay (FDA     approved for NASAL specimens     only), is one component of a     comprehensive MRSA colonization     surveillance program. It is not     intended to diagnose MRSA     infection nor to guide or     monitor treatment for     MRSA infections.  CULTURE, BLOOD (ROUTINE X 2)     Status: None   Collection Time    01/27/14 10:39 PM      Result Value Ref Range Status   Specimen Description BLOOD BLOOD RIGHT FOREARM   Final    Special Requests BOTTLES DRAWN AEROBIC ONLY 8CC   Final   Culture  Setup Time     Final   Value: 01/28/2014 03:56     Performed at [facility]   Culture     Final   Value: NO GROWTH 5 DAYS     Performed at [facility]   Report Status 02/03/2014 FINAL   Final  CULTURE, BLOOD (ROUTINE X 2)     Status: None   Collection Time    01/27/14 10:47 PM      Result Value Ref Range Status   Specimen Description BLOOD LEFT ANTECUBITAL   Final   Special Requests BOTTLES DRAWN AEROBIC AND ANAEROBIC 10CC   Final   Culture  Setup Time     Final   Value: 01/28/2014 03:56     Performed at [facility]   Culture     Final   Value: NO GROWTH 5 DAYS     Performed at [facility]   Report Status 02/03/2014 FINAL   Final  URINE CULTURE     Status: None   Collection Time    01/27/14  10:53 PM      Result Value Ref Range Status   Specimen Description URINE, CLEAN CATCH   Final   Special Requests NONE   Final   Culture  Setup Time     Final   Value: 01/28/2014 05:28     Performed at [place]     Final   Value: NO GROWTH     Performed at [facility]   Culture     Final   Value: NO GROWTH     Performed at [facility]   Report Status 01/29/2014 FINAL   Final  CLOSTRIDIUM DIFFICILE BY PCR     Status: None   Collection Time    01/28/14  4:36 AM      Result Value Ref Range Status   C difficile by pcr NEGATIVE  NEGATIVE Final   Comment: Performed at [facility]  BODY FLUID CULTURE     Status: None   Collection Time    01/29/14 12:43 PM      Result Value Ref Range Status   Specimen Description FLUID PELVIC   Final   Special Requests NONE   Final   Gram Stain     Final   Value: FEW WBC PRESENT,BOTH PMN AND MONONUCLEAR     NO ORGANISMS SEEN     Performed at [facility]   Culture     Final   Value: NO GROWTH 3 DAYS     Performed at [facility]   Report Status 02/01/2014 FINAL   Final     Medical History: Past Medical History  Diagnosis Date  . Hypertension   . Hypothyroidism   . Depression   . Shortness of breath   . Asthma   . Peripheral vascular disease     legs  . GERD (gastroesophageal reflux disease)   . Headache(784.0)   . Cancer     melanoma on back  . Anemia     hx Thalassemia minor anemia  . Tendinitis of both rotator cuffs   . Chronic back pain     ESI/uses BC powders    Medications:  Anti-infectives   Start     Dose/Rate Route Frequency Ordered Stop   02/05/14 1200  vancomycin (VANCOCIN) IVPB 750 mg/150 ml premix     750 mg 150 mL/hr over 60 Minutes Intravenous Every 12 hours 02/05/14 0231     02/05/14 0800  ceFEPIme (MAXIPIME) 2 g in dextrose 5 % 50 mL IVPB     2 g 100 mL/hr over 30 Minutes Intravenous Every 12 hours 02/05/14 0231     02/05/14 0115  vancomycin (VANCOCIN) IVPB 1000 mg/200 mL premix  Status:  Discontinued     1,000 mg 200 mL/hr over 60 Minutes Intravenous  Once 02/05/14 0102 02/05/14 0129   02/05/14 0115  ceFEPIme (MAXIPIME) 2 g in dextrose 5 % 50 mL IVPB  Status:  Discontinued     2 g 100 mL/hr over 30 Minutes Intravenous  Once 02/05/14 0102 02/05/14 0129     Assessment: Patient with PNA.  Per RN, patient has received Cefepime iv x1 at 2030 and vancomycin 1gm iv x1 at 1751 while at [facility].  Changed times of antibiotic start based on this new information.  Goal of Therapy:  Vancomycin trough level 15-20 mcg/ml Cefepime dosed based on patient weight and renal function   Plan:  Measure antibiotic drug levels at steady state Follow up culture results Vancomycin  750mg  iv q12hr Cefepime 2gm iv q12hr   [employer]. 02/05/2014,2:32 AM

## 2014-02-05 NOTE — Evaluation (Signed)
Physical Therapy Evaluation Patient Details Name: Laura Frey MRN: [id number] DOB: [birth year] Today's Date: 02/05/2014   History of Present Illness  Pt is a 80 y.o. female who was recently discharged after having sepsis, pneumonia, abdominal abscess, and bowel obstruction and readmitted 4/22 due to pt becaming short of breath and weak.  Clinical Impression  Pt currently with functional limitations due to the deficits listed below (see PT Problem List).  Pt will benefit from skilled PT to increase their independence and safety with mobility to allow discharge to the venue listed below.  Pt ambulated in hallway on 2L O2 [place] with no c/o SOB.  Discussed d/c plans with pt and son and both wish for d/c home.  Son reported he believes pt was readmitted due to not receiving O2 for home upon previous discharge so explained home O2 requirements with pt and son and that RN and/or PT would continue to assess O2 needs during mobility while hospitalized.  MD into room end of session.     Follow Up Recommendations Home health PT    Equipment Recommendations  None recommended by PT    Recommendations for Other Services       Precautions / Restrictions Precautions Precautions: Fall Precaution Comments: monitor sats Restrictions Weight Bearing Restrictions: No      Mobility  Bed Mobility Overal bed mobility: Needs Assistance Bed Mobility: Supine to Sit     Supine to sit: Min guard     General bed mobility comments: increased time and use of rails.  Transfers Overall transfer level: Needs assistance Equipment used: Rolling walker (2 wheeled) Transfers: Sit to/from Stand Sit to Stand: Min assist         General transfer comment: verbal cues for safe technique  Ambulation/Gait Ambulation/Gait assistance: Min guard Ambulation Distance (Feet): 160 Feet Assistive device: Rolling walker (2 wheeled) Gait Pattern/deviations: Step-through pattern;Trunk flexed;Decreased stride length Gait  velocity: decreased   General Gait Details: pt reports no SOB, ambulated on 2L O2 [place] and SpO2 95%  Stairs            Wheelchair Mobility    Modified Rankin (Stroke Patients Only)       Balance                                             Pertinent Vitals/Pain SpO2 93% 2L O2 at rest 95% 2L O2 ambulating 97% 2L O2 upon return to [place] expects to be discharged to:: Private residence Living Arrangements: Children Available Help at Discharge: Family;Available 24 hours/day Type of Home: House Home Access: Ramped entrance     Home Layout: One level Home Equipment: Walker - 2 wheels;Grab bars - toilet;Grab bars - tub/shower;Shower seat;Bedside commode      Prior Function Level of Independence: Independent with assistive device(s)         Comments: 3 wheeled RW     Hand Dominance   Dominant Hand: Right    Extremity/Trunk Assessment               Lower Extremity Assessment: Generalized weakness         Communication   Communication: No difficulties  Cognition Arousal/Alertness: Awake/alert Behavior During Therapy: WFL for tasks assessed/performed Overall Cognitive Status: Within Functional Limits for tasks assessed  General Comments      Exercises        Assessment/Plan    PT Assessment Patient needs continued PT services  PT Diagnosis Difficulty walking   PT Problem List Decreased strength;Decreased range of motion;Decreased activity tolerance;Decreased mobility;Decreased knowledge of use of DME;Obesity  PT Treatment Interventions DME instruction;Gait training;Functional mobility training;Therapeutic activities;Therapeutic exercise;Patient/family education   PT Goals (Current goals can be found in the Care Plan section) Acute Rehab PT Goals Patient Stated Goal: d/c home PT Goal Formulation: With patient Time For Goal Achievement: 02/12/14 Potential to  Achieve Goals: Good    Frequency Min 3X/week   Barriers to discharge        Co-evaluation               End of Session Equipment Utilized During Treatment: Gait belt;Oxygen Activity Tolerance: Patient tolerated treatment well Patient left: in chair;with call bell/phone within reach;with family/visitor present Nurse Communication: Mobility status         Time: 8250-5397 PT Time Calculation (min): 21 min   Charges:   PT Evaluation $Initial PT Evaluation Tier I: 1 Procedure PT Treatments $Gait Training: 8-22 mins   PT G CodesJunius Argyle 02/05/2014, 11:13 AM Carmelia Bake, PT, DPT 02/05/2014 Pager: [phone]

## 2014-02-05 NOTE — H&P (Signed)
[facility] History and Physical  Laura Frey ZSW:[id number] DOB: 08-01-1932 DOA: 02/05/2014  Referring physician: ER physician [facility]. PCP: No PCP Per Patient   Chief Complaint: Shortness of breath.  HPI: Laura Frey is a 80 y.o. female who was discharged yesterday after having a complicated course with sepsis probably from pneumonia and abdominal abscess further completed by bowel obstruction was discharged home yesterday following which patient became short of breath and weak. Patient was taken to the ER at [facility]. Over there ER had done chest x-ray which showed right middle lobe infiltrates. Patient also was found to be hypoxic and was placed on oxygen. Patient was transferred to [facility]. Patient on my exam is mildly wheezing but denies any chest pain. Denies any abdominal pain nausea vomiting diarrhea. Complains of mild rectal pain. Chest x-ray done on the head does not show any definite infiltrates and labs show improvement in the leukocytosis. BNP is mildly elevated. Patient has been admitted for further management.   Review of Systems: As presented in the history of presenting illness, rest negative.  Past Medical History  Diagnosis Date  . Hypertension   . Hypothyroidism   . Depression   . Shortness of breath   . Asthma   . Peripheral vascular disease     legs  . GERD (gastroesophageal reflux disease)   . Headache(784.0)   . Cancer     melanoma on back  . Anemia     hx Thalassemia minor anemia  . Tendinitis of both rotator cuffs   . Chronic back pain     ESI/uses BC powders   Past Surgical History  Procedure Laterality Date  . Tubal ligation    . Appendectomy    . Tonsillectomy    . Eye surgery Bilateral     cataracts  . Abdominal hysterectomy      vagina  . Bladder repair      after hysterectomy same day  . Anterior cervical decomp/discectomy fusion N/A 05/01/2013    Procedure: ANTERIOR CERVICAL  DECOMPRESSION/DISCECTOMY FUSION 3 LEVELS;  Surgeon: Elaina Hoops, MD;  Location: [facility];  Service: Neurosurgery;  Laterality: N/A;  ANTERIOR CERVICAL DECOMPRESSION/DISCECTOMY FUSION 3 LEVELS   Social History:  reports that she quit smoking about 18 years ago. Her smoking use included Cigarettes. She has a 30 pack-year smoking history. She has never used smokeless tobacco. She reports that she does not drink alcohol or use illicit drugs. Where does patient live home. Can patient participate in ADLs? Not sure.  Allergies  Allergen Reactions  . Hydrocodone     hallucinations  . Oxycodone     hallucinations  . Zoloft [Sertraline Hcl]     Burns stomach  . Ciprofloxacin Swelling  . Sulfa Antibiotics     Blisters in mouth  . Tylenol [Acetaminophen]     headaches  . Ultram [Tramadol]     headaches    Family History:  Family History  Problem Relation Age of Onset  . Thalassemia Son   . Thalassemia Paternal Aunt       Prior to Admission medications   Medication Sig Start Date End Date Taking? Authorizing Provider  albuterol (PROVENTIL HFA;VENTOLIN HFA) 108 (90 BASE) MCG/ACT inhaler Inhale 2 puffs into the lungs every 6 (six) hours as needed for wheezing.   Yes Historical Provider, MD  amLODipine (NORVASC) 5 MG tablet Take 2 tablets (10 mg total) by mouth daily. 02/04/14  Yes Nita Sells, MD  Cholecalciferol (VITAMIN D) 2000 UNITS tablet Take 2,000 Units by mouth daily.   Yes Historical Provider, MD  estrogens, conjugated, (PREMARIN) 0.45 MG tablet Take 0.45 mg by mouth daily.    Yes Historical Provider, MD  fexofenadine (ALLEGRA) 180 MG tablet Take 180 mg by mouth daily.   Yes Historical Provider, MD  guaifenesin (ROBITUSSIN) 100 MG/5ML syrup Take 200 mg by mouth 3 (three) times daily as needed for cough.   Yes Historical Provider, MD  levothyroxine (SYNTHROID, LEVOTHROID) 75 MCG tablet Take 75 mcg by mouth daily before breakfast.   Yes Historical Provider, MD  magnesium  oxide (MAG-OX) 400 MG tablet Take 400 mg by mouth daily.   Yes Historical Provider, MD  metoprolol (TOPROL-XL) 200 MG 24 hr tablet Take 200 mg by mouth daily.   Yes Historical Provider, MD  Multiple Vitamin (MULTIVITAMIN WITH MINERALS) TABS Take 1 tablet by mouth daily.   Yes Historical Provider, MD  omeprazole (PRILOSEC) 20 MG capsule Take 20 mg by mouth daily.   Yes Historical Provider, MD  potassium chloride SA (K-DUR,KLOR-CON) 20 MEQ tablet Take 2 tablets (40 mEq total) by mouth 2 (two) times daily with a meal. 02/04/14  Yes Nita Sells, MD  solifenacin (VESICARE) 5 MG tablet Take 5 mg by mouth daily.   Yes Historical Provider, MD  vitamin C (ASCORBIC ACID) 500 MG tablet Take 500 mg by mouth daily.   Yes Historical Provider, MD  VITAMIN E PO Take 500 Units by mouth daily.   Yes Historical Provider, MD    Physical Exam: Filed Vitals:   02/05/14 0035 02/05/14 0101  BP: 145/64   Pulse: 90   Temp: 98.1 F (36.7 C)   TempSrc: Oral   Resp: 20   Height:  5\' 2"  (1.575 m)  Weight: 84.369 kg (186 lb)   SpO2: 97%      General:  Well developed and moderately nourished.  Eyes: Anicteric no pallor.  ENT: No discharge from the ears eyes nose mouth.  Neck: No mass felt.  Cardiovascular: S1-S2 heard.  Respiratory: Mild expiratory wheeze no crepitations.  Abdomen: Soft nontender bowel sounds present.  Skin: Dressing on the right buttock.  Musculoskeletal: No edema.  Psychiatric: Appears normal.  Neurologic: Alert awake oriented to time place and person. Moves all extremities.  Labs on Admission:  Basic Metabolic Panel:  Recent Labs Lab 01/31/14 0106 02/01/14 0420 02/03/14 0435 02/04/14 0500 02/05/14 0121  NA 137 142 141 143 139  K 3.4* 3.1* 2.6* 3.7 3.2*  CL 102 104 101 101 99  CO2 23 25 27 30 29   GLUCOSE 106* 100* 105* 108* 112*  BUN 11 10 8  5* 4*  CREATININE 0.93 0.86 0.78 0.77 0.84  CALCIUM 8.2* 8.0* 8.0* 8.1* 8.2*  MG  --   --  1.9  --   --    Liver  Function Tests:  Recent Labs Lab 01/30/14 0317 01/31/14 0106 02/01/14 0420 02/03/14 0435 02/05/14 0121  AST 19 14 24 25 22   ALT 10 9 9 12 13   ALKPHOS 128* 92 86 76 67  BILITOT 0.5 0.4 0.3 0.3 0.4  PROT 6.4 5.8* 5.7* 5.9* 6.0  ALBUMIN 2.6* 2.3* 2.3* 2.3* 2.5*   No results found for this basename: LIPASE, AMYLASE,  in the last 168 hours No results found for this basename: AMMONIA,  in the last 168 hours CBC:  Recent Labs Lab 01/30/14 0317 01/31/14 0106 02/01/14 0420 02/03/14 0435 02/04/14 0418 02/05/14 0121  WBC 32.5* 24.0* 19.1* 18.8*  17.8* 13.6*  NEUTROABS  --  20.7* 15.7* 14.2*  --  9.8*  HGB 10.3* 8.9* 8.8* 9.7* 9.6* 9.1*  HCT 32.3* 27.9* 26.3* 29.5* 29.1* 27.2*  MCV 63.8* 63.6* 61.7* 62.0* 62.3* 62.2*  PLT 426* 399 421* 450* 428* 349   Cardiac Enzymes:  Recent Labs Lab 02/05/14 0121  TROPONINI <0.30    BNP (last 3 results)  Recent Labs  02/05/14 0121  PROBNP 2497.0*   CBG: No results found for this basename: GLUCAP,  in the last 168 hours  Radiological Exams on Admission: Dg Chest Port 1 View  02/05/2014   CLINICAL DATA:  Fever.  Pulmonary infiltrates.  EXAM: PORTABLE CHEST - 1 VIEW  COMPARISON:  02/04/2014  FINDINGS: Shallow inspiration with elevation of right hemidiaphragm similar to previous study. Linear atelectasis in both lung bases. Normal heart size and pulmonary vascularity. No significant consolidation. No pneumothorax. Degenerative changes in the shoulder and spine. Postoperative changes in the cervical spine.  IMPRESSION: Shallow inspiration with atelectasis in the lung bases.   Electronically Signed   By: Lucienne Capers M.D.   On: 02/05/2014 01:42     Assessment/Plan Principal Problem:   Shortness of breath Active Problems:   Unspecified hypothyroidism   HTN (hypertension)   Anemia   SOB (shortness of breath)   1. Shortness of breath with wheezing - since patient's BNP is mildly elevated I have ordered Lasix 20 mg IV. I have  placed patient on Pulmicort and nebulizer and since x-rays done at [facility] was showing possible pneumonia patient has been placed on empiric antibiotics for now. Check procalcitonin levels. I have ordered 2-D echo. 2. Hypothyroidism - continue Synthroid. 3. Hypertension - medications were recently changed. 4. Anemia - follow CBC. 5. Recently admitted for sepsis from pneumonia and abdominal abscess - drains were recently removed.  Consult physical therapy. Patient probably will need rehabilitation placement.  Code Status: Full code.  Family Communication: Family at the bedside.  Disposition Plan: Admit to inpatient.    [facility] Pager [phone].  If 7PM-7AM, please contact night-coverage [email or web address] Password [facility] 02/05/2014, 4:28 AM

## 2014-02-06 DIAGNOSIS — K219 Gastro-esophageal reflux disease without esophagitis: Secondary | ICD-10-CM

## 2014-02-06 MED ORDER — FUROSEMIDE 10 MG/ML IJ SOLN
20.0000 mg | Freq: Two times a day (BID) | INTRAMUSCULAR | Status: DC
  Administered 2014-02-06 – 2014-02-07 (×2): 20 mg via INTRAVENOUS
  Filled 2014-02-06 (×4): qty 2

## 2014-02-06 MED ORDER — AMOXICILLIN-POT CLAVULANATE 875-125 MG PO TABS
1.0000 | ORAL_TABLET | Freq: Two times a day (BID) | ORAL | Status: DC
  Administered 2014-02-06 – 2014-02-08 (×5): 1 via ORAL
  Filled 2014-02-06 (×6): qty 1

## 2014-02-06 MED ORDER — LEVOTHYROXINE SODIUM 100 MCG PO TABS
100.0000 ug | ORAL_TABLET | Freq: Every day | ORAL | Status: DC
  Administered 2014-02-07 – 2014-02-08 (×2): 100 ug via ORAL
  Filled 2014-02-06 (×3): qty 1

## 2014-02-06 NOTE — Clinical Documentation Improvement (Signed)
Possible Clinical Conditions? Acute Systolic Congestive Heart Failure Acute Diastolic Congestive Heart Failure Acute Systolic & Diastolic Congestive Heart Failure Acute on Chronic Systolic Congestive Heart Failure Acute on Chronic Diastolic Congestive Heart Failure Acute on Chronic Systolic & Diastolic Congestive Heart Failure Chronic Systolic Congestive Heart Failure Chronic Diastolic Congestive Heart Failure Chronic Systolic & Diastolic Congestive Heart Failure Other Condition Cannot Clinically Determine  Supporting Information: Signs & Symptoms:Dyspnea/Hypoxia  -suspect Mild Diastolic CHF vs COPD Flare  -but given Abnormal D-dimer will check CTA to r/o PE  -de-escalate Abx based on CT chest  Diagnostics: BNP 02/05/14 2497.0 (H)   Treatment: (LASIX) injection 20 mg   [[id number]]    Ordered Dose: 20 mg Route: Intravenous Frequency:  Once   (LASIX) injection 20 mg   [[id number]]    Ordered Dose: 20 mg Route: Intravenous Frequency: Daily   Thank You, Alessandra Grout, RN, BSN, CCDS, Clinical Documentation Specialist:  [id number]   [phone]=[facility]- Health Information Management

## 2014-02-06 NOTE — Progress Notes (Signed)
Physical Therapy Treatment Patient Details Name: Laura Frey MRN: [id number] DOB: 09-19-1932 Today's Date: 02/06/2014  SATURATION QUALIFICATIONS: (This note is used to comply with regulatory documentation for home oxygen)  Patient Saturations on Room Air at Rest = 92%  Patient Saturations on Room Air while Ambulating = 89%    History of Present Illness Pt is a 80 y.o. female who was recently discharged after having sepsis, pneumonia, abdominal abscess, and bowel obstruction and readmitted 4/22 due to pt becaming short of breath and weak.    PT Comments    Progressing with mobility.   Follow Up Recommendations  Home health PT     Equipment Recommendations  None recommended by PT    Recommendations for Other Services OT consult     Precautions / Restrictions Precautions Precautions: Fall Restrictions Weight Bearing Restrictions: No    Mobility  Bed Mobility               General bed mobility comments: pt in recliner  Transfers Overall transfer level: Needs assistance Equipment used: 4-wheeled walker Transfers: Sit to/from Stand Sit to Stand: Min guard         General transfer comment: close guard for safety from recliner  Ambulation/Gait Ambulation/Gait assistance: Min guard Ambulation Distance (Feet): 175 Feet Assistive device: 4-wheeled walker Gait Pattern/deviations: Decreased stride length;Step-through pattern     General Gait Details: slow gait speed. O2 sats 89% on RA during ambulation.    Stairs            Wheelchair Mobility    Modified Rankin (Stroke Patients Only)       Balance                                    Cognition Arousal/Alertness: Awake/alert Behavior During Therapy: WFL for tasks assessed/performed Overall Cognitive Status: Within Functional Limits for tasks assessed                      Exercises General Exercises - Lower Extremity Long Arc Quad: AROM;Both;10 reps;Seated     General Comments        Pertinent Vitals/Pain No c/o pain    Home Living                      Prior Function            PT Goals (current goals can now be found in the care plan section) Progress towards PT goals: Progressing toward goals    Frequency  Min 3X/week    PT Plan Current plan remains appropriate    Co-evaluation             End of Session Equipment Utilized During Treatment: Gait belt Activity Tolerance: Patient tolerated treatment well Patient left: in chair;with call bell/phone within reach;with family/visitor present     Time: 1405-1440 PT Time Calculation (min): 35 min  Charges:  $Gait Training: 8-22 mins $Therapeutic Activity: 8-22 mins                    G Codes:      Weston Anna, MPT Pager: [phone]

## 2014-02-06 NOTE — Progress Notes (Signed)
[facility] PROGRESS NOTE  Laura Frey DDU:[id number] DOB: 02-Jul-1932 DOA: 02/05/2014 PCP: No PCP Per Patient  Assessment/Plan: 1. Acute Diastolic CHF-Mild -mainly iatrogenic due to sepsis and IVF administration last admission -CTA negative for PE, small effusions noted -no pneumonia, will stop Vanc/cefepime -ECHO normal no systolic or diastolic CHF -I/Os inaccurate -change to po lasix -wean O2  2. ABd fluid collections-last week -s/p drain  -cultures sterile then -change to PO Augmentin 1 more week   3. Elevated TSH -check free T4 -continue synthroid  4. HTN -stable, continue norvasc and Toprol  5. Chronic anemia/h/o thalassemia -monitor  6. Hypokalemia -replace  DVT proph: lovenox  Code Status: Full Code per H&P Family Communication: d/w son at bedside DIspo: Home when improved  Antibiotics: Vanc/Cefepime  HPI/Subjective: breathing better, no other complaints  Objective: Filed Vitals:   02/06/14 1024  BP: 135/70  Pulse: 102  Temp:   Resp: 20    Intake/Output Summary (Last 24 hours) at 02/06/14 1430 Last data filed at 02/06/14 1251  Gross per 24 hour  Intake   1750 ml  Output    750 ml  Net   1000 ml   Filed Weights   02/05/14 0035 02/06/14 0624  Weight: 84.369 kg (186 lb) 84.6 kg (186 lb 8.2 oz)    Exam:   General: AAOx3, no distress  Cardiovascular: S1S2/RRR  Respiratory: scattered ronchi  Abdomen: soft, Mildly distended, NT, Bs present  Ext: trace edema   Data Reviewed: Basic Metabolic Panel:  Recent Labs Lab 02/01/14 0420 02/03/14 0435 02/04/14 0500 02/05/14 0121 02/05/14 0509  NA 142 141 143 139 142  K 3.1* 2.6* 3.7 3.2* 3.1*  CL 104 101 101 99 102  CO2 25 27 30 29 30   GLUCOSE 100* 105* 108* 112* 118*  BUN 10 8 5* 4* 5*  CREATININE 0.86 0.78 0.77 0.84 0.87  CALCIUM 8.0* 8.0* 8.1* 8.2* 8.0*  MG  --  1.9  --   --   --    Liver Function Tests:  Recent Labs Lab 01/31/14 0106 02/01/14 0420 02/03/14 0435  02/05/14 0121  AST 14 24 25 22   ALT 9 9 12 13   ALKPHOS 92 86 76 67  BILITOT 0.4 0.3 0.3 0.4  PROT 5.8* 5.7* 5.9* 6.0  ALBUMIN 2.3* 2.3* 2.3* 2.5*   No results found for this basename: LIPASE, AMYLASE,  in the last 168 hours No results found for this basename: AMMONIA,  in the last 168 hours CBC:  Recent Labs Lab 01/31/14 0106 02/01/14 0420 02/03/14 0435 02/04/14 0418 02/05/14 0121 02/05/14 0509  WBC 24.0* 19.1* 18.8* 17.8* 13.6* 13.9*  NEUTROABS 20.7* 15.7* 14.2*  --  9.8*  --   HGB 8.9* 8.8* 9.7* 9.6* 9.1* 9.5*  HCT 27.9* 26.3* 29.5* 29.1* 27.2* 29.9*  MCV 63.6* 61.7* 62.0* 62.3* 62.2* 63.8*  PLT 399 421* 450* 428* 349 420*   Cardiac Enzymes:  Recent Labs Lab 02/05/14 0121  TROPONINI <0.30   BNP (last 3 results)  Recent Labs  02/05/14 0121  PROBNP 2497.0*   CBG: No results found for this basename: GLUCAP,  in the last 168 hours  Recent Results (from the past 240 hour(s))  MRSA PCR SCREENING     Status: None   Collection Time    01/27/14  8:56 PM      Result Value Ref Range Status   MRSA by PCR NEGATIVE  NEGATIVE Final   Comment:  The GeneXpert MRSA Assay (FDA     approved for NASAL specimens     only), is one component of a     comprehensive MRSA colonization     surveillance program. It is not     intended to diagnose MRSA     infection nor to guide or     monitor treatment for     MRSA infections.  CULTURE, BLOOD (ROUTINE X 2)     Status: None   Collection Time    01/27/14 10:39 PM      Result Value Ref Range Status   Specimen Description BLOOD BLOOD RIGHT FOREARM   Final   Special Requests BOTTLES DRAWN AEROBIC ONLY 8CC   Final   Culture  Setup Time     Final   Value: 01/28/2014 03:56     Performed at [facility]   Culture     Final   Value: NO GROWTH 5 DAYS     Performed at [facility]   Report Status 02/03/2014 FINAL   Final  CULTURE, BLOOD (ROUTINE X 2)     Status: None   Collection Time    01/27/14 10:47  PM      Result Value Ref Range Status   Specimen Description BLOOD LEFT ANTECUBITAL   Final   Special Requests BOTTLES DRAWN AEROBIC AND ANAEROBIC 10CC   Final   Culture  Setup Time     Final   Value: 01/28/2014 03:56     Performed at [facility]   Culture     Final   Value: NO GROWTH 5 DAYS     Performed at [facility]   Report Status 02/03/2014 FINAL   Final  URINE CULTURE     Status: None   Collection Time    01/27/14 10:53 PM      Result Value Ref Range Status   Specimen Description URINE, CLEAN CATCH   Final   Special Requests NONE   Final   Culture  Setup Time     Final   Value: 01/28/2014 05:28     Performed at [place] [facility]     Final   Value: NO GROWTH     Performed at [facility]   Culture     Final   Value: NO GROWTH     Performed at [facility]   Report Status 01/29/2014 FINAL   Final  CLOSTRIDIUM DIFFICILE BY PCR     Status: None   Collection Time    01/28/14  4:36 AM      Result Value Ref Range Status   C difficile by pcr NEGATIVE  NEGATIVE Final   Comment: Performed at [facility]  BODY FLUID CULTURE     Status: None   Collection Time    01/29/14 12:43 PM      Result Value Ref Range Status   Specimen Description FLUID PELVIC   Final   Special Requests NONE   Final   Gram Stain     Final   Value: FEW WBC PRESENT,BOTH PMN AND MONONUCLEAR     NO ORGANISMS SEEN     Performed at [facility]   Culture     Final   Value: NO GROWTH 3 DAYS     Performed at [facility]   Report Status 02/01/2014 FINAL   Final     Studies: Ct Angio Chest Pe W/cm &/or Wo Cm  02/06/2014   CLINICAL DATA:  Short of breath  EXAM: CT ANGIOGRAPHY CHEST WITH CONTRAST  TECHNIQUE: Multidetector CT imaging of the chest was performed using the standard protocol during bolus administration of intravenous contrast. Multiplanar CT image reconstructions and MIPs were obtained to evaluate the vascular anatomy.   CONTRAST:  163mL OMNIPAQUE IOHEXOL 350 MG/ML SOLN  COMPARISON:  None.  FINDINGS: There are no filling defects in the pulmonary arterial tree to suggest acute pulmonary thromboembolism.  Abnormal mediastinal adenopathy is present. 13 mm AP window node. 9 mm AP window node. 12 mm precarinal node.  Small bilateral pleural effusions with dependent atelectasis. No pneumothorax.  There is consolidation or partial collapse of the right middle lobe. Centrilobular emphysema.  No acute bony deformity.  Review of the MIP images confirms the above findings.  IMPRESSION: No evidence of acute pulmonary thromboembolism.  Mild mediastinal adenopathy. An inflammatory process or neoplastic process are not excluded.  Small bilateral pleural effusions.  Dependent atelectasis.  Partial collapse of the right middle lobe.   Electronically Signed   By: Maryclare Bean M.D.   On: 02/06/2014 00:23   Dg Chest Port 1 View  02/05/2014   CLINICAL DATA:  Fever.  Pulmonary infiltrates.  EXAM: PORTABLE CHEST - 1 VIEW  COMPARISON:  02/04/2014  FINDINGS: Shallow inspiration with elevation of right hemidiaphragm similar to previous study. Linear atelectasis in both lung bases. Normal heart size and pulmonary vascularity. No significant consolidation. No pneumothorax. Degenerative changes in the shoulder and spine. Postoperative changes in the cervical spine.  IMPRESSION: Shallow inspiration with atelectasis in the lung bases.   Electronically Signed   By: Lucienne Capers M.D.   On: 02/05/2014 01:42    Scheduled Meds: . amLODipine  10 mg Oral Daily  . amoxicillin-clavulanate  1 tablet Oral Q12H  . budesonide (PULMICORT) nebulizer solution  0.25 mg Nebulization BID  . cholecalciferol  2,000 Units Oral Daily  . darifenacin  7.5 mg Oral Daily  . furosemide  20 mg Intravenous Q12H  . ipratropium-albuterol  3 mL Nebulization QID  . [START ON 02/07/2014] levothyroxine  100 mcg Oral QAC breakfast  . loratadine  10 mg Oral Daily  . magnesium oxide   400 mg Oral Daily  . metoprolol  200 mg Oral Daily  . multivitamin with minerals  1 tablet Oral Daily  . pantoprazole  40 mg Oral Daily  . potassium chloride SA  40 mEq Oral BID WC  . sodium chloride  3 mL Intravenous Q12H  . sodium chloride  3 mL Intravenous Q12H  . vitamin C  500 mg Oral Daily   Continuous Infusions:  Antibiotics Given (last 72 hours)   Date/Time Action Medication Dose Rate   02/05/14 0815 Given   ceFEPIme (MAXIPIME) 2 g in dextrose 5 % 50 mL IVPB 2 g 100 mL/hr   02/05/14 1954 Given   ceFEPIme (MAXIPIME) 2 g in dextrose 5 % 50 mL IVPB 2 g 100 mL/hr   02/05/14 2028 Given   vancomycin (VANCOCIN) IVPB 750 mg/150 ml premix 750 mg 150 mL/hr   02/06/14 [id number] Given   ceFEPIme (MAXIPIME) 2 g in dextrose 5 % 50 mL IVPB 2 g 100 mL/hr   02/06/14 1141 Given   amoxicillin-clavulanate (AUGMENTIN) 875-125 MG per tablet 1 tablet 1 tablet       Principal Problem:   Shortness of breath Active Problems:   Unspecified hypothyroidism   HTN (hypertension)   Anemia   SOB (shortness of breath)  Time spent: 36min    Linsay Vogt  [facility] Pager [phone]. If 7PM-7AM, please contact night-coverage at [email or web address], password [facility] 02/06/2014, 2:30 PM  LOS: 1 day

## 2014-02-06 NOTE — Progress Notes (Signed)
Received patient at 2300. Agree with previous assessment. Will continue to monitor pt. [place] Oborn C Charbel Los, RN  

## 2014-02-07 LAB — BASIC METABOLIC PANEL
BUN: 7 mg/dL (ref 6–23)
CO2: 34 mEq/L — ABNORMAL HIGH (ref 19–32)
Calcium: 8.3 mg/dL — ABNORMAL LOW (ref 8.4–10.5)
Chloride: 97 mEq/L (ref 96–112)
Creatinine, Ser: 1.02 mg/dL (ref 0.50–1.10)
GFR calc Af Amer: 59 mL/min — ABNORMAL LOW (ref >90)
GFR calc non Af Amer: 51 mL/min — ABNORMAL LOW (ref >90)
Glucose, Bld: 100 mg/dL — ABNORMAL HIGH (ref 70–99)
Potassium: 4.1 mEq/L (ref 3.7–5.3)
Sodium: 141 mEq/L (ref 137–147)

## 2014-02-07 LAB — CLOSTRIDIUM DIFFICILE BY PCR: Toxigenic C. Difficile by PCR: NEGATIVE

## 2014-02-07 LAB — CBC
HEMATOCRIT: 27 % — AB (ref 36.0–46.0)
Hemoglobin: 8.7 g/dL — ABNORMAL LOW (ref 12.0–15.0)
MCH: 19.9 pg — ABNORMAL LOW (ref 26.0–34.0)
MCHC: 32.2 g/dL (ref 30.0–36.0)
MCV: 61.8 fL — ABNORMAL LOW (ref 78.0–100.0)
Platelets: 310 10*3/uL (ref 150–400)
RBC: 4.37 MIL/uL (ref 3.87–5.11)
RDW: 14.7 % (ref 11.5–15.5)
WBC: 13.3 10*3/uL — ABNORMAL HIGH (ref 4.0–10.5)

## 2014-02-07 MED ORDER — LOPERAMIDE HCL 2 MG PO CAPS
2.0000 mg | ORAL_CAPSULE | Freq: Two times a day (BID) | ORAL | Status: DC
  Administered 2014-02-07 – 2014-02-08 (×2): 2 mg via ORAL
  Filled 2014-02-07 (×3): qty 1

## 2014-02-07 MED ORDER — FUROSEMIDE 20 MG PO TABS
20.0000 mg | ORAL_TABLET | Freq: Every day | ORAL | Status: DC
  Administered 2014-02-08: 20 mg via ORAL
  Filled 2014-02-07: qty 1

## 2014-02-07 MED ORDER — IPRATROPIUM-ALBUTEROL 0.5-2.5 (3) MG/3ML IN SOLN
3.0000 mL | Freq: Two times a day (BID) | RESPIRATORY_TRACT | Status: DC
  Administered 2014-02-07 – 2014-02-08 (×2): 3 mL via RESPIRATORY_TRACT
  Filled 2014-02-07 (×2): qty 3

## 2014-02-07 MED ORDER — IBUPROFEN 800 MG PO TABS
400.0000 mg | ORAL_TABLET | ORAL | Status: DC | PRN
  Administered 2014-02-07: 400 mg via ORAL
  Filled 2014-02-07: qty 1

## 2014-02-07 MED ORDER — FUROSEMIDE 40 MG PO TABS
40.0000 mg | ORAL_TABLET | Freq: Every day | ORAL | Status: DC
  Administered 2014-02-07: 40 mg via ORAL
  Filled 2014-02-07: qty 1

## 2014-02-07 NOTE — Progress Notes (Signed)
[facility] PROGRESS NOTE  Laura Frey ZOX:[id number] DOB: [birth year] DOA: 02/05/2014 PCP: No PCP Per Patient  Assessment/Plan: 1. Acute Diastolic CHF-Mild -mainly iatrogenic due to sepsis and IVF administration last admission -CTA negative for PE, small effusions noted -no pneumonia, will stop Vanc/cefepime -ECHO normal no systolic or diastolic CHF -I/Os inaccurate -changed to po lasix today -wean O2  2. ABd fluid collections-last week -s/p drain  -cultures sterile then -changed to PO Augmentin 4/23 for 1 more week   3. Elevated TSH -free T4 slightly low -increased synthroid to 112mcg  4. HTN -stable, continue norvasc and Toprol  5. Chronic anemia/h/o thalassemia -monitor  6. Hypokalemia -replaced  7. Diarrhea -r/o Cdiff, incontinence contributing too  DVT proph: lovenox  Code Status: Full Code per H&P Family Communication: d/w son at bedside DIspo: Home when improved  Antibiotics: Vanc/Cefepime  HPI/Subjective: breathing better, loose stools overnight and incontinent too  Objective: Filed Vitals:   02/07/14 1344  BP: 143/66  Pulse: 105  Temp: 98.6 F (37 C)  Resp: 18    Intake/Output Summary (Last 24 hours) at 02/07/14 1506 Last data filed at 02/07/14 1345  Gross per 24 hour  Intake    560 ml  Output    200 ml  Net    360 ml   Filed Weights   02/05/14 0035 02/06/14 0624  Weight: 84.369 kg (186 lb) 84.6 kg (186 lb 8.2 oz)    Exam:   General: AAOx3, no distress  Cardiovascular: S1S2/RRR  Respiratory: scattered ronchi  Abdomen: soft, Mildly distended, NT, Bs present  Ext: trace edema   Data Reviewed: Basic Metabolic Panel:  Recent Labs Lab 02/01/14 0420 02/03/14 0435 02/04/14 0500 02/05/14 0121 02/05/14 0509 02/07/14 0438  NA 142 141 143 139 142 141  K 3.1* 2.6* 3.7 3.2* 3.1* 4.1  CL 104 101 101 99 102 97  CO2 25 27 30 29 30  34*  GLUCOSE 100* 105* 108* 112* 118* 100*  BUN 10 8 5* 4* 5* 7  CREATININE 0.86 0.78  0.77 0.84 0.87 1.02  CALCIUM 8.0* 8.0* 8.1* 8.2* 8.0* 8.3*  MG  --  1.9  --   --   --   --    Liver Function Tests:  Recent Labs Lab 02/01/14 0420 02/03/14 0435 02/05/14 0121  AST 24 25 22   ALT 9 12 13   ALKPHOS 86 76 67  BILITOT 0.3 0.3 0.4  PROT 5.7* 5.9* 6.0  ALBUMIN 2.3* 2.3* 2.5*   No results found for this basename: LIPASE, AMYLASE,  in the last 168 hours No results found for this basename: AMMONIA,  in the last 168 hours CBC:  Recent Labs Lab 02/01/14 0420 02/03/14 0435 02/04/14 0418 02/05/14 0121 02/05/14 0509 02/07/14 0438  WBC 19.1* 18.8* 17.8* 13.6* 13.9* 13.3*  NEUTROABS 15.7* 14.2*  --  9.8*  --   --   HGB 8.8* 9.7* 9.6* 9.1* 9.5* 8.7*  HCT 26.3* 29.5* 29.1* 27.2* 29.9* 27.0*  MCV 61.7* 62.0* 62.3* 62.2* 63.8* 61.8*  PLT 421* 450* 428* 349 420* 310   Cardiac Enzymes:  Recent Labs Lab 02/05/14 0121  TROPONINI <0.30   BNP (last 3 results)  Recent Labs  02/05/14 0121  PROBNP 2497.0*   CBG: No results found for this basename: GLUCAP,  in the last 168 hours  Recent Results (from the past 240 hour(s))  BODY FLUID CULTURE     Status: None   Collection Time    01/29/14 12:43 PM  Result Value Ref Range Status   Specimen Description FLUID PELVIC   Final   Special Requests NONE   Final   Gram Stain     Final   Value: FEW WBC PRESENT,BOTH PMN AND MONONUCLEAR     NO ORGANISMS SEEN     Performed at [facility]   Culture     Final   Value: NO GROWTH 3 DAYS     Performed at [facility]   Report Status 02/01/2014 FINAL   Final  CLOSTRIDIUM DIFFICILE BY PCR     Status: None   Collection Time    02/07/14 12:30 PM      Result Value Ref Range Status   C difficile by pcr NEGATIVE  NEGATIVE Final   Comment: Performed at [facility]     Studies: Ct Angio Chest Pe W/cm &/or Wo Cm  02/06/2014   CLINICAL DATA:  Short of breath  EXAM: CT ANGIOGRAPHY CHEST WITH CONTRAST  TECHNIQUE: Multidetector CT imaging of the chest was  performed using the standard protocol during bolus administration of intravenous contrast. Multiplanar CT image reconstructions and MIPs were obtained to evaluate the vascular anatomy.  CONTRAST:  131mL OMNIPAQUE IOHEXOL 350 MG/ML SOLN  COMPARISON:  None.  FINDINGS: There are no filling defects in the pulmonary arterial tree to suggest acute pulmonary thromboembolism.  Abnormal mediastinal adenopathy is present. 13 mm AP window node. 9 mm AP window node. 12 mm precarinal node.  Small bilateral pleural effusions with dependent atelectasis. No pneumothorax.  There is consolidation or partial collapse of the right middle lobe. Centrilobular emphysema.  No acute bony deformity.  Review of the MIP images confirms the above findings.  IMPRESSION: No evidence of acute pulmonary thromboembolism.  Mild mediastinal adenopathy. An inflammatory process or neoplastic process are not excluded.  Small bilateral pleural effusions.  Dependent atelectasis.  Partial collapse of the right middle lobe.   Electronically Signed   By: Maryclare Bean M.D.   On: 02/06/2014 00:23    Scheduled Meds: . amLODipine  10 mg Oral Daily  . amoxicillin-clavulanate  1 tablet Oral Q12H  . budesonide (PULMICORT) nebulizer solution  0.25 mg Nebulization BID  . cholecalciferol  2,000 Units Oral Daily  . darifenacin  7.5 mg Oral Daily  . [START ON 02/08/2014] furosemide  20 mg Oral Daily  . ipratropium-albuterol  3 mL Nebulization BID  . levothyroxine  100 mcg Oral QAC breakfast  . loratadine  10 mg Oral Daily  . magnesium oxide  400 mg Oral Daily  . metoprolol  200 mg Oral Daily  . multivitamin with minerals  1 tablet Oral Daily  . pantoprazole  40 mg Oral Daily  . potassium chloride SA  40 mEq Oral BID WC  . sodium chloride  3 mL Intravenous Q12H  . sodium chloride  3 mL Intravenous Q12H  . vitamin C  500 mg Oral Daily   Continuous Infusions:  Antibiotics Given (last 72 hours)   Date/Time Action Medication Dose Rate   02/05/14 0815  Given   ceFEPIme (MAXIPIME) 2 g in dextrose 5 % 50 mL IVPB 2 g 100 mL/hr   02/05/14 1954 Given   ceFEPIme (MAXIPIME) 2 g in dextrose 5 % 50 mL IVPB 2 g 100 mL/hr   02/05/14 2028 Given   vancomycin (VANCOCIN) IVPB 750 mg/150 ml premix 750 mg 150 mL/hr   02/06/14 0724 Given   ceFEPIme (MAXIPIME) 2 g in dextrose 5 % 50 mL IVPB 2 g  100 mL/hr   02/06/14 1141 Given   amoxicillin-clavulanate (AUGMENTIN) 875-125 MG per tablet 1 tablet 1 tablet    02/06/14 2126 Given   amoxicillin-clavulanate (AUGMENTIN) 875-125 MG per tablet 1 tablet 1 tablet    02/07/14 1048 Given   amoxicillin-clavulanate (AUGMENTIN) 875-125 MG per tablet 1 tablet 1 tablet       Principal Problem:   Shortness of breath Active Problems:   Unspecified hypothyroidism   HTN (hypertension)   Anemia   SOB (shortness of breath)    Time spent: 31min    [facility] Pager [phone]. If 7PM-7AM, please contact night-coverage at [email or web address], password [facility] 02/07/2014, 3:06 PM  LOS: 2 days

## 2014-02-07 NOTE — Progress Notes (Signed)
Patient is a 80 year old female. She is alert and oriented x 4. She ambulates with supervision. Her pulse is equal on both the radial and pedal. Grip strength is even. Her skin is cold and dry. She wears glasses. She has dental implants. Her tongue and gums are pink and moist. She is currently on 1 liter of oxygen with an O2 reading of 93. She has an O2 reading of 86 on room air. She ambulated to the bathroom. She has full range of motion in her upper extremities. Her pupils are round and reactive to light. No discharge from eyes, nose, or ears. When asked if she knew why she was at the hospital, she had difficulty recalling why. Her hands are cold and caused a lower reading in O2.

## 2014-02-08 DIAGNOSIS — I509 Heart failure, unspecified: Secondary | ICD-10-CM

## 2014-02-08 DIAGNOSIS — I5031 Acute diastolic (congestive) heart failure: Secondary | ICD-10-CM | POA: Insufficient documentation

## 2014-02-08 LAB — BASIC METABOLIC PANEL
BUN: 8 mg/dL (ref 6–23)
CO2: 32 mEq/L (ref 19–32)
Calcium: 8.5 mg/dL (ref 8.4–10.5)
Chloride: 97 mEq/L (ref 96–112)
Creatinine, Ser: 1.01 mg/dL (ref 0.50–1.10)
GFR calc Af Amer: 60 mL/min — ABNORMAL LOW (ref >90)
GFR, EST NON AFRICAN AMERICAN: 52 mL/min — AB (ref >90)
Glucose, Bld: 96 mg/dL (ref 70–99)
Potassium: 4.4 mEq/L (ref 3.7–5.3)
SODIUM: 140 meq/L (ref 137–147)

## 2014-02-08 LAB — CBC
HCT: 27.8 % — ABNORMAL LOW (ref 36.0–46.0)
Hemoglobin: 9.1 g/dL — ABNORMAL LOW (ref 12.0–15.0)
MCH: 20.4 pg — AB (ref 26.0–34.0)
MCHC: 32.7 g/dL (ref 30.0–36.0)
MCV: 62.2 fL — ABNORMAL LOW (ref 78.0–100.0)
PLATELETS: 382 10*3/uL (ref 150–400)
RBC: 4.47 MIL/uL (ref 3.87–5.11)
RDW: 14.9 % (ref 11.5–15.5)
WBC: 12.6 10*3/uL — ABNORMAL HIGH (ref 4.0–10.5)

## 2014-02-08 MED ORDER — AMOXICILLIN-POT CLAVULANATE 875-125 MG PO TABS: 1.0000 | ORAL_TABLET | Freq: Two times a day (BID) | ORAL | Status: DC

## 2014-02-08 MED ORDER — LOPERAMIDE HCL 2 MG PO CAPS: 2.0000 mg | ORAL_CAPSULE | Freq: Two times a day (BID) | ORAL | Status: DC | PRN

## 2014-02-08 MED ORDER — FUROSEMIDE 20 MG PO TABS: 20.0000 mg | ORAL_TABLET | Freq: Every day | ORAL | Status: DC

## 2014-02-08 NOTE — Progress Notes (Signed)
Pt leaving hospital at this time with her son at her side. Pt d/c home. Discharge instructions/prescriptions given/explained with pt and son verbalizing understanding. Home O2 set-up with [facility]. Pt has oxygen tank to ride home with.

## 2014-02-08 NOTE — Care Management Note (Addendum)
MD order for HHPT/RN. As previously documented pt active with [facility]. Cm to notify agency of discharge orders. Pt does not qualify for home oxygen therapy due to pt's oxygen saturation 89% during ambulation on room air. Pt retested and oxygen saturation 86% on ra. [facility] notified. DME brought to bedside prior to discharge.   Venita Lick Daleyssa Loiselle,MSN,RN [phone]

## 2014-02-08 NOTE — Progress Notes (Signed)
SATURATION QUALIFICATIONS: (This note is used to comply with regulatory documentation for home oxygen)  Patient Saturations on Room Air at Rest = 86%  Patient Saturations on Room Air while Ambulating =  Patient Saturations on Liters of oxygen while Ambulating =   Please briefly explain why patient needs home oxygen:CHF

## 2014-02-08 NOTE — Discharge Summary (Signed)
Physician Discharge Summary  Laura Frey [id number] DOB: [birth year] DOA: 02/05/2014  PCP: No PCP Per Patient  Admit date: 02/05/2014 Discharge date: 02/08/2014  Time spent: 45 minutes  Recommendations for Outpatient Follow-up:  1. PCP in 1 week 2. Bmet in 1 week  Discharge Diagnoses:  Principal Problem:   Shortness of breath Active Problems:   Unspecified hypothyroidism   HTN (hypertension)   Anemia   SOB (shortness of breath)   Diastolic CHF, acute   Recent Sepsis   Recent Intra-abdominal fluid collections   Diarrhea   Incontinence  Discharge Condition: improved  Diet recommendation: low sodium  Filed Weights   02/05/14 0035 02/06/14 0624 02/08/14 0537  Weight: 84.369 kg (186 lb) 84.6 kg (186 lb 8.2 oz) 81.103 kg (178 lb 12.8 oz)    History of present illness:  HPI: Laura Frey is a 80 y.o. female who was discharged yesterday after having a complicated course with sepsis probably from pneumonia and abdominal abscess further completed by bowel obstruction was discharged home yesterday following which patient became short of breath and weak. Patient was taken to the ER at [facility]. Over there ER had done chest x-ray which showed right middle lobe infiltrates. Patient also was found to be hypoxic and was placed on oxygen. Patient was transferred to [facility]. Patient on my exam is mildly wheezing but denies any chest pain. Denies any abdominal pain nausea vomiting diarrhea. Complains of mild rectal pain. Chest x-ray done on the head does not show any definite infiltrates and labs show improvement in the leukocytosis. BNP is mildly elevated. Patient has been admitted for further management.      Hospital Course:  1. Acute Diastolic CHF-Mild -mainly iatrogenic due to sepsis and IVF administration last admission  -CTA negative for PE, small effusions noted  -no pneumonia, will stop Vanc/cefepime  -ECHO normal no systolic or diastolic CHF   -improved on IV lasix, transitioned to low dose PO lasix -home O2 at discharge  2. Abdominal  fluid collections-last week  -was followed by Surgery last admission, source s/p drain then -cultures sterile then  -was on broad spectrum antibiotics on admission, changed to PO Augmentin 4/23, continue for 3 more days to complete 2 weeks of antibiotics  3. Elevated TSH  -free T4 slightly low  -increased synthroid to 121mcg   4. HTN  -stable, continue norvasc and Toprol   5. Chronic anemia/h/o thalassemia  -stable  6. Hypokalemia  -replaced   7. Diarrhea/Incontinence  - Cdiff negative, incontinence contributing too  -Improved, imodium PRN   Procedures: ECHO Study Conclusions: - Left ventricle: Technically severely limited. Overall EF is normal The cavity size was normal. - Right ventricle: Poorly visualized. The cavity size was normal. Systolic function was normal.     Discharge Exam: Filed Vitals:   02/08/14 0537  BP: 145/62  Pulse: 100  Temp: 98.4 F (36.9 C)  Resp: 17    General: AAOx3 Cardiovascular:S1S2/RRR Respiratory: CTAB  Discharge Instructions You were cared for by a hospitalist during your hospital stay. If you have any questions about your discharge medications or the care you received while you were in the hospital after you are discharged, you can call the unit and asked to speak with the hospitalist on call if the hospitalist that took care of you is not available. Once you are discharged, your primary care physician will handle any further medical issues. Please note that NO REFILLS for any discharge medications will be authorized once  you are discharged, as it is imperative that you return to your primary care physician (or establish a relationship with a primary care physician if you do not have one) for your aftercare needs so that they can reassess your need for medications and monitor your lab values.  Discharge Orders   Future Orders Complete By  Expires   Diet - low sodium heart healthy  As directed    Increase activity slowly  As directed        Medication List         albuterol 108 (90 BASE) MCG/ACT inhaler  Commonly known as:  PROVENTIL HFA;VENTOLIN HFA  Inhale 2 puffs into the lungs every 6 (six) hours as needed for wheezing.     amLODipine 5 MG tablet  Commonly known as:  NORVASC  Take 2 tablets (10 mg total) by mouth daily.     amoxicillin-clavulanate 875-125 MG per tablet  Commonly known as:  AUGMENTIN  Take 1 tablet by mouth every 12 (twelve) hours. For 3 days     estrogens (conjugated) 0.45 MG tablet  Commonly known as:  PREMARIN  Take 0.45 mg by mouth daily.     fexofenadine 180 MG tablet  Commonly known as:  ALLEGRA  Take 180 mg by mouth daily.     furosemide 20 MG tablet  Commonly known as:  LASIX  Take 1 tablet (20 mg total) by mouth daily.     guaifenesin 100 MG/5ML syrup  Commonly known as:  ROBITUSSIN  Take 200 mg by mouth 3 (three) times daily as needed for cough.     levothyroxine 75 MCG tablet  Commonly known as:  SYNTHROID, LEVOTHROID  Take 75 mcg by mouth daily before breakfast.     loperamide 2 MG capsule  Commonly known as:  IMODIUM  Take 1 capsule (2 mg total) by mouth 2 (two) times daily as needed for diarrhea or loose stools.     magnesium oxide 400 MG tablet  Commonly known as:  MAG-OX  Take 400 mg by mouth daily.     metoprolol 200 MG 24 hr tablet  Commonly known as:  TOPROL-XL  Take 200 mg by mouth daily.     multivitamin with minerals Tabs tablet  Take 1 tablet by mouth daily.     omeprazole 20 MG capsule  Commonly known as:  PRILOSEC  Take 20 mg by mouth daily.     potassium chloride SA 20 MEQ tablet  Commonly known as:  K-DUR,KLOR-CON  Take 2 tablets (40 mEq total) by mouth 2 (two) times daily with a meal.     solifenacin 5 MG tablet  Commonly known as:  VESICARE  Take 5 mg by mouth daily.     vitamin C 500 MG tablet  Commonly known as:  ASCORBIC ACID  Take  500 mg by mouth daily.     Vitamin D 2000 UNITS tablet  Take 2,000 Units by mouth daily.     VITAMIN E PO  Take 500 Units by mouth daily.       Allergies  Allergen Reactions  . Hydrocodone     hallucinations  . Oxycodone     hallucinations  . Zoloft [Sertraline Hcl]     Burns stomach  . Ciprofloxacin Swelling  . Sulfa Antibiotics     Blisters in mouth  . Tylenol [Acetaminophen]     headaches  . Ultram [Tramadol]     headaches       Follow-up Information   Follow  up with PCP. Schedule an appointment as soon as possible for a visit in 1 week.       The results of significant diagnostics from this hospitalization (including imaging, microbiology, ancillary and laboratory) are listed below for reference.    Significant Diagnostic Studies: Dg Chest 2 View  01/28/2014   CLINICAL DATA:  Shortness of breath and cough.  History of melanoma.  EXAM: CHEST  2 VIEW  COMPARISON:  01/27/2014 and 04/29/2013  FINDINGS: Lungs are adequately inflated and demonstrate mild worsening consolidation over the right central mid to lower lung there is a small amount of pleural fluid posteriorly in the lung bases. There is suggestion mild vascular congestion. Cardiomediastinal silhouette and remainder of the exam is unchanged.  IMPRESSION: Worsening opacification of the central right mid to lower lung likely infection. Small amount of pleural fluid posteriorly. Suggestion mild vascular congestion.   Electronically Signed   By: Marin Olp M.D.   On: 01/28/2014 11:51   Ct Angio Chest Pe W/cm &/or Wo Cm  02/06/2014   CLINICAL DATA:  Short of breath  EXAM: CT ANGIOGRAPHY CHEST WITH CONTRAST  TECHNIQUE: Multidetector CT imaging of the chest was performed using the standard protocol during bolus administration of intravenous contrast. Multiplanar CT image reconstructions and MIPs were obtained to evaluate the vascular anatomy.  CONTRAST:  164mL OMNIPAQUE IOHEXOL 350 MG/ML SOLN  COMPARISON:  None.   FINDINGS: There are no filling defects in the pulmonary arterial tree to suggest acute pulmonary thromboembolism.  Abnormal mediastinal adenopathy is present. 13 mm AP window node. 9 mm AP window node. 12 mm precarinal node.  Small bilateral pleural effusions with dependent atelectasis. No pneumothorax.  There is consolidation or partial collapse of the right middle lobe. Centrilobular emphysema.  No acute bony deformity.  Review of the MIP images confirms the above findings.  IMPRESSION: No evidence of acute pulmonary thromboembolism.  Mild mediastinal adenopathy. An inflammatory process or neoplastic process are not excluded.  Small bilateral pleural effusions.  Dependent atelectasis.  Partial collapse of the right middle lobe.   Electronically Signed   By: Maryclare Bean M.D.   On: 02/06/2014 00:23   Ct Pelvis Wo Contrast  01/30/2014   CLINICAL DATA:  Pelvic abscess of unclear etiology. Rectal contrast given to evaluate for leak/fistula.  EXAM: CT PELVIS WITHOUT CONTRAST  TECHNIQUE: Multidetector CT imaging of the pelvis was performed following the standard protocol without intravenous contrast. Rectal contrast was administered via rectal catheter. Patient had difficulty keeping balloon catheter in the rectum.  COMPARISON:  01/28/2014  FINDINGS: Foley catheter is present within a collapsed bladder. Rectal catheter is in place with contrast filling the rectosigmoid colon and remainder of the visualized colon. There is no evidence of contrast extravasation/leak. B fluid collection just lateral and inferior to the sigmoid colon in the left pelvis is unchanged to slightly larger measuring 3 x 5.2 cm with less air within the fluid collection. There is a right gluteal pigtail drainage catheter with pigtail over the right pelvis as there has been resolution of the deep pelvic fluid collections in the region of the vaginal cuff and right perirectal region. There is a continued fluid collection with a few small air-fluid  levels over the right upper pelvis without significant change as this measures 4.3 x 4.9 cm. The pigtail drainage catheter is immediately posterior and slightly inferior to this persistent fluid collection. No evidence of free air. There are persistent air-filled mildly dilated small bowel loops likely secondary ileus to  this inflammatory/infectious process. There is moderate diverticulosis of the colon. Remainder the exam is unchanged.  IMPRESSION: Right gluteal pigtail drainage catheter in place with pigtail just right of midline and interval resolution of the fluid collections over the right perirectal region and vaginal cuff. Persistent fluid collection over the left pelvis inferior lateral to the adjacent sigmoid colon with slight increase in size measuring 3.1 x 5.2 cm. Stable fluid collection with a few small air-fluid levels over the right upper pelvis. The adjacent rectosigmoid colon is well opacified with contrast as there is no evidence of contrast extravasation/ leak or fistula.  Continued persistent dilated fluid and air-filled small bowel loops likely secondary ileus to the pelvic infectious/inflammatory process.  Diverticulosis of the colon.   Electronically Signed   By: Marin Olp M.D.   On: 01/30/2014 17:11   Ct Abdomen Pelvis W Contrast  01/28/2014   CLINICAL DATA:  Sepsis, abdominal pain leukocytosis  EXAM: CT ABDOMEN AND PELVIS WITH CONTRAST  TECHNIQUE: Multidetector CT imaging of the abdomen and pelvis was performed using the standard protocol following bolus administration of intravenous contrast.  CONTRAST:  165mL OMNIPAQUE IOHEXOL 300 MG/ML  SOLN  COMPARISON:  Not available  FINDINGS: Bilateral pleural effusions and mild basilar atelectasis. No pericardial fluid.  No focal hepatic lesion. The gallbladder, pancreas, spleen, adrenal glands, kidneys are normal.  The stomach is normal. There is small hiatal hernia. The proximal small bowel is mildly dilated to 3 cm. There is poor  progression of the oral contrast. The more distal small bowel leading to the terminal ileum is decompressed compared to the proximal small bowel measuring approximately 1 cm diameter. No cleartransition point is identified.  Within the deep pelvisthere are 2 adjacent ovoid fluid collections with rim enhancement positioned posterior to the bladder and anterior to the rectum measuring 3.4 x 2.2 cm and 2.4 x 2.6 cm. These are felt to represent small abscess rather than loops of bowel or ovaries. Posterior within the deep pelvis a second fluid collection measuring 3.5 x 3.3 cm in the right aspect the posterior cul-de-sac.  There is small amount of gas along the left broad ligament (image 58). Second focus of gas in the region of the right adnexa/broad ligament. (image 60). There several diverticula of the sigmoid colon pelvis appears relatively normal.  Abdominal or is normal caliber. No retroperitoneal periportal lymphadenopathy.  Bladder appears normal. No evidence of gas bladder. Post hysterectomy anatomy. The rectum appears normal.  There is no free air in the upper abdomen. Tiny focus of gas in the umbilicus.  IMPRESSION: 1. Inflammatory process in the pelvis. There are 3 fluid collections with enhancing rims which appear to represent abscesses located between the rectum and the bladder. These could could relate to prior hysterectomy and potential dehiscence of vaginal cuff. Diverticulitis would be a secondary less likely consideration. 2. Small amount of gas and fluid collections in left and right broad ligament / adnexal regions. These are also likely infections associated with same process. 3. Evidence of partial small bowel obstruction versus ileus. This likely relates to secondary inflammation of the small bowel as it dips into the pelvis. 4. Bilateral pleural effusions and basilar atelectasis. Findings conveyed topatient's ICU nurseon 01/28/2014  at16:31.   Electronically Signed   By: Suzy Bouchard M.D.    On: 01/28/2014 16:34   Dg Cystogram  01/30/2014   CLINICAL DATA:  Pelvic abscesses.  EXAM: CYSTOGRAM  TECHNIQUE: After catheterization of the urinary bladder following sterile technique the bladder  was filled with 300 mL Cysto-Hypaque 30% by drip infusion. Serial spot images were obtained during bladder filling and post draining.  FLUOROSCOPY TIME:  2 min 9 seconds slow pulsed fluoroscopy adjusted for body habitus  COMPARISON:  CT scans dated 04/14 and 01/30/2014  FINDINGS: Contrast was instilled through the indwelling Foley catheter. Contrast is seen in the colon from the previous CT scan. I placed the patient in the lateral projection and filled the bladder. Multiple radiographs were then obtained in multiple projections which demonstrate no evidence of extravasation of contrast. No visible fistula to the bowel.  Abscess drainage catheter is noted. Post drainage radiograph demonstrates no contrast in the abscess drainage catheter.  IMPRESSION: Normal cystogram.   Electronically Signed   By: Rozetta Nunnery M.D.   On: 01/30/2014 17:45   Dg Chest Port 1 View  02/05/2014   CLINICAL DATA:  Fever.  Pulmonary infiltrates.  EXAM: PORTABLE CHEST - 1 VIEW  COMPARISON:  02/04/2014  FINDINGS: Shallow inspiration with elevation of right hemidiaphragm similar to previous study. Linear atelectasis in both lung bases. Normal heart size and pulmonary vascularity. No significant consolidation. No pneumothorax. Degenerative changes in the shoulder and spine. Postoperative changes in the cervical spine.  IMPRESSION: Shallow inspiration with atelectasis in the lung bases.   Electronically Signed   By: Lucienne Capers M.D.   On: 02/05/2014 01:42   Dg Chest Port 1 View  01/29/2014   CLINICAL DATA:  Shortness of breath and weakness.  EXAM: PORTABLE CHEST - 1 VIEW  COMPARISON:  01/28/2014.  FINDINGS: The cardiac silhouette, mediastinal and hilar contours are stable. Slightly lower lung volumes with vascular crowding and  atelectasis. The right lower lobe process appears slightly worse but this is largely due to the lower lung volumes. No pneumothorax or pleural effusion.  IMPRESSION: Lower lung volumes with increase in vascular crowding and bibasilar atelectasis, right greater than left.   Electronically Signed   By: Kalman Jewels M.D.   On: 01/29/2014 19:21   Dg Abd 2 Views  02/01/2014   CLINICAL DATA:  Abdominal pain and distention  EXAM: ABDOMEN - 2 VIEW  COMPARISON:  January 30, 2014  FINDINGS: Supine and upright images were obtained. A drainage catheter remains in the right pelvis region. Nasogastric tube tip is in the proximal duodenum. There remains several loops of dilated small bowel. Contrast is seen in a nondilated colon. There are multiple sigmoid diverticula. No free air. There is a phlebolith in left pelvis.  IMPRESSION: Residual bowel dilatation without appreciable air-fluid levels. Suspect residual partial bowel obstruction. Contrast is seen in a nondistended colon.  Multiple colonic diverticula.  Nasogastric tube tip in proximal duodenum. Drainage catheter and right pelvis again noted. No free air.   Electronically Signed   By: Lowella Grip M.D.   On: 02/01/2014 14:23   Dg Abd Acute W/chest  01/30/2014   CLINICAL DATA:  Shortness of breath.  Diffuse abdominal pain.  EXAM: ACUTE ABDOMEN SERIES (ABDOMEN 2 VIEW & CHEST 1 VIEW)  COMPARISON:  Chest x-ray dated 01/29/2014 and CT scan dated 01/28/2014  FINDINGS: Right middle lobe atelectasis has improved. There is still slight pulmonary vascular congestion. Slight linear atelectasis at the left lung base. Tiny bilateral effusions.  No free air in the abdomen. Proximal small bowel remains dilated. Contrast has passed into the nondistended colon. Abscess drainage catheter present in the right side of the pelvis.  IMPRESSION: Persistent pattern of partial small bowel obstruction. Contrast has moved into the nondistended colon.  Electronically Signed   By: Rozetta Nunnery M.D.   On: 01/30/2014 13:30   Ct Image Guided Drainage Percut Cath  Peritoneal Retroperit  01/29/2014   CLINICAL DATA:  Pelvic abscess  EXAM: CT IMAGE GUIDED DRAINAGE PERCUT CATH  PERITONEAL RETROPERIT  FLUOROSCOPY TIME:  None minutes  MEDICATIONS AND MEDICAL HISTORY: Versed 1 mg, Fentanyl 50 mcg.  Additional Medications: None.  ANESTHESIA/SEDATION: Moderate sedation time: 20 minutes  CONTRAST:  None  PROCEDURE: The procedure, risks, benefits, and alternatives were explained to the patient. Questions regarding the procedure were encouraged and answered. The patient understands and consents to the procedure.  The right gluteal region in the prone position was prepped with Betadine in a sterile fashion, and a sterile drape was applied covering the operative field. A sterile gown and sterile gloves were used for the procedure.  Under CT guidance, an 18 gauge needle was inserted into the pelvic abscess via a right trans gluteal approach. It was removed over an Amplatz wire. A 12 French dilator followed by a 12 [place] drain were inserted. It was looped and string fixed in the fluid collection. Slightly cloudy yellow fluid was aspirated.  FINDINGS: Images document 4 French drain placement into a pelvic abscess via right trans gluteal approach  COMPLICATIONS: None  IMPRESSION: Successful right trans gluteal pelvic abscess drainage.   Electronically Signed   By: Maryclare Bean M.D.   On: 01/29/2014 13:33    Microbiology: Recent Results (from the past 240 hour(s))  BODY FLUID CULTURE     Status: None   Collection Time    01/29/14 12:43 PM      Result Value Ref Range Status   Specimen Description FLUID PELVIC   Final   Special Requests NONE   Final   Gram Stain     Final   Value: FEW WBC PRESENT,BOTH PMN AND MONONUCLEAR     NO ORGANISMS SEEN     Performed at [facility]   Culture     Final   Value: NO GROWTH 3 DAYS     Performed at [facility]   Report Status 02/01/2014 FINAL   Final   CLOSTRIDIUM DIFFICILE BY PCR     Status: None   Collection Time    02/07/14 12:30 PM      Result Value Ref Range Status   C difficile by pcr NEGATIVE  NEGATIVE Final   Comment: Performed at [facility]: Basic Metabolic Panel:  Recent Labs Lab 02/03/14 0435 02/04/14 0500 02/05/14 0121 02/05/14 0509 02/07/14 0438 02/08/14 0500  NA 141 143 139 142 141 140  K 2.6* 3.7 3.2* 3.1* 4.1 4.4  CL 101 101 99 102 97 97  CO2 27 30 29 30  34* 32  GLUCOSE 105* 108* 112* 118* 100* 96  BUN 8 5* 4* 5* 7 8  CREATININE 0.78 0.77 0.84 0.87 1.02 1.01  CALCIUM 8.0* 8.1* 8.2* 8.0* 8.3* 8.5  MG 1.9  --   --   --   --   --    Liver Function Tests:  Recent Labs Lab 02/03/14 0435 02/05/14 0121  AST 25 22  ALT 12 13  ALKPHOS 76 67  BILITOT 0.3 0.4  PROT 5.9* 6.0  ALBUMIN 2.3* 2.5*   No results found for this basename: LIPASE, AMYLASE,  in the last 168 hours No results found for this basename: AMMONIA,  in the last 168 hours CBC:  Recent Labs Lab 02/03/14 0435 02/04/14  RC:[id number] 02/05/14 0121 02/05/14 0509 02/07/14 0438 02/08/14 0500  WBC 18.8* 17.8* 13.6* 13.9* 13.3* 12.6*  NEUTROABS 14.2*  --  9.8*  --   --   --   HGB 9.7* 9.6* 9.1* 9.5* 8.7* 9.1*  HCT 29.5* 29.1* 27.2* 29.9* 27.0* 27.8*  MCV 62.0* 62.3* 62.2* 63.8* 61.8* 62.2*  PLT 450* 428* 349 420* 310 382   Cardiac Enzymes:  Recent Labs Lab 02/05/14 0121  TROPONINI <0.30   BNP: BNP (last 3 results)  Recent Labs  02/05/14 0121  PROBNP 2497.0*   CBG: No results found for this basename: GLUCAP,  in the last 168 hours     Signed:  [facility] 02/08/2014, 11:31 AM

## 2014-02-09 LAB — CULTURE, BLOOD (SINGLE)

## 2014-02-27 DIAGNOSIS — D649 Anemia, unspecified: Secondary | ICD-10-CM | POA: Insufficient documentation

## 2014-02-27 DIAGNOSIS — R0902 Hypoxemia: Secondary | ICD-10-CM | POA: Insufficient documentation

## 2014-02-27 DIAGNOSIS — N390 Urinary tract infection, site not specified: Secondary | ICD-10-CM | POA: Insufficient documentation

## 2014-12-16 ENCOUNTER — Ambulatory Visit: Payer: Self-pay | Admitting: Physician Assistant

## 2015-02-06 NOTE — Consult Note (Signed)
PATIENT NAME:  Laura Frey, Laura Frey MR#:  [id number] DATE OF BIRTH:  [birth year]  DATE OF CONSULTATION:  02/28/2013  CONSULTING PHYSICIAN:  Markiya Keefe A. Dossie Arbour, MD  NOTE: The patient continued to have an upper back pain after the cervical epidural steroid injection and I made the decision to start an IV and provide her with some fentanyl and Versed in order to relax her and determine if this was a more serious problems. She indicated that she was still having the pain or pressure in the upper back and going down both arms. In view of this, the decision was made to send the patient to have an emergency MRI of the cervical spine. The results of the MRI confirmed that the patient had no epidural hematoma or any other serious complication. However, upon personally inspecting the films, it is clear to me that the patient has some severe cervical spinal stenosis at the upper levels between the C3-4 and C4-5 levels. This is pending to be confirmed by the official report. In view of this, I decided to get the patient a consult with a neurosurgeon for further evaluation. Later on, I did get a copy of the official report, which again confirmed no cervical epidural hematomas or evidence of bleeding or any other complications from the procedure.     ____________________________ Beatriz Chancellor A. Dossie Arbour, MD fan:dmm D: 02/28/2013 16:46:00 ET T: 02/28/2013 17:18:07 ET JOB#: [id number]  cc: Berel Najjar A. Dossie Arbour, MD, <Dictator> Gaspar Cola MD ELECTRONICALLY SIGNED 02/28/2013 18:03

## 2016-03-17 ENCOUNTER — Other Ambulatory Visit: Payer: Self-pay | Admitting: Physician Assistant

## 2016-03-17 DIAGNOSIS — Z1231 Encounter for screening mammogram for malignant neoplasm of breast: Secondary | ICD-10-CM

## 2016-04-06 ENCOUNTER — Ambulatory Visit: Payer: Self-pay | Attending: Physician Assistant

## 2017-10-03 ENCOUNTER — Other Ambulatory Visit: Payer: Self-pay | Admitting: Orthopedic Surgery

## 2017-10-03 DIAGNOSIS — S32020A Wedge compression fracture of second lumbar vertebra, initial encounter for closed fracture: Secondary | ICD-10-CM

## 2017-10-12 ENCOUNTER — Ambulatory Visit
Admission: RE | Admit: 2017-10-12 | Discharge: 2017-10-12 | Disposition: A | Discharge status: Home / Self Care | Payer: Medicare Other | Source: Ambulatory Visit | Attending: Orthopedic Surgery | Admitting: Orthopedic Surgery

## 2017-10-12 DIAGNOSIS — X58XXXA Exposure to other specified factors, initial encounter: Secondary | ICD-10-CM | POA: Insufficient documentation

## 2017-10-12 DIAGNOSIS — S32020A Wedge compression fracture of second lumbar vertebra, initial encounter for closed fracture: Secondary | ICD-10-CM | POA: Diagnosis not present

## 2017-10-12 DIAGNOSIS — M4807 Spinal stenosis, lumbosacral region: Secondary | ICD-10-CM | POA: Diagnosis not present

## 2017-10-12 DIAGNOSIS — M48061 Spinal stenosis, lumbar region without neurogenic claudication: Secondary | ICD-10-CM | POA: Diagnosis not present

## 2017-11-16 ENCOUNTER — Encounter: Payer: Self-pay | Admitting: Pain Medicine

## 2017-11-16 ENCOUNTER — Other Ambulatory Visit: Payer: Self-pay

## 2017-11-16 ENCOUNTER — Ambulatory Visit
Admission: RE | Admit: 2017-11-16 | Discharge: 2017-11-16 | Disposition: A | Discharge status: Home / Self Care | Payer: Medicare Other | Source: Ambulatory Visit | Attending: Pain Medicine | Admitting: Pain Medicine

## 2017-11-16 ENCOUNTER — Ambulatory Visit (HOSPITAL_BASED_OUTPATIENT_CLINIC_OR_DEPARTMENT_OTHER): Payer: Medicare Other | Admitting: Pain Medicine

## 2017-11-16 VITALS — BP 146/68 | HR 76 | Temp 98.0°F | Resp 16 | Ht 62.0 in | Wt 177.0 lb

## 2017-11-16 DIAGNOSIS — M48061 Spinal stenosis, lumbar region without neurogenic claudication: Secondary | ICD-10-CM | POA: Insufficient documentation

## 2017-11-16 DIAGNOSIS — Z9889 Other specified postprocedural states: Secondary | ICD-10-CM | POA: Insufficient documentation

## 2017-11-16 DIAGNOSIS — D569 Thalassemia, unspecified: Secondary | ICD-10-CM | POA: Insufficient documentation

## 2017-11-16 DIAGNOSIS — Z886 Allergy status to analgesic agent status: Secondary | ICD-10-CM | POA: Diagnosis not present

## 2017-11-16 DIAGNOSIS — M545 Low back pain: Secondary | ICD-10-CM | POA: Diagnosis present

## 2017-11-16 DIAGNOSIS — M5416 Radiculopathy, lumbar region: Secondary | ICD-10-CM | POA: Diagnosis not present

## 2017-11-16 DIAGNOSIS — M4726 Other spondylosis with radiculopathy, lumbar region: Secondary | ICD-10-CM | POA: Diagnosis not present

## 2017-11-16 DIAGNOSIS — M5116 Intervertebral disc disorders with radiculopathy, lumbar region: Secondary | ICD-10-CM | POA: Insufficient documentation

## 2017-11-16 DIAGNOSIS — Z881 Allergy status to other antibiotic agents status: Secondary | ICD-10-CM | POA: Insufficient documentation

## 2017-11-16 DIAGNOSIS — E039 Hypothyroidism, unspecified: Secondary | ICD-10-CM | POA: Insufficient documentation

## 2017-11-16 DIAGNOSIS — M48062 Spinal stenosis, lumbar region with neurogenic claudication: Secondary | ICD-10-CM

## 2017-11-16 DIAGNOSIS — G894 Chronic pain syndrome: Secondary | ICD-10-CM | POA: Insufficient documentation

## 2017-11-16 DIAGNOSIS — R937 Abnormal findings on diagnostic imaging of other parts of musculoskeletal system: Secondary | ICD-10-CM | POA: Insufficient documentation

## 2017-11-16 DIAGNOSIS — M5136 Other intervertebral disc degeneration, lumbar region: Secondary | ICD-10-CM | POA: Insufficient documentation

## 2017-11-16 DIAGNOSIS — I130 Hypertensive heart and chronic kidney disease with heart failure and stage 1 through stage 4 chronic kidney disease, or unspecified chronic kidney disease: Secondary | ICD-10-CM | POA: Insufficient documentation

## 2017-11-16 DIAGNOSIS — M4807 Spinal stenosis, lumbosacral region: Secondary | ICD-10-CM | POA: Diagnosis not present

## 2017-11-16 DIAGNOSIS — N189 Chronic kidney disease, unspecified: Secondary | ICD-10-CM | POA: Diagnosis not present

## 2017-11-16 DIAGNOSIS — Z888 Allergy status to other drugs, medicaments and biological substances status: Secondary | ICD-10-CM | POA: Diagnosis not present

## 2017-11-16 DIAGNOSIS — Z90710 Acquired absence of both cervix and uterus: Secondary | ICD-10-CM | POA: Diagnosis not present

## 2017-11-16 DIAGNOSIS — M4856XS Collapsed vertebra, not elsewhere classified, lumbar region, sequela of fracture: Secondary | ICD-10-CM

## 2017-11-16 DIAGNOSIS — M47812 Spondylosis without myelopathy or radiculopathy, cervical region: Secondary | ICD-10-CM | POA: Insufficient documentation

## 2017-11-16 DIAGNOSIS — M51369 Other intervertebral disc degeneration, lumbar region without mention of lumbar back pain or lower extremity pain: Secondary | ICD-10-CM | POA: Insufficient documentation

## 2017-11-16 DIAGNOSIS — M5030 Other cervical disc degeneration, unspecified cervical region: Secondary | ICD-10-CM | POA: Insufficient documentation

## 2017-11-16 DIAGNOSIS — Z885 Allergy status to narcotic agent status: Secondary | ICD-10-CM | POA: Diagnosis not present

## 2017-11-16 DIAGNOSIS — K219 Gastro-esophageal reflux disease without esophagitis: Secondary | ICD-10-CM | POA: Insufficient documentation

## 2017-11-16 DIAGNOSIS — M79605 Pain in left leg: Secondary | ICD-10-CM

## 2017-11-16 DIAGNOSIS — M5442 Lumbago with sciatica, left side: Secondary | ICD-10-CM

## 2017-11-16 DIAGNOSIS — M4856XA Collapsed vertebra, not elsewhere classified, lumbar region, initial encounter for fracture: Secondary | ICD-10-CM | POA: Insufficient documentation

## 2017-11-16 DIAGNOSIS — M159 Polyosteoarthritis, unspecified: Secondary | ICD-10-CM | POA: Insufficient documentation

## 2017-11-16 DIAGNOSIS — M4310 Spondylolisthesis, site unspecified: Secondary | ICD-10-CM | POA: Insufficient documentation

## 2017-11-16 DIAGNOSIS — Z9851 Tubal ligation status: Secondary | ICD-10-CM | POA: Diagnosis not present

## 2017-11-16 DIAGNOSIS — M47816 Spondylosis without myelopathy or radiculopathy, lumbar region: Secondary | ICD-10-CM | POA: Insufficient documentation

## 2017-11-16 DIAGNOSIS — J449 Chronic obstructive pulmonary disease, unspecified: Secondary | ICD-10-CM | POA: Insufficient documentation

## 2017-11-16 DIAGNOSIS — Z87891 Personal history of nicotine dependence: Secondary | ICD-10-CM | POA: Diagnosis not present

## 2017-11-16 DIAGNOSIS — M5441 Lumbago with sciatica, right side: Secondary | ICD-10-CM

## 2017-11-16 DIAGNOSIS — Z79899 Other long term (current) drug therapy: Secondary | ICD-10-CM | POA: Insufficient documentation

## 2017-11-16 DIAGNOSIS — I10 Essential (primary) hypertension: Secondary | ICD-10-CM | POA: Insufficient documentation

## 2017-11-16 DIAGNOSIS — M150 Primary generalized (osteo)arthritis: Secondary | ICD-10-CM

## 2017-11-16 DIAGNOSIS — F329 Major depressive disorder, single episode, unspecified: Secondary | ICD-10-CM | POA: Insufficient documentation

## 2017-11-16 DIAGNOSIS — G8929 Other chronic pain: Secondary | ICD-10-CM

## 2017-11-16 DIAGNOSIS — M5410 Radiculopathy, site unspecified: Secondary | ICD-10-CM

## 2017-11-16 DIAGNOSIS — M79604 Pain in right leg: Secondary | ICD-10-CM

## 2017-11-16 DIAGNOSIS — Z882 Allergy status to sulfonamides status: Secondary | ICD-10-CM | POA: Diagnosis not present

## 2017-11-16 MED ORDER — SODIUM CHLORIDE 0.9% FLUSH
2.0000 mL | Freq: Once | INTRAVENOUS | Status: AC
  Administered 2017-11-16: 2 mL

## 2017-11-16 MED ORDER — TRIAMCINOLONE ACETONIDE 40 MG/ML IJ SUSP
40.0000 mg | Freq: Once | INTRAMUSCULAR | Status: AC
  Administered 2017-11-16: 40 mg
  Filled 2017-11-16: qty 1

## 2017-11-16 MED ORDER — IOPAMIDOL (ISOVUE-M 200) INJECTION 41%
10.0000 mL | Freq: Once | INTRAMUSCULAR | Status: AC
  Administered 2017-11-16: 10 mL via EPIDURAL
  Filled 2017-11-16: qty 10

## 2017-11-16 MED ORDER — ROPIVACAINE HCL 2 MG/ML IJ SOLN
2.0000 mL | Freq: Once | INTRAMUSCULAR | Status: AC
  Administered 2017-11-16: 2 mL via EPIDURAL
  Filled 2017-11-16: qty 10

## 2017-11-16 MED ORDER — LIDOCAINE HCL 2 % IJ SOLN
10.0000 mL | Freq: Once | INTRAMUSCULAR | Status: AC
Start: 2017-11-16 — End: 2017-11-16
  Administered 2017-11-16: 200 mg
  Filled 2017-11-16: qty 40

## 2017-11-16 NOTE — Progress Notes (Signed)
Patient's Name: Laura Frey  MRN: [id number]  Referring Provider: Duanne Guess, [place]  DOB: Jun 01, 1932  PCP: Marinda Elk, MD  DOS: 11/16/2017  Note by: Gaspar Cola, MD  Service setting: Ambulatory outpatient  Specialty: Interventional Pain Management  Location: [facility] (AMB) Pain Management Facility    Patient type: New patient ("FAST-TRACK" Evaluation)   Warning: This referral option does not include the extensive pharmacological evaluation required for [place] to take over the patient's medication management. The "Fast-Track" system is designed to bypass the new patient referral waiting list, as well as the normal patient evaluation process, in order to provide a patient in distress with a timely pain management intervention. Because the system was not designed to unfairly get a patient into our pain practice ahead of those already waiting, certain restrictions apply. By requesting a "Fast-Track" consult, the referring physician has opted to continue managing the patient's medications in order to get interventional urgent care.  Procedure:  Anesthesia, Analgesia, Anxiolysis:  Type: Therapeutic Inter-Laminar Epidural Steroid Injection #1  Region: Lumbar Level: L3-4 Level. Laterality: Right Paramedial  Type: Local Anesthesia Local Anesthetic: Lidocaine 1% Route: Infiltration (Mullen/IM) IV Access: Declined Sedation: Declined  Indication(s): Analgesia           Indications: 1. Chronic lumbar radiculitis (Right)   2. Chronic lower extremity pain (Primary Area of Pain) (Bilateral) (R>L)   3. Chronic lower extremity radicular pain (Bilateral) (R>L)   4. L2 compression fracture   5. Lumbar central spinal stenosis   6. Lumbar lateral recess stenosis (Bilateral)   7. Lumbar spondylosis    Pain Score: Pre-procedure: 8 /10 Post-procedure: 0 /10  HPI  Laura Frey is a 84 y.o. year old, female patient, who comes today for a  "Fast-Track" new patient evaluation, as requested by Duanne Guess, PA-C. The patient has been made aware that this type of referral option is reserved for the Interventional Pain Management portion of our practice and completely excludes the option of medication management. Her primarily concern today is the Back Pain (low)  Pain Assessment: Location: Lower, Right, Left(right is worse) Back Radiating: radiates right leg to toes Onset: More than a month ago Duration: Chronic pain Quality: Sharp Severity: 8 /10 (self-reported pain score)  Note: Reported level is inconsistent with clinical observations. Clinically the patient looks like a 5/10 A 5/10 is viewed as "Severe" and described as intense and extremely unpleasant. Associated with frowning face and frequent crying. Pain overwhelms the senses.  Ability to do any activity or maintain social relationships becomes significantly limited. Conversation becomes difficult. Pacing back and forth is common, as getting into a comfortable position is nearly impossible. Pain wakes you up from deep sleep. Physical signs will be obvious: pupillary dilation; increased sweating; goosebumps; brisk reflexes; cold, clammy hands and feet; nausea, vomiting or dry heaves; loss of appetite; significant sleep disturbance with inability to fall asleep or to remain asleep. When persistent, significant weight loss is observed due to the complete loss of appetite and sleep deprivation.  Blood pressure and heart rate becomes significantly elevated. Information on the proper use of the pain scale provided to the patient today. When using our objective Pain Scale, levels between 6 and 10/10 are said to belong in an emergency room, as it progressively worsens from a 6/10, described as severely limiting, requiring emergency care not usually available at an outpatient pain management facility. At a 6/10 level, communication becomes difficult and requires great effort. Assistance to reach the emergency department  may be required. Facial flushing  and profuse sweating along with potentially dangerous increases in heart rate and blood pressure will be evident. Timing: Intermittent Modifying factors: sitting  Cause of pain: Trauma Severity: Getting worse, NAS-11 at its worse: 9/10, NAS-11 at its best: 6/10, NAS-11 now: 7/10 and NAS-11 on the average: 7/10 Timing: Morning and During activity or exercise Aggravating Factors: Bending, Kneeling, Motion, Prolonged standing, Squatting, Twisting, Walking, Walking uphill and Walking downhill Alleviating Factors: Hot packs, Lying down, Medications, Resting, Sitting and Warm showers or baths Associated Problems: Constipation, Inability to control bladder (urine), Numbness and Weakness Quality of Pain: Aching, Agonizing, Annoying, Intermittent, Cramping, Distressing, Throbbing and Uncomfortable Previous Examinations or Tests: MRI scan, X-rays and Neurosurgical evaluation Previous Treatments: Physical Therapy  The patient comes into the clinics today, referred to [place] for a lumbar epidural steroid injection  Meds   Current Outpatient Medications:  .  albuterol (PROVENTIL HFA;VENTOLIN HFA) 108 (90 BASE) MCG/ACT inhaler, Inhale 2 puffs into the lungs every 6 (six) hours as needed for wheezing., Disp: , Rfl:  .  amLODipine (NORVASC) 5 MG tablet, Take 2 tablets (10 mg total) by mouth daily., Disp: 60 tablet, Rfl: 0 .  amoxicillin-clavulanate (AUGMENTIN) 875-125 MG per tablet, Take 1 tablet by mouth every 12 (twelve) hours. For 3 days, Disp: 6 tablet, Rfl: 0 .  Cholecalciferol (VITAMIN D) 2000 UNITS tablet, Take 2,000 Units by mouth daily., Disp: , Rfl:  .  fexofenadine (ALLEGRA) 180 MG tablet, Take 180 mg by mouth daily., Disp: , Rfl:  .  furosemide (LASIX) 20 MG tablet, Take 1 tablet (20 mg total) by mouth daily., Disp: 30 tablet, Rfl: 0 .  guaifenesin (ROBITUSSIN) 100 MG/5ML syrup, Take 200 mg by mouth 3 (three) times daily as needed for cough., Disp: , Rfl:  .  levothyroxine (SYNTHROID, LEVOTHROID)  75 MCG tablet, Take 75 mcg by mouth daily before breakfast., Disp: , Rfl:  .  loperamide (IMODIUM) 2 MG capsule, Take 1 capsule (2 mg total) by mouth 2 (two) times daily as needed for diarrhea or loose stools., Disp: 20 capsule, Rfl: 0 .  magnesium oxide (MAG-OX) 400 MG tablet, Take 400 mg by mouth daily., Disp: , Rfl:  .  metoprolol (TOPROL-XL) 200 MG 24 hr tablet, Take 200 mg by mouth daily., Disp: , Rfl:  .  Multiple Vitamin (MULTIVITAMIN WITH MINERALS) TABS, Take 1 tablet by mouth daily., Disp: , Rfl:  .  omeprazole (PRILOSEC) 20 MG capsule, Take 20 mg by mouth daily., Disp: , Rfl:  .  potassium chloride SA (K-DUR,KLOR-CON) 20 MEQ tablet, Take 2 tablets (40 mEq total) by mouth 2 (two) times daily with a meal., Disp: 60 tablet, Rfl: 0 .  solifenacin (VESICARE) 5 MG tablet, Take 5 mg by mouth daily., Disp: , Rfl:  .  vitamin C (ASCORBIC ACID) 500 MG tablet, Take 500 mg by mouth daily., Disp: , Rfl:  .  VITAMIN E PO, Take 500 Units by mouth daily., Disp: , Rfl:  .  estrogens, conjugated, (PREMARIN) 0.45 MG tablet, Take 0.45 mg by mouth daily. , Disp: , Rfl:   Imaging Review  Cervical Imaging: Cervical DG 2-3 views:  Results for orders placed during the hospital encounter of 05/01/13  DG Cervical Spine 2-3 Views   Narrative *RADIOLOGY REPORT*  Clinical Data: C4-C7 ACDF.  DG C-ARM 1-60 MIN,CERVICAL SPINE - 2-3 VIEW  Comparison: Cervical MRI 02/28/2013.  Findings: Two lateral spot fluoroscopic images of the cervical spine demonstrate anterior discectomy and fusion from C4-C7  with an anterior plate and screws.  Detail is limited by overlap with the shoulders, although no complications are identified.  The alignment is normal.  There are surgical sponges anteriorly within the surgical bed.  IMPRESSION: No demonstrate complication following Z6-X0 ACDF.   Original Report Authenticated By: Richardean Sale, M.D.    Lumbosacral Imaging: Lumbar MR wo contrast:  Results for orders placed  during the hospital encounter of 10/12/17  MR LUMBAR SPINE WO CONTRAST   Narrative CLINICAL DATA:  Closed wedge compression fracture second lumbar vertebra initial encounter  EXAM: MRI LUMBAR SPINE WITHOUT CONTRAST  TECHNIQUE: Multiplanar, multisequence MR imaging of the lumbar spine was performed. No intravenous contrast was administered.  COMPARISON:  Lumbar MRI 07/11/2012.  FINDINGS: Segmentation:  Normal.  Lowest disc space L5-S1  Alignment:  Mild anterolisthesis L3-4.  Mild retrolisthesis L5-S1  Vertebrae: Mild compression fracture superior endplate of L2 with bone marrow edema suggesting recent fracture and healing. This appears benign. Superior endplate fracture of L4 on the right appears chronic.  Conus medullaris and cauda equina: Conus extends to the L1-2 level. Conus and cauda equina appear normal.  Paraspinal and other soft tissues: Negative for mass or adenopathy  Disc levels:  T12-L1:  Mild disc degeneration and Schmorl's node without stenosis  L1-2:  Disc bulging and facet hypertrophy with mild spinal stenosis  L2-3:  Mild disc and facet degeneration with mild spinal stenosis  L3-4: Mild anterolisthesis. Diffuse disc bulging and moderate facet hypertrophy. Moderate spinal stenosis with compression of the thecal sac. Progression of spinal stenosis since 2013  L4-5: Central disc protrusion with diffuse disc bulging. Bilateral facet hypertrophy with moderate spinal stenosis. Subarticular stenosis bilaterally. No change from 2013  L5-S1: Diffuse disc bulging and endplate spurring. Moderate spinal stenosis. Moderate subarticular and foraminal stenosis, left greater than right. Left-sided disc protrusion unchanged from 2013.  IMPRESSION: Mild spinal stenosis L1-2 and L2-3  Moderate spinal stenosis L3-4  Moderate spinal stenosis L4-5 with subarticular stenosis bilaterally  Moderate spinal stenosis L5-S1 with subarticular and foraminal stenosis left  greater than right  Mild compression fracture L2 with bone marrow edema suggesting recent or healing fracture.   Electronically Signed   By: Franchot Gallo M.D.   On: 10/12/2017 16:12    Complexity Note: Imaging results reviewed. Results shared with Ms. Roediger, using Layman's terms.                         ROS  Cardiovascular History: No reported cardiovascular signs or symptoms such as High blood pressure, coronary artery disease, abnormal heart rate or rhythm, heart attack, blood thinner therapy or heart weakness and/or failure Pulmonary or Respiratory History: No reported pulmonary signs or symptoms such as wheezing and difficulty taking a deep full breath (Asthma), difficulty blowing air out (Emphysema), coughing up mucus (Bronchitis), persistent dry cough, or temporary stoppage of breathing during sleep Neurological History: No reported neurological signs or symptoms such as seizures, abnormal skin sensations, urinary and/or fecal incontinence, being born with an abnormal open spine and/or a tethered spinal cord Review of Past Neurological Studies: No results found for this or any previous visit. Psychological-Psychiatric History: No reported psychological or psychiatric signs or symptoms such as difficulty sleeping, anxiety, depression, delusions or hallucinations (schizophrenial), mood swings (bipolar disorders) or suicidal ideations or attempts Gastrointestinal History: Reflux or heatburn Genitourinary History: No reported renal or genitourinary signs or symptoms such as difficulty voiding or producing urine, peeing blood, non-functioning kidney, kidney stones, difficulty emptying  the bladder, difficulty controlling the flow of urine, or chronic kidney disease Hematological History: No reported hematological signs or symptoms such as prolonged bleeding, low or poor functioning platelets, bruising or bleeding easily, hereditary bleeding problems, low energy levels due to low hemoglobin or  being anemic Endocrine History: Slow thyroid Rheumatologic History: Joint aches and or swelling due to excess weight (Osteoarthritis) Musculoskeletal History: Negative for myasthenia gravis, muscular dystrophy, multiple sclerosis or malignant hyperthermia Work History: Retired  Allergies  Ms. Slider is allergic to hydrocodone; oxycodone; zoloft [sertraline hcl]; ciprofloxacin; sulfa antibiotics; tylenol [acetaminophen]; and ultram [tramadol].  Laboratory Chemistry  Inflammation Markers (CRP: Acute Phase) (ESR: Chronic Phase) Lab Results  Component Value Date   LATICACIDVEN 1.3 01/29/2014                 Renal Function Markers Lab Results  Component Value Date   BUN 8 02/08/2014   CREATININE 1.01 02/08/2014   GFRAA 60 (L) 02/08/2014   GFRNONAA 52 (L) 02/08/2014                 Hepatic Function Markers Lab Results  Component Value Date   AST 22 02/05/2014   ALT 13 02/05/2014   ALBUMIN 2.5 (L) 02/05/2014   ALKPHOS 67 02/05/2014                 Electrolytes Lab Results  Component Value Date   NA 140 02/08/2014   K 4.4 02/08/2014   CL 97 02/08/2014   CALCIUM 8.5 02/08/2014   MG 1.9 02/03/2014                 Neuropathy Markers Lab Results  Component Value Date   HGBA1C 6.0 (H) 01/27/2014                 Coagulation Parameters Lab Results  Component Value Date   INR 1.31 01/30/2014   LABPROT 16.0 (H) 01/30/2014   APTT 39 (H) 01/29/2014   PLT 382 02/08/2014   DDIMER 9.34 (H) 02/05/2014                 Cardiovascular Markers Lab Results  Component Value Date   CKTOTAL 206 (H) 02/04/2014   CKMB 10.4 (H) 02/04/2014   TROPONINI <0.30 02/05/2014   HGB 9.1 (L) 02/08/2014   HCT 27.8 (L) 02/08/2014                 Note: Lab results reviewed.  [place]  Drug: Ms. Crotwell  reports that she does not use drugs. Alcohol:  reports that she does not drink alcohol. Tobacco:  reports that she quit smoking about 22 years ago. Her smoking use included cigarettes. She has a  30.00 pack-year smoking history. she has never used smokeless tobacco. Medical:  has a past medical history of Anemia, Asthma, Cancer ([place]), Chronic back pain, Depression, GERD (gastroesophageal reflux disease), Headache(784.0), Hypertension, Hypothyroidism, Peripheral vascular disease ([place]), Sepsis ([place]) (01/27/2014), Shortness of breath, and Tendinitis of both rotator cuffs. Family: family history includes Thalassemia in her paternal aunt and son.  Past Surgical History:  Procedure Laterality Date  . ABDOMINAL HYSTERECTOMY     vagina  . ANTERIOR CERVICAL DECOMP/DISCECTOMY FUSION N/A 05/01/2013   Procedure: ANTERIOR CERVICAL DECOMPRESSION/DISCECTOMY FUSION 3 LEVELS;  Surgeon: Elaina Hoops, MD;  Location: [facility];  Service: Neurosurgery;  Laterality: N/A;  ANTERIOR CERVICAL DECOMPRESSION/DISCECTOMY FUSION 3 LEVELS  . APPENDECTOMY    . BLADDER REPAIR     after hysterectomy same day  . EYE  SURGERY Bilateral    cataracts  . TONSILLECTOMY    . TUBAL LIGATION     Active Ambulatory Problems    Diagnosis Date Noted  . Hypotension, unspecified 01/27/2014  . UTI (urinary tract infection) 01/27/2014  . Unspecified hypothyroidism 01/27/2014  . GERD (gastroesophageal reflux disease) 01/27/2014  . Nonspecific (abnormal) findings on radiological and other examination of gastrointestinal tract 01/31/2014  . Shortness of breath 02/05/2014  . HTN (hypertension) 02/05/2014  . Anemia 02/05/2014  . SOB (shortness of breath) 02/05/2014  . Diastolic CHF, acute ([place]) 02/08/2014  . COPD (chronic obstructive pulmonary disease) ([place]) 11/16/2017  . Hypoxia 02/27/2014  . Increased frequency of urination 02/12/2013  . Other chronic cystitis without hematuria 02/12/2013  . Recurrent UTI 02/12/2013  . Thalassemia 11/16/2017  . Urge incontinence 02/12/2013  . Anemia, unspecified 02/27/2014  . Acute diastolic heart failure ([place]) 02/08/2014  . Esophageal reflux 01/27/2014  . Hypertension 11/16/2017  .  Hypothyroid 11/16/2017  . Urinary tract infection 02/27/2014  . Abnormal MRI, lumbar spine (2018) 11/16/2017  . Lumbar central spinal stenosis 11/16/2017  . L2 compression fracture 11/16/2017  . H/O cervical spine surgery (C4-C7 ACDF) 11/16/2017  . Abnormal MRI, cervical spine (2014) 11/16/2017  . Chronic low back pain (Bilateral) 11/16/2017  . Lumbar facet joint osteoarthritis (Bilateral) 11/16/2017  . Lumbar facet hypertrophy (Bilateral) 11/16/2017  . Lumbar facet syndrome (Bilateral) 11/16/2017  . Osteoarthritis of lumbar spine 11/16/2017  . Lumbar spondylosis 11/16/2017  . Cervical spondylosis 11/16/2017  . Cervical facet arthropathy (Bilateral) 11/16/2017  . Chronic pain syndrome 11/16/2017  . Osteoarthritis 11/16/2017  . Grade 1 anterolisthesis of L3 over L4 11/16/2017  . Grade 1 retrolisthesis of L5 over S1 11/16/2017  . Lumbar lateral recess stenosis (Bilateral) 11/16/2017  . Chronic lower extremity pain (Primary Area of Pain) (Bilateral) (R>L) 11/16/2017  . Chronic lower extremity radicular pain (Bilateral) (R>L) 11/16/2017  . Chronic lumbar radiculitis (Right) 11/16/2017  . DDD (degenerative disc disease), lumbar 11/16/2017  . DDD (degenerative disc disease), cervical 11/16/2017   Resolved Ambulatory Problems    Diagnosis Date Noted  . Sepsis ([place]) 01/27/2014  . HCAP (healthcare-associated pneumonia) 01/27/2014   Past Medical History:  Diagnosis Date  . Anemia   . Asthma   . Cancer ([place])   . Chronic back pain   . Depression   . GERD (gastroesophageal reflux disease)   . Headache(784.0)   . Hypertension   . Hypothyroidism   . Peripheral vascular disease ([place])   . Sepsis ([place]) 01/27/2014  . Shortness of breath   . Tendinitis of both rotator cuffs    Constitutional Exam  General appearance: Well nourished, well developed, and well hydrated. In no apparent acute distress Vitals:   11/16/17 1425 11/16/17 1430 11/16/17 1435 11/16/17 1443  BP: 137/73 (!) 142/72  (!) 150/71 (!) 146/68  Pulse: 74 74 77 76  Resp: '15 18 19 16  ' Temp:      SpO2: 98% 96% 95% 96%  Weight:    177 lb (80.3 kg)  Height:       BMI Assessment: Estimated body mass index is 32.37 kg/m as calculated from the following:   Height as of this encounter: '5\' 2"'  (1.575 m).   Weight as of this encounter: 177 lb (80.3 kg).  BMI interpretation table: BMI level Category Range association with higher incidence of chronic pain  <18 kg/m2 Underweight   18.5-24.9 kg/m2 Ideal body weight   25-29.9 kg/m2 Overweight Increased incidence by 20%  30-34.9 kg/m2 Obese (  Class I) Increased incidence by 68%  35-39.9 kg/m2 Severe obesity (Class II) Increased incidence by 136%  >40 kg/m2 Extreme obesity (Class III) Increased incidence by 254%   BMI Readings from Last 4 Encounters:  11/16/17 32.37 kg/m  01/31/14 34.44 kg/m  05/01/13 31.60 kg/m  04/29/13 32.42 kg/m   Wt Readings from Last 4 Encounters:  11/16/17 177 lb (80.3 kg)  01/31/14 188 lb 4.4 oz (85.4 kg)  05/01/13 184 lb 1.4 oz (83.5 kg)  04/29/13 183 lb (83 kg)  Psych/Mental status: Alert, oriented x 3 (person, place, & time)       Eyes: PERLA Respiratory: No evidence of acute respiratory distress  Lumbar Spine Area Exam  Skin & Axial Inspection: No masses, redness, or swelling Alignment: Symmetrical Functional ROM: Unrestricted ROM      Stability: No instability detected Muscle Tone/Strength: Functionally intact. No obvious neuro-muscular anomalies detected. Sensory (Neurological): Unimpaired Palpation: No palpable anomalies       Provocative Tests: Lumbar Hyperextension and rotation test: evaluation deferred today       Lumbar Lateral bending test: evaluation deferred today       Patrick's Maneuver: evaluation deferred today                    Gait & Posture Assessment  Ambulation: Patient ambulates using a wheel chair Gait: Very limited, using assistive device to ambulate Posture: Antalgic   Lower Extremity Exam     Side: Right lower extremity  Side: Left lower extremity  Skin & Extremity Inspection: Skin color, temperature, and hair growth are WNL. No peripheral edema or cyanosis. No masses, redness, swelling, asymmetry, or associated skin lesions. No contractures.  Skin & Extremity Inspection: Skin color, temperature, and hair growth are WNL. No peripheral edema or cyanosis. No masses, redness, swelling, asymmetry, or associated skin lesions. No contractures.  Functional ROM: Decreased ROM          Functional ROM: Unrestricted ROM          Muscle Tone/Strength: Deconditioned  Muscle Tone/Strength: Deconditioned  Sensory (Neurological): Dermatomal pain pattern  Sensory (Neurological): Dermatomal pain pattern  Palpation: No palpable anomalies  Palpation: No palpable anomalies   Pre-op Assessment:  Ms. Friar is a 84 y.o. (year old), female patient, seen today for interventional treatment. She  has a past surgical history that includes Tubal ligation; Appendectomy; Tonsillectomy; Eye surgery (Bilateral); Abdominal hysterectomy; Bladder repair; and Anterior cervical decomp/discectomy fusion (N/A, 05/01/2013). Ms. Croll has a current medication list which includes the following prescription(s): albuterol, amlodipine, amoxicillin-clavulanate, vitamin d, fexofenadine, furosemide, guaifenesin, levothyroxine, loperamide, magnesium oxide, metoprolol, multivitamin with minerals, omeprazole, potassium chloride sa, solifenacin, vitamin c, vitamin e, and estrogens (conjugated). Her primarily concern today is the Back Pain (low)  Initial Vital Signs:  Pulse Rate: 79 Temp: 98 F (36.7 C) Resp: 18 BP: 137/63 SpO2: 94 %  BMI: Estimated body mass index is 32.37 kg/m as calculated from the following:   Height as of this encounter: '5\' 2"'  (1.575 m).   Weight as of this encounter: 177 lb (80.3 kg).  Risk Assessment: Allergies: Reviewed. She is allergic to hydrocodone; oxycodone; zoloft [sertraline hcl]; ciprofloxacin; sulfa  antibiotics; tylenol [acetaminophen]; and ultram [tramadol].  Allergy Precautions: None required Coagulopathies: Reviewed. None identified.  Blood-thinner therapy: None at this time Active Infection(s): Reviewed. None identified. Ms. Donoso is afebrile  Site Confirmation: Ms. Guidry was asked to confirm the procedure and laterality before marking the site Procedure checklist: Completed Consent: Before the procedure and under the  influence of no sedative(s), amnesic(s), or anxiolytics, the patient was informed of the treatment options, risks and possible complications. To fulfill our ethical and legal obligations, as recommended by the American Medical Association's Code of Ethics, I have informed the patient of my clinical impression; the nature and purpose of the treatment or procedure; the risks, benefits, and possible complications of the intervention; the alternatives, including doing nothing; the risk(s) and benefit(s) of the alternative treatment(s) or procedure(s); and the risk(s) and benefit(s) of doing nothing. The patient was provided information about the general risks and possible complications associated with the procedure. These may include, but are not limited to: failure to achieve desired goals, infection, bleeding, organ or nerve damage, allergic reactions, paralysis, and death. In addition, the patient was informed of those risks and complications associated to Spine-related procedures, such as failure to decrease pain; infection (i.e.: Meningitis, epidural or intraspinal abscess); bleeding (i.e.: epidural hematoma, subarachnoid hemorrhage, or any other type of intraspinal or peri-dural bleeding); organ or nerve damage (i.e.: Any type of peripheral nerve, nerve root, or spinal cord injury) with subsequent damage to sensory, motor, and/or autonomic systems, resulting in permanent pain, numbness, and/or weakness of one or several areas of the body; allergic reactions; (i.e.: anaphylactic  reaction); and/or death. Furthermore, the patient was informed of those risks and complications associated with the medications. These include, but are not limited to: allergic reactions (i.e.: anaphylactic or anaphylactoid reaction(s)); adrenal axis suppression; blood sugar elevation that in diabetics may result in ketoacidosis or comma; water retention that in patients with history of congestive heart failure may result in shortness of breath, pulmonary edema, and decompensation with resultant heart failure; weight gain; swelling or edema; medication-induced neural toxicity; particulate matter embolism and blood vessel occlusion with resultant organ, and/or nervous system infarction; and/or aseptic necrosis of one or more joints. Finally, the patient was informed that Medicine is not an exact science; therefore, there is also the possibility of unforeseen or unpredictable risks and/or possible complications that may result in a catastrophic outcome. The patient indicated having understood very clearly. We have given the patient no guarantees and we have made no promises. Enough time was given to the patient to ask questions, all of which were answered to the patient's satisfaction. Ms. Schneck has indicated that she wanted to continue with the procedure. Attestation: I, the ordering provider, attest that I have discussed with the patient the benefits, risks, side-effects, alternatives, likelihood of achieving goals, and potential problems during recovery for the procedure that I have provided informed consent. Date: 11/16/2017  Pre-Procedure Preparation:  Monitoring: As per clinic protocol. Respiration, ETCO2, SpO2, BP, heart rate and rhythm monitor placed and checked for adequate function Safety Precautions: Patient was assessed for positional comfort and pressure points before starting the procedure. Time-out: I initiated and conducted the "Time-out" before starting the procedure, as per protocol. The  patient was asked to participate by confirming the accuracy of the "Time Out" information. Verification of the correct person, site, and procedure were performed and confirmed by me, the nursing staff, and the patient. "Time-out" conducted as per Joint Commission's Universal Protocol (UP.01.01.01). "Time-out" Date & Time: 11/16/2017; 1429 hrs.  Description of Procedure Process:   Position: Prone with head of the table was raised to facilitate breathing. Target Area: The interlaminar space, initially targeting the lower laminar border of the superior vertebral body. Approach: Paramedial approach. Area Prepped: Entire Posterior Lumbar Region Prepping solution: ChloraPrep (2% chlorhexidine gluconate and 70% isopropyl alcohol) Safety Precautions: Aspiration looking  for blood return was conducted prior to all injections. At no point did we inject any substances, as a needle was being advanced. No attempts were made at seeking any paresthesias. Safe injection practices and needle disposal techniques used. Medications properly checked for expiration dates. SDV (single dose vial) medications used. Description of the Procedure: Protocol guidelines were followed. The procedure needle was introduced through the skin, ipsilateral to the reported pain, and advanced to the target area. Bone was contacted and the needle walked caudad, until the lamina was cleared. The epidural space was identified using "loss-of-resistance technique" with 2-3 ml of PF-NaCl (0.9% NSS), in a 5cc LOR glass syringe. Vitals:   11/16/17 1425 11/16/17 1430 11/16/17 1435 11/16/17 1443  BP: 137/73 (!) 142/72 (!) 150/71 (!) 146/68  Pulse: 74 74 77 76  Resp: '15 18 19 16  ' Temp:      SpO2: 98% 96% 95% 96%  Weight:    177 lb (80.3 kg)  Height:        Start Time: 1429 hrs. End Time: 1436 hrs. Materials:  Needle(s) Type: Epidural needle Gauge: 17G Length: 3.5-in Medication(s): We administered iopamidol, lidocaine, sodium chloride flush,  ropivacaine (PF) 2 mg/mL (0.2%), and triamcinolone acetonide. Please see chart orders for dosing details.  Imaging Guidance (Spinal):  Type of Imaging Technique: Fluoroscopy Guidance (Spinal) Indication(s): Assistance in needle guidance and placement for procedures requiring needle placement in or near specific anatomical locations not easily accessible without such assistance. Exposure Time: Please see nurses notes. Contrast: Before injecting any contrast, we confirmed that the patient did not have an allergy to iodine, shellfish, or radiological contrast. Once satisfactory needle placement was completed at the desired level, radiological contrast was injected. Contrast injected under live fluoroscopy. No contrast complications. See chart for type and volume of contrast used. Fluoroscopic Guidance: I was personally present during the use of fluoroscopy. "Tunnel Vision Technique" used to obtain the best possible view of the target area. Parallax error corrected before commencing the procedure. "Direction-depth-direction" technique used to introduce the needle under continuous pulsed fluoroscopy. Once target was reached, antero-posterior, oblique, and lateral fluoroscopic projection used confirm needle placement in all planes. Images permanently stored in EMR. Interpretation: I personally interpreted the imaging intraoperatively. Adequate needle placement confirmed in multiple planes. Appropriate spread of contrast into desired area was observed. No evidence of afferent or efferent intravascular uptake. No intrathecal or subarachnoid spread observed. Permanent images saved into the patient's record.  Antibiotic Prophylaxis:  Indication(s): None identified Antibiotic given: None  Post-operative Assessment:  Post-procedure Vital Signs:  Pulse Rate: 76 Temp: 98 F (36.7 C) Resp: 16 BP: (!) 146/68 SpO2: 96 %  EBL: None  Complications: No immediate post-treatment complications observed by team, or  reported by patient.  Note: The patient tolerated the entire procedure well. A repeat set of vitals were taken after the procedure and the patient was kept under observation following institutional policy, for this type of procedure. Post-procedural neurological assessment was performed, showing return to baseline, prior to discharge. The patient was provided with post-procedure discharge instructions, including a section on how to identify potential problems. Should any problems arise concerning this procedure, the patient was given instructions to immediately contact us, at any time, without hesitation. In any case, we plan to contact the patient by telephone for a follow-up status report regarding this interventional procedure.  Comments:  No additional relevant information.  Plan of Care   Imaging Orders     DG C-Arm 1-60 Min-No Report  Procedure  Orders     Lumbar Epidural Injection  Medications ordered for procedure: Meds ordered this encounter  Medications  . iopamidol (ISOVUE-M) 41 % intrathecal injection 10 mL    Must be Myelogram-compatible. If not available, you may substitute with a water-soluble, non-ionic, hypoallergenic, myelogram-compatible radiological contrast medium.  Marland Kitchen lidocaine (XYLOCAINE) 2 % (with pres) injection 200 mg  . sodium chloride flush (NS) 0.9 % injection 2 mL  . ropivacaine (PF) 2 mg/mL (0.2%) (NAROPIN) injection 2 mL  . triamcinolone acetonide (KENALOG-40) injection 40 mg   Medications administered: We administered iopamidol, lidocaine, sodium chloride flush, ropivacaine (PF) 2 mg/mL (0.2%), and triamcinolone acetonide.  See the medical record for exact dosing, route, and time of administration.  New Prescriptions   No medications on file   Disposition: Discharge home  Discharge Date & Time: 11/16/2017; 1446 hrs.   Physician-requested Follow-up: No Follow-up on file.  Future Appointments  Date Time Provider [place]  11/29/2017  1:30 PM  Milinda Pointer, MD [facility] None   Primary Care Physician: Marinda Elk, MD Location: [facility] Note by: Gaspar Cola, MD Date: 11/16/2017; Time: 3:14 PM  Disclaimer:  Medicine is not an Chief Strategy Officer. The only guarantee in medicine is that nothing is guaranteed. It is important to note that the decision to proceed with this intervention was based on the information collected from the patient. The Data and conclusions were drawn from the patient's questionnaire, the interview, and the physical examination. Because the information was provided in large part by the patient, it cannot be guaranteed that it has not been purposely or unconsciously manipulated. Every effort has been made to obtain as much relevant data as possible for this evaluation. It is important to note that the conclusions that lead to this procedure are derived in large part from the available data. Always take into account that the treatment will also be dependent on availability of resources and existing treatment guidelines, considered by other Pain Management Practitioners as being common knowledge and practice, at the time of the intervention. For Medico-Legal purposes, it is also important to point out that variation in procedural techniques and pharmacological choices are the acceptable norm. The indications, contraindications, technique, and results of the above procedure should only be interpreted and judged by a Board-Certified Interventional Pain Specialist with extensive familiarity and expertise in the same exact procedure and technique.

## 2017-11-16 NOTE — Progress Notes (Signed)
Safety precautions to be maintained throughout the outpatient stay will include: orient to surroundings, keep bed in low position, maintain call bell within reach at all times, provide assistance with transfer out of bed and ambulation.  

## 2017-11-16 NOTE — Patient Instructions (Signed)
____________________________________________________________________________________________  Pain Scale  Introduction: The pain score used by this practice is the Verbal Numerical Rating Scale (VNRS-11). This is an 11-point scale. It is for adults and children 10 years or older. There are significant differences in how the pain score is reported, used, and applied. Forget everything you learned in the past and learn this scoring system.  General Information: The scale should reflect your current level of pain. Unless you are specifically asked for the level of your worst pain, or your average pain. If you are asked for one of these two, then it should be understood that it is over the past 24 hours.  Basic Activities of Daily Living (ADL): Personal hygiene, dressing, eating, transferring, and using restroom.  Instructions: Most patients tend to report their level of pain as a combination of two factors, their physical pain and their psychosocial pain. This last one is also known as "suffering" and it is reflection of how physical pain affects you socially and psychologically. From now on, report them separately. From this point on, when asked to report your pain level, report only your physical pain. Use the following table for reference.  Pain Clinic Pain Levels (0-5/10)  Pain Level Score  Description  No Pain 0   Mild pain 1 Nagging, annoying, but does not interfere with basic activities of daily living (ADL). Patients are able to eat, bathe, get dressed, toileting (being able to get on and off the toilet and perform personal hygiene functions), transfer (move in and out of bed or a chair without assistance), and maintain continence (able to control bladder and bowel functions). Blood pressure and heart rate are unaffected. A normal heart rate for a healthy adult ranges from 60 to 100 bpm (beats per minute).   Mild to moderate pain 2 Noticeable and distracting. Impossible to hide from other  people. More frequent flare-ups. Still possible to adapt and function close to normal. It can be very annoying and may have occasional stronger flare-ups. With discipline, patients may get used to it and adapt.   Moderate pain 3 Interferes significantly with activities of daily living (ADL). It becomes difficult to feed, bathe, get dressed, get on and off the toilet or to perform personal hygiene functions. Difficult to get in and out of bed or a chair without assistance. Very distracting. With effort, it can be ignored when deeply involved in activities.   Moderately severe pain 4 Impossible to ignore for more than a few minutes. With effort, patients may still be able to manage work or participate in some social activities. Very difficult to concentrate. Signs of autonomic nervous system discharge are evident: dilated pupils (mydriasis); mild sweating (diaphoresis); sleep interference. Heart rate becomes elevated (>115 bpm). Diastolic blood pressure (lower number) rises above 100 mmHg. Patients find relief in laying down and not moving.   Severe pain 5 Intense and extremely unpleasant. Associated with frowning face and frequent crying. Pain overwhelms the senses.  Ability to do any activity or maintain social relationships becomes significantly limited. Conversation becomes difficult. Pacing back and forth is common, as getting into a comfortable position is nearly impossible. Pain wakes you up from deep sleep. Physical signs will be obvious: pupillary dilation; increased sweating; goosebumps; brisk reflexes; cold, clammy hands and feet; nausea, vomiting or dry heaves; loss of appetite; significant sleep disturbance with inability to fall asleep or to remain asleep. When persistent, significant weight loss is observed due to the complete loss of appetite and sleep deprivation.  Blood   pressure and heart rate becomes significantly elevated. Caution: If elevated blood pressure triggers a pounding headache  associated with blurred vision, then the patient should immediately seek attention at an urgent or emergency care unit, as these may be signs of an impending stroke.    Emergency Department Pain Levels (6-10/10)  Emergency Room Pain 6 Severely limiting. Requires emergency care and should not be seen or managed at an outpatient pain management facility. Communication becomes difficult and requires great effort. Assistance to reach the emergency department may be required. Facial flushing and profuse sweating along with potentially dangerous increases in heart rate and blood pressure will be evident.   Distressing pain 7 Self-care is very difficult. Assistance is required to transport, or use restroom. Assistance to reach the emergency department will be required. Tasks requiring coordination, such as bathing and getting dressed become very difficult.   Disabling pain 8 Self-care is no longer possible. At this level, pain is disabling. The individual is unable to do even the most "basic" activities such as walking, eating, bathing, dressing, transferring to a bed, or toileting. Fine motor skills are lost. It is difficult to think clearly.   Incapacitating pain 9 Pain becomes incapacitating. Thought processing is no longer possible. Difficult to remember your own name. Control of movement and coordination are lost.   The worst pain imaginable 10 At this level, most patients pass out from pain. When this level is reached, collapse of the autonomic nervous system occurs, leading to a sudden drop in blood pressure and heart rate. This in turn results in a temporary and dramatic drop in blood flow to the brain, leading to a loss of consciousness. Fainting is one of the body's self defense mechanisms. Passing out puts the brain in a calmed state and causes it to shut down for a while, in order to begin the healing process.    Summary: 1. Refer to this scale when providing [place] with your pain level. 2. Be  accurate and careful when reporting your pain level. This will help with your care. 3. Over-reporting your pain level will lead to loss of credibility. 4. Even a level of 1/10 means that there is pain and will be treated at our facility. 5. High, inaccurate reporting will be documented as "Symptom Exaggeration", leading to loss of credibility and suspicions of possible secondary gains such as obtaining more narcotics, or wanting to appear disabled, for fraudulent reasons. 6. Only pain levels of 5 or below will be seen at our facility. 7. Pain levels of 6 and above will be sent to the Emergency Department and the appointment cancelled. ____________________________________________________________________________________________   ____________________________________________________________________________________________  Post-Procedure instructions Instructions:  Apply ice: Fill a plastic sandwich bag with crushed ice. Cover it with a small towel and apply to injection site. Apply for 15 minutes then remove x 15 minutes. Repeat sequence on day of procedure, until you go to bed. The purpose is to minimize swelling and discomfort after procedure.  Apply heat: Apply heat to procedure site starting the day following the procedure. The purpose is to treat any soreness and discomfort from the procedure.  Food intake: Start with clear liquids (like water) and advance to regular food, as tolerated.   Physical activities: Keep activities to a minimum for the first 8 hours after the procedure.   Driving: If you have received any sedation, you are not allowed to drive for 24 hours after your procedure.  Blood thinner: Restart your blood thinner 6 hours after your procedure. (  Only for those taking blood thinners)  Insulin: As soon as you can eat, you may resume your normal dosing schedule. (Only for those taking insulin)  Infection prevention: Keep procedure site clean and dry.  Post-procedure Pain  Diary: Extremely important that this be done correctly and accurately. Recorded information will be used to determine the next step in treatment.  Pain evaluated is that of treated area only. Do not include pain from an untreated area.  Complete every hour, on the hour, for the initial 8 hours. Set an alarm to help you do this part accurately.  Do not go to sleep and have it completed later. It will not be accurate.  Follow-up appointment: Keep your follow-up appointment after the procedure. Usually 2 weeks for most procedures. (6 weeks in the case of radiofrequency.) Bring you pain diary.  Expect:  From numbing medicine (AKA: Local Anesthetics): Numbness or decrease in pain.  Onset: Full effect within 15 minutes of injected.  Duration: It will depend on the type of local anesthetic used. On the average, 1 to 8 hours.   From steroids: Decrease in swelling or inflammation. Once inflammation is improved, relief of the pain will follow.  Onset of benefits: Depends on the amount of swelling present. The more swelling, the longer it will take for the benefits to be seen. In some cases, up to 10 days.  Duration: Steroids will stay in the system x 2 weeks. Duration of benefits will depend on multiple posibilities including persistent irritating factors.  From procedure: Some discomfort is to be expected once the numbing medicine wears off. This should be minimal if ice and heat are applied as instructed. Call if:  You experience numbness and weakness that gets worse with time, as opposed to wearing off.  New onset bowel or bladder incontinence. (Spinal procedures only)  Emergency Numbers:  [place] business hours (Monday - Thursday, 8:00 AM - 4:00 PM) (Friday, 9:00 AM - 12:00 Noon): [phone]  After hours: [phone] ____________________________________________________________________________________________

## 2017-11-17 ENCOUNTER — Telehealth: Payer: Self-pay

## 2017-11-17 NOTE — Telephone Encounter (Signed)
Post procedure phone call.  Patients son states she is doing well.

## 2017-11-29 ENCOUNTER — Ambulatory Visit: Payer: Medicare Other | Admitting: Pain Medicine

## 2017-12-11 DIAGNOSIS — R296 Repeated falls: Secondary | ICD-10-CM | POA: Insufficient documentation

## 2017-12-11 DIAGNOSIS — R2681 Unsteadiness on feet: Secondary | ICD-10-CM | POA: Insufficient documentation

## 2017-12-13 ENCOUNTER — Ambulatory Visit: Payer: Medicare Other | Admitting: Pain Medicine

## 2017-12-13 NOTE — Progress Notes (Deleted)
Patient's Name: Laura Frey  MRN: [id number]  Referring Provider: Marinda Elk, MD  DOB: 07-06-1932  PCP: Marinda Elk, MD  DOS: 12/13/2017  Note by: Gaspar Cola, MD  Service setting: Ambulatory outpatient  Specialty: Interventional Pain Management  Location: [facility] (AMB) Pain Management Facility    Patient type: Established   Primary Reason(s) for Visit: Encounter for post-procedure evaluation of chronic illness with mild to moderate exacerbation CC: No chief complaint on file.  HPI  Laura Frey is a 84 y.o. year old, female patient, who comes today for a post-procedure evaluation. She has Hypotension, unspecified; UTI (urinary tract infection); Unspecified hypothyroidism; GERD (gastroesophageal reflux disease); Nonspecific (abnormal) findings on radiological and other examination of gastrointestinal tract; Shortness of breath; HTN (hypertension); Anemia; SOB (shortness of breath); Diastolic CHF, acute (HCC); COPD (chronic obstructive pulmonary disease) ([place]); Hypoxia; Increased frequency of urination; Other chronic cystitis without hematuria; Recurrent UTI; Thalassemia; Urge incontinence; Anemia, unspecified; Acute diastolic heart failure ([place]); Esophageal reflux; Hypertension; Hypothyroid; Urinary tract infection; Abnormal MRI, lumbar spine (2018); Lumbar central spinal stenosis; L2 compression fracture; H/O cervical spine surgery (C4-C7 ACDF); Abnormal MRI, cervical spine (2014); Chronic low back pain (Bilateral); Lumbar facet joint osteoarthritis (Bilateral); Lumbar facet hypertrophy (Bilateral); Lumbar facet syndrome (Bilateral); Osteoarthritis of lumbar spine; Lumbar spondylosis; Cervical spondylosis; Cervical facet arthropathy (Bilateral); Chronic pain syndrome; Osteoarthritis; Grade 1 anterolisthesis of L3 over L4; Grade 1 retrolisthesis of L5 over S1; Lumbar lateral recess stenosis (Bilateral); Chronic lower extremity pain (Primary Area of Pain) (Bilateral) (R>L); Chronic  lower extremity radicular pain (Bilateral) (R>L); Chronic lumbar radiculitis (Right); DDD (degenerative disc disease), lumbar; and DDD (degenerative disc disease), cervical on their problem list. Her primarily concern today is the No chief complaint on file.  Pain Assessment: Location:     Radiating:   Onset:   Duration:   Quality:   Severity:  /10 (self-reported pain score)  Note: Reported level is compatible with observation.                         When using our objective Pain Scale, levels between 6 and 10/10 are said to belong in an emergency room, as it progressively worsens from a 6/10, described as severely limiting, requiring emergency care not usually available at an outpatient pain management facility. At a 6/10 level, communication becomes difficult and requires great effort. Assistance to reach the emergency department may be required. Facial flushing and profuse sweating along with potentially dangerous increases in heart rate and blood pressure will be evident. Effect on ADL:   Timing:   Modifying factors:    Laura Frey comes in today for post-procedure evaluation after the treatment done on 11/16/2017.  Further details on both, my assessment(s), as well as the proposed treatment plan, please see below.  Post-Procedure Assessment  11/16/2017 Procedure: Diagnostic/therapeutic right-sided L3-4 lumbar epidural steroid injection #1 under fluoroscopic guidance, no sedation. Pre-procedure pain score:  8/10 Post-procedure pain score: 0/10 (100% relief) Influential Factors: BMI:   Intra-procedural challenges: None observed.         Assessment challenges: None detected.              Reported side-effects: None.        Post-procedural adverse reactions or complications: None reported         Sedation: No sedation used. When no sedatives are used, the analgesic levels obtained are directly associated to the effectiveness of the local anesthetics. However, when sedation is provided, the  level of analgesia obtained during the initial 1 hour following the intervention, is believed to be the result of a combination of factors. These factors may include, but are not limited to: 1. The effectiveness of the local anesthetics used. 2. The effects of the analgesic(s) and/or anxiolytic(s) used. 3. The degree of discomfort experienced by the patient at the time of the procedure. 4. The patients ability and reliability in recalling and recording the events. 5. The presence and influence of possible secondary gains and/or psychosocial factors. Reported result: Relief experienced during the 1st hour after the procedure:   (Ultra-Short Term Relief)            Interpretative annotation: Clinically appropriate result. No IV Analgesic or Anxiolytic given, therefore benefits are completely due to Local Anesthetic effects.          Effects of local anesthetic: The analgesic effects attained during this period are directly associated to the localized infiltration of local anesthetics and therefore cary significant diagnostic value as to the etiological location, or anatomical origin, of the pain. Expected duration of relief is directly dependent on the pharmacodynamics of the local anesthetic used. Long-acting (4-6 hours) anesthetics used.  Reported result: Relief during the next 4 to 6 hour after the procedure:   (Short-Term Relief)            Interpretative annotation: Clinically appropriate result. Analgesia during this period is likely to be Local Anesthetic-related.          Long-term benefit: Defined as the period of time past the expected duration of local anesthetics (1 hour for short-acting and 4-6 hours for long-acting). With the possible exception of prolonged sympathetic blockade from the local anesthetics, benefits during this period are typically attributed to, or associated with, other factors such as analgesic sensory neuropraxia, antiinflammatory effects, or beneficial biochemical changes  provided by agents other than the local anesthetics.  Reported result: Extended relief following procedure:   (Long-Term Relief)            Interpretative annotation: Clinically appropriate result. Good relief. No permanent benefit expected. Inflammation plays a part in the etiology to the pain.          Current benefits: Defined as reported results that persistent at this point in time.   Analgesia: *** %            Function: Somewhat improved ROM: Somewhat improved Interpretative annotation: Recurrence of symptoms. No permanent benefit expected. Effective diagnostic intervention.          Interpretation: Results would suggest a successful diagnostic intervention.                  Plan:  Please see "Plan of Care" for details.                Laboratory Chemistry  Inflammation Markers (CRP: Acute Phase) (ESR: Chronic Phase) Lab Results  Component Value Date   LATICACIDVEN 1.3 01/29/2014                         Renal Function Markers Lab Results  Component Value Date   BUN 8 02/08/2014   CREATININE 1.01 02/08/2014   GFRAA 60 (L) 02/08/2014   GFRNONAA 52 (L) 02/08/2014                 Hepatic Function Markers Lab Results  Component Value Date   AST 22 02/05/2014   ALT 13 02/05/2014   ALBUMIN 2.5 (L) 02/05/2014  ALKPHOS 67 02/05/2014                 Electrolytes Lab Results  Component Value Date   NA 140 02/08/2014   K 4.4 02/08/2014   CL 97 02/08/2014   CALCIUM 8.5 02/08/2014   MG 1.9 02/03/2014                        Neuropathy Markers Lab Results  Component Value Date   HGBA1C 6.0 (H) 01/27/2014                 Coagulation Parameters Lab Results  Component Value Date   INR 1.31 01/30/2014   LABPROT 16.0 (H) 01/30/2014   APTT 39 (H) 01/29/2014   PLT 382 02/08/2014   DDIMER 9.34 (H) 02/05/2014                 Cardiovascular Markers Lab Results  Component Value Date   CKTOTAL 206 (H) 02/04/2014   CKMB 10.4 (H) 02/04/2014   TROPONINI <0.30 02/05/2014    HGB 9.1 (L) 02/08/2014   HCT 27.8 (L) 02/08/2014                 Note: Lab results reviewed.  Recent Diagnostic Imaging Results  DG C-Arm 1-60 Min-No Report Fluoroscopy was utilized by the requesting physician.  No radiographic  interpretation.   Complexity Note: I personally reviewed the fluoroscopic imaging of the procedure.                        Meds   Current Outpatient Medications:  .  albuterol (PROVENTIL HFA;VENTOLIN HFA) 108 (90 BASE) MCG/ACT inhaler, Inhale 2 puffs into the lungs every 6 (six) hours as needed for wheezing., Disp: , Rfl:  .  amLODipine (NORVASC) 5 MG tablet, Take 2 tablets (10 mg total) by mouth daily., Disp: 60 tablet, Rfl: 0 .  amoxicillin-clavulanate (AUGMENTIN) 875-125 MG per tablet, Take 1 tablet by mouth every 12 (twelve) hours. For 3 days, Disp: 6 tablet, Rfl: 0 .  Cholecalciferol (VITAMIN D) 2000 UNITS tablet, Take 2,000 Units by mouth daily., Disp: , Rfl:  .  estrogens, conjugated, (PREMARIN) 0.45 MG tablet, Take 0.45 mg by mouth daily. , Disp: , Rfl:  .  fexofenadine (ALLEGRA) 180 MG tablet, Take 180 mg by mouth daily., Disp: , Rfl:  .  furosemide (LASIX) 20 MG tablet, Take 1 tablet (20 mg total) by mouth daily., Disp: 30 tablet, Rfl: 0 .  guaifenesin (ROBITUSSIN) 100 MG/5ML syrup, Take 200 mg by mouth 3 (three) times daily as needed for cough., Disp: , Rfl:  .  levothyroxine (SYNTHROID, LEVOTHROID) 75 MCG tablet, Take 75 mcg by mouth daily before breakfast., Disp: , Rfl:  .  loperamide (IMODIUM) 2 MG capsule, Take 1 capsule (2 mg total) by mouth 2 (two) times daily as needed for diarrhea or loose stools., Disp: 20 capsule, Rfl: 0 .  magnesium oxide (MAG-OX) 400 MG tablet, Take 400 mg by mouth daily., Disp: , Rfl:  .  metoprolol (TOPROL-XL) 200 MG 24 hr tablet, Take 200 mg by mouth daily., Disp: , Rfl:  .  Multiple Vitamin (MULTIVITAMIN WITH MINERALS) TABS, Take 1 tablet by mouth daily., Disp: , Rfl:  .  omeprazole (PRILOSEC) 20 MG capsule, Take 20  mg by mouth daily., Disp: , Rfl:  .  potassium chloride SA (K-DUR,KLOR-CON) 20 MEQ tablet, Take 2 tablets (40 mEq total) by mouth 2 (two) times daily with  a meal., Disp: 60 tablet, Rfl: 0 .  solifenacin (VESICARE) 5 MG tablet, Take 5 mg by mouth daily., Disp: , Rfl:  .  vitamin C (ASCORBIC ACID) 500 MG tablet, Take 500 mg by mouth daily., Disp: , Rfl:  .  VITAMIN E PO, Take 500 Units by mouth daily., Disp: , Rfl:   ROS  Constitutional: Denies any fever or chills Gastrointestinal: No reported hemesis, hematochezia, vomiting, or acute GI distress Musculoskeletal: Denies any acute onset joint swelling, redness, loss of ROM, or weakness Neurological: No reported episodes of acute onset apraxia, aphasia, dysarthria, agnosia, amnesia, paralysis, loss of coordination, or loss of consciousness  Allergies  Laura Frey is allergic to hydrocodone; oxycodone; zoloft [sertraline hcl]; ciprofloxacin; sulfa antibiotics; tylenol [acetaminophen]; and ultram [tramadol].  [place]  Drug: Laura Frey  reports that she does not use drugs. Alcohol:  reports that she does not drink alcohol. Tobacco:  reports that she quit smoking about 22 years ago. Her smoking use included cigarettes. She has a 30.00 pack-year smoking history. she has never used smokeless tobacco. Medical:  has a past medical history of Anemia, Asthma, Cancer ([place]), Chronic back pain, Depression, GERD (gastroesophageal reflux disease), Headache(784.0), Hypertension, Hypothyroidism, Peripheral vascular disease ([place]), Sepsis ([place]) (01/27/2014), Shortness of breath, and Tendinitis of both rotator cuffs. Surgical: Laura Frey  has a past surgical history that includes Tubal ligation; Appendectomy; Tonsillectomy; Eye surgery (Bilateral); Abdominal hysterectomy; Bladder repair; and Anterior cervical decomp/discectomy fusion (N/A, 05/01/2013). Family: family history includes Thalassemia in her paternal aunt and son.  Constitutional Exam  General appearance: Well  nourished, well developed, and well hydrated. In no apparent acute distress There were no vitals filed for this visit. BMI Assessment: Estimated body mass index is 32.37 kg/m as calculated from the following:   Height as of 11/16/17: '5\' 2"'  (1.575 m).   Weight as of 11/16/17: 177 lb (80.3 kg).  BMI interpretation table: BMI level Category Range association with higher incidence of chronic pain  <18 kg/m2 Underweight   18.5-24.9 kg/m2 Ideal body weight   25-29.9 kg/m2 Overweight Increased incidence by 20%  30-34.9 kg/m2 Obese (Class I) Increased incidence by 68%  35-39.9 kg/m2 Severe obesity (Class II) Increased incidence by 136%  >40 kg/m2 Extreme obesity (Class III) Increased incidence by 254%   BMI Readings from Last 4 Encounters:  11/16/17 32.37 kg/m  01/31/14 34.44 kg/m  05/01/13 31.60 kg/m  04/29/13 32.42 kg/m   Wt Readings from Last 4 Encounters:  11/16/17 177 lb (80.3 kg)  01/31/14 188 lb 4.4 oz (85.4 kg)  05/01/13 184 lb 1.4 oz (83.5 kg)  04/29/13 183 lb (83 kg)  Psych/Mental status: Alert, oriented x 3 (person, place, & time)       Eyes: PERLA Respiratory: No evidence of acute respiratory distress  Cervical Spine Area Exam  Skin & Axial Inspection: No masses, redness, edema, swelling, or associated skin lesions Alignment: Symmetrical Functional ROM: Unrestricted ROM      Stability: No instability detected Muscle Tone/Strength: Functionally intact. No obvious neuro-muscular anomalies detected. Sensory (Neurological): Unimpaired Palpation: No palpable anomalies              Upper Extremity (UE) Exam    Side: Right upper extremity  Side: Left upper extremity  Skin & Extremity Inspection: Skin color, temperature, and hair growth are WNL. No peripheral edema or cyanosis. No masses, redness, swelling, asymmetry, or associated skin lesions. No contractures.  Skin & Extremity Inspection: Skin color, temperature, and hair growth are WNL. No peripheral edema  or cyanosis. No  masses, redness, swelling, asymmetry, or associated skin lesions. No contractures.  Functional ROM: Unrestricted ROM          Functional ROM: Unrestricted ROM          Muscle Tone/Strength: Functionally intact. No obvious neuro-muscular anomalies detected.  Muscle Tone/Strength: Functionally intact. No obvious neuro-muscular anomalies detected.  Sensory (Neurological): Unimpaired          Sensory (Neurological): Unimpaired          Palpation: No palpable anomalies              Palpation: No palpable anomalies              Specialized Test(s): Deferred         Specialized Test(s): Deferred          Thoracic Spine Area Exam  Skin & Axial Inspection: No masses, redness, or swelling Alignment: Symmetrical Functional ROM: Unrestricted ROM Stability: No instability detected Muscle Tone/Strength: Functionally intact. No obvious neuro-muscular anomalies detected. Sensory (Neurological): Unimpaired Muscle strength & Tone: No palpable anomalies  Lumbar Spine Area Exam  Skin & Axial Inspection: No masses, redness, or swelling Alignment: Symmetrical Functional ROM: Unrestricted ROM      Stability: No instability detected Muscle Tone/Strength: Functionally intact. No obvious neuro-muscular anomalies detected. Sensory (Neurological): Unimpaired Palpation: No palpable anomalies       Provocative Tests: Lumbar Hyperextension and rotation test: evaluation deferred today       Lumbar Lateral bending test: evaluation deferred today       Patrick's Maneuver: evaluation deferred today                    Gait & Posture Assessment  Ambulation: Unassisted Gait: Relatively normal for age and body habitus Posture: WNL   Lower Extremity Exam    Side: Right lower extremity  Side: Left lower extremity  Skin & Extremity Inspection: Skin color, temperature, and hair growth are WNL. No peripheral edema or cyanosis. No masses, redness, swelling, asymmetry, or associated skin lesions. No contractures.  Skin &  Extremity Inspection: Skin color, temperature, and hair growth are WNL. No peripheral edema or cyanosis. No masses, redness, swelling, asymmetry, or associated skin lesions. No contractures.  Functional ROM: Unrestricted ROM          Functional ROM: Unrestricted ROM          Muscle Tone/Strength: Functionally intact. No obvious neuro-muscular anomalies detected.  Muscle Tone/Strength: Functionally intact. No obvious neuro-muscular anomalies detected.  Sensory (Neurological): Unimpaired  Sensory (Neurological): Unimpaired  Palpation: No palpable anomalies  Palpation: No palpable anomalies   Assessment  Primary Diagnosis & Pertinent Problem List: The primary encounter diagnosis was Chronic lower extremity pain (Primary Area of Pain) (Bilateral) (R>L). Diagnoses of Chronic lower extremity radicular pain (Bilateral) (R>L), Chronic lumbar radiculitis (Right), DDD (degenerative disc disease), lumbar, Grade 1 anterolisthesis of L3 over L4, Grade 1 retrolisthesis of L5 over S1, and L2 compression fracture were also pertinent to this visit.  Status Diagnosis  Controlled Controlled Controlled 1. Chronic lower extremity pain (Primary Area of Pain) (Bilateral) (R>L)   2. Chronic lower extremity radicular pain (Bilateral) (R>L)   3. Chronic lumbar radiculitis (Right)   4. DDD (degenerative disc disease), lumbar   5. Grade 1 anterolisthesis of L3 over L4   6. Grade 1 retrolisthesis of L5 over S1   7. L2 compression fracture     Problems updated and reviewed during this visit: No problems updated. Plan of Care  Pharmacotherapy (Medications Ordered): No orders of the defined types were placed in this encounter.  Medications administered today: Laura Frey had no medications administered during this visit.  Procedure Orders    No procedure(s) ordered today   Lab Orders  No laboratory test(s) ordered today   Imaging Orders  No imaging studies ordered today   Referral Orders  No referral(s)  requested today    Interventional management options: Planned, scheduled, and/or pending:   Diagnostic/therapeutic right-sided L3-4 lumbar epidural steroid injection #2 under fluoroscopic guidance, no sedation.   Considering:   Therapeutic right-sided L3-4 LESI  Diagnostic right-sided L3-4 transforaminal LESI  Diagnostic right-sided L5-S1 transforaminal LESI    Palliative PRN treatment(s):   Palliative right-sided L3-4 LESI    Provider-requested follow-up: No Follow-up on file.  Future Appointments  Date Time Provider [place]  12/13/2017 10:15 AM Milinda Pointer, MD [facility] None   Primary Care Physician: Marinda Elk, MD Location: [facility] Note by: Gaspar Cola, MD Date: 12/13/2017; Time: 8:00 AM

## 2018-01-01 ENCOUNTER — Ambulatory Visit: Payer: Medicare Other | Attending: Pain Medicine | Admitting: Pain Medicine

## 2018-01-01 ENCOUNTER — Encounter: Payer: Self-pay | Admitting: Pain Medicine

## 2018-01-01 ENCOUNTER — Ambulatory Visit
Admission: RE | Admit: 2018-01-01 | Discharge: 2018-01-01 | Disposition: A | Discharge status: Home / Self Care | Payer: Medicare Other | Source: Ambulatory Visit | Attending: Pain Medicine | Admitting: Pain Medicine

## 2018-01-01 ENCOUNTER — Other Ambulatory Visit: Payer: Self-pay

## 2018-01-01 VITALS — BP 126/58 | HR 78 | Temp 97.9°F | Resp 18 | Ht 64.0 in | Wt 177.0 lb

## 2018-01-01 DIAGNOSIS — Z79890 Hormone replacement therapy (postmenopausal): Secondary | ICD-10-CM | POA: Insufficient documentation

## 2018-01-01 DIAGNOSIS — Z87891 Personal history of nicotine dependence: Secondary | ICD-10-CM | POA: Diagnosis not present

## 2018-01-01 DIAGNOSIS — Z881 Allergy status to other antibiotic agents status: Secondary | ICD-10-CM | POA: Diagnosis not present

## 2018-01-01 DIAGNOSIS — Z885 Allergy status to narcotic agent status: Secondary | ICD-10-CM | POA: Insufficient documentation

## 2018-01-01 DIAGNOSIS — R29898 Other symptoms and signs involving the musculoskeletal system: Secondary | ICD-10-CM | POA: Diagnosis not present

## 2018-01-01 DIAGNOSIS — M546 Pain in thoracic spine: Secondary | ICD-10-CM | POA: Diagnosis present

## 2018-01-01 DIAGNOSIS — M542 Cervicalgia: Secondary | ICD-10-CM | POA: Diagnosis not present

## 2018-01-01 DIAGNOSIS — Z882 Allergy status to sulfonamides status: Secondary | ICD-10-CM | POA: Diagnosis not present

## 2018-01-01 DIAGNOSIS — M5416 Radiculopathy, lumbar region: Secondary | ICD-10-CM

## 2018-01-01 DIAGNOSIS — M4856XS Collapsed vertebra, not elsewhere classified, lumbar region, sequela of fracture: Secondary | ICD-10-CM

## 2018-01-01 DIAGNOSIS — M5441 Lumbago with sciatica, right side: Secondary | ICD-10-CM | POA: Diagnosis not present

## 2018-01-01 DIAGNOSIS — G894 Chronic pain syndrome: Secondary | ICD-10-CM | POA: Insufficient documentation

## 2018-01-01 DIAGNOSIS — R296 Repeated falls: Secondary | ICD-10-CM | POA: Diagnosis not present

## 2018-01-01 DIAGNOSIS — M47816 Spondylosis without myelopathy or radiculopathy, lumbar region: Secondary | ICD-10-CM | POA: Diagnosis not present

## 2018-01-01 DIAGNOSIS — M4856XA Collapsed vertebra, not elsewhere classified, lumbar region, initial encounter for fracture: Secondary | ICD-10-CM | POA: Diagnosis not present

## 2018-01-01 DIAGNOSIS — G9589 Other specified diseases of spinal cord: Secondary | ICD-10-CM | POA: Diagnosis not present

## 2018-01-01 DIAGNOSIS — M5442 Lumbago with sciatica, left side: Secondary | ICD-10-CM | POA: Diagnosis not present

## 2018-01-01 DIAGNOSIS — M5410 Radiculopathy, site unspecified: Secondary | ICD-10-CM | POA: Diagnosis not present

## 2018-01-01 DIAGNOSIS — Z888 Allergy status to other drugs, medicaments and biological substances status: Secondary | ICD-10-CM | POA: Diagnosis not present

## 2018-01-01 DIAGNOSIS — G8929 Other chronic pain: Secondary | ICD-10-CM | POA: Diagnosis present

## 2018-01-01 DIAGNOSIS — Z9889 Other specified postprocedural states: Secondary | ICD-10-CM | POA: Insufficient documentation

## 2018-01-01 DIAGNOSIS — Z981 Arthrodesis status: Secondary | ICD-10-CM | POA: Diagnosis not present

## 2018-01-01 DIAGNOSIS — M899 Disorder of bone, unspecified: Secondary | ICD-10-CM | POA: Diagnosis not present

## 2018-01-01 DIAGNOSIS — Z789 Other specified health status: Secondary | ICD-10-CM | POA: Diagnosis not present

## 2018-01-01 DIAGNOSIS — M79604 Pain in right leg: Secondary | ICD-10-CM | POA: Insufficient documentation

## 2018-01-01 DIAGNOSIS — M79605 Pain in left leg: Secondary | ICD-10-CM | POA: Diagnosis not present

## 2018-01-01 DIAGNOSIS — Z886 Allergy status to analgesic agent status: Secondary | ICD-10-CM | POA: Diagnosis not present

## 2018-01-01 DIAGNOSIS — Z90710 Acquired absence of both cervix and uterus: Secondary | ICD-10-CM | POA: Insufficient documentation

## 2018-01-01 DIAGNOSIS — Z9851 Tubal ligation status: Secondary | ICD-10-CM | POA: Insufficient documentation

## 2018-01-01 DIAGNOSIS — Z79899 Other long term (current) drug therapy: Secondary | ICD-10-CM | POA: Insufficient documentation

## 2018-01-01 DIAGNOSIS — M549 Dorsalgia, unspecified: Secondary | ICD-10-CM | POA: Diagnosis not present

## 2018-01-01 DIAGNOSIS — M5136 Other intervertebral disc degeneration, lumbar region: Secondary | ICD-10-CM | POA: Diagnosis not present

## 2018-01-01 NOTE — Progress Notes (Signed)
Patient's Name: Laura Frey  MRN: [id number]  Referring Provider: Marinda Elk, MD  DOB: 09/28/1932  PCP: Marinda Elk, MD  DOS: 01/01/2018  Note by: Gaspar Cola, MD  Service setting: Ambulatory outpatient  Specialty: Interventional Pain Management  Location: [facility] (AMB) Pain Management Facility    Patient type: Established "FAST-TRACK"   Primary Reason(s) for Visit: Encounter for post-procedure evaluation of chronic illness with mild to moderate exacerbation, as well as review of chronic illnesses with exacerbation, or progression (Level of risk: moderate) CC: Back Pain (low) and Leg Pain (bilateral)  HPI  Laura Frey is a 84 y.o. year old, female patient, who comes today for a post-procedure evaluation. She has Hypotension, unspecified; UTI (urinary tract infection); Unspecified hypothyroidism; GERD (gastroesophageal reflux disease); Nonspecific (abnormal) findings on radiological and other examination of gastrointestinal tract; Shortness of breath; HTN (hypertension); Anemia; SOB (shortness of breath); Diastolic CHF, acute (HCC); COPD (chronic obstructive pulmonary disease) ([place]); Hypoxia; Increased frequency of urination; Other chronic cystitis without hematuria; Recurrent UTI; Thalassemia; Urge incontinence; Anemia, unspecified; Acute diastolic heart failure ([place]); Esophageal reflux; Hypertension; Hypothyroid; Urinary tract infection; Abnormal MRI, lumbar spine (2018); L2 compression fracture; H/O cervical spine surgery (C4-C7 ACDF); Abnormal MRI, cervical spine (2014); Chronic low back pain (Bilateral); Lumbar facet joint osteoarthritis (Bilateral); Lumbar facet hypertrophy (Bilateral); Lumbar facet syndrome (Bilateral); Osteoarthritis of lumbar spine; Lumbar spondylosis; Cervical spondylosis; Cervical facet arthropathy (Bilateral); Chronic pain syndrome; Osteoarthritis; Grade 1 anterolisthesis of L3 over L4; Grade 1 retrolisthesis of L5 over S1; Lumbar lateral recess stenosis  (Bilateral); Chronic lower extremity pain (Primary Area of Pain) (Bilateral) (R>L); Chronic lower extremity radicular pain (Bilateral) (R>L); Chronic lumbar radiculitis (Right); DDD (degenerative disc disease), lumbar; DDD (degenerative disc disease), cervical; Frequent falls; Gait instability; Disorder of skeletal system; Pharmacologic therapy; Problems influencing health status; Cervicalgia; Chronic upper back pain; and Lower extremity weakness (Bilateral) on their problem list. Her primarily concern today is the Back Pain (low) and Leg Pain (bilateral)  Pain Assessment: Location: Lower Back Radiating: legs Onset: More than a month ago Duration: Chronic pain Quality: Hervey Ard(" it just hurts") Severity: 7 /10 (self-reported pain score)  Note: Reported level is inconsistent with clinical observations. Clinically the patient looks like a 2/10 A 2/10 is viewed as "Mild to Moderate" and described as noticeable and distracting. Impossible to hide from other people. More frequent flare-ups. Still possible to adapt and function close to normal. It can be very annoying and may have occasional stronger flare-ups. With discipline, patients may get used to it and adapt. Information on the proper use of the pain scale provided to the patient today. When using our objective Pain Scale, levels between 6 and 10/10 are said to belong in an emergency room, as it progressively worsens from a 6/10, described as severely limiting, requiring emergency care not usually available at an outpatient pain management facility. At a 6/10 level, communication becomes difficult and requires great effort. Assistance to reach the emergency department may be required. Facial flushing and profuse sweating along with potentially dangerous increases in heart rate and blood pressure will be evident. Timing: Intermittent Modifying factors: procedures, rest, medications, heat  Laura Frey comes in today for review of her chronic pain problem and  post-procedure evaluation after the treatment done on 11/16/2017.   Further details on both, my assessment(s), as well as the proposed treatment plan, please see below.  Post-Procedure Assessment  11/16/2017 Procedure: Diagnostic right-sided L3-4 interlaminar lumbar epidural steroid injection #1 under fluoroscopic guidance, no sedation Pre-procedure  pain score:  8/10 Post-procedure pain score: 0/10 (100% relief) Influential Factors: BMI: 30.38 kg/m Intra-procedural challenges: None observed.         Assessment challenges: Results reported today are inconsistent with those reported on procedure day.        Before discharge, the patient had previously reported 100% relief of the pain. Reported side-effects: None.        Post-procedural adverse reactions or complications: None reported         Sedation: No sedation used. When no sedatives are used, the analgesic levels obtained are directly associated to the effectiveness of the local anesthetics. However, when sedation is provided, the level of analgesia obtained during the initial 1 hour following the intervention, is believed to be the result of a combination of factors. These factors may include, but are not limited to: 1. The effectiveness of the local anesthetics used. 2. The effects of the analgesic(s) and/or anxiolytic(s) used. 3. The degree of discomfort experienced by the patient at the time of the procedure. 4. The patients ability and reliability in recalling and recording the events. 5. The presence and influence of possible secondary gains and/or psychosocial factors. Reported result: Relief experienced during the 1st hour after the procedure: 80 % (Ultra-Short Term Relief)            Interpretative annotation: Clinically appropriate result. No IV Analgesic or Anxiolytic given, therefore benefits are completely due to Local Anesthetic effects.          Effects of local anesthetic: The analgesic effects attained during this period  are directly associated to the localized infiltration of local anesthetics and therefore cary significant diagnostic value as to the etiological location, or anatomical origin, of the pain. Expected duration of relief is directly dependent on the pharmacodynamics of the local anesthetic used. Long-acting (4-6 hours) anesthetics used.  Reported result: Relief during the next 4 to 6 hour after the procedure: 80 % (Short-Term Relief)            Interpretative annotation: Clinically appropriate result. Analgesia during this period is likely to be Local Anesthetic-related.          Long-term benefit: Defined as the period of time past the expected duration of local anesthetics (1 hour for short-acting and 4-6 hours for long-acting). With the possible exception of prolonged sympathetic blockade from the local anesthetics, benefits during this period are typically attributed to, or associated with, other factors such as analgesic sensory neuropraxia, antiinflammatory effects, or beneficial biochemical changes provided by agents other than the local anesthetics.  Reported result: Extended relief following procedure: 80 % (Long-Term Relief)            Interpretative annotation: Clinically appropriate result. Good relief. Therapeutic success. Inflammation plays a part in the etiology to the pain.          Current benefits: Defined as reported results that persistent at this point in time.   Analgesia: >50 % Laura Frey reports that both, extremity and the axial pain improved with the treatment. Function: Somewhat improved ROM: Somewhat improved Interpretative annotation: Unfortunately, 2 days after the treatment, the patient had a fall while she was getting out of bed and some of her pain has come back. Therapeutic benefit observed. Effective therapeutic approach.          Interpretation: Results would suggest a successful diagnostic intervention.                  Plan:  Proceed with treatment No.: 2  Laboratory Chemistry  Inflammation Markers (CRP: Acute Phase) (ESR: Chronic Phase) Lab Results  Component Value Date   LATICACIDVEN 1.3 01/29/2014                         Renal Function Markers Lab Results  Component Value Date   BUN 8 02/08/2014   CREATININE 1.01 02/08/2014   GFRAA 60 (L) 02/08/2014   GFRNONAA 52 (L) 02/08/2014                 Hepatic Function Markers Lab Results  Component Value Date   AST 22 02/05/2014   ALT 13 02/05/2014   ALBUMIN 2.5 (L) 02/05/2014   ALKPHOS 67 02/05/2014                 Electrolytes Lab Results  Component Value Date   NA 140 02/08/2014   K 4.4 02/08/2014   CL 97 02/08/2014   CALCIUM 8.5 02/08/2014   MG 1.9 02/03/2014                        Neuropathy Markers Lab Results  Component Value Date   HGBA1C 6.0 (H) 01/27/2014                 Coagulation Parameters Lab Results  Component Value Date   INR 1.31 01/30/2014   LABPROT 16.0 (H) 01/30/2014   APTT 39 (H) 01/29/2014   PLT 382 02/08/2014   DDIMER 9.34 (H) 02/05/2014                 Cardiovascular Markers Lab Results  Component Value Date   CKTOTAL 206 (H) 02/04/2014   CKMB 10.4 (H) 02/04/2014   TROPONINI <0.30 02/05/2014   HGB 9.1 (L) 02/08/2014   HCT 27.8 (L) 02/08/2014                 Note: Lab results reviewed.  Recent Diagnostic Imaging Review  Cervical Imaging: Cervical DG 2-3 views:  Results for orders placed during the hospital encounter of 05/01/13  DG Cervical Spine 2-3 Views   Narrative *RADIOLOGY REPORT*  Clinical Data: C4-C7 ACDF.  DG C-ARM 1-60 MIN,CERVICAL SPINE - 2-3 VIEW  Comparison: Cervical MRI 02/28/2013.  Findings: Two lateral spot fluoroscopic images of the cervical spine demonstrate anterior discectomy and fusion from C4-C7 with an anterior plate and screws.  Detail is limited by overlap with the shoulders, although no complications are identified.  The alignment is normal.  There are surgical sponges anteriorly within  the surgical bed.  IMPRESSION: No demonstrate complication following T0-V7 ACDF.   Original Report Authenticated By: Richardean Sale, M.D.    Lumbosacral Imaging: Lumbar MR wo contrast:  Results for orders placed during the hospital encounter of 10/12/17  MR LUMBAR SPINE WO CONTRAST   Narrative CLINICAL DATA:  Closed wedge compression fracture second lumbar vertebra initial encounter  EXAM: MRI LUMBAR SPINE WITHOUT CONTRAST  TECHNIQUE: Multiplanar, multisequence MR imaging of the lumbar spine was performed. No intravenous contrast was administered.  COMPARISON:  Lumbar MRI 07/11/2012.  FINDINGS: Segmentation:  Normal.  Lowest disc space L5-S1  Alignment:  Mild anterolisthesis L3-4.  Mild retrolisthesis L5-S1  Vertebrae: Mild compression fracture superior endplate of L2 with bone marrow edema suggesting recent fracture and healing. This appears benign. Superior endplate fracture of L4 on the right appears chronic.  Conus medullaris and cauda equina: Conus extends to the L1-2 level. Conus and cauda equina appear normal.  Paraspinal and  other soft tissues: Negative for mass or adenopathy  Disc levels:  T12-L1:  Mild disc degeneration and Schmorl's node without stenosis  L1-2:  Disc bulging and facet hypertrophy with mild spinal stenosis  L2-3:  Mild disc and facet degeneration with mild spinal stenosis  L3-4: Mild anterolisthesis. Diffuse disc bulging and moderate facet hypertrophy. Moderate spinal stenosis with compression of the thecal sac. Progression of spinal stenosis since 2013  L4-5: Central disc protrusion with diffuse disc bulging. Bilateral facet hypertrophy with moderate spinal stenosis. Subarticular stenosis bilaterally. No change from 2013  L5-S1: Diffuse disc bulging and endplate spurring. Moderate spinal stenosis. Moderate subarticular and foraminal stenosis, left greater than right. Left-sided disc protrusion unchanged from  2013.  IMPRESSION: Mild spinal stenosis L1-2 and L2-3  Moderate spinal stenosis L3-4  Moderate spinal stenosis L4-5 with subarticular stenosis bilaterally  Moderate spinal stenosis L5-S1 with subarticular and foraminal stenosis left greater than right  Mild compression fracture L2 with bone marrow edema suggesting recent or healing fracture.   Electronically Signed   By: Franchot Gallo M.D.   On: 10/12/2017 16:12    Complexity Note: Imaging results reviewed. Results shared with Laura Frey, using Layman's terms.                         Meds   Current Outpatient Medications:  .  albuterol (PROVENTIL HFA;VENTOLIN HFA) 108 (90 BASE) MCG/ACT inhaler, Inhale 2 puffs into the lungs every 6 (six) hours as needed for wheezing., Disp: , Rfl:  .  amLODipine (NORVASC) 5 MG tablet, Take 2 tablets (10 mg total) by mouth daily., Disp: 60 tablet, Rfl: 0 .  Cholecalciferol (VITAMIN D) 2000 UNITS tablet, Take 2,000 Units by mouth daily., Disp: , Rfl:  .  estrogens, conjugated, (PREMARIN) 0.45 MG tablet, Take 0.45 mg by mouth daily. , Disp: , Rfl:  .  fexofenadine (ALLEGRA) 180 MG tablet, Take 180 mg by mouth daily., Disp: , Rfl:  .  furosemide (LASIX) 20 MG tablet, Take 1 tablet (20 mg total) by mouth daily., Disp: 30 tablet, Rfl: 0 .  guaifenesin (ROBITUSSIN) 100 MG/5ML syrup, Take 200 mg by mouth 3 (three) times daily as needed for cough., Disp: , Rfl:  .  levothyroxine (SYNTHROID, LEVOTHROID) 75 MCG tablet, Take 75 mcg by mouth daily before breakfast., Disp: , Rfl:  .  loperamide (IMODIUM) 2 MG capsule, Take 1 capsule (2 mg total) by mouth 2 (two) times daily as needed for diarrhea or loose stools., Disp: 20 capsule, Rfl: 0 .  magnesium oxide (MAG-OX) 400 MG tablet, Take 400 mg by mouth daily., Disp: , Rfl:  .  metoprolol (TOPROL-XL) 200 MG 24 hr tablet, Take 200 mg by mouth daily., Disp: , Rfl:  .  Multiple Vitamin (MULTIVITAMIN WITH MINERALS) TABS, Take 1 tablet by mouth daily., Disp: , Rfl:   .  omeprazole (PRILOSEC) 20 MG capsule, Take 20 mg by mouth daily., Disp: , Rfl:  .  potassium chloride SA (K-DUR,KLOR-CON) 20 MEQ tablet, Take 2 tablets (40 mEq total) by mouth 2 (two) times daily with a meal., Disp: 60 tablet, Rfl: 0 .  solifenacin (VESICARE) 5 MG tablet, Take 5 mg by mouth daily., Disp: , Rfl:  .  tolterodine (DETROL LA) 2 MG 24 hr capsule, Take 2 mg by mouth daily., Disp: , Rfl:  .  vitamin C (ASCORBIC ACID) 500 MG tablet, Take 500 mg by mouth daily., Disp: , Rfl:  .  VITAMIN E PO, Take 500  Units by mouth daily., Disp: , Rfl:   ROS  Constitutional: Denies any fever or chills Gastrointestinal: No reported hemesis, hematochezia, vomiting, or acute GI distress Musculoskeletal: Denies any acute onset joint swelling, redness, loss of ROM, or weakness Neurological: No reported episodes of acute onset apraxia, aphasia, dysarthria, agnosia, amnesia, paralysis, loss of coordination, or loss of consciousness  Allergies  Laura Frey is allergic to hydrocodone; oxycodone; zoloft [sertraline hcl]; ciprofloxacin; sulfa antibiotics; tylenol [acetaminophen]; and ultram [tramadol].  [place]  Drug: Laura Frey  reports that she does not use drugs. Alcohol:  reports that she does not drink alcohol. Tobacco:  reports that she quit smoking about 22 years ago. Her smoking use included cigarettes. She has a 30.00 pack-year smoking history. she has never used smokeless tobacco. Medical:  has a past medical history of Anemia, Asthma, Cancer ([place]), Chronic back pain, Depression, GERD (gastroesophageal reflux disease), Headache(784.0), Hypertension, Hypothyroidism, Peripheral vascular disease ([place]), Sepsis ([place]) (01/27/2014), Shortness of breath, and Tendinitis of both rotator cuffs. Surgical: Laura Frey  has a past surgical history that includes Tubal ligation; Appendectomy; Tonsillectomy; Eye surgery (Bilateral); Abdominal hysterectomy; Bladder repair; and Anterior cervical decomp/discectomy fusion (N/A,  05/01/2013). Family: family history includes Thalassemia in her paternal aunt and son.  Constitutional Exam  General appearance: Well nourished, well developed, and well hydrated. In no apparent acute distress Vitals:   01/01/18 1447  BP: (!) 126/58  Pulse: 78  Resp: 18  Temp: 97.9 F (36.6 C)  TempSrc: Oral  SpO2: 96%  Weight: 177 lb (80.3 kg)  Height: 5' 4" (1.626 m)   BMI Assessment: Estimated body mass index is 30.38 kg/m as calculated from the following:   Height as of this encounter: 5' 4" (1.626 m).   Weight as of this encounter: 177 lb (80.3 kg).  BMI interpretation table: BMI level Category Range association with higher incidence of chronic pain  <18 kg/m2 Underweight   18.5-24.9 kg/m2 Ideal body weight   25-29.9 kg/m2 Overweight Increased incidence by 20%  30-34.9 kg/m2 Obese (Class I) Increased incidence by 68%  35-39.9 kg/m2 Severe obesity (Class II) Increased incidence by 136%  >40 kg/m2 Extreme obesity (Class III) Increased incidence by 254%   BMI Readings from Last 4 Encounters:  01/01/18 30.38 kg/m  11/16/17 32.37 kg/m  01/31/14 34.44 kg/m  05/01/13 31.60 kg/m   Wt Readings from Last 4 Encounters:  01/01/18 177 lb (80.3 kg)  11/16/17 177 lb (80.3 kg)  01/31/14 188 lb 4.4 oz (85.4 kg)  05/01/13 184 lb 1.4 oz (83.5 kg)  Psych/Mental status: Alert, oriented x 3 (person, place, & time)       Eyes: PERLA Respiratory: No evidence of acute respiratory distress  Cervical Spine Area Exam  Skin & Axial Inspection: No masses, redness, edema, swelling, or associated skin lesions Alignment: Symmetrical Functional ROM: Unrestricted ROM      Stability: No instability detected Muscle Tone/Strength: Functionally intact. No obvious neuro-muscular anomalies detected. Sensory (Neurological): Unimpaired Palpation: No palpable anomalies              Upper Extremity (UE) Exam    Side: Right upper extremity  Side: Left upper extremity  Skin & Extremity Inspection:  Skin color, temperature, and hair growth are WNL. No peripheral edema or cyanosis. No masses, redness, swelling, asymmetry, or associated skin lesions. No contractures.  Skin & Extremity Inspection: Skin color, temperature, and hair growth are WNL. No peripheral edema or cyanosis. No masses, redness, swelling, asymmetry, or associated skin lesions. No contractures.  Functional ROM: Unrestricted ROM          Functional ROM: Unrestricted ROM          Muscle Tone/Strength: Functionally intact. No obvious neuro-muscular anomalies detected.  Muscle Tone/Strength: Functionally intact. No obvious neuro-muscular anomalies detected.  Sensory (Neurological): Unimpaired          Sensory (Neurological): Unimpaired          Palpation: No palpable anomalies              Palpation: No palpable anomalies              Specialized Test(s): Deferred         Specialized Test(s): Deferred          Thoracic Spine Area Exam  Skin & Axial Inspection: No masses, redness, or swelling Alignment: Symmetrical Functional ROM: Unrestricted ROM Stability: No instability detected Muscle Tone/Strength: Functionally intact. No obvious neuro-muscular anomalies detected. Sensory (Neurological): Unimpaired Muscle strength & Tone: No palpable anomalies  Lumbar Spine Area Exam  Skin & Axial Inspection: No masses, redness, or swelling Alignment: Symmetrical Functional ROM: Decreased ROM      Stability: No instability detected Muscle Tone/Strength: Functionally intact. No obvious neuro-muscular anomalies detected. Sensory (Neurological): Movement-associated pain Palpation: Complains of area being tender to palpation       Provocative Tests: Lumbar Hyperextension and rotation test: Positive bilaterally for facet joint pain. Lumbar Lateral bending test: evaluation deferred today       Patrick's Maneuver: evaluation deferred today                    Gait & Posture Assessment  Ambulation: Patient ambulates using a walker Gait:  Very limited, using assistive device to ambulate Posture: Difficulty standing up straight, due to pain   Lower Extremity Exam    Side: Right lower extremity  Side: Left lower extremity  Skin & Extremity Inspection: Skin color, temperature, and hair growth are WNL. No peripheral edema or cyanosis. No masses, redness, swelling, asymmetry, or associated skin lesions. No contractures.  Skin & Extremity Inspection: Skin color, temperature, and hair growth are WNL. No peripheral edema or cyanosis. No masses, redness, swelling, asymmetry, or associated skin lesions. No contractures.  Functional ROM: Unrestricted ROM          Functional ROM: Unrestricted ROM          Muscle Tone/Strength: Functionally intact. No obvious neuro-muscular anomalies detected.  Muscle Tone/Strength: Functionally intact. No obvious neuro-muscular anomalies detected.  Sensory (Neurological): Unimpaired  Sensory (Neurological): Unimpaired  Palpation: No palpable anomalies  Palpation: No palpable anomalies   Assessment  Primary Diagnosis & Pertinent Problem List: The primary encounter diagnosis was Chronic lower extremity pain (Primary Area of Pain) (Bilateral) (R>L). Diagnoses of Chronic lower extremity radicular pain (Bilateral) (R>L), Chronic lumbar radiculitis (Right), Chronic low back pain (Bilateral), DDD (degenerative disc disease), lumbar, Disorder of skeletal system, Pharmacologic therapy, Problems influencing health status, Cervicalgia, Chronic upper back pain, Chronic pain syndrome, L2 compression fracture, Frequent falls, and Lower extremity weakness (Bilateral) were also pertinent to this visit.  Status Diagnosis  Improving Improving Improving 1. Chronic lower extremity pain (Primary Area of Pain) (Bilateral) (R>L)   2. Chronic lower extremity radicular pain (Bilateral) (R>L)   3. Chronic lumbar radiculitis (Right)   4. Chronic low back pain (Bilateral)   5. DDD (degenerative disc disease), lumbar   6. Disorder of  skeletal system   7. Pharmacologic therapy   8. Problems influencing health status   9.  Cervicalgia   10. Chronic upper back pain   11. Chronic pain syndrome   12. L2 compression fracture   13. Frequent falls   14. Lower extremity weakness (Bilateral)     Problems updated and reviewed during this visit: No problems updated. Plan of Care  Pharmacotherapy (Medications Ordered): No orders of the defined types were placed in this encounter.  Medications administered today: Laura Frey. Laura Frey had no medications administered during this visit.   Procedure Orders     Lumbar Epidural Injection  Lab Orders     Comp. Metabolic Panel (12)     Magnesium     Vitamin B12     Sedimentation rate     25-Hydroxyvitamin D Lcms D2+D3     C-reactive protein  Imaging Orders     DG Cervical Spine Complete     DG Lumbar Spine Complete W/Bend     DG Thoracic Spine 2 View Referral Orders  No referral(s) requested today    Interventional management options: Planned, scheduled, and/or pending:   Diagnostic right-sided L3-4 interlaminar LESI #2    Considering:   Diagnostic right-sided L3-4 interlaminar LESI #2    Palliative PRN treatment(s):   Palliative right-sided L3-4 interlaminar LESI    Provider-requested follow-up: Return for Procedure (no sedation): (R) L3-4 LESI #2.  No future appointments. Primary Care Physician: Marinda Elk, MD Location: [facility] Outpatient Pain Management Facility Note by: Gaspar Cola, MD Date: 01/01/2018; Time: 3:32 PM

## 2018-01-01 NOTE — Patient Instructions (Addendum)
____________________________________________________________________________________________  Preparing for your procedure (without sedation)  Instructions: . Oral Intake: Do not eat or drink anything for at least 3 hours prior to your procedure. . Transportation: Unless otherwise stated by your physician, you may drive yourself after the procedure. . Blood Pressure Medicine: Take your blood pressure medicine with a sip of water the morning of the procedure. . Blood thinners:  . Diabetics on insulin: Notify the staff so that you can be scheduled 1st case in the morning. If your diabetes requires high dose insulin, take only  of your normal insulin dose the morning of the procedure and notify the staff that you have done so. . Preventing infections: Shower with an antibacterial soap the morning of your procedure.  . Build-up your immune system: Take 1000 mg of Vitamin C with every meal (3 times a day) the day prior to your procedure. [place] Kitchen Antibiotics: Inform the staff if you have a condition or reason that requires you to take antibiotics before dental procedures. . Pregnancy: If you are pregnant, call and cancel the procedure. . Sickness: If you have a cold, fever, or any active infections, call and cancel the procedure. . Arrival: You must be in the facility at least 30 minutes prior to your scheduled procedure. . Children: Do not bring any children with you. . Dress appropriately: Bring dark clothing that you would not mind if they get stained. . Valuables: Do not bring any jewelry or valuables.  Procedure appointments are reserved for interventional treatments only. [place] Kitchen No Prescription Refills. . No medication changes will be discussed during procedure appointments. . No disability issues will be discussed.  Remember:  Regular Business hours are:  Monday to Thursday 8:00 AM to 4:00 PM  Provider's Schedule: Milinda Pointer, MD:  Procedure days: Tuesday and Thursday 7:30 AM to 4:00  PM  Gillis Santa, MD:  Procedure days: Monday and Wednesday 7:30 AM to 4:00 PM ____________________________________________________________________________________________   ____________________________________________________________________________________________  Pain Scale  Introduction: The pain score used by this practice is the Verbal Numerical Rating Scale (VNRS-11). This is an 11-point scale. It is for adults and children 10 years or older. There are significant differences in how the pain score is reported, used, and applied. Forget everything you learned in the past and learn this scoring system.  General Information: The scale should reflect your current level of pain. Unless you are specifically asked for the level of your worst pain, or your average pain. If you are asked for one of these two, then it should be understood that it is over the past 24 hours.  Basic Activities of Daily Living (ADL): Personal hygiene, dressing, eating, transferring, and using restroom.  Instructions: Most patients tend to report their level of pain as a combination of two factors, their physical pain and their psychosocial pain. This last one is also known as "suffering" and it is reflection of how physical pain affects you socially and psychologically. From now on, report them separately. From this point on, when asked to report your pain level, report only your physical pain. Use the following table for reference.  Pain Clinic Pain Levels (0-5/10)  Pain Level Score  Description  No Pain 0   Mild pain 1 Nagging, annoying, but does not interfere with basic activities of daily living (ADL). Patients are able to eat, bathe, get dressed, toileting (being able to get on and off the toilet and perform personal hygiene functions), transfer (move in and out of bed or a chair without assistance),  and maintain continence (able to control bladder and bowel functions). Blood pressure and heart rate are  unaffected. A normal heart rate for a healthy adult ranges from 60 to 100 bpm (beats per minute).   Mild to moderate pain 2 Noticeable and distracting. Impossible to hide from other people. More frequent flare-ups. Still possible to adapt and function close to normal. It can be very annoying and may have occasional stronger flare-ups. With discipline, patients may get used to it and adapt.   Moderate pain 3 Interferes significantly with activities of daily living (ADL). It becomes difficult to feed, bathe, get dressed, get on and off the toilet or to perform personal hygiene functions. Difficult to get in and out of bed or a chair without assistance. Very distracting. With effort, it can be ignored when deeply involved in activities.   Moderately severe pain 4 Impossible to ignore for more than a few minutes. With effort, patients may still be able to manage work or participate in some social activities. Very difficult to concentrate. Signs of autonomic nervous system discharge are evident: dilated pupils (mydriasis); mild sweating (diaphoresis); sleep interference. Heart rate becomes elevated (>115 bpm). Diastolic blood pressure (lower number) rises above 100 mmHg. Patients find relief in laying down and not moving.   Severe pain 5 Intense and extremely unpleasant. Associated with frowning face and frequent crying. Pain overwhelms the senses.  Ability to do any activity or maintain social relationships becomes significantly limited. Conversation becomes difficult. Pacing back and forth is common, as getting into a comfortable position is nearly impossible. Pain wakes you up from deep sleep. Physical signs will be obvious: pupillary dilation; increased sweating; goosebumps; brisk reflexes; cold, clammy hands and feet; nausea, vomiting or dry heaves; loss of appetite; significant sleep disturbance with inability to fall asleep or to remain asleep. When persistent, significant weight loss is observed due to  the complete loss of appetite and sleep deprivation.  Blood pressure and heart rate becomes significantly elevated. Caution: If elevated blood pressure triggers a pounding headache associated with blurred vision, then the patient should immediately seek attention at an urgent or emergency care unit, as these may be signs of an impending stroke.    Emergency Department Pain Levels (6-10/10)  Emergency Room Pain 6 Severely limiting. Requires emergency care and should not be seen or managed at an outpatient pain management facility. Communication becomes difficult and requires great effort. Assistance to reach the emergency department may be required. Facial flushing and profuse sweating along with potentially dangerous increases in heart rate and blood pressure will be evident.   Distressing pain 7 Self-care is very difficult. Assistance is required to transport, or use restroom. Assistance to reach the emergency department will be required. Tasks requiring coordination, such as bathing and getting dressed become very difficult.   Disabling pain 8 Self-care is no longer possible. At this level, pain is disabling. The individual is unable to do even the most "basic" activities such as walking, eating, bathing, dressing, transferring to a bed, or toileting. Fine motor skills are lost. It is difficult to think clearly.   Incapacitating pain 9 Pain becomes incapacitating. Thought processing is no longer possible. Difficult to remember your own name. Control of movement and coordination are lost.   The worst pain imaginable 10 At this level, most patients pass out from pain. When this level is reached, collapse of the autonomic nervous system occurs, leading to a sudden drop in blood pressure and heart rate. This in turn results  in a temporary and dramatic drop in blood flow to the brain, leading to a loss of consciousness. Fainting is one of the body's self defense mechanisms. Passing out puts the brain in a  calmed state and causes it to shut down for a while, in order to begin the healing process.    Summary: 1. Refer to this scale when providing [place] with your pain level. 2. Be accurate and careful when reporting your pain level. This will help with your care. 3. Over-reporting your pain level will lead to loss of credibility. 4. Even a level of 1/10 means that there is pain and will be treated at our facility. 5. High, inaccurate reporting will be documented as "Symptom Exaggeration", leading to loss of credibility and suspicions of possible secondary gains such as obtaining more narcotics, or wanting to appear disabled, for fraudulent reasons. 6. Only pain levels of 5 or below will be seen at our facility. 7. Pain levels of 6 and above will be sent to the Emergency Department and the appointment cancelled. ____________________________________________________________________________________________   Pain Management Discharge Instructions  General Discharge Instructions :  If you need to reach your doctor call: Monday-Friday 8:00 am - 4:00 pm at [phone] or toll free [phone].  After clinic hours [phone] to have operator reach doctor.  Bring all of your medication bottles to all your appointments in the pain clinic.  To cancel or reschedule your appointment with Pain Management please remember to call 24 hours in advance to avoid a fee.  Refer to the educational materials which you have been given on: General Risks, I had my Procedure. Discharge Instructions, Post Sedation.  Post Procedure Instructions:  The drugs you were given will stay in your system until tomorrow, so for the next 24 hours you should not drive, make any legal decisions or drink any alcoholic beverages.  You may eat anything you prefer, but it is better to start with liquids then soups and crackers, and gradually work up to solid foods.  Please notify your doctor immediately if you have any unusual  bleeding, trouble breathing or pain that is not related to your normal pain.  Depending on the type of procedure that was done, some parts of your body may feel week and/or numb.  This usually clears up by tonight or the next day.  Walk with the use of an assistive device or accompanied by an adult for the 24 hours.  You may use ice on the affected area for the first 24 hours.  Put ice in a Ziploc bag and cover with a towel and place against area 15 minutes on 15 minutes off.  You may switch to heat after 24 hours.Epidural Steroid Injection Patient Information  Description: The epidural space surrounds the nerves as they exit the spinal cord.  In some patients, the nerves can be compressed and inflamed by a bulging disc or a tight spinal canal (spinal stenosis).  By injecting steroids into the epidural space, we can bring irritated nerves into direct contact with a potentially helpful medication.  These steroids act directly on the irritated nerves and can reduce swelling and inflammation which often leads to decreased pain.  Epidural steroids may be injected anywhere along the spine and from the neck to the low back depending upon the location of your pain.   After numbing the skin with local anesthetic (like Novocaine), a small needle is passed into the epidural space slowly.  You may experience a sensation of pressure while  this is being done.  The entire block usually last less than 10 minutes.  Conditions which may be treated by epidural steroids:   Low back and leg pain  Neck and arm pain  Spinal stenosis  Post-laminectomy syndrome  Herpes zoster (shingles) pain  Pain from compression fractures  Preparation for the injection:  1. Do not eat any solid food or dairy products within 8 hours of your appointment.  2. You may drink clear liquids up to 3 hours before appointment.  Clear liquids include water, black coffee, juice or soda.  No milk or cream please. 3. You may take your  regular medication, including pain medications, with a sip of water before your appointment  Diabetics should hold regular insulin (if taken separately) and take 1/2 normal NPH dos the morning of the procedure.  Carry some sugar containing items with you to your appointment. 4. A driver must accompany you and be prepared to drive you home after your procedure.  5. Bring all your current medications with your. 6. An IV may be inserted and sedation may be given at the discretion of the physician.   7. A blood pressure cuff, EKG and other monitors will often be applied during the procedure.  Some patients may need to have extra oxygen administered for a short period. 8. You will be asked to provide medical information, including your allergies, prior to the procedure.  We must know immediately if you are taking blood thinners (like Coumadin/Warfarin)  Or if you are allergic to IV iodine contrast (dye). We must know if you could possible be pregnant.  Possible side-effects:  Bleeding from needle site  Infection (rare, may require surgery)  Nerve injury (rare)  Numbness & tingling (temporary)  Difficulty urinating (rare, temporary)  Spinal headache ( a headache worse with upright posture)  Light -headedness (temporary)  Pain at injection site (several days)  Decreased blood pressure (temporary)  Weakness in arm/leg (temporary)  Pressure sensation in back/neck (temporary)  Call if you experience:  Fever/chills associated with headache or increased back/neck pain.  Headache worsened by an upright position.  New onset weakness or numbness of an extremity below the injection site  Hives or difficulty breathing (go to the emergency room)  Inflammation or drainage at the infection site  Severe back/neck pain  Any new symptoms which are concerning to you  Please note:  Although the local anesthetic injected can often make your back or neck feel good for several hours after the  injection, the pain will likely return.  It takes 3-7 days for steroids to work in the epidural space.  You may not notice any pain relief for at least that one week.  If effective, we will often do a series of three injections spaced 3-6 weeks apart to maximally decrease your pain.  After the initial series, we generally will wait several months before considering a repeat injection of the same type.  If you have any questions, please call [phone] [facility]

## 2018-01-01 NOTE — Progress Notes (Signed)
Safety precautions to be maintained throughout the outpatient stay will include: orient to surroundings, keep bed in low position, maintain call bell within reach at all times, provide assistance with transfer out of bed and ambulation.  

## 2018-01-06 LAB — COMP. METABOLIC PANEL (12)
A/G RATIO: 1.8 (ref 1.2–2.2)
ALK PHOS: 91 IU/L (ref 39–117)
AST: 23 IU/L (ref 0–40)
Albumin: 4.5 g/dL (ref 3.5–4.7)
BUN/Creatinine Ratio: 19 (ref 12–28)
BUN: 19 mg/dL (ref 8–27)
Bilirubin Total: 0.2 mg/dL (ref 0.0–1.2)
CALCIUM: 9.3 mg/dL (ref 8.7–10.3)
CHLORIDE: 96 mmol/L (ref 96–106)
Creatinine, Ser: 0.98 mg/dL (ref 0.57–1.00)
GFR calc non Af Amer: 54 mL/min/{1.73_m2} — ABNORMAL LOW (ref >59)
GFR, EST AFRICAN AMERICAN: 62 mL/min/{1.73_m2} (ref >59)
GLUCOSE: 91 mg/dL (ref 65–99)
Globulin, Total: 2.5 g/dL (ref 1.5–4.5)
POTASSIUM: 4.2 mmol/L (ref 3.5–5.2)
Sodium: 139 mmol/L (ref 134–144)
Total Protein: 7 g/dL (ref 6.0–8.5)

## 2018-01-06 LAB — 25-HYDROXY VITAMIN D LCMS D2+D3
25-Hydroxy, Vitamin D-2: <1 ng/mL
25-Hydroxy, Vitamin D-3: 28 ng/mL

## 2018-01-06 LAB — MAGNESIUM: Magnesium: 2.2 mg/dL (ref 1.6–2.3)

## 2018-01-06 LAB — C-REACTIVE PROTEIN: CRP: 1 mg/L (ref 0.0–4.9)

## 2018-01-06 LAB — SEDIMENTATION RATE: Sed Rate: 12 mm/hr (ref 0–40)

## 2018-01-06 LAB — VITAMIN B12: VITAMIN B 12: 365 pg/mL (ref 232–1245)

## 2018-01-06 LAB — 25-HYDROXYVITAMIN D LCMS D2+D3: 25-HYDROXY, VITAMIN D: 29 ng/mL — AB

## 2018-02-06 ENCOUNTER — Telehealth: Payer: Self-pay | Admitting: Pain Medicine

## 2018-02-06 NOTE — Telephone Encounter (Signed)
Attempted to call patient to give results. Message left.

## 2018-02-06 NOTE — Telephone Encounter (Signed)
Patient would like to get results of her tests and xrays

## 2018-02-27 ENCOUNTER — Other Ambulatory Visit: Payer: Self-pay

## 2018-02-27 ENCOUNTER — Ambulatory Visit (HOSPITAL_BASED_OUTPATIENT_CLINIC_OR_DEPARTMENT_OTHER): Payer: Medicare Other | Admitting: Pain Medicine

## 2018-02-27 ENCOUNTER — Ambulatory Visit
Admission: RE | Admit: 2018-02-27 | Discharge: 2018-02-27 | Disposition: A | Discharge status: Home / Self Care | Payer: Medicare Other | Source: Ambulatory Visit | Attending: Pain Medicine | Admitting: Pain Medicine

## 2018-02-27 ENCOUNTER — Encounter: Payer: Self-pay | Admitting: Pain Medicine

## 2018-02-27 VITALS — BP 126/68 | HR 77 | Temp 98.4°F | Resp 17 | Ht 62.0 in | Wt 177.0 lb

## 2018-02-27 DIAGNOSIS — Z882 Allergy status to sulfonamides status: Secondary | ICD-10-CM | POA: Insufficient documentation

## 2018-02-27 DIAGNOSIS — Z885 Allergy status to narcotic agent status: Secondary | ICD-10-CM | POA: Insufficient documentation

## 2018-02-27 DIAGNOSIS — G8929 Other chronic pain: Secondary | ICD-10-CM

## 2018-02-27 DIAGNOSIS — M4316 Spondylolisthesis, lumbar region: Secondary | ICD-10-CM | POA: Insufficient documentation

## 2018-02-27 DIAGNOSIS — Z886 Allergy status to analgesic agent status: Secondary | ICD-10-CM | POA: Insufficient documentation

## 2018-02-27 DIAGNOSIS — Z90710 Acquired absence of both cervix and uterus: Secondary | ICD-10-CM | POA: Insufficient documentation

## 2018-02-27 DIAGNOSIS — M5116 Intervertebral disc disorders with radiculopathy, lumbar region: Secondary | ICD-10-CM | POA: Insufficient documentation

## 2018-02-27 DIAGNOSIS — Z79890 Hormone replacement therapy (postmenopausal): Secondary | ICD-10-CM | POA: Diagnosis not present

## 2018-02-27 DIAGNOSIS — M545 Low back pain: Secondary | ICD-10-CM | POA: Diagnosis present

## 2018-02-27 DIAGNOSIS — M79605 Pain in left leg: Secondary | ICD-10-CM

## 2018-02-27 DIAGNOSIS — Z888 Allergy status to other drugs, medicaments and biological substances status: Secondary | ICD-10-CM | POA: Diagnosis not present

## 2018-02-27 DIAGNOSIS — M4310 Spondylolisthesis, site unspecified: Secondary | ICD-10-CM

## 2018-02-27 DIAGNOSIS — Z881 Allergy status to other antibiotic agents status: Secondary | ICD-10-CM | POA: Insufficient documentation

## 2018-02-27 DIAGNOSIS — M5136 Other intervertebral disc degeneration, lumbar region: Secondary | ICD-10-CM | POA: Diagnosis not present

## 2018-02-27 DIAGNOSIS — M5410 Radiculopathy, site unspecified: Secondary | ICD-10-CM

## 2018-02-27 DIAGNOSIS — Z79899 Other long term (current) drug therapy: Secondary | ICD-10-CM | POA: Diagnosis not present

## 2018-02-27 DIAGNOSIS — Z981 Arthrodesis status: Secondary | ICD-10-CM | POA: Diagnosis not present

## 2018-02-27 DIAGNOSIS — M79604 Pain in right leg: Secondary | ICD-10-CM

## 2018-02-27 MED ORDER — MIDAZOLAM HCL 5 MG/5ML IJ SOLN: 1.0000 mg | INTRAMUSCULAR | Status: DC | PRN

## 2018-02-27 MED ORDER — ROPIVACAINE HCL 2 MG/ML IJ SOLN
1.0000 mL | Freq: Once | INTRAMUSCULAR | Status: AC
  Administered 2018-02-27: 10 mL via EPIDURAL
  Filled 2018-02-27: qty 10

## 2018-02-27 MED ORDER — LACTATED RINGERS IV SOLN: 1000.0000 mL | Freq: Once | INTRAVENOUS | Status: DC

## 2018-02-27 MED ORDER — SODIUM CHLORIDE 0.9 % IJ SOLN
INTRAMUSCULAR | Status: AC
  Filled 2018-02-27: qty 10

## 2018-02-27 MED ORDER — FENTANYL CITRATE (PF) 100 MCG/2ML IJ SOLN: 25.0000 ug | INTRAMUSCULAR | Status: DC | PRN

## 2018-02-27 MED ORDER — DEXAMETHASONE SODIUM PHOSPHATE 10 MG/ML IJ SOLN
10.0000 mg | Freq: Once | INTRAMUSCULAR | Status: AC
  Administered 2018-02-27: 10 mg
  Filled 2018-02-27: qty 1

## 2018-02-27 MED ORDER — IOPAMIDOL (ISOVUE-M 200) INJECTION 41%
10.0000 mL | Freq: Once | INTRAMUSCULAR | Status: AC
  Administered 2018-02-27: 10 mL via EPIDURAL
  Filled 2018-02-27: qty 10

## 2018-02-27 MED ORDER — LIDOCAINE HCL 2 % IJ SOLN
20.0000 mL | Freq: Once | INTRAMUSCULAR | Status: AC
  Administered 2018-02-27: 400 mg
  Filled 2018-02-27: qty 20

## 2018-02-27 MED ORDER — SODIUM CHLORIDE 0.9% FLUSH
1.0000 mL | Freq: Once | INTRAVENOUS | Status: AC
  Administered 2018-02-27: 10 mL

## 2018-02-27 NOTE — Progress Notes (Signed)
Safety precautions to be maintained throughout the outpatient stay will include: orient to surroundings, keep bed in low position, maintain call bell within reach at all times, provide assistance with transfer out of bed and ambulation.  

## 2018-02-27 NOTE — Progress Notes (Signed)
Patient's Name: Laura Frey  MRN: [id number]  Referring Provider: Marinda Elk, MD  DOB: 13-May-1932  PCP: Marinda Elk, MD  DOS: 02/27/2018  Note by: Gaspar Cola, MD  Service setting: Ambulatory outpatient  Specialty: Interventional Pain Management  Patient type: Established  Location: [facility] (AMB) Pain Management Facility  Visit type: Interventional Procedure   Primary Reason for Visit: Interventional Pain Management Treatment. CC: Back Pain (lower)  Procedure:  Anesthesia, Analgesia, Anxiolysis:  Type: Trans-Foraminal Epidural Steroid Injection Purpose: Diagnostic/Therapeutic Region: Posterolateral Lumbosacral Level: L3-4 Level Laterality: Right Paravertebral Target Area: The 6 o'clock position under the pedicle, on the affected side. Approach: Posterior Percutaneous Paravertebral approach. Position: Prone  Type: Local Anesthesia Indication(s): Analgesia         Route: Infiltration ([place]/IM) IV Access: Declined Sedation: Declined  Local Anesthetic: Lidocaine 1-2%   Indications: 1. DDD (degenerative disc disease), lumbar   2. Grade 1 anterolisthesis of L3 over L4   3. Chronic lower extremity radicular pain (Bilateral) (R>L)   4. Chronic lower extremity pain (Primary Area of Pain) (Bilateral) (R>L)    Pain Score: Pre-procedure: 7 /10 Post-procedure: 0-No pain/10  Pre-op Assessment:  Laura Frey is a 84 y.o. (year old), female patient, seen today for interventional treatment. She  has a past surgical history that includes Tubal ligation; Appendectomy; Tonsillectomy; Eye surgery (Bilateral); Abdominal hysterectomy; Bladder repair; and Anterior cervical decomp/discectomy fusion (N/A, 05/01/2013). Laura Frey has a current medication list which includes the following prescription(s): albuterol, amlodipine, vitamin d, estrogens (conjugated), fexofenadine, furosemide, guaifenesin, levothyroxine, loperamide, magnesium oxide, metoprolol, multivitamin with minerals,  omeprazole, potassium chloride sa, solifenacin, tolterodine, vitamin c, and vitamin e. Her primarily concern today is the Back Pain (lower)  Initial Vital Signs:  Pulse/HCG Rate: 77ECG Heart Rate: 81 Temp: 98.4 F (36.9 C) Resp: 16 BP: 125/60 SpO2: 96 %  BMI: Estimated body mass index is 32.37 kg/m as calculated from the following:   Height as of this encounter: 5\' 2"  (1.575 m).   Weight as of this encounter: 177 lb (80.3 kg).  Risk Assessment: Allergies: Reviewed. She is allergic to hydrocodone; oxycodone; zoloft [sertraline hcl]; ciprofloxacin; sulfa antibiotics; tylenol [acetaminophen]; and ultram [tramadol].  Allergy Precautions: None required Coagulopathies: Reviewed. None identified.  Blood-thinner therapy: None at this time Active Infection(s): Reviewed. None identified. Laura Frey is afebrile  Site Confirmation: Laura Frey was asked to confirm the procedure and laterality before marking the site Procedure checklist: Completed Consent: Before the procedure and under the influence of no sedative(s), amnesic(s), or anxiolytics, the patient was informed of the treatment options, risks and possible complications. To fulfill our ethical and legal obligations, as recommended by the American Medical Association's Code of Ethics, I have informed the patient of my clinical impression; the nature and purpose of the treatment or procedure; the risks, benefits, and possible complications of the intervention; the alternatives, including doing nothing; the risk(s) and benefit(s) of the alternative treatment(s) or procedure(s); and the risk(s) and benefit(s) of doing nothing. The patient was provided information about the general risks and possible complications associated with the procedure. These may include, but are not limited to: failure to achieve desired goals, infection, bleeding, organ or nerve damage, allergic reactions, paralysis, and death. In addition, the patient was informed of those  risks and complications associated to Spine-related procedures, such as failure to decrease pain; infection (i.e.: Meningitis, epidural or intraspinal abscess); bleeding (i.e.: epidural hematoma, subarachnoid hemorrhage, or any other type of intraspinal or peri-dural bleeding); organ or nerve damage (  i.e.: Any type of peripheral nerve, nerve root, or spinal cord injury) with subsequent damage to sensory, motor, and/or autonomic systems, resulting in permanent pain, numbness, and/or weakness of one or several areas of the body; allergic reactions; (i.e.: anaphylactic reaction); and/or death. Furthermore, the patient was informed of those risks and complications associated with the medications. These include, but are not limited to: allergic reactions (i.e.: anaphylactic or anaphylactoid reaction(s)); adrenal axis suppression; blood sugar elevation that in diabetics may result in ketoacidosis or comma; water retention that in patients with history of congestive heart failure may result in shortness of breath, pulmonary edema, and decompensation with resultant heart failure; weight gain; swelling or edema; medication-induced neural toxicity; particulate matter embolism and blood vessel occlusion with resultant organ, and/or nervous system infarction; and/or aseptic necrosis of one or more joints. Finally, the patient was informed that Medicine is not an exact science; therefore, there is also the possibility of unforeseen or unpredictable risks and/or possible complications that may result in a catastrophic outcome. The patient indicated having understood very clearly. We have given the patient no guarantees and we have made no promises. Enough time was given to the patient to ask questions, all of which were answered to the patient's satisfaction. Laura Frey has indicated that she wanted to continue with the procedure. Attestation: I, the ordering provider, attest that I have discussed with the patient the benefits,  risks, side-effects, alternatives, likelihood of achieving goals, and potential problems during recovery for the procedure that I have provided informed consent. Date  Time: 02/27/2018 12:47 PM  Pre-Procedure Preparation:  Monitoring: As per clinic protocol. Respiration, ETCO2, SpO2, BP, heart rate and rhythm monitor placed and checked for adequate function Safety Precautions: Patient was assessed for positional comfort and pressure points before starting the procedure. Time-out: I initiated and conducted the "Time-out" before starting the procedure, as per protocol. The patient was asked to participate by confirming the accuracy of the "Time Out" information. Verification of the correct person, site, and procedure were performed and confirmed by me, the nursing staff, and the patient. "Time-out" conducted as per Joint Commission's Universal Protocol (UP.01.01.01). Time: 1344  Description of Procedure:       Area Prepped: Entire Posterior Lumbosacral Area Prepping solution: ChloraPrep (2% chlorhexidine gluconate and 70% isopropyl alcohol) Safety Precautions: Aspiration looking for blood return was conducted prior to all injections. At no point did we inject any substances, as a needle was being advanced. No attempts were made at seeking any paresthesias. Safe injection practices and needle disposal techniques used. Medications properly checked for expiration dates. SDV (single dose vial) medications used. Description of the Procedure: Protocol guidelines were followed. The patient was placed in position over the procedure table. The target area was identified and the area prepped in the usual manner. Skin desensitized using vapocoolant spray. Skin & deeper tissues infiltrated with local anesthetic. Appropriate amount of time allowed to pass for local anesthetics to take effect. The procedure needles were then advanced to the target area. Proper needle placement secured. Negative aspiration confirmed.  Solution injected in intermittent fashion, asking for systemic symptoms every 0.5cc of injectate. The needles were then removed and the area cleansed, making sure to leave some of the prepping solution back to take advantage of its long term bactericidal properties. Vitals:   02/27/18 1335 02/27/18 1340 02/27/18 1345 02/27/18 1352  BP: 127/68 134/63 122/62 126/68  Pulse:      Resp: 15 17 12 17   Temp:  TempSrc:      SpO2: 100% 98% 98% 96%  Weight:      Height:        Start Time: 1344 hrs. End Time: 1351 hrs. Materials:  Needle(s) Type: Regular needle Gauge: 22G Length: 3.5-in Medication(s): Please see orders for medications and dosing details.  Imaging Guidance (Spinal):  Type of Imaging Technique: Fluoroscopy Guidance (Spinal) Indication(s): Assistance in needle guidance and placement for procedures requiring needle placement in or near specific anatomical locations not easily accessible without such assistance. Exposure Time: Please see nurses notes. Contrast: Before injecting any contrast, we confirmed that the patient did not have an allergy to iodine, shellfish, or radiological contrast. Once satisfactory needle placement was completed at the desired level, radiological contrast was injected. Contrast injected under live fluoroscopy. No contrast complications. See chart for type and volume of contrast used. Fluoroscopic Guidance: I was personally present during the use of fluoroscopy. "Tunnel Vision Technique" used to obtain the best possible view of the target area. Parallax error corrected before commencing the procedure. "Direction-depth-direction" technique used to introduce the needle under continuous pulsed fluoroscopy. Once target was reached, antero-posterior, oblique, and lateral fluoroscopic projection used confirm needle placement in all planes. Images permanently stored in EMR. Interpretation: I personally interpreted the imaging intraoperatively. Adequate needle  placement confirmed in multiple planes. Appropriate spread of contrast into desired area was observed. No evidence of afferent or efferent intravascular uptake. No intrathecal or subarachnoid spread observed. Permanent images saved into the patient's record.  Antibiotic Prophylaxis:   Anti-infectives (From admission, onward)   None     Indication(s): None identified  Post-operative Assessment:  Post-procedure Vital Signs:  Pulse/HCG Rate: 7779 Temp: 98.4 F (36.9 C) Resp: 17 BP: 126/68 SpO2: 96 %  EBL: None  Complications: No immediate post-treatment complications observed by team, or reported by patient.  Note: The patient tolerated the entire procedure well. A repeat set of vitals were taken after the procedure and the patient was kept under observation following institutional policy, for this type of procedure. Post-procedural neurological assessment was performed, showing return to baseline, prior to discharge. The patient was provided with post-procedure discharge instructions, including a section on how to identify potential problems. Should any problems arise concerning this procedure, the patient was given instructions to immediately contact us, at any time, without hesitation. In any case, we plan to contact the patient by telephone for a follow-up status report regarding this interventional procedure.  Comments:  No additional relevant information.  Plan of Care    Imaging Orders     DG C-Arm 1-60 Min-No Report  Procedure Orders     Lumbar Transforaminal Epidural  Medications ordered for procedure: Meds ordered this encounter  Medications  . iopamidol (ISOVUE-M) 41 % intrathecal injection 10 mL    Must be Myelogram-compatible. If not available, you may substitute with a water-soluble, non-ionic, hypoallergenic, myelogram-compatible radiological contrast medium.  Marland Kitchen lidocaine (XYLOCAINE) 2 % (with pres) injection 400 mg  . sodium chloride flush (NS) 0.9 % injection 1  mL  . ropivacaine (PF) 2 mg/mL (0.2%) (NAROPIN) injection 1 mL  . dexamethasone (DECADRON) injection 10 mg   Medications administered: We administered iopamidol, lidocaine, sodium chloride flush, ropivacaine (PF) 2 mg/mL (0.2%), and dexamethasone.  See the medical record for exact dosing, route, and time of administration.  New Prescriptions   No medications on file   Disposition: Discharge home  Discharge Date & Time: 02/27/2018; 1358 hrs.   Physician-requested Follow-up: Return for post-procedure eval (2 wks),  w/ Dr. Dossie Arbour.  Future Appointments  Date Time Provider [place]  03/26/2018  1:15 PM Milinda Pointer, MD [facility] None   Primary Care Physician: Marinda Elk, MD Location: [facility] Note by: Gaspar Cola, MD Date: 02/27/2018; Time: 2:03 PM  Disclaimer:  Medicine is not an Chief Strategy Officer. The only guarantee in medicine is that nothing is guaranteed. It is important to note that the decision to proceed with this intervention was based on the information collected from the patient. The Data and conclusions were drawn from the patient's questionnaire, the interview, and the physical examination. Because the information was provided in large part by the patient, it cannot be guaranteed that it has not been purposely or unconsciously manipulated. Every effort has been made to obtain as much relevant data as possible for this evaluation. It is important to note that the conclusions that lead to this procedure are derived in large part from the available data. Always take into account that the treatment will also be dependent on availability of resources and existing treatment guidelines, considered by other Pain Management Practitioners as being common knowledge and practice, at the time of the intervention. For Medico-Legal purposes, it is also important to point out that variation in procedural techniques and pharmacological  choices are the acceptable norm. The indications, contraindications, technique, and results of the above procedure should only be interpreted and judged by a Board-Certified Interventional Pain Specialist with extensive familiarity and expertise in the same exact procedure and technique.

## 2018-02-27 NOTE — Patient Instructions (Signed)

## 2018-02-28 ENCOUNTER — Telehealth: Payer: Self-pay

## 2018-02-28 NOTE — Telephone Encounter (Signed)
lled and spoke to son.  He states patient is asleep.  Instructed him to call us back for any questions or concerns after she wakes up.

## 2018-03-26 ENCOUNTER — Ambulatory Visit: Payer: Medicare Other | Admitting: Pain Medicine

## 2018-03-26 NOTE — Progress Notes (Deleted)
Patient's Name: Joie Hipps  MRN: [id number]  Referring Provider: Marinda Elk, MD  DOB: April 11, 1932  PCP: Marinda Elk, MD  DOS: 03/26/2018  Note by: Gaspar Cola, MD  Service setting: Ambulatory outpatient  Specialty: Interventional Pain Management  Location: [facility] (AMB) Pain Management Facility    Patient type: Established Orland Jarred)    Primary Reason(s) for Visit: Encounter for post-procedure evaluation of chronic illness with mild to moderate exacerbation CC: No chief complaint on file.  HPI  Ms. Bamba is a 84 y.o. year old, female patient, who comes today for a post-procedure evaluation. She has Hypotension, unspecified; UTI (urinary tract infection); Unspecified hypothyroidism; GERD (gastroesophageal reflux disease); Nonspecific (abnormal) findings on radiological and other examination of gastrointestinal tract; Shortness of breath; HTN (hypertension); Anemia; SOB (shortness of breath); Diastolic CHF, acute (HCC); COPD (chronic obstructive pulmonary disease) ([place]); Hypoxia; Increased frequency of urination; Other chronic cystitis without hematuria; Recurrent UTI; Thalassemia; Urge incontinence; Anemia, unspecified; Acute diastolic heart failure ([place]); Esophageal reflux; Hypertension; Hypothyroid; Urinary tract infection; Abnormal MRI, lumbar spine (2018); L2 compression fracture; H/O cervical spine surgery (C4-C7 ACDF); Abnormal MRI, cervical spine (2014); Chronic low back pain (Bilateral); Lumbar facet joint osteoarthritis (Bilateral); Lumbar facet hypertrophy (Bilateral); Lumbar facet syndrome (Bilateral); Osteoarthritis of lumbar spine; Lumbar spondylosis; Cervical spondylosis; Cervical facet arthropathy (Bilateral); Chronic pain syndrome; Osteoarthritis; Grade 1 anterolisthesis of L3 over L4; Grade 1 retrolisthesis of L5 over S1; Lumbar lateral recess stenosis (Bilateral); Chronic lower extremity pain (Primary Area of Pain) (Bilateral) (R>L); Chronic lower extremity  radicular pain (Bilateral) (R>L); Chronic lumbar radiculitis (Right); DDD (degenerative disc disease), lumbar; DDD (degenerative disc disease), cervical; Frequent falls; Gait instability; Disorder of skeletal system; Pharmacologic therapy; Problems influencing health status; Cervicalgia; Chronic upper back pain; and Lower extremity weakness (Bilateral) on their problem list. Her primarily concern today is the No chief complaint on file.  Pain Assessment: Location:     Radiating:   Onset:   Duration:   Quality:   Severity:  /10 (subjective, self-reported pain score)  Note: Reported level is compatible with observation.                         When using our objective Pain Scale, levels between 6 and 10/10 are said to belong in an emergency room, as it progressively worsens from a 6/10, described as severely limiting, requiring emergency care not usually available at an outpatient pain management facility. At a 6/10 level, communication becomes difficult and requires great effort. Assistance to reach the emergency department may be required. Facial flushing and profuse sweating along with potentially dangerous increases in heart rate and blood pressure will be evident. Effect on ADL:   Timing:   Modifying factors:   BP:    HR:    Ms. Cairns comes in today for post-procedure evaluation after the treatment done on 02/28/2018.  Further details on both, my assessment(s), as well as the proposed treatment plan, please see below.  Post-Procedure Assessment  02/27/2018 Procedure: Diagnostic/therapeutic right-sided L3-4 transforaminal epidural steroid injection #2 under fluoroscopic guidance, no sedation Pre-procedure pain score:  7/10 Post-procedure pain score: 0/10 (100% relief) Influential Factors: BMI:   Intra-procedural challenges: None observed.         Assessment challenges: None detected.              Reported side-effects: None.        Post-procedural adverse reactions or complications: None  reported  Sedation: No sedation used. When no sedatives are used, the analgesic levels obtained are directly associated to the effectiveness of the local anesthetics. However, when sedation is provided, the level of analgesia obtained during the initial 1 hour following the intervention, is believed to be the result of a combination of factors. These factors may include, but are not limited to: 1. The effectiveness of the local anesthetics used. 2. The effects of the analgesic(s) and/or anxiolytic(s) used. 3. The degree of discomfort experienced by the patient at the time of the procedure. 4. The patients ability and reliability in recalling and recording the events. 5. The presence and influence of possible secondary gains and/or psychosocial factors. Reported result: Relief experienced during the 1st hour after the procedure:   (Ultra-Short Term Relief)            Interpretative annotation: Clinically appropriate result. No IV Analgesic or Anxiolytic given, therefore benefits are completely due to Local Anesthetic effects.          Effects of local anesthetic: The analgesic effects attained during this period are directly associated to the localized infiltration of local anesthetics and therefore cary significant diagnostic value as to the etiological location, or anatomical origin, of the pain. Expected duration of relief is directly dependent on the pharmacodynamics of the local anesthetic used. Long-acting (4-6 hours) anesthetics used.  Reported result: Relief during the next 4 to 6 hour after the procedure:   (Short-Term Relief)            Interpretative annotation: Clinically appropriate result. Analgesia during this period is likely to be Local Anesthetic-related.          Long-term benefit: Defined as the period of time past the expected duration of local anesthetics (1 hour for short-acting and 4-6 hours for long-acting). With the possible exception of prolonged sympathetic blockade  from the local anesthetics, benefits during this period are typically attributed to, or associated with, other factors such as analgesic sensory neuropraxia, antiinflammatory effects, or beneficial biochemical changes provided by agents other than the local anesthetics.  Reported result: Extended relief following procedure:   (Long-Term Relief)            Interpretative annotation: Clinically appropriate result. Good relief. No permanent benefit expected. Inflammation plays a part in the etiology to the pain.          Current benefits: Defined as reported results that persistent at this point in time.   Analgesia: *** %            Function: Somewhat improved ROM: Somewhat improved Interpretative annotation: Recurrence of symptoms. No permanent benefit expected. Effective diagnostic intervention.          Interpretation: Results would suggest a successful diagnostic intervention.                  Plan:  Please see "Plan of Care" for details.                Laboratory Chemistry  Inflammation Markers (CRP: Acute Phase) (ESR: Chronic Phase) Lab Results  Component Value Date   CRP 1.0 01/01/2018   ESRSEDRATE 12 01/01/2018   LATICACIDVEN 1.3 01/29/2014                         Renal Markers Lab Results  Component Value Date   BUN 19 01/01/2018   CREATININE 0.98 01/01/2018   BCR 19 01/01/2018   GFRAA 62 01/01/2018   GFRNONAA 54 (L) 01/01/2018  Hepatic Markers Lab Results  Component Value Date   AST 23 01/01/2018   ALT 13 02/05/2014   ALBUMIN 4.5 01/01/2018                        Neuropathy Markers Lab Results  Component Value Date   HGBA1C 6.0 (H) 01/27/2014                        Hematology Parameters Lab Results  Component Value Date   INR 1.31 01/30/2014   LABPROT 16.0 (H) 01/30/2014   APTT 39 (H) 01/29/2014   PLT 382 02/08/2014   HGB 9.1 (L) 02/08/2014   HCT 27.8 (L) 02/08/2014                        CV Markers Lab Results  Component  Value Date   CKTOTAL 206 (H) 02/04/2014   CKMB 10.4 (H) 02/04/2014   TROPONINI <0.30 02/05/2014                         Note: Lab results reviewed.  Recent Diagnostic Imaging Results  DG C-Arm 1-60 Min-No Report Fluoroscopy was utilized by the requesting physician.  No radiographic  interpretation.   Complexity Note: I personally reviewed the fluoroscopic imaging of the procedure.                        Meds   Current Outpatient Medications:  .  albuterol (PROVENTIL HFA;VENTOLIN HFA) 108 (90 BASE) MCG/ACT inhaler, Inhale 2 puffs into the lungs every 6 (six) hours as needed for wheezing., Disp: , Rfl:  .  amLODipine (NORVASC) 5 MG tablet, Take 2 tablets (10 mg total) by mouth daily., Disp: 60 tablet, Rfl: 0 .  Cholecalciferol (VITAMIN D) 2000 UNITS tablet, Take 2,000 Units by mouth daily., Disp: , Rfl:  .  estrogens, conjugated, (PREMARIN) 0.45 MG tablet, Take 0.45 mg by mouth daily. , Disp: , Rfl:  .  fexofenadine (ALLEGRA) 180 MG tablet, Take 180 mg by mouth daily., Disp: , Rfl:  .  furosemide (LASIX) 20 MG tablet, Take 1 tablet (20 mg total) by mouth daily., Disp: 30 tablet, Rfl: 0 .  guaifenesin (ROBITUSSIN) 100 MG/5ML syrup, Take 200 mg by mouth 3 (three) times daily as needed for cough., Disp: , Rfl:  .  levothyroxine (SYNTHROID, LEVOTHROID) 75 MCG tablet, Take 75 mcg by mouth daily before breakfast., Disp: , Rfl:  .  loperamide (IMODIUM) 2 MG capsule, Take 1 capsule (2 mg total) by mouth 2 (two) times daily as needed for diarrhea or loose stools., Disp: 20 capsule, Rfl: 0 .  magnesium oxide (MAG-OX) 400 MG tablet, Take 400 mg by mouth daily., Disp: , Rfl:  .  metoprolol (TOPROL-XL) 200 MG 24 hr tablet, Take 200 mg by mouth daily., Disp: , Rfl:  .  Multiple Vitamin (MULTIVITAMIN WITH MINERALS) TABS, Take 1 tablet by mouth daily., Disp: , Rfl:  .  omeprazole (PRILOSEC) 20 MG capsule, Take 20 mg by mouth daily., Disp: , Rfl:  .  potassium chloride SA (K-DUR,KLOR-CON) 20 MEQ tablet,  Take 2 tablets (40 mEq total) by mouth 2 (two) times daily with a meal., Disp: 60 tablet, Rfl: 0 .  solifenacin (VESICARE) 5 MG tablet, Take 5 mg by mouth daily., Disp: , Rfl:  .  tolterodine (DETROL LA) 2 MG 24 hr capsule, Take 2 mg  by mouth daily., Disp: , Rfl:  .  vitamin C (ASCORBIC ACID) 500 MG tablet, Take 500 mg by mouth daily., Disp: , Rfl:  .  VITAMIN E PO, Take 500 Units by mouth daily., Disp: , Rfl:   ROS  Constitutional: Denies any fever or chills Gastrointestinal: No reported hemesis, hematochezia, vomiting, or acute GI distress Musculoskeletal: Denies any acute onset joint swelling, redness, loss of ROM, or weakness Neurological: No reported episodes of acute onset apraxia, aphasia, dysarthria, agnosia, amnesia, paralysis, loss of coordination, or loss of consciousness  Allergies  Ms. Merced is allergic to hydrocodone; oxycodone; zoloft [sertraline hcl]; ciprofloxacin; sulfa antibiotics; tylenol [acetaminophen]; and ultram [tramadol].  [place]  Drug: Ms. Koska  reports that she does not use drugs. Alcohol:  reports that she does not drink alcohol. Tobacco:  reports that she quit smoking about 22 years ago. Her smoking use included cigarettes. She has a 30.00 pack-year smoking history. She has never used smokeless tobacco. Medical:  has a past medical history of Anemia, Asthma, Cancer ([place]), Chronic back pain, Depression, GERD (gastroesophageal reflux disease), Headache(784.0), Hypertension, Hypothyroidism, Peripheral vascular disease ([place]), Sepsis ([place]) (01/27/2014), Shortness of breath, and Tendinitis of both rotator cuffs. Surgical: Ms. Knoll  has a past surgical history that includes Tubal ligation; Appendectomy; Tonsillectomy; Eye surgery (Bilateral); Abdominal hysterectomy; Bladder repair; and Anterior cervical decomp/discectomy fusion (N/A, 05/01/2013). Family: family history includes Thalassemia in her paternal aunt and son.  Constitutional Exam  General appearance: Well  nourished, well developed, and well hydrated. In no apparent acute distress There were no vitals filed for this visit. BMI Assessment: Estimated body mass index is 32.37 kg/m as calculated from the following:   Height as of 02/27/18: '5\' 2"'$  (1.575 m).   Weight as of 02/27/18: 177 lb (80.3 kg).  BMI interpretation table: BMI level Category Range association with higher incidence of chronic pain  <18 kg/m2 Underweight   18.5-24.9 kg/m2 Ideal body weight   25-29.9 kg/m2 Overweight Increased incidence by 20%  30-34.9 kg/m2 Obese (Class I) Increased incidence by 68%  35-39.9 kg/m2 Severe obesity (Class II) Increased incidence by 136%  >40 kg/m2 Extreme obesity (Class III) Increased incidence by 254%   Patient's current BMI Ideal Body weight  There is no height or weight on file to calculate BMI. Patient weight not recorded   BMI Readings from Last 4 Encounters:  02/27/18 32.37 kg/m  01/01/18 30.38 kg/m  11/16/17 32.37 kg/m  01/31/14 34.44 kg/m   Wt Readings from Last 4 Encounters:  02/27/18 177 lb (80.3 kg)  01/01/18 177 lb (80.3 kg)  11/16/17 177 lb (80.3 kg)  01/31/14 188 lb 4.4 oz (85.4 kg)  Psych/Mental status: Alert, oriented x 3 (person, place, & time)       Eyes: PERLA Respiratory: No evidence of acute respiratory distress  Cervical Spine Area Exam  Skin & Axial Inspection: No masses, redness, edema, swelling, or associated skin lesions Alignment: Symmetrical Functional ROM: Unrestricted ROM      Stability: No instability detected Muscle Tone/Strength: Functionally intact. No obvious neuro-muscular anomalies detected. Sensory (Neurological): Unimpaired Palpation: No palpable anomalies              Upper Extremity (UE) Exam    Side: Right upper extremity  Side: Left upper extremity  Skin & Extremity Inspection: Skin color, temperature, and hair growth are WNL. No peripheral edema or cyanosis. No masses, redness, swelling, asymmetry, or associated skin lesions. No  contractures.  Skin & Extremity Inspection: Skin color, temperature, and hair  growth are WNL. No peripheral edema or cyanosis. No masses, redness, swelling, asymmetry, or associated skin lesions. No contractures.  Functional ROM: Unrestricted ROM          Functional ROM: Unrestricted ROM          Muscle Tone/Strength: Functionally intact. No obvious neuro-muscular anomalies detected.  Muscle Tone/Strength: Functionally intact. No obvious neuro-muscular anomalies detected.  Sensory (Neurological): Unimpaired          Sensory (Neurological): Unimpaired          Palpation: No palpable anomalies              Palpation: No palpable anomalies              Provocative Test(s):  Phalen's test: deferred Tinel's test: deferred Apley's scratch test (touch opposite shoulder):  Action 1 (Across chest): deferred Action 2 (Overhead): deferred Action 3 (LB reach): deferred   Provocative Test(s):  Phalen's test: deferred Tinel's test: deferred Apley's scratch test (touch opposite shoulder):  Action 1 (Across chest): deferred Action 2 (Overhead): deferred Action 3 (LB reach): deferred    Thoracic Spine Area Exam  Skin & Axial Inspection: No masses, redness, or swelling Alignment: Symmetrical Functional ROM: Unrestricted ROM Stability: No instability detected Muscle Tone/Strength: Functionally intact. No obvious neuro-muscular anomalies detected. Sensory (Neurological): Unimpaired Muscle strength & Tone: No palpable anomalies  Lumbar Spine Area Exam  Skin & Axial Inspection: No masses, redness, or swelling Alignment: Symmetrical Functional ROM: Unrestricted ROM       Stability: No instability detected Muscle Tone/Strength: Functionally intact. No obvious neuro-muscular anomalies detected. Sensory (Neurological): Unimpaired Palpation: No palpable anomalies       Provocative Tests: Lumbar Hyperextension/rotation test: deferred today       Lumbar quadrant test (Kemp's test): deferred today        Lumbar Lateral bending test: deferred today       Patrick's Maneuver: deferred today                   FABER test: deferred today       Thigh-thrust test: deferred today       S-I compression test: deferred today       S-I distraction test: deferred today        Gait & Posture Assessment  Ambulation: Unassisted Gait: Relatively normal for age and body habitus Posture: WNL   Lower Extremity Exam    Side: Right lower extremity  Side: Left lower extremity  Stability: No instability observed          Stability: No instability observed          Skin & Extremity Inspection: Skin color, temperature, and hair growth are WNL. No peripheral edema or cyanosis. No masses, redness, swelling, asymmetry, or associated skin lesions. No contractures.  Skin & Extremity Inspection: Skin color, temperature, and hair growth are WNL. No peripheral edema or cyanosis. No masses, redness, swelling, asymmetry, or associated skin lesions. No contractures.  Functional ROM: Unrestricted ROM                  Functional ROM: Unrestricted ROM                  Muscle Tone/Strength: Functionally intact. No obvious neuro-muscular anomalies detected.  Muscle Tone/Strength: Functionally intact. No obvious neuro-muscular anomalies detected.  Sensory (Neurological): Unimpaired  Sensory (Neurological): Unimpaired  Palpation: No palpable anomalies  Palpation: No palpable anomalies   Assessment  Primary Diagnosis & Pertinent Problem List: The primary encounter  diagnosis was Chronic lower extremity pain (Primary Area of Pain) (Bilateral) (R>L). Diagnoses of Chronic low back pain (Bilateral), Cervicalgia, and Chronic upper back pain were also pertinent to this visit.  Status Diagnosis  Improved Improved Unimproved 1. Chronic lower extremity pain (Primary Area of Pain) (Bilateral) (R>L)   2. Chronic low back pain (Bilateral)   3. Cervicalgia   4. Chronic upper back pain     Problems updated and reviewed during this  visit: No problems updated. Plan of Care  Pharmacotherapy (Medications Ordered): No orders of the defined types were placed in this encounter.  Medications administered today: Danasha Melman. Severs had no medications administered during this visit.  Procedure Orders    No procedure(s) ordered today   Lab Orders  No laboratory test(s) ordered today   Imaging Orders  No imaging studies ordered today   Referral Orders  No referral(s) requested today    Interventional management options: Planned, scheduled, and/or pending:   Diagnostic right-sided L3-4 interlaminar LESI #3    Considering:   Diagnostic right-sided L3-4 interlaminar LESI #3    Palliative PRN treatment(s):   Palliative right-sided L3-4 interlaminar LESI    Provider-requested follow-up: No follow-ups on file.  Future Appointments  Date Time Provider [place]  03/26/2018  1:15 PM Milinda Pointer, MD [facility] None   Primary Care Physician: Marinda Elk, MD Location: [facility] Note by: Gaspar Cola, MD Date: 03/26/2018; Time: 6:55 AM

## 2018-04-03 NOTE — Progress Notes (Signed)
Patient's Name: Laura Frey  MRN: [id number]  Referring Provider: Marinda Elk, MD  DOB: Jul 17, 1932  PCP: Marinda Elk, MD  DOS: 04/04/2018  Note by: Gaspar Cola, MD  Service setting: Ambulatory outpatient  Specialty: Interventional Pain Management  Location: [facility] (AMB) Pain Management Facility    Patient type: Established   Primary Reason(s) for Visit: Encounter for post-procedure evaluation of chronic illness with mild to moderate exacerbation CC: Back Pain (low) and Shoulder Pain (left)  HPI  Laura Frey is a 84 y.o. year old, female patient, who comes today for a post-procedure evaluation. She has Hypotension, unspecified; UTI (urinary tract infection); Unspecified hypothyroidism; GERD (gastroesophageal reflux disease); Nonspecific (abnormal) findings on radiological and other examination of gastrointestinal tract; Shortness of breath; HTN (hypertension); Anemia; SOB (shortness of breath); Diastolic CHF, acute (HCC); COPD (chronic obstructive pulmonary disease) ([place]); Hypoxia; Increased frequency of urination; Other chronic cystitis without hematuria; Recurrent UTI; Thalassemia; Urge incontinence; Anemia, unspecified; Acute diastolic heart failure ([place]); Esophageal reflux; Hypertension; Hypothyroid; Urinary tract infection; Abnormal MRI, lumbar spine (2018); L2 compression fracture; H/O cervical spine surgery (C4-C7 ACDF); Abnormal MRI, cervical spine (2014); Chronic low back pain (Bilateral) (1) (R>L); Lumbar facet joint osteoarthritis (Bilateral); Lumbar facet hypertrophy (Bilateral); Lumbar facet syndrome (Bilateral); Osteoarthritis of lumbar spine; Lumbar spondylosis; Cervical spondylosis; Cervical facet arthropathy (Bilateral); Chronic pain syndrome; Osteoarthritis; Grade 1 anterolisthesis of L3 over L4; Grade 1 retrolisthesis of L5 over S1; Lumbar lateral recess stenosis (Bilateral); Chronic lower extremity pain (2) (Bilateral) (R>L); Chronic lower extremity radicular  pain (Bilateral) (R>L); Chronic lumbar radiculitis (Right); DDD (degenerative disc disease), lumbar; DDD (degenerative disc disease), cervical; Frequent falls; Gait instability; Disorder of skeletal system; Pharmacologic therapy; Problems influencing health status; Cervicalgia; Chronic upper back pain; Lower extremity weakness (Bilateral); Lower extremity weakness; and Lower extremity numbness on their problem list. Her primarily concern today is the Back Pain (low) and Shoulder Pain (left)  Pain Assessment: Location: Lower, Right, Left Back Radiating: "my leg hurts"  Cant determine if it is radiating Onset: More than a month ago Duration: Chronic pain Quality: Sharp Severity: 0-No pain/10 (subjective, self-reported pain score)  Note: Reported level is compatible with observation.                               Effect on ADL: difficulty walking, has trouble doing anything Timing: Intermittent Modifying factors: sitting, lying down BP: 131/65  HR: 77  Laura Frey comes in today for post-procedure evaluation after the treatment done on 03/26/2018. The patient indicates that while she is sitting down like she has right now, she has no lower extremity or low back pain. Whenever she stands up, she begins to experience the pain in the lower back and once in a while begins to go down her right lower extremity. She does have a history of an L2 vertebral body compression fracture in [place] is possible that her right-sided low back pain may be secondary to this fracture although she indicates that she has been having problems with her lower back since well before she had the fracture. This was suggested this low back pain is secondary to her lumbar facet syndrome and possibly sacroiliac joint pain. At this point, I will first treat her L2 fracture and see if she gets some benefit from that and if she does not, then we'll go back to treating her lumbar facets and SI joint and possibly doing a radiofrequency. A  significant problem  that were having with this patient is her inability to give [place] clear answers when asked about her pain and the fact that she did not complete her pain diary and also did not bring her back. Because of this, today I have called in to the room the patient's son and I have explained to him that we need this information and that she will be provided with a pain diary and needs to be completed and returned in order for [place] to evaluate the results of the procedure and to be able to come to a conclusion when he comes to watch: With her and we need to do next. He understood and accepted.  Further details on both, my assessment(s), as well as the proposed treatment plan, please see below.  Post-Procedure Assessment  03/26/2018 Procedure: Diagnostic right-sided L3-4 transforaminal LESI #1  (although this is her second LESI, it is the first transforaminal) under fluoroscopic guidance, no sedation Pre-procedure pain score:  7/10 Post-procedure pain score: 0/10 (100% relief) Influential Factors: BMI: 32.37 kg/m Intra-procedural challenges: None observed.         Assessment challenges: Unreliable results due to inadequate record keeping by Laura Frey. Before discharge, the patient had previously reported 100% relief of the pain. Reported side-effects: None.        Post-procedural adverse reactions or complications: None reported         Sedation: No sedation used. When no sedatives are used, the analgesic levels obtained are directly associated to the effectiveness of the local anesthetics. However, when sedation is provided, the level of analgesia obtained during the initial 1 hour following the intervention, is believed to be the result of a combination of factors. These factors may include, but are not limited to: 1. The effectiveness of the local anesthetics used. 2. The effects of the analgesic(s) and/or anxiolytic(s) used. 3. The degree of discomfort experienced by the patient at the time  of the procedure. 4. The patients ability and reliability in recalling and recording the events. 5. The presence and influence of possible secondary gains and/or psychosocial factors. Reported result: Relief experienced during the 1st hour after the procedure: ("I cant remember") (Ultra-Short Term Relief)            Interpretative annotation: Clinically appropriate result. No IV Analgesic or Anxiolytic given, therefore benefits are completely due to Local Anesthetic effects.          Effects of local anesthetic: The analgesic effects attained during this period are directly associated to the localized infiltration of local anesthetics and therefore cary significant diagnostic value as to the etiological location, or anatomical origin, of the pain. Expected duration of relief is directly dependent on the pharmacodynamics of the local anesthetic used. Long-acting (4-6 hours) anesthetics used.  Reported result: Relief during the next 4 to 6 hour after the procedure: ("I cant remember") (Short-Term Relief)            Interpretative annotation: Clinically appropriate result. Analgesia during this period is likely to be Local Anesthetic-related.          Long-term benefit: Defined as the period of time past the expected duration of local anesthetics (1 hour for short-acting and 4-6 hours for long-acting). With the possible exception of prolonged sympathetic blockade from the local anesthetics, benefits during this period are typically attributed to, or associated with, other factors such as analgesic sensory neuropraxia, antiinflammatory effects, or beneficial biochemical changes provided by agents other than the local anesthetics.  Reported result: Extended relief following  procedure: 0 % (Long-Term Relief)            Interpretative annotation: Unreliable result. Poor/inaccurate patient record keeping and/or recollection. No permanent benefit expected. Pain appears to be refractory to this treatment modality.           Current benefits: Defined as reported results that persistent at this point in time.   Analgesia: 0 %            Function: Back to baseline ROM: Back to baseline Interpretative annotation: Recurrence of symptoms.    Results would suggest persistent aggravating factors.          Interpretation: Results would suggest a successful diagnostic intervention.                  Plan:  Re-assessment of algesic etiology.                Laboratory Chemistry  Inflammation Markers (CRP: Acute Phase) (ESR: Chronic Phase) Lab Results  Component Value Date   CRP 1.0 01/01/2018   ESRSEDRATE 12 01/01/2018   LATICACIDVEN 1.3 01/29/2014                         Renal Markers Lab Results  Component Value Date   BUN 19 01/01/2018   CREATININE 0.98 01/01/2018   BCR 19 01/01/2018   GFRAA 62 01/01/2018   GFRNONAA 54 (L) 01/01/2018                             Hepatic Markers Lab Results  Component Value Date   AST 23 01/01/2018   ALT 13 02/05/2014   ALBUMIN 4.5 01/01/2018                        Neuropathy Markers Lab Results  Component Value Date   HGBA1C 6.0 (H) 01/27/2014                        Hematology Parameters Lab Results  Component Value Date   INR 1.31 01/30/2014   LABPROT 16.0 (H) 01/30/2014   APTT 39 (H) 01/29/2014   PLT 382 02/08/2014   HGB 9.1 (L) 02/08/2014   HCT 27.8 (L) 02/08/2014                        CV Markers Lab Results  Component Value Date   CKTOTAL 206 (H) 02/04/2014   CKMB 10.4 (H) 02/04/2014   TROPONINI <0.30 02/05/2014                         Note: Lab results reviewed.  Recent Diagnostic Imaging Results  DG C-Arm 1-60 Min-No Report Fluoroscopy was utilized by the requesting physician.  No radiographic  interpretation.   Complexity Note: I personally reviewed the fluoroscopic imaging of the procedure.                        Meds   Current Outpatient Medications:  .  albuterol (PROVENTIL HFA;VENTOLIN HFA) 108 (90 BASE) MCG/ACT  inhaler, Inhale 2 puffs into the lungs every 6 (six) hours as needed for wheezing., Disp: , Rfl:  .  amLODipine (NORVASC) 5 MG tablet, Take 2 tablets (10 mg total) by mouth daily., Disp: 60 tablet, Rfl: 0 .  Cholecalciferol (VITAMIN D) 2000 UNITS tablet, Take  2,000 Units by mouth daily., Disp: , Rfl:  .  DULOXETINE HCL PO, Take by mouth daily., Disp: , Rfl:  .  estrogens, conjugated, (PREMARIN) 0.45 MG tablet, Take 0.45 mg by mouth daily. , Disp: , Rfl:  .  fexofenadine (ALLEGRA) 180 MG tablet, Take 180 mg by mouth daily., Disp: , Rfl:  .  furosemide (LASIX) 20 MG tablet, Take 1 tablet (20 mg total) by mouth daily., Disp: 30 tablet, Rfl: 0 .  guaifenesin (ROBITUSSIN) 100 MG/5ML syrup, Take 200 mg by mouth 3 (three) times daily as needed for cough., Disp: , Rfl:  .  levothyroxine (SYNTHROID, LEVOTHROID) 75 MCG tablet, Take 75 mcg by mouth daily before breakfast., Disp: , Rfl:  .  loperamide (IMODIUM) 2 MG capsule, Take 1 capsule (2 mg total) by mouth 2 (two) times daily as needed for diarrhea or loose stools., Disp: 20 capsule, Rfl: 0 .  magnesium oxide (MAG-OX) 400 MG tablet, Take 400 mg by mouth daily., Disp: , Rfl:  .  metoprolol (TOPROL-XL) 200 MG 24 hr tablet, Take 200 mg by mouth daily., Disp: , Rfl:  .  Multiple Vitamin (MULTIVITAMIN WITH MINERALS) TABS, Take 1 tablet by mouth daily., Disp: , Rfl:  .  omeprazole (PRILOSEC) 20 MG capsule, Take 20 mg by mouth daily., Disp: , Rfl:  .  potassium chloride SA (K-DUR,KLOR-CON) 20 MEQ tablet, Take 2 tablets (40 mEq total) by mouth 2 (two) times daily with a meal., Disp: 60 tablet, Rfl: 0 .  solifenacin (VESICARE) 5 MG tablet, Take 5 mg by mouth daily., Disp: , Rfl:  .  tolterodine (DETROL LA) 2 MG 24 hr capsule, Take 2 mg by mouth daily., Disp: , Rfl:  .  tolterodine (DETROL LA) 4 MG 24 hr capsule, , Disp: , Rfl: 0 .  vitamin C (ASCORBIC ACID) 500 MG tablet, Take 500 mg by mouth daily., Disp: , Rfl:  .  VITAMIN E PO, Take 500 Units by mouth daily.,  Disp: , Rfl:   ROS  Constitutional: Denies any fever or chills Gastrointestinal: No reported hemesis, hematochezia, vomiting, or acute GI distress Musculoskeletal: Denies any acute onset joint swelling, redness, loss of ROM, or weakness Neurological: No reported episodes of acute onset apraxia, aphasia, dysarthria, agnosia, amnesia, paralysis, loss of coordination, or loss of consciousness  Allergies  Laura Frey is allergic to ciprofloxacin; hydrocodone; oxycodone; zoloft [sertraline hcl]; sertraline; sulfa antibiotics; tylenol [acetaminophen]; and ultram [tramadol].  [place]  Drug: Laura Frey  reports that she does not use drugs. Alcohol:  reports that she does not drink alcohol. Tobacco:  reports that she quit smoking about 22 years ago. Her smoking use included cigarettes. She has a 30.00 pack-year smoking history. She has never used smokeless tobacco. Medical:  has a past medical history of Anemia, Asthma, Cancer ([place]), Chronic back pain, Depression, GERD (gastroesophageal reflux disease), Headache(784.0), Hypertension, Hypothyroidism, Peripheral vascular disease ([place]), Sepsis ([place]) (01/27/2014), Shortness of breath, and Tendinitis of both rotator cuffs. Surgical: Laura Frey  has a past surgical history that includes Tubal ligation; Appendectomy; Tonsillectomy; Eye surgery (Bilateral); Abdominal hysterectomy; Bladder repair; and Anterior cervical decomp/discectomy fusion (N/A, 05/01/2013). Family: family history includes Thalassemia in her paternal aunt and son.  Constitutional Exam  General appearance: Well nourished, well developed, and well hydrated. In no apparent acute distress Vitals:   04/04/18 1050  BP: 131/65  Pulse: 77  Resp: 18  Temp: 98.4 F (36.9 C)  SpO2: 96%  Weight: 177 lb (80.3 kg)  Height: _0  (1.575 m)  BMI Assessment: Estimated body mass index is 32.37 kg/m as calculated from the following:   Height as of this encounter: _0  (1.575 m).   Weight as of this  encounter: 177 lb (80.3 kg).  BMI interpretation table: BMI level Category Range association with higher incidence of chronic pain  <18 kg/m2 Underweight   18.5-24.9 kg/m2 Ideal body weight   25-29.9 kg/m2 Overweight Increased incidence by 20%  30-34.9 kg/m2 Obese (Class I) Increased incidence by 68%  35-39.9 kg/m2 Severe obesity (Class II) Increased incidence by 136%  >40 kg/m2 Extreme obesity (Class III) Increased incidence by 254%   Patient's current BMI Ideal Body weight  Body mass index is 32.37 kg/m. Ideal body weight: 50.1 kg (110 lb 7.2 oz) Adjusted ideal body weight: 62.2 kg (137 lb 1.1 oz)   BMI Readings from Last 4 Encounters:  04/04/18 32.37 kg/m  02/27/18 32.37 kg/m  01/01/18 30.38 kg/m  11/16/17 32.37 kg/m   Wt Readings from Last 4 Encounters:  04/04/18 177 lb (80.3 kg)  02/27/18 177 lb (80.3 kg)  01/01/18 177 lb (80.3 kg)  11/16/17 177 lb (80.3 kg)  Psych/Mental status: Alert, oriented x 3 (person, place, & time)       Eyes: PERLA Respiratory: No evidence of acute respiratory distress  Cervical Spine Area Exam  Skin & Axial Inspection: No masses, redness, edema, swelling, or associated skin lesions Alignment: Symmetrical Functional ROM: Unrestricted ROM      Stability: No instability detected Muscle Tone/Strength: Functionally intact. No obvious neuro-muscular anomalies detected. Sensory (Neurological): Unimpaired Palpation: No palpable anomalies              Upper Extremity (UE) Exam    Side: Right upper extremity  Side: Left upper extremity  Skin & Extremity Inspection: Skin color, temperature, and hair growth are WNL. No peripheral edema or cyanosis. No masses, redness, swelling, asymmetry, or associated skin lesions. No contractures.  Skin & Extremity Inspection: Skin color, temperature, and hair growth are WNL. No peripheral edema or cyanosis. No masses, redness, swelling, asymmetry, or associated skin lesions. No contractures.  Functional ROM:  Unrestricted ROM          Functional ROM: Unrestricted ROM          Muscle Tone/Strength: Functionally intact. No obvious neuro-muscular anomalies detected.  Muscle Tone/Strength: Functionally intact. No obvious neuro-muscular anomalies detected.  Sensory (Neurological): Unimpaired          Sensory (Neurological): Unimpaired          Palpation: No palpable anomalies              Palpation: No palpable anomalies              Provocative Test(s):  Phalen's test: deferred Tinel's test: deferred Apley's scratch test (touch opposite shoulder):  Action 1 (Across chest): deferred Action 2 (Overhead): deferred Action 3 (LB reach): deferred   Provocative Test(s):  Phalen's test: deferred Tinel's test: deferred Apley's scratch test (touch opposite shoulder):  Action 1 (Across chest): deferred Action 2 (Overhead): deferred Action 3 (LB reach): deferred    Thoracic Spine Area Exam  Skin & Axial Inspection: No masses, redness, or swelling Alignment: Symmetrical Functional ROM: Unrestricted ROM Stability: No instability detected Muscle Tone/Strength: Functionally intact. No obvious neuro-muscular anomalies detected. Sensory (Neurological): Unimpaired Muscle strength & Tone: No palpable anomalies  Lumbar Spine Area Exam  Skin & Axial Inspection: No masses, redness, or swelling Alignment: Symmetrical Functional ROM: Decreased ROM       Stability: No  instability detected Muscle Tone/Strength: Functionally intact. No obvious neuro-muscular anomalies detected. Sensory (Neurological): Movement-associated pain Palpation: Complains of area being tender to palpation       Provocative Tests: Lumbar Hyperextension/rotation test: (+) bilaterally for facet joint pain. Lumbar quadrant test (Kemp's test): (+) bilaterally for facet joint pain. Lumbar Lateral bending test: deferred today       Patrick's Maneuver: deferred today                   FABER test: deferred today       Thigh-thrust test:  deferred today       S-I compression test: deferred today       S-I distraction test: deferred today        Gait & Posture Assessment  Ambulation: Patient came in today in a wheel chair Gait: Significantly limited. Dependent on assistive device to ambulate Posture: Difficulty standing up straight, due to pain   Lower Extremity Exam    Side: Right lower extremity  Side: Left lower extremity  Stability: No instability observed          Stability: No instability observed          Skin & Extremity Inspection: Skin color, temperature, and hair growth are WNL. No peripheral edema or cyanosis. No masses, redness, swelling, asymmetry, or associated skin lesions. No contractures.  Skin & Extremity Inspection: Skin color, temperature, and hair growth are WNL. No peripheral edema or cyanosis. No masses, redness, swelling, asymmetry, or associated skin lesions. No contractures.  Functional ROM: Decreased ROM for all joints of the lower extremity          Functional ROM: Decreased ROM for all joints of the lower extremity          Muscle Tone/Strength: Deconditioned  Muscle Tone/Strength: Deconditioned  Sensory (Neurological): Referred pain pattern  Sensory (Neurological): Referred pain pattern  Palpation: No palpable anomalies  Palpation: No palpable anomalies   Assessment  Primary Diagnosis & Pertinent Problem List: The primary encounter diagnosis was Chronic lower extremity pain (Primary Area of Pain) (Bilateral) (R>L). Diagnoses of Chronic lumbar radiculitis (Right), Chronic lower extremity radicular pain (Bilateral) (R>L), Grade 1 anterolisthesis of L3 over L4, Grade 1 retrolisthesis of L5 over S1, L2 compression fracture, Lumbar facet syndrome (Bilateral), Cervicalgia, Weakness of both lower extremities, Lower extremity numbness, and Chronic low back pain (Bilateral) (1) (R>L) were also pertinent to this visit.  Status Diagnosis  Persistent Resolved Resolved 1. Chronic lower extremity pain  (Primary Area of Pain) (Bilateral) (R>L)   2. Chronic lumbar radiculitis (Right)   3. Chronic lower extremity radicular pain (Bilateral) (R>L)   4. Grade 1 anterolisthesis of L3 over L4   5. Grade 1 retrolisthesis of L5 over S1   6. L2 compression fracture   7. Lumbar facet syndrome (Bilateral)   8. Cervicalgia   9. Weakness of both lower extremities   10. Lower extremity numbness   11. Chronic low back pain (Bilateral) (1) (R>L)     Problems updated and reviewed during this visit: Problem  Lower Extremity Weakness  Lower Extremity Numbness  Chronic low back pain (Bilateral) (1) (R>L)   RESULT:   Technique: Multiplanar and multisequence imaging of the lumbar spine was  obtained without the administration of gadolinium.   Findings: The conus medullaris terminates at an L1 level. The cauda equina  demonstrate no evidence of clumping or thickening.   At the T12-L1, L1-L2, L2-L3 levels, there is no evidence of thecal sac  stenosis nor  neural foraminal narrowing.   At the L3-L4 level a broad-based disc bulge is appreciated causing partial  effacement of the anterior CSF space. Posterior portions of the CSF space  are reduced secondary to ligamentum flavum and facet hypertrophy. There is  no evidence of neural foraminal narrowing. Mild thecal sac narrowing is  appreciated at this level.   At the L4-5 level a broad-based disc bulge is appreciated contributing to  multifactorial mild to moderate thecal sac stenosis also secondary to  ligamentum flavum and facet hypertrophy. There is no evidence of neural  foraminal narrowing.   At the L5-S1 level a broad-based disc bulge is appreciated demonstrating  lateralization to the left. The lateralized component of the disc  demonstrates caudal migration and mass effect upon the central aspect of  the  S1 nerve root on the left. There is also lateralization of the disc bulge  to  the left. The disc bulge causes mild effacement of  the anterior CSF space.  The transverse portions of the CSF space are reduced secondary to  ligamentum  flavum and facet hypertrophy. There is a component of lateralization of  the  disc bulge to the right which contributes to mass effect upon the central  aspect of the S1 nerve root on the right. Exiting nerve root compromise  and  compression is also of diagnostic concern.   There is moderate to severe neural foraminal narrowing on the left,  multifactorial secondary to lateralization of the disc bulge, endplate  hypertrophic spurring, as well as facet hypertrophy. Exiting nerve root  compromise as well as a component of compression is a diagnostic concern.  Moderate to severe neural foraminal narrowing is identified on the right  again multifactorial with findings concerning for exiting nerve root  compromise and possible compression.   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 There are also findings  concerning  for moderate to severe neural foraminal narrowing on the left with  compromise and possible compression of the exiting L5 nerve root. Similar  findings are identified on the right though to a lesser extent.  2. Multifactorial spondylolysis at L4-L5 with mild thecal sac narrowing.  3. Surgical consultation is recommended if clinically warranted.    Chronic lower extremity pain (2) (Bilateral) (R>L)   Plan of Care  Pharmacotherapy (Medications Ordered): No orders of the defined types were placed in this encounter.  Medications administered today: Laura Boman. [place] had no medications administered during this visit.   Procedure Orders     Lumbar Epidural Injection Lab Orders  No laboratory test(s) ordered today   Imaging  Orders  No imaging studies ordered today    Referral Orders     Ambulatory referral to Physical Therapy Interventional management options: Planned, scheduled, and/or pending:   Diagnostic right-sided L2-3 interlaminar LESI #3    Considering:   Diagnostic right-sided L3-4 interlaminar LESI #3  Diagnostic bilateral L3-4, L4-5 and/or L5-S1 transforaminal ESI  Diagnostic bilateral L2-3 transforaminal ESI  Diagnostic bilateral lumbar facet block  Possible bilateral lumbar facet RFA  Diagnostic cervical facet block  Possible bilateral cervical facet RFA  Diagnostic midline  Cervical ESI    Palliative PRN treatment(s):   Diagnostic right-sided L3-4 interlaminar LESI    Provider-requested follow-up:  Return for Procedure (w/ sedation): (R) L2-3 LESI.  Future Appointments  Date Time Provider [place]  04/12/2018  1:00 PM Milinda Pointer, MD [facility] None   Primary Care Physician: Marinda Elk, MD Location: [facility] [facility] Note by: Gaspar Cola, MD Date: 04/04/2018; Time: 12:42 PM

## 2018-04-04 ENCOUNTER — Ambulatory Visit: Payer: Medicare Other | Attending: Pain Medicine | Admitting: Pain Medicine

## 2018-04-04 ENCOUNTER — Other Ambulatory Visit: Payer: Self-pay

## 2018-04-04 ENCOUNTER — Encounter: Payer: Self-pay | Admitting: Pain Medicine

## 2018-04-04 VITALS — BP 131/65 | HR 77 | Temp 98.4°F | Resp 18 | Ht 62.0 in | Wt 177.0 lb

## 2018-04-04 DIAGNOSIS — Z9889 Other specified postprocedural states: Secondary | ICD-10-CM | POA: Diagnosis not present

## 2018-04-04 DIAGNOSIS — M545 Low back pain: Secondary | ICD-10-CM | POA: Diagnosis present

## 2018-04-04 DIAGNOSIS — K219 Gastro-esophageal reflux disease without esophagitis: Secondary | ICD-10-CM | POA: Diagnosis not present

## 2018-04-04 DIAGNOSIS — R29898 Other symptoms and signs involving the musculoskeletal system: Secondary | ICD-10-CM | POA: Diagnosis not present

## 2018-04-04 DIAGNOSIS — Z881 Allergy status to other antibiotic agents status: Secondary | ICD-10-CM | POA: Insufficient documentation

## 2018-04-04 DIAGNOSIS — Z882 Allergy status to sulfonamides status: Secondary | ICD-10-CM | POA: Diagnosis not present

## 2018-04-04 DIAGNOSIS — G894 Chronic pain syndrome: Secondary | ICD-10-CM | POA: Diagnosis not present

## 2018-04-04 DIAGNOSIS — I5031 Acute diastolic (congestive) heart failure: Secondary | ICD-10-CM | POA: Diagnosis not present

## 2018-04-04 DIAGNOSIS — E039 Hypothyroidism, unspecified: Secondary | ICD-10-CM | POA: Diagnosis not present

## 2018-04-04 DIAGNOSIS — R531 Weakness: Secondary | ICD-10-CM | POA: Insufficient documentation

## 2018-04-04 DIAGNOSIS — Z885 Allergy status to narcotic agent status: Secondary | ICD-10-CM | POA: Diagnosis not present

## 2018-04-04 DIAGNOSIS — Z888 Allergy status to other drugs, medicaments and biological substances status: Secondary | ICD-10-CM | POA: Insufficient documentation

## 2018-04-04 DIAGNOSIS — M4310 Spondylolisthesis, site unspecified: Secondary | ICD-10-CM

## 2018-04-04 DIAGNOSIS — I110 Hypertensive heart disease with heart failure: Secondary | ICD-10-CM | POA: Insufficient documentation

## 2018-04-04 DIAGNOSIS — R200 Anesthesia of skin: Secondary | ICD-10-CM | POA: Insufficient documentation

## 2018-04-04 DIAGNOSIS — M5116 Intervertebral disc disorders with radiculopathy, lumbar region: Secondary | ICD-10-CM | POA: Insufficient documentation

## 2018-04-04 DIAGNOSIS — M5410 Radiculopathy, site unspecified: Secondary | ICD-10-CM | POA: Diagnosis not present

## 2018-04-04 DIAGNOSIS — I739 Peripheral vascular disease, unspecified: Secondary | ICD-10-CM | POA: Diagnosis not present

## 2018-04-04 DIAGNOSIS — Z90710 Acquired absence of both cervix and uterus: Secondary | ICD-10-CM | POA: Insufficient documentation

## 2018-04-04 DIAGNOSIS — Z886 Allergy status to analgesic agent status: Secondary | ICD-10-CM | POA: Diagnosis not present

## 2018-04-04 DIAGNOSIS — M4856XS Collapsed vertebra, not elsewhere classified, lumbar region, sequela of fracture: Secondary | ICD-10-CM

## 2018-04-04 DIAGNOSIS — Z9851 Tubal ligation status: Secondary | ICD-10-CM | POA: Diagnosis not present

## 2018-04-04 DIAGNOSIS — M25512 Pain in left shoulder: Secondary | ICD-10-CM | POA: Diagnosis present

## 2018-04-04 DIAGNOSIS — M542 Cervicalgia: Secondary | ICD-10-CM | POA: Diagnosis not present

## 2018-04-04 DIAGNOSIS — M4316 Spondylolisthesis, lumbar region: Secondary | ICD-10-CM | POA: Insufficient documentation

## 2018-04-04 DIAGNOSIS — M4856XA Collapsed vertebra, not elsewhere classified, lumbar region, initial encounter for fracture: Secondary | ICD-10-CM | POA: Insufficient documentation

## 2018-04-04 DIAGNOSIS — J449 Chronic obstructive pulmonary disease, unspecified: Secondary | ICD-10-CM | POA: Insufficient documentation

## 2018-04-04 DIAGNOSIS — M5442 Lumbago with sciatica, left side: Secondary | ICD-10-CM

## 2018-04-04 DIAGNOSIS — Z79890 Hormone replacement therapy (postmenopausal): Secondary | ICD-10-CM | POA: Insufficient documentation

## 2018-04-04 DIAGNOSIS — D649 Anemia, unspecified: Secondary | ICD-10-CM | POA: Diagnosis not present

## 2018-04-04 DIAGNOSIS — M5416 Radiculopathy, lumbar region: Secondary | ICD-10-CM

## 2018-04-04 DIAGNOSIS — Z79899 Other long term (current) drug therapy: Secondary | ICD-10-CM | POA: Insufficient documentation

## 2018-04-04 DIAGNOSIS — M79605 Pain in left leg: Secondary | ICD-10-CM | POA: Diagnosis not present

## 2018-04-04 DIAGNOSIS — G8929 Other chronic pain: Secondary | ICD-10-CM | POA: Diagnosis not present

## 2018-04-04 DIAGNOSIS — M47816 Spondylosis without myelopathy or radiculopathy, lumbar region: Secondary | ICD-10-CM

## 2018-04-04 DIAGNOSIS — Z87891 Personal history of nicotine dependence: Secondary | ICD-10-CM | POA: Diagnosis not present

## 2018-04-04 DIAGNOSIS — M79604 Pain in right leg: Secondary | ICD-10-CM

## 2018-04-04 DIAGNOSIS — F329 Major depressive disorder, single episode, unspecified: Secondary | ICD-10-CM | POA: Diagnosis not present

## 2018-04-04 DIAGNOSIS — M5441 Lumbago with sciatica, right side: Secondary | ICD-10-CM

## 2018-04-04 NOTE — Patient Instructions (Signed)
____________________________________________________________________________________________  Preparing for Procedure with Sedation  Instructions: . Oral Intake: Do not eat or drink anything for at least 8 hours prior to your procedure. . Transportation: Public transportation is not allowed. Bring an adult driver. The driver must be physically present in our waiting room before any procedure can be started. . Physical Assistance: Bring an adult physically capable of assisting you, in the event you need help. This adult should keep you company at home for at least 6 hours after the procedure. . Blood Pressure Medicine: Take your blood pressure medicine with a sip of water the morning of the procedure. . Blood thinners:  . Diabetics on insulin: Notify the staff so that you can be scheduled 1st case in the morning. If your diabetes requires high dose insulin, take only  of your normal insulin dose the morning of the procedure and notify the staff that you have done so. . Preventing infections: Shower with an antibacterial soap the morning of your procedure. . Build-up your immune system: Take 1000 mg of Vitamin C with every meal (3 times a day) the day prior to your procedure. . Antibiotics: Inform the staff if you have a condition or reason that requires you to take antibiotics before dental procedures. . Pregnancy: If you are pregnant, call and cancel the procedure. . Sickness: If you have a cold, fever, or any active infections, call and cancel the procedure. . Arrival: You must be in the facility at least 30 minutes prior to your scheduled procedure. . Children: Do not bring children with you. . Dress appropriately: Bring dark clothing that you would not mind if they get stained. . Valuables: Do not bring any jewelry or valuables.  Procedure appointments are reserved for interventional treatments only. . No Prescription Refills. . No medication changes will be discussed during procedure  appointments. . No disability issues will be discussed.  Remember:  Regular Business hours are:  Monday to Thursday 8:00 AM to 4:00 PM  Provider's Schedule: Eiliyah Reh, MD:  Procedure days: Tuesday and Thursday 7:30 AM to 4:00 PM  Bilal Lateef, MD:  Procedure days: Monday and Wednesday 7:30 AM to 4:00 PM ____________________________________________________________________________________________    

## 2018-04-04 NOTE — Progress Notes (Signed)
Safety precautions to be maintained throughout the outpatient stay will include: orient to surroundings, keep bed in low position, maintain call bell within reach at all times, provide assistance with transfer out of bed and ambulation.  

## 2018-04-10 ENCOUNTER — Encounter: Payer: Medicare Other | Admitting: Physical Therapy

## 2018-04-12 ENCOUNTER — Other Ambulatory Visit: Payer: Self-pay

## 2018-04-12 ENCOUNTER — Encounter: Payer: Self-pay | Admitting: Pain Medicine

## 2018-04-12 ENCOUNTER — Ambulatory Visit (HOSPITAL_BASED_OUTPATIENT_CLINIC_OR_DEPARTMENT_OTHER): Payer: Medicare Other | Admitting: Pain Medicine

## 2018-04-12 ENCOUNTER — Ambulatory Visit
Admission: RE | Admit: 2018-04-12 | Discharge: 2018-04-12 | Disposition: A | Discharge status: Home / Self Care | Payer: Medicare Other | Source: Ambulatory Visit | Attending: Pain Medicine | Admitting: Pain Medicine

## 2018-04-12 VITALS — BP 133/88 | HR 79 | Temp 98.3°F | Resp 16 | Ht 64.0 in | Wt 177.0 lb

## 2018-04-12 DIAGNOSIS — M5416 Radiculopathy, lumbar region: Secondary | ICD-10-CM

## 2018-04-12 DIAGNOSIS — M5442 Lumbago with sciatica, left side: Secondary | ICD-10-CM

## 2018-04-12 DIAGNOSIS — M5410 Radiculopathy, site unspecified: Secondary | ICD-10-CM

## 2018-04-12 DIAGNOSIS — M4310 Spondylolisthesis, site unspecified: Secondary | ICD-10-CM | POA: Diagnosis not present

## 2018-04-12 DIAGNOSIS — M4856XS Collapsed vertebra, not elsewhere classified, lumbar region, sequela of fracture: Secondary | ICD-10-CM | POA: Insufficient documentation

## 2018-04-12 DIAGNOSIS — G8929 Other chronic pain: Secondary | ICD-10-CM | POA: Insufficient documentation

## 2018-04-12 DIAGNOSIS — Z79890 Hormone replacement therapy (postmenopausal): Secondary | ICD-10-CM | POA: Insufficient documentation

## 2018-04-12 DIAGNOSIS — M47816 Spondylosis without myelopathy or radiculopathy, lumbar region: Secondary | ICD-10-CM

## 2018-04-12 DIAGNOSIS — Z885 Allergy status to narcotic agent status: Secondary | ICD-10-CM | POA: Insufficient documentation

## 2018-04-12 DIAGNOSIS — Z886 Allergy status to analgesic agent status: Secondary | ICD-10-CM | POA: Diagnosis not present

## 2018-04-12 DIAGNOSIS — M5441 Lumbago with sciatica, right side: Secondary | ICD-10-CM

## 2018-04-12 DIAGNOSIS — M5136 Other intervertebral disc degeneration, lumbar region: Secondary | ICD-10-CM | POA: Insufficient documentation

## 2018-04-12 DIAGNOSIS — Z888 Allergy status to other drugs, medicaments and biological substances status: Secondary | ICD-10-CM | POA: Insufficient documentation

## 2018-04-12 DIAGNOSIS — Z79899 Other long term (current) drug therapy: Secondary | ICD-10-CM | POA: Diagnosis not present

## 2018-04-12 DIAGNOSIS — Z881 Allergy status to other antibiotic agents status: Secondary | ICD-10-CM | POA: Insufficient documentation

## 2018-04-12 DIAGNOSIS — Z882 Allergy status to sulfonamides status: Secondary | ICD-10-CM | POA: Insufficient documentation

## 2018-04-12 MED ORDER — FENTANYL CITRATE (PF) 100 MCG/2ML IJ SOLN: 25.0000 ug | INTRAMUSCULAR | Status: DC | PRN

## 2018-04-12 MED ORDER — LIDOCAINE HCL 2 % IJ SOLN
20.0000 mL | Freq: Once | INTRAMUSCULAR | Status: AC
  Administered 2018-04-12: 400 mg
  Filled 2018-04-12: qty 40

## 2018-04-12 MED ORDER — SODIUM CHLORIDE 0.9% FLUSH
2.0000 mL | Freq: Once | INTRAVENOUS | Status: AC
  Administered 2018-04-12: 2 mL

## 2018-04-12 MED ORDER — LACTATED RINGERS IV SOLN: 1000.0000 mL | Freq: Once | INTRAVENOUS | Status: DC

## 2018-04-12 MED ORDER — IOPAMIDOL (ISOVUE-M 200) INJECTION 41%
10.0000 mL | Freq: Once | INTRAMUSCULAR | Status: AC
  Administered 2018-04-12: 10 mL via EPIDURAL
  Filled 2018-04-12: qty 10

## 2018-04-12 MED ORDER — MIDAZOLAM HCL 5 MG/5ML IJ SOLN: 1.0000 mg | INTRAMUSCULAR | Status: DC | PRN

## 2018-04-12 MED ORDER — TRIAMCINOLONE ACETONIDE 40 MG/ML IJ SUSP
40.0000 mg | Freq: Once | INTRAMUSCULAR | Status: AC
  Administered 2018-04-12: 40 mg
  Filled 2018-04-12: qty 1

## 2018-04-12 MED ORDER — ROPIVACAINE HCL 2 MG/ML IJ SOLN
2.0000 mL | Freq: Once | INTRAMUSCULAR | Status: AC
  Administered 2018-04-12: 2 mL via EPIDURAL
  Filled 2018-04-12: qty 10

## 2018-04-12 MED ORDER — SODIUM CHLORIDE 0.9 % IJ SOLN
INTRAMUSCULAR | Status: AC
  Filled 2018-04-12: qty 10

## 2018-04-12 NOTE — Progress Notes (Signed)
Safety precautions to be maintained throughout the outpatient stay will include: orient to surroundings, keep bed in low position, maintain call bell within reach at all times, provide assistance with transfer out of bed and ambulation.  

## 2018-04-12 NOTE — Patient Instructions (Signed)

## 2018-04-12 NOTE — Progress Notes (Signed)
Patient's Name: Laura Frey  MRN: [id number]  Referring Provider: Marinda Elk, MD  DOB: Mar 22, 1932  PCP: Marinda Elk, MD  DOS: 04/12/2018  Note by: Gaspar Cola, MD  Service setting: Ambulatory outpatient  Specialty: Interventional Pain Management  Patient type: Established  Location: [facility] (AMB) [facility] Management Facility  Visit type: Interventional Procedure   Primary Reason for Visit: Interventional Pain Management Treatment. CC: Back Pain  Procedure:          Anesthesia, Analgesia, Anxiolysis:  Type: Therapeutic Inter-Laminar Epidural Steroid Injection #3  Region: Lumbar Level: L2-3 Level. Laterality: Right-Sided Paramedial  Type: Local Anesthesia Indication(s): Analgesia         Route: Infiltration ([place]/IM) IV Access: Declined Sedation: Declined  Local Anesthetic: Lidocaine 1-2%   Indications: 1. DDD (degenerative disc disease), lumbar   2. L2 compression fracture   3. Grade 1 anterolisthesis of L3 over L4   4. Osteoarthritis of lumbar spine   5. Chronic lumbar radiculitis (Right)   6. Chronic lower extremity radicular pain (Bilateral) (R>L)   7. Chronic low back pain (Bilateral) (1) (R>L)    Pain Score: Pre-procedure: 5 /10 Post-procedure: 0-No pain/10  Pre-op Assessment:  Laura Frey is a 84 y.o. (year old), female patient, seen today for interventional treatment. She  has a past surgical history that includes Tubal ligation; Appendectomy; Tonsillectomy; Eye surgery (Bilateral); Abdominal hysterectomy; Bladder repair; and Anterior cervical decomp/discectomy fusion (N/A, 05/01/2013). Laura Frey has a current medication list which includes the following prescription(s): albuterol, amlodipine, vitamin d, duloxetine hcl, estrogens (conjugated), fexofenadine, furosemide, guaifenesin, levothyroxine, loperamide, magnesium oxide, metoprolol, multivitamin with minerals, omeprazole, potassium chloride sa, solifenacin, tolterodine, tolterodine, vitamin c, and vitamin  e. Her primarily concern today is the Back Pain  Initial Vital Signs:  Pulse/HCG Rate: 79ECG Heart Rate: 82 Temp: 98.3 F (36.8 C) Resp: 16 BP: 135/70 SpO2: 96 %  BMI: Estimated body mass index is 30.38 kg/m as calculated from the following:   Height as of this encounter: 5\' 4"  (1.626 m).   Weight as of this encounter: 177 lb (80.3 kg).  Risk Assessment: Allergies: Reviewed. She is allergic to ciprofloxacin; hydrocodone; oxycodone; zoloft [sertraline hcl]; sertraline; sulfa antibiotics; tylenol [acetaminophen]; and ultram [tramadol].  Allergy Precautions: None required Coagulopathies: Reviewed. None identified.  Blood-thinner therapy: None at this time Active Infection(s): Reviewed. None identified. Laura Frey is afebrile  Site Confirmation: Laura Frey was asked to confirm the procedure and laterality before marking the site Procedure checklist: Completed Consent: Before the procedure and under the influence of no sedative(s), amnesic(s), or anxiolytics, the patient was informed of the treatment options, risks and possible complications. To fulfill our ethical and legal obligations, as recommended by the American Medical Association's Code of Ethics, I have informed the patient of my clinical impression; the nature and purpose of the treatment or procedure; the risks, benefits, and possible complications of the intervention; the alternatives, including doing nothing; the risk(s) and benefit(s) of the alternative treatment(s) or procedure(s); and the risk(s) and benefit(s) of doing nothing. The patient was provided information about the general risks and possible complications associated with the procedure. These may include, but are not limited to: failure to achieve desired goals, infection, bleeding, organ or nerve damage, allergic reactions, paralysis, and death. In addition, the patient was informed of those risks and complications associated to Spine-related procedures, such as failure  to decrease pain; infection (i.e.: Meningitis, epidural or intraspinal abscess); bleeding (i.e.: epidural hematoma, subarachnoid hemorrhage, or any other type of intraspinal  or peri-dural bleeding); organ or nerve damage (i.e.: Any type of peripheral nerve, nerve root, or spinal cord injury) with subsequent damage to sensory, motor, and/or autonomic systems, resulting in permanent pain, numbness, and/or weakness of one or several areas of the body; allergic reactions; (i.e.: anaphylactic reaction); and/or death. Furthermore, the patient was informed of those risks and complications associated with the medications. These include, but are not limited to: allergic reactions (i.e.: anaphylactic or anaphylactoid reaction(s)); adrenal axis suppression; blood sugar elevation that in diabetics may result in ketoacidosis or comma; water retention that in patients with history of congestive heart failure may result in shortness of breath, pulmonary edema, and decompensation with resultant heart failure; weight gain; swelling or edema; medication-induced neural toxicity; particulate matter embolism and blood vessel occlusion with resultant organ, and/or nervous system infarction; and/or aseptic necrosis of one or more joints. Finally, the patient was informed that Medicine is not an exact science; therefore, there is also the possibility of unforeseen or unpredictable risks and/or possible complications that may result in a catastrophic outcome. The patient indicated having understood very clearly. We have given the patient no guarantees and we have made no promises. Enough time was given to the patient to ask questions, all of which were answered to the patient's satisfaction. Laura Frey has indicated that she wanted to continue with the procedure. Attestation: I, the ordering provider, attest that I have discussed with the patient the benefits, risks, side-effects, alternatives, likelihood of achieving goals, and potential  problems during recovery for the procedure that I have provided informed consent. Date  Time: 04/12/2018  1:33 PM  Pre-Procedure Preparation:  Monitoring: As per clinic protocol. Respiration, ETCO2, SpO2, BP, heart rate and rhythm monitor placed and checked for adequate function Safety Precautions: Patient was assessed for positional comfort and pressure points before starting the procedure. Time-out: I initiated and conducted the "Time-out" before starting the procedure, as per protocol. The patient was asked to participate by confirming the accuracy of the "Time Out" information. Verification of the correct person, site, and procedure were performed and confirmed by me, the nursing staff, and the patient. "Time-out" conducted as per Joint Commission's Universal Protocol (UP.01.01.01). Time: 1410  Description of Procedure:          Position: Prone with head of the table was raised to facilitate breathing. Target Area: The interlaminar space, initially targeting the lower laminar border of the superior vertebral body. Approach: Paramedial approach. Area Prepped: Entire Posterior Lumbar Region Prepping solution: ChloraPrep (2% chlorhexidine gluconate and 70% isopropyl alcohol) Safety Precautions: Aspiration looking for blood return was conducted prior to all injections. At no point did we inject any substances, as a needle was being advanced. No attempts were made at seeking any paresthesias. Safe injection practices and needle disposal techniques used. Medications properly checked for expiration dates. SDV (single dose vial) medications used. Description of the Procedure: Protocol guidelines were followed. The procedure needle was introduced through the skin, ipsilateral to the reported pain, and advanced to the target area. Bone was contacted and the needle walked caudad, until the lamina was cleared. The epidural space was identified using "loss-of-resistance technique" with 2-3 ml of PF-NaCl (0.9%  NSS), in a 5cc LOR glass syringe. Vitals:   04/12/18 1329 04/12/18 1409 04/12/18 1414 04/12/18 1418  BP: 135/70 (!) 144/72 (!) 145/67 133/88  Pulse: 79     Resp: 16 15 15 16   Temp: 98.3 F (36.8 C)     TempSrc: Oral     SpO2:  96% 94% 95% 95%  Weight: 177 lb (80.3 kg)     Height: 5\' 4"  (1.626 m)       Start Time: 1410 hrs. End Time: 1417 hrs. Materials:  Needle(s) Type: Epidural needle Gauge: 17G Length: 3.5-in Medication(s): Please see orders for medications and dosing details.  Imaging Guidance (Spinal):          Type of Imaging Technique: Fluoroscopy Guidance (Spinal) Indication(s): Assistance in needle guidance and placement for procedures requiring needle placement in or near specific anatomical locations not easily accessible without such assistance. Exposure Time: Please see nurses notes. Contrast: Before injecting any contrast, we confirmed that the patient did not have an allergy to iodine, shellfish, or radiological contrast. Once satisfactory needle placement was completed at the desired level, radiological contrast was injected. Contrast injected under live fluoroscopy. No contrast complications. See chart for type and volume of contrast used. Fluoroscopic Guidance: I was personally present during the use of fluoroscopy. "Tunnel Vision Technique" used to obtain the best possible view of the target area. Parallax error corrected before commencing the procedure. "Direction-depth-direction" technique used to introduce the needle under continuous pulsed fluoroscopy. Once target was reached, antero-posterior, oblique, and lateral fluoroscopic projection used confirm needle placement in all planes. Images permanently stored in EMR. Interpretation: I personally interpreted the imaging intraoperatively. Adequate needle placement confirmed in multiple planes. Appropriate spread of contrast into desired area was observed. No evidence of afferent or efferent intravascular uptake. No  intrathecal or subarachnoid spread observed. Permanent images saved into the patient's record.  Antibiotic Prophylaxis:   Anti-infectives (From admission, onward)   None     Indication(s): None identified  Post-operative Assessment:  Post-procedure Vital Signs:  Pulse/HCG Rate: 7983 Temp: 98.3 F (36.8 C) Resp: 16 BP: 133/88 SpO2: 95 %  EBL: None  Complications: No immediate post-treatment complications observed by team, or reported by patient.  Note: The patient tolerated the entire procedure well. A repeat set of vitals were taken after the procedure and the patient was kept under observation following institutional policy, for this type of procedure. Post-procedural neurological assessment was performed, showing return to baseline, prior to discharge. The patient was provided with post-procedure discharge instructions, including a section on how to identify potential problems. Should any problems arise concerning this procedure, the patient was given instructions to immediately contact us, at any time, without hesitation. In any case, we plan to contact the patient by telephone for a follow-up status report regarding this interventional procedure.  Comments:  No additional relevant information.  Plan of Care    Imaging Orders     DG C-Arm 1-60 Min-No Report  Procedure Orders     Lumbar Epidural Injection  Medications ordered for procedure: Meds ordered this encounter  Medications  . iopamidol (ISOVUE-M) 41 % intrathecal injection 10 mL    Must be Myelogram-compatible. If not available, you may substitute with a water-soluble, non-ionic, hypoallergenic, myelogram-compatible radiological contrast medium.  Marland Kitchen lidocaine (XYLOCAINE) 2 % (with pres) injection 400 mg  . DISCONTD: midazolam (VERSED) 5 MG/5ML injection 1-2 mg    Make sure Flumazenil is available in the pyxis when using this medication. If oversedation occurs, administer 0.2 mg IV over 15 sec. If after 45 sec no  response, administer 0.2 mg again over 1 min; may repeat at 1 min intervals; not to exceed 4 doses (1 mg)  . DISCONTD: fentaNYL (SUBLIMAZE) injection 25-50 mcg    Make sure Narcan is available in the pyxis when using this medication. In  the event of respiratory depression (RR< 8/min): Titrate NARCAN (naloxone) in increments of 0.1 to 0.2 mg IV at 2-3 minute intervals, until desired degree of reversal.  . DISCONTD: lactated ringers infusion 1,000 mL  . sodium chloride flush (NS) 0.9 % injection 2 mL  . ropivacaine (PF) 2 mg/mL (0.2%) (NAROPIN) injection 2 mL  . triamcinolone acetonide (KENALOG-40) injection 40 mg   Medications administered: We administered iopamidol, lidocaine, sodium chloride flush, ropivacaine (PF) 2 mg/mL (0.2%), and triamcinolone acetonide.  See the medical record for exact dosing, route, and time of administration.  New Prescriptions   No medications on file   Disposition: Discharge home  Discharge Date & Time: 04/12/2018; 1422 hrs.   Physician-requested Follow-up: Return for post-procedure eval (2 wks), w/ Dionisio David, NP.  Future Appointments  Date Time Provider [place]  04/25/2018  3:15 PM Shelton Silvas, PT [facility] None  04/26/2018  2:30 PM Vevelyn Francois, NP [facility] None  05/01/2018  4:00 PM Shelton Silvas, PT [facility] None  05/07/2018  3:15 PM Shelton Silvas, PT [facility] None  05/09/2018  2:00 PM Shelton Silvas, PT [facility] None  05/14/2018  4:00 PM Shelton Silvas, PT [facility] None  05/17/2018  2:30 PM Shelton Silvas, PT [facility] None  05/21/2018  2:30 PM Shelton Silvas, PT [facility] None  05/23/2018  1:45 PM Shelton Silvas, PT [facility] None   Primary Care Physician: Marinda Elk, MD Location: [facility] Note by: Gaspar Cola, MD Date: 04/12/2018; Time: 2:29 PM  Disclaimer:  Medicine is not an Chief Strategy Officer. The only guarantee in medicine is that nothing is guaranteed. It is important to note that  the decision to proceed with this intervention was based on the information collected from the patient. The Data and conclusions were drawn from the patient's questionnaire, the interview, and the physical examination. Because the information was provided in large part by the patient, it cannot be guaranteed that it has not been purposely or unconsciously manipulated. Every effort has been made to obtain as much relevant data as possible for this evaluation. It is important to note that the conclusions that lead to this procedure are derived in large part from the available data. Always take into account that the treatment will also be dependent on availability of resources and existing treatment guidelines, considered by other Pain Management Practitioners as being common knowledge and practice, at the time of the intervention. For Medico-Legal purposes, it is also important to point out that variation in procedural techniques and pharmacological choices are the acceptable norm. The indications, contraindications, technique, and results of the above procedure should only be interpreted and judged by a Board-Certified Interventional Pain Specialist with extensive familiarity and expertise in the same exact procedure and technique.

## 2018-04-13 ENCOUNTER — Telehealth: Payer: Self-pay

## 2018-04-13 NOTE — Telephone Encounter (Signed)
Post procedure phone call.  Patient states she hasnt gotten up yet, but will call us for any questions or concerns.

## 2018-04-24 ENCOUNTER — Encounter: Payer: Medicare Other | Admitting: Physical Therapy

## 2018-04-25 ENCOUNTER — Ambulatory Visit: Payer: Medicare Other | Attending: Pain Medicine | Admitting: Physical Therapy

## 2018-04-25 ENCOUNTER — Encounter: Payer: Self-pay | Admitting: Physical Therapy

## 2018-04-25 DIAGNOSIS — G8929 Other chronic pain: Secondary | ICD-10-CM | POA: Diagnosis present

## 2018-04-25 DIAGNOSIS — R29898 Other symptoms and signs involving the musculoskeletal system: Secondary | ICD-10-CM | POA: Insufficient documentation

## 2018-04-25 DIAGNOSIS — M545 Low back pain: Secondary | ICD-10-CM | POA: Diagnosis present

## 2018-04-25 NOTE — Therapy (Signed)
[facility] [address], [place], [address] Phone: [phone]   Fax:  [phone]  Physical Therapy Evaluation  Patient Details  Name: Laura Frey MRN: [id number] Date of Birth: 10-02-1932 Referring Provider: Dr. Consuela Mimes   Encounter Date: 04/25/2018  PT End of Session - 04/25/18 1551    Visit Number  1    Number of Visits  17    Date for PT Re-Evaluation  06/20/18    PT Start Time  0315    PT Stop Time  0415    PT Time Calculation (min)  60 min    Equipment Utilized During Treatment  Gait belt    Activity Tolerance  Patient tolerated treatment well    Behavior During Therapy  [facility] for tasks assessed/performed       Past Medical History:  Diagnosis Date  . Anemia    hx Thalassemia minor anemia  . Asthma   . Cancer ([place])    melanoma on back  . Chronic back pain    ESI/uses BC powders  . Depression   . GERD (gastroesophageal reflux disease)   . Headache(784.0)   . Hypertension   . Hypothyroidism   . Peripheral vascular disease ([facility])    legs  . Sepsis ([place]) 01/27/2014  . Shortness of breath   . Tendinitis of both rotator cuffs     Past Surgical History:  Procedure Laterality Date  . ABDOMINAL HYSTERECTOMY     vagina  . ANTERIOR CERVICAL DECOMP/DISCECTOMY FUSION N/A 05/01/2013   Procedure: ANTERIOR CERVICAL DECOMPRESSION/DISCECTOMY FUSION 3 LEVELS;  Surgeon: Elaina Hoops, MD;  Location: [facility];  Service: Neurosurgery;  Laterality: N/A;  ANTERIOR CERVICAL DECOMPRESSION/DISCECTOMY FUSION 3 LEVELS  . APPENDECTOMY    . BLADDER REPAIR     after hysterectomy same day  . EYE SURGERY Bilateral    cataracts  . TONSILLECTOMY    . TUBAL LIGATION      There were no vitals filed for this visit.   Subjective Assessment - 04/25/18 1527    Subjective  LBP    Pertinent History  Patient is an 84 year old female presenting with LBP and LE weakness. Patient reports having a fall in her yard  while chasing after her yard August, that resulted in a L2 compression fx that she sought treatment for in Dec and began getting lumbar epideral injections then. Following first injection patient reports she was doing "very well" then had a second fall, which she reports set her back- that Xrays revealed no further damage other than L2 compression fx and antero and retrolithesis that was already present- and that she has felt "weaker since". Patient reports she has had an injection following that fall (04/11/18), which made her feel better but reports she still has some LBP, and trouble with balance and walking. Patient reports her LBP is more R sided with "restless R leg at nighttime". Patient reports she has had sciatica previously,  but is not experiencing that "kind of pain now". Patient ambulates with rollator in her home and reports she has to use "scooters" in the store since her two falls (beginning using scooter this year). Patient reports before second fall she did not have to use rollator at home, only in the community. LBP worst pain in past week: 2/10 (reports this is better after epideral 2 weeks ago); best: 0/10. Pt denies N/V, unexplained weight fluctuation, saddle paresthesia, fever, night sweats, or unrelenting night pain at  this time.    Limitations  Lifting;Standing;Walking;House hold activities    How long can you sit comfortably?  unlimited    How long can you stand comfortably?  46min    How long can you walk comfortably?  Less than 5 min    Diagnostic tests  MRI; Xray: L2 Lumbar compression fx, grade 1 anterolisthesis L3 over L4, osteoarthritis, DDD,    Patient Stated Goals  Walk with rollator in her community, decrease LBP    Currently in Pain?  Yes    Pain Location  Back    Pain Orientation  Right;Left;Posterior;Lower    Pain Descriptors / Indicators  Dull;Sharp;Cramping    Pain Type  Chronic pain    Pain Radiating Towards  R hip    Pain Onset  More than a month ago    Pain  Frequency  Intermittent    Aggravating Factors   walking, bending over, lifting, squatting    Pain Relieving Factors  Injections, BC powder    Effect of Pain on Daily Activities  Cannot ambulate in community, cannot ambulate around home without rollater, fall risk    Multiple Pain Sites  No      ROM All lumbar motions 25% limited (tested in sitting) Hip motions restricted d/t soft tissue restriction  Strength Hip flex: L: 4/5 R: 3+/5 Hip abd 3+/5 bilat Hip IR: 4+/5 bilat Hip ER: 3+/5 bilat Knee flex:  4+/5 bilat Knee ext:4+/5 bilat Unable to lay in prone 5xSTS 45sec w/ UE support on armrests   Special Tests/ Other (+) SLR R (+) Crossed SLR (+) 90/90 bilat (+) FABER bilat (+) FADIR bilat (+) Obers bilat (+) Scour bilat TUG: 54 sec BERG: 10/56 (unable to do any standing activities) Good seated balance with feet on floor without UE support, able to resist mild perturbation  Patient transfers chair to chair with multiple attempts, max use of UE and CGA for safety  Ther-Ex -Seated marching x10  (HEP) - Seated DF x10 (HEP) - Seated clamshell yellow x10 (HEP) - LAQ x10 (HEP) -Education on PT role and there-ex role on pain and strengthening/lengthening to increase stability, increase function, and decrease pain                   Objective measurements completed on examination: See above findings.              PT Education - 04/25/18 1551    Education Details  Patient was educated on diagnosis, anatomy and pathology involved, prognosis, role of PT, and was given an HEP, demonstrating exercise with proper form following verbal and tactile cues, and was given a paper hand out to continue exercise at home. Pt was educated on and agreed to plan of care.    Person(s) Educated  Patient    Methods  Explanation;Demonstration;Tactile cues;Verbal cues;Handout    Comprehension  Verbalized understanding;Returned demonstration;Verbal cues required;Tactile cues  required       PT Short Term Goals - 04/26/18 1218      PT SHORT TERM GOAL #1   Title  Pt will be independent with HEP in order to improve strength and balance in order to decrease fall risk and improve function at home and work    Time  4    Period  Weeks    Status  New        PT Long Term Goals - 04/26/18 1238      PT LONG TERM GOAL #1   Title  Pt will  improve BERG by at least 3 points in order to demonstrate clinically significant improvement in balance.    Baseline  10/56 (unable to do any standing activities)    Time  8    Period  Weeks    Status  New      PT LONG TERM GOAL #2   Title  Pt will decrease 5TSTS by at least 3 seconds in order to demonstrate clinically significant improvement in LE strength    Baseline  04/26/18: 45sec w/ UE support on armrests and leg against back of chair    Time  8    Period  Weeks    Status  New      PT LONG TERM GOAL #3   Title  Patient will decrease TUG time to 12sec to demonstrate no fall risk with AD in her home    Baseline  04/26/18 54sec with rollator    Time  16    Period  Weeks    Status  New      PT LONG TERM GOAL #4   Title  Patient will be able to transfer chair to chair safely and independently to increase safety and decrease fall risk at home    Baseline  04/26/18 Patient transfers chair to chair with multiple attempts, max use of UE and CGA for safety    Time  8    Period  Weeks    Status  New             Plan - 04/26/18 1316    Clinical Impression Statement  Patient is an 84 year old female presenting with chronic LBP and balance deficits. Over the past 6 months patient reports loss in functional independence d/t impairments in: LE and core strength/stability, LE and core motion, seated/standing balance, and activity tolerance. Impairments affecting patients ability to ambulate w/ AD over long distances and w/o AD at home, transfer safely, and reach outside BOS. 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    History and Personal Factors relevant to plan of care:  L2 Lumbar compression fx, grade 1 anterolisthesis L3 over L4, osteoarthritis, DDD, chronic R lumbar rediculopathy    Clinical Presentation  Evolving    Clinical Presentation due to:  3 or more personal factors/comorbidities, 4 or more body systems/activity limitations/participation restrictions    Clinical Decision Making  Moderate    Rehab Potential  Fair    Clinical Impairments Affecting Rehab Potential  (-) age, sedentary lifestyle, multiple comorbidities, chronicity of LB sx (+) social support    PT Frequency  2x / week    PT Duration  8 weeks    PT Treatment/Interventions  ADLs/Self Care Home Management;Aquatic Therapy;Electrical Stimulation;Therapeutic exercise;Taping;Balance training;Gait training;DME Instruction;Neuromuscular re-education;Stair training;Wheelchair mobility training;Passive range of motion;Manual techniques;Patient/family education;Therapeutic activities;Moist Heat;Cryotherapy;Functional mobility training    PT Next Visit Plan  HEP and goal review; LE and core strengthening, seated balance    PT Home Exercise Plan  Seated marching, seated DF, seated clamshell (yellow tband), LAQ    Consulted and Agree with Plan of Care  Patient       Patient will benefit from skilled therapeutic intervention in order to improve the following deficits and impairments:  Pain, Improper body mechanics, Impaired sensation, Increased fascial restricitons, Abnormal gait, Postural dysfunction, Impaired tone, Decreased activity tolerance, Decreased endurance, Decreased range of motion, Decreased strength, Decreased balance, Difficulty walking,  Impaired flexibility  Visit Diagnosis: Weakness of both lower extremities  Chronic right-sided low back pain without sciatica     Problem List Patient Active Problem List   Diagnosis  Date Noted  . Lower extremity weakness 04/04/2018  . Lower extremity numbness 04/04/2018  . Disorder of skeletal system 01/01/2018  . Pharmacologic therapy 01/01/2018  . Problems influencing health status 01/01/2018  . Cervicalgia 01/01/2018  . Chronic upper back pain 01/01/2018  . Lower extremity weakness (Bilateral) 01/01/2018  . Frequent falls 12/11/2017  . Gait instability 12/11/2017  . COPD (chronic obstructive pulmonary disease) ([place]) 11/16/2017  . Thalassemia 11/16/2017  . Hypertension 11/16/2017  . Hypothyroid 11/16/2017  . Abnormal MRI, lumbar spine (2018) 11/16/2017  . L2 compression fracture 11/16/2017  . H/O cervical spine surgery (C4-C7 ACDF) 11/16/2017  . Abnormal MRI, cervical spine (2014) 11/16/2017  . Chronic low back pain (Bilateral) (1) (R>L) 11/16/2017  . Lumbar facet joint osteoarthritis (Bilateral) 11/16/2017  . Lumbar facet hypertrophy (Bilateral) 11/16/2017  . Lumbar facet syndrome (Bilateral) 11/16/2017  . Osteoarthritis of lumbar spine 11/16/2017  . Lumbar spondylosis 11/16/2017  . Cervical spondylosis 11/16/2017  . Cervical facet arthropathy (Bilateral) 11/16/2017  . Chronic pain syndrome 11/16/2017  . Osteoarthritis 11/16/2017  . Grade 1 anterolisthesis of L3 over L4 11/16/2017  . Grade 1 retrolisthesis of L5 over S1 11/16/2017  . Lumbar lateral recess stenosis (Bilateral) 11/16/2017  . Chronic lower extremity pain (2) (Bilateral) (R>L) 11/16/2017  . Chronic lower extremity radicular pain (Bilateral) (R>L) 11/16/2017  . Chronic lumbar radiculitis (Right) 11/16/2017  . DDD (degenerative disc disease), lumbar 11/16/2017  . DDD (degenerative disc disease), cervical 11/16/2017  . Hypoxia 02/27/2014  . Anemia, unspecified 02/27/2014  . Urinary tract infection 02/27/2014  . Diastolic CHF, acute ([place]) 02/08/2014  . Acute diastolic heart failure ([place]) 02/08/2014  . Shortness of breath 02/05/2014  . HTN (hypertension) 02/05/2014  . Anemia 02/05/2014  .  SOB (shortness of breath) 02/05/2014  . Nonspecific (abnormal) findings on radiological and other examination of gastrointestinal tract 01/31/2014  . Hypotension, unspecified 01/27/2014  . UTI (urinary tract infection) 01/27/2014  . Unspecified hypothyroidism 01/27/2014  . GERD (gastroesophageal reflux disease) 01/27/2014  . Esophageal reflux 01/27/2014  . Increased frequency of urination 02/12/2013  . Other chronic cystitis without hematuria 02/12/2013  . Recurrent UTI 02/12/2013  . Urge incontinence 02/12/2013   Shelton Silvas PT, DPT Shelton Silvas 04/26/2018, 3:33 PM  [facility] [address] [place], [address] Phone: [phone]   Fax:  [phone]  Name: Laura Frey MRN: [id number] Date of Birth: Aug 23, 1932

## 2018-04-26 ENCOUNTER — Encounter: Payer: Self-pay | Admitting: Nurse Practitioner

## 2018-04-26 ENCOUNTER — Ambulatory Visit: Payer: Medicare Other | Attending: Nurse Practitioner | Admitting: Nurse Practitioner

## 2018-04-26 ENCOUNTER — Encounter: Payer: Self-pay | Admitting: Physical Therapy

## 2018-04-26 VITALS — BP 130/75 | HR 80 | Temp 98.7°F | Resp 18 | Ht 62.0 in | Wt 177.0 lb

## 2018-04-26 DIAGNOSIS — I110 Hypertensive heart disease with heart failure: Secondary | ICD-10-CM | POA: Diagnosis not present

## 2018-04-26 DIAGNOSIS — E039 Hypothyroidism, unspecified: Secondary | ICD-10-CM | POA: Insufficient documentation

## 2018-04-26 DIAGNOSIS — I5031 Acute diastolic (congestive) heart failure: Secondary | ICD-10-CM | POA: Diagnosis not present

## 2018-04-26 DIAGNOSIS — F329 Major depressive disorder, single episode, unspecified: Secondary | ICD-10-CM | POA: Diagnosis not present

## 2018-04-26 DIAGNOSIS — M5116 Intervertebral disc disorders with radiculopathy, lumbar region: Secondary | ICD-10-CM | POA: Insufficient documentation

## 2018-04-26 DIAGNOSIS — Z885 Allergy status to narcotic agent status: Secondary | ICD-10-CM | POA: Diagnosis not present

## 2018-04-26 DIAGNOSIS — Z888 Allergy status to other drugs, medicaments and biological substances status: Secondary | ICD-10-CM | POA: Diagnosis not present

## 2018-04-26 DIAGNOSIS — M4726 Other spondylosis with radiculopathy, lumbar region: Secondary | ICD-10-CM

## 2018-04-26 DIAGNOSIS — R531 Weakness: Secondary | ICD-10-CM | POA: Diagnosis not present

## 2018-04-26 DIAGNOSIS — J449 Chronic obstructive pulmonary disease, unspecified: Secondary | ICD-10-CM | POA: Diagnosis not present

## 2018-04-26 DIAGNOSIS — Z79899 Other long term (current) drug therapy: Secondary | ICD-10-CM | POA: Diagnosis not present

## 2018-04-26 DIAGNOSIS — Z881 Allergy status to other antibiotic agents status: Secondary | ICD-10-CM | POA: Diagnosis not present

## 2018-04-26 DIAGNOSIS — Z5181 Encounter for therapeutic drug level monitoring: Secondary | ICD-10-CM | POA: Diagnosis present

## 2018-04-26 DIAGNOSIS — Z87891 Personal history of nicotine dependence: Secondary | ICD-10-CM | POA: Diagnosis not present

## 2018-04-26 DIAGNOSIS — Z882 Allergy status to sulfonamides status: Secondary | ICD-10-CM | POA: Diagnosis not present

## 2018-04-26 DIAGNOSIS — Z886 Allergy status to analgesic agent status: Secondary | ICD-10-CM | POA: Diagnosis not present

## 2018-04-26 DIAGNOSIS — K219 Gastro-esophageal reflux disease without esophagitis: Secondary | ICD-10-CM | POA: Insufficient documentation

## 2018-04-26 DIAGNOSIS — M5416 Radiculopathy, lumbar region: Secondary | ICD-10-CM

## 2018-04-26 DIAGNOSIS — R29898 Other symptoms and signs involving the musculoskeletal system: Secondary | ICD-10-CM | POA: Diagnosis not present

## 2018-04-26 DIAGNOSIS — M48061 Spinal stenosis, lumbar region without neurogenic claudication: Secondary | ICD-10-CM | POA: Insufficient documentation

## 2018-04-26 DIAGNOSIS — M47812 Spondylosis without myelopathy or radiculopathy, cervical region: Secondary | ICD-10-CM | POA: Diagnosis not present

## 2018-04-26 DIAGNOSIS — G894 Chronic pain syndrome: Secondary | ICD-10-CM

## 2018-04-26 DIAGNOSIS — I739 Peripheral vascular disease, unspecified: Secondary | ICD-10-CM | POA: Insufficient documentation

## 2018-04-26 NOTE — Progress Notes (Addendum)
Patient's Name: Laura Frey  MRN: [id number]  Referring Provider: Marinda Elk, MD  DOB: 22-Feb-1932  PCP: Marinda Elk, MD  DOS: 04/26/2018  Note by: Vevelyn Francois NP  Service setting: Ambulatory outpatient  Specialty: Interventional Pain Management  Location: [facility] (AMB) Pain Management Facility    Patient type: Established    Primary Reason(s) for Visit: Encounter for prescription drug management & post-procedure evaluation of chronic illness with mild to moderate exacerbation(Level of risk: moderate) CC: Follow-up (post procedure )  HPI  Laura Frey is a 84 y.o. year old, female patient, who comes today for a post-procedure evaluation and medication management. She has Hypotension, unspecified; UTI (urinary tract infection); Unspecified hypothyroidism; GERD (gastroesophageal reflux disease); Nonspecific (abnormal) findings on radiological and other examination of gastrointestinal tract; Shortness of breath; HTN (hypertension); Anemia; SOB (shortness of breath); Diastolic CHF, acute (HCC); COPD (chronic obstructive pulmonary disease) ([place]); Hypoxia; Increased frequency of urination; Other chronic cystitis without hematuria; Recurrent UTI; Thalassemia; Urge incontinence; Anemia, unspecified; Acute diastolic heart failure ([place]); Esophageal reflux; Hypertension; Hypothyroid; Urinary tract infection; Abnormal MRI, lumbar spine (2018); L2 compression fracture; H/O cervical spine surgery (C4-C7 ACDF); Abnormal MRI, cervical spine (2014); Chronic low back pain (Bilateral) (1) (R>L); Lumbar facet joint osteoarthritis (Bilateral); Lumbar facet hypertrophy (Bilateral); Lumbar facet syndrome (Bilateral); Osteoarthritis of lumbar spine; Lumbar spondylosis; Cervical spondylosis; Cervical facet arthropathy (Bilateral); Chronic pain syndrome; Osteoarthritis; Grade 1 anterolisthesis of L3 over L4; Grade 1 retrolisthesis of L5 over S1; Lumbar lateral recess stenosis (Bilateral); Chronic lower extremity  pain (2) (Bilateral) (R>L); Chronic lower extremity radicular pain (Bilateral) (R>L); Chronic lumbar radiculitis (Right); DDD (degenerative disc disease), lumbar; DDD (degenerative disc disease), cervical; Frequent falls; Gait instability; Disorder of skeletal system; Pharmacologic therapy; Problems influencing health status; Cervicalgia; Chronic upper back pain; Lower extremity weakness (Bilateral); Lower extremity weakness; and Lower extremity numbness on their problem list. Her primarily concern today is the Follow-up (post procedure )  Pain Assessment: Modifying factors: Epidural injection. No pain at this time BP: 130/75  HR: 80  Laura Frey was last seen on 04/13/2018 for a procedure. During today's appointment we reviewed Laura Frey's post-procedure results, as well as her outpatient medication regimen.  Further details on both, my assessment(s), as well as the proposed treatment plan, please see below.   Post-Procedure Assessment  6/27/19Procedure: LESI Pre-procedure pain score:  5/10 Post-procedure pain score: 0/10         Influential Factors: BMI: 32.37 kg/m Intra-procedural challenges: None observed.         Assessment challenges: None detected.              Reported side-effects: None.        Post-procedural adverse reactions or complications: None reported         Sedation: Please see nurses note. When no sedatives are used, the analgesic levels obtained are directly associated to the effectiveness of the local anesthetics. However, when sedation is provided, the level of analgesia obtained during the initial 1 hour following the intervention, is believed to be the result of a combination of factors. These factors may include, but are not limited to: 1. The effectiveness of the local anesthetics used. 2. The effects of the analgesic(s) and/or anxiolytic(s) used. 3. The degree of discomfort experienced by the patient at the time of the procedure. 4. The patients ability and  reliability in recalling and recording the events. 5. The presence and influence of possible secondary gains and/or psychosocial factors. Reported result: Relief experienced during  the 1st hour after the procedure: 100 % (Ultra-Short Term Relief)            Interpretative annotation: Clinically appropriate result. Analgesia during this period is likely to be Local Anesthetic and/or IV Sedative (Analgesic/Anxiolytic) related.          Effects of local anesthetic: The analgesic effects attained during this period are directly associated to the localized infiltration of local anesthetics and therefore cary significant diagnostic value as to the etiological location, or anatomical origin, of the pain. Expected duration of relief is directly dependent on the pharmacodynamics of the local anesthetic used. Long-acting (4-6 hours) anesthetics used.  Reported result: Relief during the next 4 to 6 hour after the procedure: 100 % (Short-Term Relief)            Interpretative annotation: Clinically appropriate result. Analgesia during this period is likely to be Local Anesthetic-related.          Long-term benefit: Defined as the period of time past the expected duration of local anesthetics (1 hour for short-acting and 4-6 hours for long-acting). With the possible exception of prolonged sympathetic blockade from the local anesthetics, benefits during this period are typically attributed to, or associated with, other factors such as analgesic sensory neuropraxia, antiinflammatory effects, or beneficial biochemical changes provided by agents other than the local anesthetics.  Reported result: Extended relief following procedure: 100 % (Long-Term Relief)            Interpretative annotation: Clinically appropriate result. Good relief. No permanent benefit expected. Inflammation plays a part in the etiology to the pain.          Current benefits: Defined as reported results that persistent at this point in time.    Analgesia: 75-100 %            Function: Somewhat improved ROM: Somewhat improved Interpretative annotation: Recurrence of symptoms. No permanent benefit expected. Effective diagnostic intervention.          Interpretation: Results would suggest a successful diagnostic intervention.                  Plan:  Please see "Plan of Care" for details.                Laboratory Chemistry  Inflammation Markers (CRP: Acute Phase) (ESR: Chronic Phase) Lab Results  Component Value Date   CRP 1.0 01/01/2018   ESRSEDRATE 12 01/01/2018   LATICACIDVEN 1.3 01/29/2014                         Rheumatology Markers No results found for: RF, ANA, LABURIC, URICUR, LYMEIGGIGMAB, LYMEABIGMQN, HLAB27                      Renal Function Markers Lab Results  Component Value Date   BUN 19 01/01/2018   CREATININE 0.98 01/01/2018   BCR 19 01/01/2018   GFRAA 62 01/01/2018   GFRNONAA 54 (L) 01/01/2018                             Hepatic Function Markers Lab Results  Component Value Date   AST 23 01/01/2018   ALT 13 02/05/2014   ALBUMIN 4.5 01/01/2018   ALKPHOS 91 01/01/2018                        Electrolytes Lab Results  Component Value Date  NA 139 01/01/2018   K 4.2 01/01/2018   CL 96 01/01/2018   CALCIUM 9.3 01/01/2018   MG 2.2 01/01/2018                        Neuropathy Markers Lab Results  Component Value Date   VITAMINB12 365 01/01/2018   HGBA1C 6.0 (H) 01/27/2014                        Bone Pathology Markers Lab Results  Component Value Date   25OHVITD1 29 (L) 01/01/2018   25OHVITD2 <1.0 01/01/2018   25OHVITD3 28 01/01/2018                         Coagulation Parameters Lab Results  Component Value Date   INR 1.31 01/30/2014   LABPROT 16.0 (H) 01/30/2014   APTT 39 (H) 01/29/2014   PLT 382 02/08/2014   DDIMER 9.34 (H) 02/05/2014                        Cardiovascular Markers Lab Results  Component Value Date   CKTOTAL 206 (H) 02/04/2014   CKMB 10.4 (H)  02/04/2014   TROPONINI <0.30 02/05/2014   HGB 9.1 (L) 02/08/2014   HCT 27.8 (L) 02/08/2014                         CA Markers No results found for: CEA, CA125, LABCA2                      Note: Lab results reviewed.  Recent Diagnostic Imaging Results  DG C-Arm 1-60 Min-No Report Fluoroscopy was utilized by the requesting physician.  No radiographic  interpretation.   Complexity Note: Imaging results reviewed. Results shared with Laura Frey, using Layman's terms.                         Meds   Current Outpatient Medications:  .  albuterol (PROVENTIL HFA;VENTOLIN HFA) 108 (90 BASE) MCG/ACT inhaler, Inhale 2 puffs into the lungs every 6 (six) hours as needed for wheezing., Disp: , Rfl:  .  amLODipine (NORVASC) 5 MG tablet, Take 2 tablets (10 mg total) by mouth daily., Disp: 60 tablet, Rfl: 0 .  Cholecalciferol (VITAMIN D) 2000 UNITS tablet, Take 2,000 Units by mouth daily., Disp: , Rfl:  .  DULOXETINE HCL PO, Take by mouth daily., Disp: , Rfl:  .  fexofenadine (ALLEGRA) 180 MG tablet, Take 180 mg by mouth daily., Disp: , Rfl:  .  furosemide (LASIX) 20 MG tablet, Take 1 tablet (20 mg total) by mouth daily., Disp: 30 tablet, Rfl: 0 .  guaifenesin (ROBITUSSIN) 100 MG/5ML syrup, Take 200 mg by mouth 3 (three) times daily as needed for cough., Disp: , Rfl:  .  levothyroxine (SYNTHROID, LEVOTHROID) 75 MCG tablet, Take 75 mcg by mouth daily before breakfast., Disp: , Rfl:  .  loperamide (IMODIUM) 2 MG capsule, Take 1 capsule (2 mg total) by mouth 2 (two) times daily as needed for diarrhea or loose stools., Disp: 20 capsule, Rfl: 0 .  magnesium oxide (MAG-OX) 400 MG tablet, Take 400 mg by mouth daily., Disp: , Rfl:  .  metoprolol (TOPROL-XL) 200 MG 24 hr tablet, Take 200 mg by mouth daily., Disp: , Rfl:  .  Multiple Vitamin (MULTIVITAMIN WITH MINERALS) TABS,  Take 1 tablet by mouth daily., Disp: , Rfl:  .  omeprazole (PRILOSEC) 20 MG capsule, Take 20 mg by mouth daily., Disp: , Rfl:  .   potassium chloride SA (K-DUR,KLOR-CON) 20 MEQ tablet, Take 2 tablets (40 mEq total) by mouth 2 (two) times daily with a meal., Disp: 60 tablet, Rfl: 0 .  solifenacin (VESICARE) 5 MG tablet, Take 5 mg by mouth daily., Disp: , Rfl:  .  tolterodine (DETROL LA) 2 MG 24 hr capsule, Take 2 mg by mouth daily., Disp: , Rfl:  .  tolterodine (DETROL LA) 4 MG 24 hr capsule, , Disp: , Rfl: 0 .  vitamin C (ASCORBIC ACID) 500 MG tablet, Take 500 mg by mouth daily., Disp: , Rfl:  .  VITAMIN E PO, Take 500 Units by mouth daily., Disp: , Rfl:  .  estrogens, conjugated, (PREMARIN) 0.45 MG tablet, Take 0.45 mg by mouth daily. , Disp: , Rfl:   ROS  Constitutional: Denies any fever or chills Gastrointestinal: No reported hemesis, hematochezia, vomiting, or acute GI distress Musculoskeletal: Denies any acute onset joint swelling, redness, loss of ROM, or weakness Neurological: No reported episodes of acute onset apraxia, aphasia, dysarthria, agnosia, amnesia, paralysis, loss of coordination, or loss of consciousness  Allergies  Laura Frey is allergic to ciprofloxacin; hydrocodone; oxycodone; zoloft [sertraline hcl]; sertraline; sulfa antibiotics; tylenol [acetaminophen]; and ultram [tramadol].  [place]  Drug: Laura Frey  reports that she does not use drugs. Alcohol:  reports that she does not drink alcohol. Tobacco:  reports that she quit smoking about 23 years ago. Her smoking use included cigarettes. She has a 30.00 pack-year smoking history. She has never used smokeless tobacco. Medical:  has a past medical history of Anemia, Asthma, Cancer ([place]), Chronic back pain, Depression, GERD (gastroesophageal reflux disease), Headache(784.0), Hypertension, Hypothyroidism, Peripheral vascular disease ([place]), Sepsis ([place]) (01/27/2014), Shortness of breath, and Tendinitis of both rotator cuffs. Surgical: Laura Frey  has a past surgical history that includes Tubal ligation; Appendectomy; Tonsillectomy; Eye surgery (Bilateral);  Abdominal hysterectomy; Bladder repair; and Anterior cervical decomp/discectomy fusion (N/A, 05/01/2013). Family: family history includes Thalassemia in her paternal aunt and son.  Constitutional Exam  General appearance: Well nourished, well developed, and well hydrated. In no apparent acute distress Vitals:   04/26/18 1448  BP: 130/75  Pulse: 80  Resp: 18  Temp: 98.7 F (37.1 C)  SpO2: 95%  Weight: 177 lb (80.3 kg)  Height: '5\' 2"'  (1.575 m)   BMI Assessment: Estimated body mass index is 32.37 kg/m as calculated from the following:   Height as of this encounter: '5\' 2"'  (1.575 m).   Weight as of this encounter: 177 lb (80.3 kg). Psych/Mental status: Alert, oriented x 3 (person, place, & time)       Eyes: PERLA Respiratory: No evidence of acute respiratory distress  Alignment: Symmetrical Functional ROM: Unrestricted ROM       Stability: No instability detected Muscle Tone/Strength: Functionally intact. No obvious neuro-muscular anomalies detected. Sensory (Neurological): Unimpaired Palpation: No palpable anomalies       Provocative Tests: Lumbar Hyperextension/rotation test: deferred today       Lumbar quadrant test (Kemp's test): deferred today       Lumbar Lateral bending test: deferred today       Patrick's Maneuver: deferred today                   FABER test: deferred today  Thigh-thrust test: deferred today       S-I compression test: deferred today       S-I distraction test: deferred today        Gait & Posture Assessment  Ambulation: Patient came in today in a wheel chair Gait: Relatively normal for age and body habitus Posture: WNL   Lower Extremity Exam    Side: Right lower extremity  Side: Left lower extremity  Stability: No instability observed          Stability: No instability observed          Skin & Extremity Inspection: Skin color, temperature, and hair growth are WNL. No peripheral edema or cyanosis. No masses, redness, swelling,  asymmetry, or associated skin lesions. No contractures.  Skin & Extremity Inspection: Skin color, temperature, and hair growth are WNL. No peripheral edema or cyanosis. No masses, redness, swelling, asymmetry, or associated skin lesions. No contractures.  Functional ROM: Unrestricted ROM                  Functional ROM: Unrestricted ROM                  Muscle Tone/Strength: Functionally intact. No obvious neuro-muscular anomalies detected.  Muscle Tone/Strength: Functionally intact. No obvious neuro-muscular anomalies detected.  Sensory (Neurological): Unimpaired  Sensory (Neurological): Unimpaired  Palpation: No palpable anomalies  Palpation: No palpable anomalies   Assessment  Primary Diagnosis & Pertinent Problem List: The primary encounter diagnosis was Lumbar spondylosis. Diagnoses of Lower extremity weakness (Bilateral), Chronic lumbar radiculitis (Right), Cervical spondylosis, and Chronic pain syndrome were also pertinent to this visit.  Status Diagnosis  Controlled Controlled Controlled 1. Lumbar spondylosis   2. Lower extremity weakness (Bilateral)   3. Chronic lumbar radiculitis (Right)   4. Cervical spondylosis   5. Chronic pain syndrome     Problems updated and reviewed during this visit: No problems updated. Plan of Care  Pharmacotherapy (Medications Ordered): No orders of the defined types were placed in this encounter.  New Prescriptions   No medications on file   Medications administered today: Laura Frey had no medications administered during this visit. Lab-work, procedure(s), and/or referral(s): No orders of the defined types were placed in this encounter.  Imaging and/or referral(s): None  Interventional management options: Planned, scheduled, and/or pending:   Not at the time. May hold off on Aqua therapy secondary to dislike of water and frquency of UTIs    Considering:   Diagnostic right-sided L3-4 interlaminarLESI#3 Diagnostic bilateral  L3-4, L4-5 and/or L5-S1 transforaminal ESI  Diagnostic bilateral L2-3 transforaminal ESI  Diagnostic bilateral lumbar facet block  Possible bilateral lumbar facet RFA  Diagnostic cervical facet block  Possible bilateral cervical facet RFA  Diagnostic midline  Cervical ESI    Palliative PRN treatment(s):   Diagnostic right-sided L3-4 interlaminarLESI       Provider-requested follow-up: Return for (PRN).  Future Appointments  Date Time Provider [place]  05/01/2018  4:00 PM Shelton Silvas, PT [facility] None  05/07/2018  3:15 PM Shelton Silvas, PT [facility] None  05/10/2018 11:15 AM Cleophus Molt E, PTA [facility] None  05/14/2018  4:00 PM Shelton Silvas, PT [facility] None  05/17/2018 12:15 PM Cleophus Molt E, PTA [facility] None  05/21/2018  2:30 PM Shelton Silvas, PT [facility] None  05/24/2018  9:45 AM Cleophus Molt E, PTA [facility] None  05/29/2018  4:00 PM Shelton Silvas, PT [facility] None  05/31/2018 12:15 PM Cleophus Molt E, PTA [facility] None  06/05/2018  3:15 PM  Shelton Silvas, PT [facility] None  06/07/2018 11:30 AM Cleophus Molt E, PTA [facility] None  06/12/2018  2:30 PM Shelton Silvas, PT [facility] None  06/14/2018  9:45 AM Cleophus Molt E, PTA [facility] None  06/19/2018  2:30 PM Shelton Silvas, PT [facility] None  06/21/2018  2:15 PM Cleophus Molt E, PTA [facility] None  06/26/2018  2:30 PM Shelton Silvas, PT [facility] None  06/28/2018 12:15 PM Cleophus Molt E, PTA [facility] None  07/03/2018  1:00 PM Shelton Silvas, PT [facility] None  07/05/2018 12:15 PM Cleophus Molt E, PTA [facility] None  07/10/2018 11:15 AM Cleophus Molt E, PTA [facility] None  07/12/2018  1:45 PM Shelton Silvas, PT [facility] None   Primary Care Physician: Marinda Elk, MD Location: [facility] Out[facility] Facility Note by: Vevelyn Francois NP Date: 04/26/2018; Time: 4:20 PM  Pain Score Disclaimer: We use the NRS-11 scale. This is a self-reported, subjective measurement of pain severity with only  modest accuracy. It is used primarily to identify changes within a particular patient. It must be understood that outpatient pain scales are significantly less accurate that those used for research, where they can be applied under ideal controlled circumstances with minimal exposure to variables. In reality, the score is likely to be a combination of pain intensity and pain affect, where pain affect describes the degree of emotional arousal or changes in action readiness caused by the sensory experience of pain. Factors such as social and work situation, setting, emotional state, anxiety levels, expectation, and prior pain experience may influence pain perception and show large inter-individual differences that may also be affected by time variables.  Patient instructions provided during this appointment: There are no Patient Instructions on file for this visit.

## 2018-04-26 NOTE — Progress Notes (Signed)
Safety precautions to be maintained throughout the outpatient stay will include: orient to surroundings, keep bed in low position, maintain call bell within reach at all times, provide assistance with transfer out of bed and ambulation.  

## 2018-05-01 ENCOUNTER — Encounter: Payer: Self-pay | Admitting: Physical Therapy

## 2018-05-01 ENCOUNTER — Ambulatory Visit: Payer: Medicare Other | Admitting: Physical Therapy

## 2018-05-01 DIAGNOSIS — R29898 Other symptoms and signs involving the musculoskeletal system: Secondary | ICD-10-CM

## 2018-05-01 NOTE — Therapy (Signed)
[place] [facility] [address], [place], [address] Phone: [phone]   Fax:  [phone]  Physical Therapy Treatment  Patient Details  Name: Laura Frey MRN: [id number] Date of Birth: [birth year] Referring Provider: Dr. Consuela Mimes   Encounter Date: 05/01/2018  PT End of Session - 05/01/18 1617    Visit Number  2    Number of Visits  17    Date for PT Re-Evaluation  06/20/18    PT Start Time  0405    PT Stop Time  0445    PT Time Calculation (min)  40 min    Equipment Utilized During Treatment  Gait belt    Activity Tolerance  Patient tolerated treatment well    Behavior During Therapy  [facility] for tasks assessed/performed       Past Medical History:  Diagnosis Date  . Anemia    hx Thalassemia minor anemia  . Asthma   . Cancer ([place])    melanoma on back  . Chronic back pain    ESI/uses BC powders  . Depression   . GERD (gastroesophageal reflux disease)   . Headache(784.0)   . Hypertension   . Hypothyroidism   . Peripheral vascular disease (HCC)    legs  . Sepsis ([place]) 01/27/2014  . Shortness of breath   . Tendinitis of both rotator cuffs     Past Surgical History:  Procedure Laterality Date  . ABDOMINAL HYSTERECTOMY     vagina  . ANTERIOR CERVICAL DECOMP/DISCECTOMY FUSION N/A 05/01/2013   Procedure: ANTERIOR CERVICAL DECOMPRESSION/DISCECTOMY FUSION 3 LEVELS;  Surgeon: Elaina Hoops, MD;  Location: [place] [facility];  Service: Neurosurgery;  Laterality: N/A;  ANTERIOR CERVICAL DECOMPRESSION/DISCECTOMY FUSION 3 LEVELS  . APPENDECTOMY    . BLADDER REPAIR     after hysterectomy same day  . EYE SURGERY Bilateral    cataracts  . TONSILLECTOMY    . TUBAL LIGATION      There were no vitals filed for this visit.  Subjective Assessment - 05/01/18 1609    Subjective  Patient reports her "LBP has returned" and reports this pain is a 5/10 when she is standing/walking. Patient reports she has been "somewhat  compliant" with her HEP. PT asked patient if her HEP causes her pain, and she said no that she just doesn't feel "up to completing them". Patient reports that "with her bladder" she has decided that she does not think the aquatic therapy is a good idea at this time.     Pertinent History  Patient is an 84 year old female presenting with LBP and LE weakness. Patient reports having a fall in her yard while chasing after her yard August, that resulted in a L2 compression fx that she sought treatment for in Dec and began getting lumbar epideral injections then. Following first injection patient reports she was doing "very well" then had a second fall, which she reports set her back- that Xrays revealed no further damage other than L2 compression fx and antero and retrolithesis that was already present- and that she has felt "weaker since". Patient reports she has had an injection following that fall (04/11/18), which made her feel better but reports she still has some LBP, and trouble with balance and walking. Patient reports her LBP is more R sided with "restless R leg at nighttime". Patient reports she has had sciatica previously,  but is not experiencing that "kind of pain now". Patient ambulates with rollator in her  home and reports she has to use "scooters" in the store since her two falls (beginning using scooter this year). Patient reports before second fall she did not have to use rollator at home, only in the community. LBP worst pain in past week: 2/10 (reports this is better after epideral 2 weeks ago); best: 0/10. Pt denies N/V, unexplained weight fluctuation, saddle paresthesia, fever, night sweats, or unrelenting night pain at this time.    Limitations  Lifting;Standing;Walking;House hold activities    How long can you sit comfortably?  unlimited    How long can you stand comfortably?  46min    How long can you walk comfortably?  Less than 5 min    Diagnostic tests  MRI; Xray: L2 Lumbar compression  fx, grade 1 anterolisthesis L3 over L4, osteoarthritis, DDD,    Patient Stated Goals  Walk with rollator in her community, decrease LBP    Pain Onset  More than a month ago       Ther-Ex -Seated marching 3x 10 with 2 sec hold and min cuing to prevent extension compensation and for eccentric control (HEP review) -Seated DF 3x 10 (HEP review) -Seated clamshell yellow tband 3x 10 with cuing for proper form initially with good carry over between sets (HEP review)   Therapeutic Activity  -STS from elevated mat table with patient requiring mod assist from highest mat table elevation. Over multiple trials PT attempted to utilize TC/VC and demonstration to elicit glute activation from patient. With PT assisting, patient continuously attempted to lean back to prevent fall with standing. After repeated trials and cuing patient was able to stand with neutral posture and modA 50% correctly  -Seated balance against perturbations with LE support on floor and without  (increased sway without LE support) -Seated balance with reaching outside BOS with LE support on floor and without (increased sway without LE support)                 PT Education - 05/01/18 1617    Education Details  Exercise form    Person(s) Educated  Patient    Methods  Explanation;Demonstration;Tactile cues;Verbal cues    Comprehension  Verbal cues required;Returned demonstration;Verbalized understanding;Tactile cues required       PT Short Term Goals - 04/26/18 1218      PT SHORT TERM GOAL #1   Title  Pt will be independent with HEP in order to improve strength and balance in order to decrease fall risk and improve function at home and work    Time  4    Period  Weeks    Status  New        PT Long Term Goals - 04/26/18 1238      PT LONG TERM GOAL #1   Title  Pt will improve BERG by at least 3 points in order to demonstrate clinically significant improvement in balance.    Baseline  10/56 (unable to do any  standing activities)    Time  8    Period  Weeks    Status  New      PT LONG TERM GOAL #2   Title  Pt will decrease 5TSTS by at least 3 seconds in order to demonstrate clinically significant improvement in LE strength    Baseline  04/26/18: 45sec w/ UE support on armrests and leg against back of chair    Time  8    Period  Weeks    Status  New  PT LONG TERM GOAL #3   Title  Patient will decrease TUG time to 12sec to demonstrate no fall risk with AD in her home    Baseline  04/26/18 54sec with rollator    Time  16    Period  Weeks    Status  New      PT LONG TERM GOAL #4   Title  Patient will be able to transfer chair to chair safely and independently to increase safety and decrease fall risk at home    Baseline  04/26/18 Patient transfers chair to chair with multiple attempts, max use of UE and CGA for safety    Time  8    Period  Weeks    Status  New            Plan - 05/01/18 1646    Clinical Impression Statement  PT completed resistance training in seated, as patient is unable to lie in supine and prone. Patient has severe difficulty with glute activation and activation, which she is able to correct 50% with max cuing from PT, continuing to require modA. Pt is demonstrating good seated balance. PT will continue to advance functional movement as able.     Rehab Potential  Fair    Clinical Impairments Affecting Rehab Potential  (-) age, sedentary lifestyle, multiple comorbidities, chronicity of LB sx (+) social support    PT Frequency  2x / week    PT Duration  8 weeks    PT Treatment/Interventions  ADLs/Self Care Home Management;Aquatic Therapy;Electrical Stimulation;Therapeutic exercise;Taping;Balance training;Gait training;DME Instruction;Neuromuscular re-education;Stair training;Wheelchair mobility training;Passive range of motion;Manual techniques;Patient/family education;Therapeutic activities;Moist Heat;Cryotherapy;Functional mobility training    PT Next Visit Plan   quadraped glute strengthening, LE and core strengthening, seated balance    PT Home Exercise Plan  Seated marching, seated DF, seated clamshell (yellow tband), LAQ    Consulted and Agree with Plan of Care  Patient       Patient will benefit from skilled therapeutic intervention in order to improve the following deficits and impairments:  Pain, Improper body mechanics, Impaired sensation, Increased fascial restricitons, Abnormal gait, Postural dysfunction, Impaired tone, Decreased activity tolerance, Decreased endurance, Decreased range of motion, Decreased strength, Decreased balance, Difficulty walking, Impaired flexibility  Visit Diagnosis: Weakness of both lower extremities     Problem List Patient Active Problem List   Diagnosis Date Noted  . Lower extremity weakness 04/04/2018  . Lower extremity numbness 04/04/2018  . Disorder of skeletal system 01/01/2018  . Pharmacologic therapy 01/01/2018  . Problems influencing health status 01/01/2018  . Cervicalgia 01/01/2018  . Chronic upper back pain 01/01/2018  . Lower extremity weakness (Bilateral) 01/01/2018  . Frequent falls 12/11/2017  . Gait instability 12/11/2017  . COPD (chronic obstructive pulmonary disease) ([place]) 11/16/2017  . Thalassemia 11/16/2017  . Hypertension 11/16/2017  . Hypothyroid 11/16/2017  . Abnormal MRI, lumbar spine (2018) 11/16/2017  . L2 compression fracture 11/16/2017  . H/O cervical spine surgery (C4-C7 ACDF) 11/16/2017  . Abnormal MRI, cervical spine (2014) 11/16/2017  . Chronic low back pain (Bilateral) (1) (R>L) 11/16/2017  . Lumbar facet joint osteoarthritis (Bilateral) 11/16/2017  . Lumbar facet hypertrophy (Bilateral) 11/16/2017  . Lumbar facet syndrome (Bilateral) 11/16/2017  . Osteoarthritis of lumbar spine 11/16/2017  . Lumbar spondylosis 11/16/2017  . Cervical spondylosis 11/16/2017  . Cervical facet arthropathy (Bilateral) 11/16/2017  . Chronic pain syndrome 11/16/2017  . Osteoarthritis  11/16/2017  . Grade 1 anterolisthesis of L3 over L4 11/16/2017  . Grade 1 retrolisthesis  of L5 over S1 11/16/2017  . Lumbar lateral recess stenosis (Bilateral) 11/16/2017  . Chronic lower extremity pain (2) (Bilateral) (R>L) 11/16/2017  . Chronic lower extremity radicular pain (Bilateral) (R>L) 11/16/2017  . Chronic lumbar radiculitis (Right) 11/16/2017  . DDD (degenerative disc disease), lumbar 11/16/2017  . DDD (degenerative disc disease), cervical 11/16/2017  . Hypoxia 02/27/2014  . Anemia, unspecified 02/27/2014  . Urinary tract infection 02/27/2014  . Diastolic CHF, acute ([place]) 02/08/2014  . Acute diastolic heart failure ([place]) 02/08/2014  . Shortness of breath 02/05/2014  . HTN (hypertension) 02/05/2014  . Anemia 02/05/2014  . SOB (shortness of breath) 02/05/2014  . Nonspecific (abnormal) findings on radiological and other examination of gastrointestinal tract 01/31/2014  . Hypotension, unspecified 01/27/2014  . UTI (urinary tract infection) 01/27/2014  . Unspecified hypothyroidism 01/27/2014  . GERD (gastroesophageal reflux disease) 01/27/2014  . Esophageal reflux 01/27/2014  . Increased frequency of urination 02/12/2013  . Other chronic cystitis without hematuria 02/12/2013  . Recurrent UTI 02/12/2013  . Urge incontinence 02/12/2013   Shelton Silvas PT, DPT  Shelton Silvas 05/01/2018, 5:02 PM  [place] [facility] [address] [place], [address] Phone: [phone]   Fax:  [phone]  Name: Laura Frey MRN: [id number] Date of Birth: 1931-12-17

## 2018-05-02 ENCOUNTER — Ambulatory Visit: Payer: Medicare Other | Admitting: Physical Therapy

## 2018-05-07 ENCOUNTER — Encounter: Payer: Self-pay | Admitting: Physical Therapy

## 2018-05-07 ENCOUNTER — Ambulatory Visit: Payer: Medicare Other | Admitting: Physical Therapy

## 2018-05-07 DIAGNOSIS — G8929 Other chronic pain: Secondary | ICD-10-CM

## 2018-05-07 DIAGNOSIS — R29898 Other symptoms and signs involving the musculoskeletal system: Secondary | ICD-10-CM | POA: Diagnosis not present

## 2018-05-07 DIAGNOSIS — M545 Low back pain: Secondary | ICD-10-CM

## 2018-05-07 NOTE — Therapy (Signed)
[place] [facility] [address], [place], [address] Phone: [phone]   Fax:  [phone]  Physical Therapy Treatment  Patient Details  Name: Laura Frey MRN: [id number] Date of Birth: Feb 27, 1932 Referring Provider: Dr. Consuela Mimes   Encounter Date: 05/07/2018  PT End of Session - 05/07/18 1531    Visit Number  3    Number of Visits  17    Date for PT Re-Evaluation  06/20/18    PT Start Time  0323    PT Stop Time  0401    PT Time Calculation (min)  38 min    Equipment Utilized During Treatment  Gait belt    Activity Tolerance  Patient tolerated treatment well    Behavior During Therapy  [facility] for tasks assessed/performed       Past Medical History:  Diagnosis Date  . Anemia    hx Thalassemia minor anemia  . Asthma   . Cancer ([place])    melanoma on back  . Chronic back pain    ESI/uses BC powders  . Depression   . GERD (gastroesophageal reflux disease)   . Headache(784.0)   . Hypertension   . Hypothyroidism   . Peripheral vascular disease (HCC)    legs  . Sepsis ([place]) 01/27/2014  . Shortness of breath   . Tendinitis of both rotator cuffs     Past Surgical History:  Procedure Laterality Date  . ABDOMINAL HYSTERECTOMY     vagina  . ANTERIOR CERVICAL DECOMP/DISCECTOMY FUSION N/A 05/01/2013   Procedure: ANTERIOR CERVICAL DECOMPRESSION/DISCECTOMY FUSION 3 LEVELS;  Surgeon: Elaina Hoops, MD;  Location: [place] [facility];  Service: Neurosurgery;  Laterality: N/A;  ANTERIOR CERVICAL DECOMPRESSION/DISCECTOMY FUSION 3 LEVELS  . APPENDECTOMY    . BLADDER REPAIR     after hysterectomy same day  . EYE SURGERY Bilateral    cataracts  . TONSILLECTOMY    . TUBAL LIGATION      There were no vitals filed for this visit.  Subjective Assessment - 05/07/18 1528    Subjective  Patient arrives late, so session was shortened accordingly. Patient reports she is having some LBP today 5/10. Walking into clinic patient has to  sit down, as she felt like she "was going to lose her balance". Patient denies dizziness/blurred vision/feeling faint, and reports she just has a LOB sometimes. Patient reports minimal compliance with her HEP because she "has been in bed with her back bothering her"Patie.    Pertinent History  Patient is an 84 year old female presenting with LBP and LE weakness. Patient reports having a fall in her yard while chasing after her yard August, that resulted in a L2 compression fx that she sought treatment for in Dec and began getting lumbar epideral injections then. Following first injection patient reports she was doing "very well" then had a second fall, which she reports set her back- that Xrays revealed no further damage other than L2 compression fx and antero and retrolithesis that was already present- and that she has felt "weaker since". Patient reports she has had an injection following that fall (04/11/18), which made her feel better but reports she still has some LBP, and trouble with balance and walking. Patient reports her LBP is more R sided with "restless R leg at nighttime". Patient reports she has had sciatica previously,  but is not experiencing that "kind of pain now". Patient ambulates with rollator in her home and reports she has to use "  scooters" in the store since her two falls (beginning using scooter this year). Patient reports before second fall she did not have to use rollator at home, only in the community. LBP worst pain in past week: 2/10 (reports this is better after epideral 2 weeks ago); best: 0/10. Pt denies N/V, unexplained weight fluctuation, saddle paresthesia, fever, night sweats, or unrelenting night pain at this time.    How long can you sit comfortably?  unlimited    How long can you stand comfortably?  21min    How long can you walk comfortably?  Less than 5 min    Diagnostic tests  MRI; Xray: L2 Lumbar compression fx, grade 1 anterolisthesis L3 over L4, osteoarthritis, DDD,     Patient Stated Goals  Walk with rollator in her community, decrease LBP    Pain Onset  More than a month ago         Ther-Ex -Nustep L1 LE only 5 min for quad recruitment in seated position for safety  -Seated (feet unsupported) weight shifts (with pelvic lift on side opposite side of weight shift 2x 10  -LAQ 3x 10 BW with min cuing for 2sec hold and full knee ext for quad contraction -Education on bone healing and the need for WB positions for this, as well as increasing muscular stability. PT encouraged patient to complete HEP seated and to stand with her rollator as often as possible (with a chair behind her for safety at home if she needs)  Therapeutic Activity  -STS from elevated mat table with patient requiring mod assist from highest mat table elevation. Over multiple trials PT attempted to utilize TC/VC and demonstration to elicit glute activation from patient. With PT assisting, patient demonstrating less leaning back than last session. PT urged patient to attempt standing balance without table support at back of legs following each stand. Patient was unable to keep feet stationary and balance without modA from PT, but was more willing/less fear avoidant than previous sessions.  -Seated balance against perturbations with LE unsupported and without UE support with patient demonstrating good core control with some sway, but no LOB                       PT Education - 05/07/18 1531    Education Details  Exercise form    Person(s) Educated  Patient    Methods  Explanation;Demonstration;Verbal cues;Tactile cues    Comprehension  Verbalized understanding;Returned demonstration;Verbal cues required;Tactile cues required       PT Short Term Goals - 04/26/18 1218      PT SHORT TERM GOAL #1   Title  Pt will be independent with HEP in order to improve strength and balance in order to decrease fall risk and improve function at home and work    Time  4    Period   Weeks    Status  New        PT Long Term Goals - 04/26/18 1238      PT LONG TERM GOAL #1   Title  Pt will improve BERG by at least 3 points in order to demonstrate clinically significant improvement in balance.    Baseline  10/56 (unable to do any standing activities)    Time  8    Period  Weeks    Status  New      PT LONG TERM GOAL #2   Title  Pt will decrease 5TSTS by at least 3 seconds in order  to demonstrate clinically significant improvement in LE strength    Baseline  04/26/18: 45sec w/ UE support on armrests and leg against back of chair    Time  8    Period  Weeks    Status  New      PT LONG TERM GOAL #3   Title  Patient will decrease TUG time to 12sec to demonstrate no fall risk with AD in her home    Baseline  04/26/18 54sec with rollator    Time  16    Period  Weeks    Status  New      PT LONG TERM GOAL #4   Title  Patient will be able to transfer chair to chair safely and independently to increase safety and decrease fall risk at home    Baseline  04/26/18 Patient transfers chair to chair with multiple attempts, max use of UE and CGA for safety    Time  [place] - 05/07/18 1550    Clinical Impression Statement  Patient is continuing to demonstrate severe unsteadiness on feet. Patient does admit that at home she mostly "lays around" and does not do much. PT utilized this opporrtunity to educate patient on the benefits of WB (even walking/standing with her rollator, which she feels comfortable doing) on bone health as well as muscular stability strengthening.  PT continued to encourage HEP compliance as well. Patient demonstrated better standing balance with less posterior pushing and was able to further challenge her limits of stability in standing without table support behind her knees.     Rehab Potential  Fair    Clinical Impairments Affecting Rehab Potential  (-) age, sedentary lifestyle, multiple comorbidities,  chronicity of LB sx (+) social support    PT Frequency  2x / week    PT Duration  8 weeks    PT Treatment/Interventions  ADLs/Self Care Home Management;Aquatic Therapy;Electrical Stimulation;Therapeutic exercise;Taping;Balance training;Gait training;DME Instruction;Neuromuscular re-education;Stair training;Wheelchair mobility training;Passive range of motion;Manual techniques;Patient/family education;Therapeutic activities;Moist Heat;Cryotherapy;Functional mobility training    PT Next Visit Plan  quadraped glute strengthening, LE and core strengthening, seated balance    PT Home Exercise Plan  Seated marching, seated DF, seated clamshell (yellow tband), LAQ    Consulted and Agree with Plan of Care  Patient       Patient will benefit from skilled therapeutic intervention in order to improve the following deficits and impairments:  Pain, Improper body mechanics, Impaired sensation, Increased fascial restricitons, Abnormal gait, Postural dysfunction, Impaired tone, Decreased activity tolerance, Decreased endurance, Decreased range of motion, Decreased strength, Decreased balance, Difficulty walking, Impaired flexibility  Visit Diagnosis: Weakness of both lower extremities  Chronic right-sided low back pain without sciatica     Problem List Patient Active Problem List   Diagnosis Date Noted  . Lower extremity weakness 04/04/2018  . Lower extremity numbness 04/04/2018  . Disorder of skeletal system 01/01/2018  . Pharmacologic therapy 01/01/2018  . Problems influencing health status 01/01/2018  . Cervicalgia 01/01/2018  . Chronic upper back pain 01/01/2018  . Lower extremity weakness (Bilateral) 01/01/2018  . Frequent falls 12/11/2017  . Gait instability 12/11/2017  . COPD (chronic obstructive pulmonary disease) ([place]) 11/16/2017  . Thalassemia 11/16/2017  . Hypertension 11/16/2017  . Hypothyroid 11/16/2017  . Abnormal MRI, lumbar spine (2018) 11/16/2017  . L2 compression fracture  11/16/2017  . H/O cervical spine surgery (  C4-C7 ACDF) 11/16/2017  . Abnormal MRI, cervical spine (2014) 11/16/2017  . Chronic low back pain (Bilateral) (1) (R>L) 11/16/2017  . Lumbar facet joint osteoarthritis (Bilateral) 11/16/2017  . Lumbar facet hypertrophy (Bilateral) 11/16/2017  . Lumbar facet syndrome (Bilateral) 11/16/2017  . Osteoarthritis of lumbar spine 11/16/2017  . Lumbar spondylosis 11/16/2017  . Cervical spondylosis 11/16/2017  . Cervical facet arthropathy (Bilateral) 11/16/2017  . Chronic pain syndrome 11/16/2017  . Osteoarthritis 11/16/2017  . Grade 1 anterolisthesis of L3 over L4 11/16/2017  . Grade 1 retrolisthesis of L5 over S1 11/16/2017  . Lumbar lateral recess stenosis (Bilateral) 11/16/2017  . Chronic lower extremity pain (2) (Bilateral) (R>L) 11/16/2017  . Chronic lower extremity radicular pain (Bilateral) (R>L) 11/16/2017  . Chronic lumbar radiculitis (Right) 11/16/2017  . DDD (degenerative disc disease), lumbar 11/16/2017  . DDD (degenerative disc disease), cervical 11/16/2017  . Hypoxia 02/27/2014  . Anemia, unspecified 02/27/2014  . Urinary tract infection 02/27/2014  . Diastolic CHF, acute ([place]) 02/08/2014  . Acute diastolic heart failure ([place]) 02/08/2014  . Shortness of breath 02/05/2014  . HTN (hypertension) 02/05/2014  . Anemia 02/05/2014  . SOB (shortness of breath) 02/05/2014  . Nonspecific (abnormal) findings on radiological and other examination of gastrointestinal tract 01/31/2014  . Hypotension, unspecified 01/27/2014  . UTI (urinary tract infection) 01/27/2014  . Unspecified hypothyroidism 01/27/2014  . GERD (gastroesophageal reflux disease) 01/27/2014  . Esophageal reflux 01/27/2014  . Increased frequency of urination 02/12/2013  . Other chronic cystitis without hematuria 02/12/2013  . Recurrent UTI 02/12/2013  . Urge incontinence 02/12/2013    Shelton Silvas 05/07/2018, 11:08 PM  [place]  [facility] [address] [place], [address] Phone: [phone]   Fax:  [phone]  Name: Laura Frey MRN: [id number] Date of Birth: 1932-09-22

## 2018-05-09 ENCOUNTER — Encounter: Payer: Medicare Other | Admitting: Physical Therapy

## 2018-05-10 ENCOUNTER — Ambulatory Visit: Payer: Medicare Other

## 2018-05-10 ENCOUNTER — Ambulatory Visit: Payer: Medicare Other | Admitting: Physical Therapy

## 2018-05-14 ENCOUNTER — Encounter: Payer: Self-pay | Admitting: Physical Therapy

## 2018-05-14 ENCOUNTER — Ambulatory Visit: Payer: Medicare Other | Admitting: Physical Therapy

## 2018-05-14 DIAGNOSIS — R29898 Other symptoms and signs involving the musculoskeletal system: Secondary | ICD-10-CM | POA: Diagnosis not present

## 2018-05-14 NOTE — Therapy (Signed)
[facility] [address], [place], [address] Phone: [phone]   Fax:  [phone]  Physical Therapy Treatment  Patient Details  Name: Laura Frey MRN: [id number] Date of Birth: 11-02-1931 Referring Provider: Dr. Consuela Mimes   Encounter Date: 05/14/2018  PT End of Session - 05/14/18 1612    Visit Number  4    Number of Visits  17    Date for PT Re-Evaluation  06/20/18    PT Start Time  0400    PT Stop Time  0455    PT Time Calculation (min)  55 min    Equipment Utilized During Treatment  Gait belt    Activity Tolerance  Patient tolerated treatment well    Behavior During Therapy  [facility] for tasks assessed/performed       Past Medical History:  Diagnosis Date  . Anemia    hx Thalassemia minor anemia  . Asthma   . Cancer ([place])    melanoma on back  . Chronic back pain    ESI/uses BC powders  . Depression   . GERD (gastroesophageal reflux disease)   . Headache(784.0)   . Hypertension   . Hypothyroidism   . Peripheral vascular disease (HCC)    legs  . Sepsis ([place]) 01/27/2014  . Shortness of breath   . Tendinitis of both rotator cuffs     Past Surgical History:  Procedure Laterality Date  . ABDOMINAL HYSTERECTOMY     vagina  . ANTERIOR CERVICAL DECOMP/DISCECTOMY FUSION N/A 05/01/2013   Procedure: ANTERIOR CERVICAL DECOMPRESSION/DISCECTOMY FUSION 3 LEVELS;  Surgeon: Elaina Hoops, MD;  Location: [place] [facility];  Service: Neurosurgery;  Laterality: N/A;  ANTERIOR CERVICAL DECOMPRESSION/DISCECTOMY FUSION 3 LEVELS  . APPENDECTOMY    . BLADDER REPAIR     after hysterectomy same day  . EYE SURGERY Bilateral    cataracts  . TONSILLECTOMY    . TUBAL LIGATION      There were no vitals filed for this visit.  Subjective Assessment - 05/14/18 1608    Subjective  Patient reports she has had an increase in LBP over the past week, but that this may be d/t increased activity. Patient reports some HEP  compliance. Patient has no questions or concerns at this time.     Pertinent History  Patient is an 84 year old female presenting with LBP and LE weakness. Patient reports having a fall in her yard while chasing after her yard August, that resulted in a L2 compression fx that she sought treatment for in Dec and began getting lumbar epideral injections then. Following first injection patient reports she was doing "very well" then had a second fall, which she reports set her back- that Xrays revealed no further damage other than L2 compression fx and antero and retrolithesis that was already present- and that she has felt "weaker since". Patient reports she has had an injection following that fall (04/11/18), which made her feel better but reports she still has some LBP, and trouble with balance and walking. Patient reports her LBP is more R sided with "restless R leg at nighttime". Patient reports she has had sciatica previously,  but is not experiencing that "kind of pain now". Patient ambulates with rollator in her home and reports she has to use "scooters" in the store since her two falls (beginning using scooter this year). Patient reports before second fall she did not have to use rollator at home, only in the community.  LBP worst pain in past week: 2/10 (reports this is better after epideral 2 weeks ago); best: 0/10. Pt denies N/V, unexplained weight fluctuation, saddle paresthesia, fever, night sweats, or unrelenting night pain at this time.    Limitations  Lifting;Standing;Walking;House hold activities    How long can you sit comfortably?  unlimited    How long can you stand comfortably?  46min    How long can you walk comfortably?  Less than 5 min    Diagnostic tests  MRI; Xray: L2 Lumbar compression fx, grade 1 anterolisthesis L3 over L4, osteoarthritis, DDD,    Patient Stated Goals  Walk with rollator in her community, decrease LBP    Pain Onset  More than a month ago          Ther-Ex -Nustep  L2 LE only 5 min for quad recruitment in seated position for safety  -Standing with bilat forearm supported at elevated mat table hip abd 2x 10 with cuing to prevent hip rotation compensation; and (in this position) hip ext 3x 10 with cuing for glute contraction (increased rest breaks between  -Heel raises 3x 10 with TC for proper form initially with good carry over following -LAQ 3x 10 1# ankle wt with min cuing for 2sec hold and full knee ext for quad contraction   Therapeutic Activity  -STS from elevated mat table with patient requiring mod assist from mod mat table elevation. Patient transitioned to HHA to stand throughout trials, but relies heavily on hand hold assist. Patient is unable to remain standing without HHA, but is able to transition to only unilateral HHA. Throughout trials pt is able to complete STS modI, but is not able to remain standing without PT assist -Standing cone reaching activity with unilateral UE support on rollator with continuous cuing for hip hinge to reach outside BOS with chair behind for safety                       PT Education - 05/14/18 1611    Education Details  Exercise form    Person(s) Educated  Patient    Methods  Explanation;Verbal cues;Tactile cues;Demonstration    Comprehension  Verbalized understanding;Verbal cues required;Tactile cues required;Returned demonstration       PT Short Term Goals - 04/26/18 1218      PT SHORT TERM GOAL #1   Title  Pt will be independent with HEP in order to improve strength and balance in order to decrease fall risk and improve function at home and work    Time  4    Period  Weeks    Status  New        PT Long Term Goals - 04/26/18 1238      PT LONG TERM GOAL #1   Title  Pt will improve BERG by at least 3 points in order to demonstrate clinically significant improvement in balance.    Baseline  10/56 (unable to do any standing activities)    Time  8    Period  Weeks    Status  New       PT LONG TERM GOAL #2   Title  Pt will decrease 5TSTS by at least 3 seconds in order to demonstrate clinically significant improvement in LE strength    Baseline  04/26/18: 45sec w/ UE support on armrests and leg against back of chair    Time  8    Period  Weeks    Status  New  PT LONG TERM GOAL #3   Title  Patient will decrease TUG time to 12sec to demonstrate no fall risk with AD in her home    Baseline  04/26/18 54sec with rollator    Time  16    Period  Weeks    Status  New      PT LONG TERM GOAL #4   Title  Patient will be able to transfer chair to chair safely and independently to increase safety and decrease fall risk at home    Baseline  04/26/18 Patient transfers chair to chair with multiple attempts, max use of UE and CGA for safety    Time  8    Period  Weeks    Status  New            Plan - 05/14/18 1647    Clinical Impression Statement  PT continued to progress therex and balance demand with patient in WB positions. Patient continues to need support in WB, but is appearing more confident with PT and is more willing to attempt to stand freely without mat support at back of LE. PT continued to encourage pt to continue WB as able at home to continue provising challenge to LE stabilizers    Rehab Potential  Fair    Clinical Impairments Affecting Rehab Potential  (-) age, sedentary lifestyle, multiple comorbidities, chronicity of LB sx (+) social support    PT Frequency  2x / week    PT Treatment/Interventions  ADLs/Self Care Home Management;Aquatic Therapy;Electrical Stimulation;Therapeutic exercise;Taping;Balance training;Gait training;DME Instruction;Neuromuscular re-education;Stair training;Wheelchair mobility training;Passive range of motion;Manual techniques;Patient/family education;Therapeutic activities;Moist Heat;Cryotherapy;Functional mobility training    PT Next Visit Plan  quadraped glute strengthening, LE and core strengthening, seated balance    PT Home  Exercise Plan  Seated marching, seated DF and PF, seated clamshell (yellow tband), LAQ    Consulted and Agree with Plan of Care  Patient       Patient will benefit from skilled therapeutic intervention in order to improve the following deficits and impairments:  Pain, Improper body mechanics, Impaired sensation, Increased fascial restricitons, Abnormal gait, Postural dysfunction, Impaired tone, Decreased activity tolerance, Decreased endurance, Decreased range of motion, Decreased strength, Decreased balance, Difficulty walking, Impaired flexibility  Visit Diagnosis: Weakness of both lower extremities     Problem List Patient Active Problem List   Diagnosis Date Noted  . Lower extremity weakness 04/04/2018  . Lower extremity numbness 04/04/2018  . Disorder of skeletal system 01/01/2018  . Pharmacologic therapy 01/01/2018  . Problems influencing health status 01/01/2018  . Cervicalgia 01/01/2018  . Chronic upper back pain 01/01/2018  . Lower extremity weakness (Bilateral) 01/01/2018  . Frequent falls 12/11/2017  . Gait instability 12/11/2017  . COPD (chronic obstructive pulmonary disease) ([place]) 11/16/2017  . Thalassemia 11/16/2017  . Hypertension 11/16/2017  . Hypothyroid 11/16/2017  . Abnormal MRI, lumbar spine (2018) 11/16/2017  . L2 compression fracture 11/16/2017  . H/O cervical spine surgery (C4-C7 ACDF) 11/16/2017  . Abnormal MRI, cervical spine (2014) 11/16/2017  . Chronic low back pain (Bilateral) (1) (R>L) 11/16/2017  . Lumbar facet joint osteoarthritis (Bilateral) 11/16/2017  . Lumbar facet hypertrophy (Bilateral) 11/16/2017  . Lumbar facet syndrome (Bilateral) 11/16/2017  . Osteoarthritis of lumbar spine 11/16/2017  . Lumbar spondylosis 11/16/2017  . Cervical spondylosis 11/16/2017  . Cervical facet arthropathy (Bilateral) 11/16/2017  . Chronic pain syndrome 11/16/2017  . Osteoarthritis 11/16/2017  . Grade 1 anterolisthesis of L3 over L4 11/16/2017  . Grade 1  retrolisthesis of  L5 over S1 11/16/2017  . Lumbar lateral recess stenosis (Bilateral) 11/16/2017  . Chronic lower extremity pain (2) (Bilateral) (R>L) 11/16/2017  . Chronic lower extremity radicular pain (Bilateral) (R>L) 11/16/2017  . Chronic lumbar radiculitis (Right) 11/16/2017  . DDD (degenerative disc disease), lumbar 11/16/2017  . DDD (degenerative disc disease), cervical 11/16/2017  . Hypoxia 02/27/2014  . Anemia, unspecified 02/27/2014  . Urinary tract infection 02/27/2014  . Diastolic CHF, acute ([place]) 02/08/2014  . Acute diastolic heart failure ([place]) 02/08/2014  . Shortness of breath 02/05/2014  . HTN (hypertension) 02/05/2014  . Anemia 02/05/2014  . SOB (shortness of breath) 02/05/2014  . Nonspecific (abnormal) findings on radiological and other examination of gastrointestinal tract 01/31/2014  . Hypotension, unspecified 01/27/2014  . UTI (urinary tract infection) 01/27/2014  . Unspecified hypothyroidism 01/27/2014  . GERD (gastroesophageal reflux disease) 01/27/2014  . Esophageal reflux 01/27/2014  . Increased frequency of urination 02/12/2013  . Other chronic cystitis without hematuria 02/12/2013  . Recurrent UTI 02/12/2013  . Urge incontinence 02/12/2013   Shelton Silvas PT, DPT Shelton Silvas 05/14/2018, 9:23 PM  [place] [facility] [address] [place], [address] Phone: [phone]   Fax:  [phone]  Name: Laura Frey MRN: [id number] Date of Birth: Mar 06, 1932

## 2018-05-16 ENCOUNTER — Encounter: Payer: Self-pay | Admitting: Physical Therapy

## 2018-05-16 ENCOUNTER — Ambulatory Visit: Payer: Medicare Other | Admitting: Physical Therapy

## 2018-05-16 DIAGNOSIS — R29898 Other symptoms and signs involving the musculoskeletal system: Secondary | ICD-10-CM

## 2018-05-16 NOTE — Therapy (Signed)
[place] [facility] [address], [place], [address] Phone: [phone]   Fax:  [phone]  Physical Therapy Treatment  Patient Details  Name: Laura Frey MRN: [id number] Date of Birth: 1931-12-27 Referring Provider: Dr. Consuela Mimes   Encounter Date: 05/16/2018  PT End of Session - 05/16/18 1310    Visit Number  5    Number of Visits  17    Date for PT Re-Evaluation  06/20/18    PT Start Time  0100    PT Stop Time  0145    PT Time Calculation (min)  45 min    Equipment Utilized During Treatment  Gait belt    Activity Tolerance  Patient tolerated treatment well    Behavior During Therapy  [facility] for tasks assessed/performed       Past Medical History:  Diagnosis Date  . Anemia    hx Thalassemia minor anemia  . Asthma   . Cancer ([place])    melanoma on back  . Chronic back pain    ESI/uses BC powders  . Depression   . GERD (gastroesophageal reflux disease)   . Headache(784.0)   . Hypertension   . Hypothyroidism   . Peripheral vascular disease (HCC)    legs  . Sepsis ([place]) 01/27/2014  . Shortness of breath   . Tendinitis of both rotator cuffs     Past Surgical History:  Procedure Laterality Date  . ABDOMINAL HYSTERECTOMY     vagina  . ANTERIOR CERVICAL DECOMP/DISCECTOMY FUSION N/A 05/01/2013   Procedure: ANTERIOR CERVICAL DECOMPRESSION/DISCECTOMY FUSION 3 LEVELS;  Surgeon: Elaina Hoops, MD;  Location: [place] [facility];  Service: Neurosurgery;  Laterality: N/A;  ANTERIOR CERVICAL DECOMPRESSION/DISCECTOMY FUSION 3 LEVELS  . APPENDECTOMY    . BLADDER REPAIR     after hysterectomy same day  . EYE SURGERY Bilateral    cataracts  . TONSILLECTOMY    . TUBAL LIGATION      There were no vitals filed for this visit.  Subjective Assessment - 05/16/18 1306    Subjective  Patient reports she had an increase in pain this morning, and she "felt like she was going to cry". Patient reports her pain has subsided, and  she is able to participate in PT today. Patient reports she has been able to do her HEP with no questions or concerns.     Pertinent History  Patient is an 84 year old female presenting with LBP and LE weakness. Patient reports having a fall in her yard while chasing after her yard August, that resulted in a L2 compression fx that she sought treatment for in Dec and began getting lumbar epideral injections then. Following first injection patient reports she was doing "very well" then had a second fall, which she reports set her back- that Xrays revealed no further damage other than L2 compression fx and antero and retrolithesis that was already present- and that she has felt "weaker since". Patient reports she has had an injection following that fall (04/11/18), which made her feel better but reports she still has some LBP, and trouble with balance and walking. Patient reports her LBP is more R sided with "restless R leg at nighttime". Patient reports she has had sciatica previously,  but is not experiencing that "kind of pain now". Patient ambulates with rollator in her home and reports she has to use "scooters" in the store since her two falls (beginning using scooter this year). Patient reports before second  fall she did not have to use rollator at home, only in the community. LBP worst pain in past week: 2/10 (reports this is better after epideral 2 weeks ago); best: 0/10. Pt denies N/V, unexplained weight fluctuation, saddle paresthesia, fever, night sweats, or unrelenting night pain at this time.    Limitations  Lifting;Standing;Walking;House hold activities    How long can you sit comfortably?  unlimited    How long can you stand comfortably?  51min    How long can you walk comfortably?  Less than 5 min    Diagnostic tests  MRI; Xray: L2 Lumbar compression fx, grade 1 anterolisthesis L3 over L4, osteoarthritis, DDD,    Patient Stated Goals  Walk with rollator in her community, decrease LBP    Pain  Onset  More than a month ago            Ther-Ex -Nustep L2 LE only 5 min for quad recruitment in seated position for safety   -Standing hip ext at rollator 2x 8 with patient requiring max TC for proper form without lumbar compensation and with glute contraction -Heel raises 3x 10 with min manual resistance from PT and VC for max heel raise possible   Therapeutic Activity  -STS from elevated mat table with patient requiring mod assist from mod mat table elevation. Patient transitioned to HHA to stand throughout trials, but continues to rely heavily on hand hold assist. Patient is standing with max knee ext and wt back on heels causing posterior LOB, patient is able to balance better with slight bilat knee bend and cuing for wight distribution evenly on bilat soles of feet.  -Multiple trials of standing wt shifting with HHA (bilat and transitioning to unilateral HHA)     Gait Training PT assessed patient's gait and cued patient to increase step length to increase hip ext and for step through gait pattern. Patient is able to correct this 50% with PT cuing. PT encouraged patient to continue this step pattern at home to elicit more hip ext.                   PT Education - 05/16/18 1310    Education Details  Exercise form; education on post exercise soreness    Person(s) Educated  Patient    Methods  Explanation;Demonstration;Tactile cues;Verbal cues    Comprehension  Verbalized understanding;Verbal cues required;Returned demonstration;Tactile cues required       PT Short Term Goals - 04/26/18 1218      PT SHORT TERM GOAL #1   Title  Pt will be independent with HEP in order to improve strength and balance in order to decrease fall risk and improve function at home and work    Time  4    Period  Weeks    Status  New        PT Long Term Goals - 04/26/18 1238      PT LONG TERM GOAL #1   Title  Pt will improve BERG by at least 3 points in order to demonstrate  clinically significant improvement in balance.    Baseline  10/56 (unable to do any standing activities)    Time  8    Period  Weeks    Status  New      PT LONG TERM GOAL #2   Title  Pt will decrease 5TSTS by at least 3 seconds in order to demonstrate clinically significant improvement in LE strength    Baseline  04/26/18: 45sec  w/ UE support on armrests and leg against back of chair    Time  8    Period  Weeks    Status  New      PT LONG TERM GOAL #3   Title  Patient will decrease TUG time to 12sec to demonstrate no fall risk with AD in her home    Baseline  04/26/18 54sec with rollator    Time  16    Period  Weeks    Status  New      PT LONG TERM GOAL #4   Title  Patient will be able to transfer chair to chair safely and independently to increase safety and decrease fall risk at home    Baseline  04/26/18 Patient transfers chair to chair with multiple attempts, max use of UE and CGA for safety    Time  8    Period  Weeks    Status  New            Plan - 05/16/18 1343    Clinical Impression Statement  Patient reports increased pain today, possibly d/t lumbar compensation with hip ext last visit. PT cued patient for proper form to prevent this as much as possible, and patient was able to correct following cuing. Patient is making improvements with standing balance and tolerance. PT will continue to challenge LE stability to increase strength/stability to decrease LB stress.     Rehab Potential  Fair    Clinical Impairments Affecting Rehab Potential  (-) age, sedentary lifestyle, multiple comorbidities, chronicity of LB sx (+) social support    PT Frequency  2x / week    PT Duration  8 weeks    PT Treatment/Interventions  ADLs/Self Care Home Management;Aquatic Therapy;Electrical Stimulation;Therapeutic exercise;Taping;Balance training;Gait training;DME Instruction;Neuromuscular re-education;Stair training;Wheelchair mobility training;Passive range of motion;Manual  techniques;Patient/family education;Therapeutic activities;Moist Heat;Cryotherapy;Functional mobility training    PT Next Visit Plan  standing glute strengthening, LE and core strengthening, seated balance    PT Home Exercise Plan  Seated marching, seated DF and PF, seated clamshell (yellow tband), LAQ    Consulted and Agree with Plan of Care  Patient       Patient will benefit from skilled therapeutic intervention in order to improve the following deficits and impairments:  Pain, Improper body mechanics, Impaired sensation, Increased fascial restricitons, Abnormal gait, Postural dysfunction, Impaired tone, Decreased activity tolerance, Decreased endurance, Decreased range of motion, Decreased strength, Decreased balance, Difficulty walking, Impaired flexibility  Visit Diagnosis: Weakness of both lower extremities     Problem List Patient Active Problem List   Diagnosis Date Noted  . Lower extremity weakness 04/04/2018  . Lower extremity numbness 04/04/2018  . Disorder of skeletal system 01/01/2018  . Pharmacologic therapy 01/01/2018  . Problems influencing health status 01/01/2018  . Cervicalgia 01/01/2018  . Chronic upper back pain 01/01/2018  . Lower extremity weakness (Bilateral) 01/01/2018  . Frequent falls 12/11/2017  . Gait instability 12/11/2017  . COPD (chronic obstructive pulmonary disease) ([place]) 11/16/2017  . Thalassemia 11/16/2017  . Hypertension 11/16/2017  . Hypothyroid 11/16/2017  . Abnormal MRI, lumbar spine (2018) 11/16/2017  . L2 compression fracture 11/16/2017  . H/O cervical spine surgery (C4-C7 ACDF) 11/16/2017  . Abnormal MRI, cervical spine (2014) 11/16/2017  . Chronic low back pain (Bilateral) (1) (R>L) 11/16/2017  . Lumbar facet joint osteoarthritis (Bilateral) 11/16/2017  . Lumbar facet hypertrophy (Bilateral) 11/16/2017  . Lumbar facet syndrome (Bilateral) 11/16/2017  . Osteoarthritis of lumbar spine 11/16/2017  . Lumbar spondylosis 11/16/2017  .  Cervical spondylosis 11/16/2017  . Cervical facet arthropathy (Bilateral) 11/16/2017  . Chronic pain syndrome 11/16/2017  . Osteoarthritis 11/16/2017  . Grade 1 anterolisthesis of L3 over L4 11/16/2017  . Grade 1 retrolisthesis of L5 over S1 11/16/2017  . Lumbar lateral recess stenosis (Bilateral) 11/16/2017  . Chronic lower extremity pain (2) (Bilateral) (R>L) 11/16/2017  . Chronic lower extremity radicular pain (Bilateral) (R>L) 11/16/2017  . Chronic lumbar radiculitis (Right) 11/16/2017  . DDD (degenerative disc disease), lumbar 11/16/2017  . DDD (degenerative disc disease), cervical 11/16/2017  . Hypoxia 02/27/2014  . Anemia, unspecified 02/27/2014  . Urinary tract infection 02/27/2014  . Diastolic CHF, acute ([place]) 02/08/2014  . Acute diastolic heart failure ([place]) 02/08/2014  . Shortness of breath 02/05/2014  . HTN (hypertension) 02/05/2014  . Anemia 02/05/2014  . SOB (shortness of breath) 02/05/2014  . Nonspecific (abnormal) findings on radiological and other examination of gastrointestinal tract 01/31/2014  . Hypotension, unspecified 01/27/2014  . UTI (urinary tract infection) 01/27/2014  . Unspecified hypothyroidism 01/27/2014  . GERD (gastroesophageal reflux disease) 01/27/2014  . Esophageal reflux 01/27/2014  . Increased frequency of urination 02/12/2013  . Other chronic cystitis without hematuria 02/12/2013  . Recurrent UTI 02/12/2013  . Urge incontinence 02/12/2013   Shelton Silvas PT, DPT Shelton Silvas 05/16/2018, 1:47 PM  [place] [facility] [address] [place], [address] Phone: [phone]   Fax:  [phone]  Name: Laura Frey MRN: [id number] Date of Birth: April 28, 1932

## 2018-05-17 ENCOUNTER — Encounter: Payer: Medicare Other | Admitting: Physical Therapy

## 2018-05-17 ENCOUNTER — Ambulatory Visit: Payer: Medicare Other

## 2018-05-21 ENCOUNTER — Encounter: Payer: Self-pay | Admitting: Physical Therapy

## 2018-05-21 ENCOUNTER — Ambulatory Visit: Payer: Medicare Other | Attending: Pain Medicine | Admitting: Physical Therapy

## 2018-05-21 DIAGNOSIS — R29898 Other symptoms and signs involving the musculoskeletal system: Secondary | ICD-10-CM | POA: Diagnosis present

## 2018-05-21 NOTE — Therapy (Signed)
[facility] [address] [place], [address] Phone: [phone]   Fax:  [phone]  Physical Therapy Treatment  Patient Details  Name: Laura Frey MRN: [id number] Date of Birth: 06-02-1932 Referring Provider: Dr. Consuela Mimes   Encounter Date: 05/21/2018  PT End of Session - 05/21/18 1455    Visit Number  6    Number of Visits  17    Date for PT Re-Evaluation  06/20/18    PT Start Time  0230    PT Stop Time  0315    PT Time Calculation (min)  45 min    Equipment Utilized During Treatment  Gait belt    Activity Tolerance  Patient tolerated treatment well    Behavior During Therapy  [facility] for tasks assessed/performed       Past Medical History:  Diagnosis Date  . Anemia    hx Thalassemia minor anemia  . Asthma   . Cancer ([place])    melanoma on back  . Chronic back pain    ESI/uses BC powders  . Depression   . GERD (gastroesophageal reflux disease)   . Headache(784.0)   . Hypertension   . Hypothyroidism   . Peripheral vascular disease ([facility])    legs  . Sepsis ([place]) 01/27/2014  . Shortness of breath   . Tendinitis of both rotator cuffs     Past Surgical History:  Procedure Laterality Date  . ABDOMINAL HYSTERECTOMY     vagina  . ANTERIOR CERVICAL DECOMP/DISCECTOMY FUSION N/A 05/01/2013   Procedure: ANTERIOR CERVICAL DECOMPRESSION/DISCECTOMY FUSION 3 LEVELS;  Surgeon: Elaina Hoops, MD;  Location: [place];  Service: Neurosurgery;  Laterality: N/A;  ANTERIOR CERVICAL DECOMPRESSION/DISCECTOMY FUSION 3 LEVELS  . APPENDECTOMY    . BLADDER REPAIR     after hysterectomy same day  . EYE SURGERY Bilateral    cataracts  . TONSILLECTOMY    . TUBAL LIGATION      There were no vitals filed for this visit.  Subjective Assessment - 05/21/18 1440    Subjective  Patient reports over the weekend she did "pretty good". Patient reports minimal pain today. Patient reports compliance with her HEP with no  questions or concerns.     Pertinent History  Patient is an 84 year old female presenting with LBP and LE weakness. Patient reports having a fall in her yard while chasing after her yard August, that resulted in a L2 compression fx that she sought treatment for in Dec and began getting lumbar epideral injections then. Following first injection patient reports she was doing "very well" then had a second fall, which she reports set her back- that Xrays revealed no further damage other than L2 compression fx and antero and retrolithesis that was already present- and that she has felt "weaker since". Patient reports she has had an injection following that fall (04/11/18), which made her feel better but reports she still has some LBP, and trouble with balance and walking. Patient reports her LBP is more R sided with "restless R leg at nighttime". Patient reports she has had sciatica previously,  but is not experiencing that "kind of pain now". Patient ambulates with rollator in her home and reports she has to use "scooters" in the store since her two falls (beginning using scooter this year). Patient reports before second fall she did not have to use rollator at home, only in the community. LBP worst pain in past week: 2/10 (reports this is  better after epideral 2 weeks ago); best: 0/10. Pt denies N/V, unexplained weight fluctuation, saddle paresthesia, fever, night sweats, or unrelenting night pain at this time.    Limitations  Lifting;Standing;Walking;House hold activities    How long can you sit comfortably?  unlimited    How long can you stand comfortably?  57min    How long can you walk comfortably?  Less than 5 min    Diagnostic tests  MRI; Xray: L2 Lumbar compression fx, grade 1 anterolisthesis L3 over L4, osteoarthritis, DDD,    Patient Stated Goals  Walk with rollator in her community, decrease LBP    Pain Onset  More than a month ago          Ther-Ex -Nustep L2LE only 5 min for quad recruitment  in seated position for safety   -Standing hip ext at rollator 3x 10/9/8 with patient requiring minTC for proper form without lumbar compensation and with glute contraction -Standing hip abd at rollator 2x 10/9 with patient requiring min cuing to prevent lateral bending -Heel raises 2x 10 with min manual resistance from PT and VC for max heel raise possible   Therapeutic Activity  -STS from elevated mat table with patient demonstrating some trials of HHA and some of modI with LE support at mat table for safety during transfer.Patient able to remain standing for 2 min over multiple trials with unilateral and occasional bilateral HHA -Multiple trials of standing wt shifting with HHA (bilat and transitioning to unilateral HHA)                          PT Education - 05/21/18 1454    Education Details  Exercise form    Person(s) Educated  Patient    Methods  Explanation;Tactile cues;Verbal cues    Comprehension  Verbalized understanding;Tactile cues required       PT Short Term Goals - 04/26/18 1218      PT SHORT TERM GOAL #1   Title  Pt will be independent with HEP in order to improve strength and balance in order to decrease fall risk and improve function at home and work    Time  4    Period  Weeks    Status  New        PT Long Term Goals - 04/26/18 1238      PT LONG TERM GOAL #1   Title  Pt will improve BERG by at least 3 points in order to demonstrate clinically significant improvement in balance.    Baseline  10/56 (unable to do any standing activities)    Time  8    Period  Weeks    Status  New      PT LONG TERM GOAL #2   Title  Pt will decrease 5TSTS by at least 3 seconds in order to demonstrate clinically significant improvement in LE strength    Baseline  04/26/18: 45sec w/ UE support on armrests and leg against back of chair    Time  8    Period  Weeks    Status  New      PT LONG TERM GOAL #3   Title  Patient will decrease TUG time to  12sec to demonstrate no fall risk with AD in her home    Baseline  04/26/18 54sec with rollator    Time  16    Period  Weeks    Status  New      PT  LONG TERM GOAL #4   Title  Patient will be able to transfer chair to chair safely and independently to increase safety and decrease fall risk at home    Baseline  04/26/18 Patient transfers chair to chair with multiple attempts, max use of UE and CGA for safety    Time  8    Period  Weeks    Status  New            Plan - 05/21/18 1458    Clinical Impression Statement  Patient is improving in her ability to transfer safely and more independently. PT progressed therex and standing tolerance, which patient reported minimal pain with that subsided following rest breaks    Rehab Potential  Fair    Clinical Impairments Affecting Rehab Potential  (-) age, sedentary lifestyle, multiple comorbidities, chronicity of LB sx (+) social support    PT Frequency  2x / week    PT Duration  8 weeks    PT Treatment/Interventions  ADLs/Self Care Home Management;Aquatic Therapy;Electrical Stimulation;Therapeutic exercise;Taping;Balance training;Gait training;DME Instruction;Neuromuscular re-education;Stair training;Wheelchair mobility training;Passive range of motion;Manual techniques;Patient/family education;Therapeutic activities;Moist Heat;Cryotherapy;Functional mobility training    PT Next Visit Plan  standing glute strengthening, LE and core strengthening, seated balance    PT Home Exercise Plan  Seated marching, seated DF and PF, seated clamshell (yellow tband), LAQ    Consulted and Agree with Plan of Care  Patient       Patient will benefit from skilled therapeutic intervention in order to improve the following deficits and impairments:  Pain, Improper body mechanics, Impaired sensation, Increased fascial restricitons, Abnormal gait, Postural dysfunction, Impaired tone, Decreased activity tolerance, Decreased endurance, Decreased range of motion,  Decreased strength, Decreased balance, Difficulty walking, Impaired flexibility  Visit Diagnosis: Weakness of both lower extremities     Problem List Patient Active Problem List   Diagnosis Date Noted  . Lower extremity weakness 04/04/2018  . Lower extremity numbness 04/04/2018  . Disorder of skeletal system 01/01/2018  . Pharmacologic therapy 01/01/2018  . Problems influencing health status 01/01/2018  . Cervicalgia 01/01/2018  . Chronic upper back pain 01/01/2018  . Lower extremity weakness (Bilateral) 01/01/2018  . Frequent falls 12/11/2017  . Gait instability 12/11/2017  . COPD (chronic obstructive pulmonary disease) ([place]) 11/16/2017  . Thalassemia 11/16/2017  . Hypertension 11/16/2017  . Hypothyroid 11/16/2017  . Abnormal MRI, lumbar spine (2018) 11/16/2017  . L2 compression fracture 11/16/2017  . H/O cervical spine surgery (C4-C7 ACDF) 11/16/2017  . Abnormal MRI, cervical spine (2014) 11/16/2017  . Chronic low back pain (Bilateral) (1) (R>L) 11/16/2017  . Lumbar facet joint osteoarthritis (Bilateral) 11/16/2017  . Lumbar facet hypertrophy (Bilateral) 11/16/2017  . Lumbar facet syndrome (Bilateral) 11/16/2017  . Osteoarthritis of lumbar spine 11/16/2017  . Lumbar spondylosis 11/16/2017  . Cervical spondylosis 11/16/2017  . Cervical facet arthropathy (Bilateral) 11/16/2017  . Chronic pain syndrome 11/16/2017  . Osteoarthritis 11/16/2017  . Grade 1 anterolisthesis of L3 over L4 11/16/2017  . Grade 1 retrolisthesis of L5 over S1 11/16/2017  . Lumbar lateral recess stenosis (Bilateral) 11/16/2017  . Chronic lower extremity pain (2) (Bilateral) (R>L) 11/16/2017  . Chronic lower extremity radicular pain (Bilateral) (R>L) 11/16/2017  . Chronic lumbar radiculitis (Right) 11/16/2017  . DDD (degenerative disc disease), lumbar 11/16/2017  . DDD (degenerative disc disease), cervical 11/16/2017  . Hypoxia 02/27/2014  . Anemia, unspecified 02/27/2014  . Urinary tract infection  02/27/2014  . Diastolic CHF, acute ([place]) 02/08/2014  . Acute diastolic heart failure ([place])  02/08/2014  . Shortness of breath 02/05/2014  . HTN (hypertension) 02/05/2014  . Anemia 02/05/2014  . SOB (shortness of breath) 02/05/2014  . Nonspecific (abnormal) findings on radiological and other examination of gastrointestinal tract 01/31/2014  . Hypotension, unspecified 01/27/2014  . UTI (urinary tract infection) 01/27/2014  . Unspecified hypothyroidism 01/27/2014  . GERD (gastroesophageal reflux disease) 01/27/2014  . Esophageal reflux 01/27/2014  . Increased frequency of urination 02/12/2013  . Other chronic cystitis without hematuria 02/12/2013  . Recurrent UTI 02/12/2013  . Urge incontinence 02/12/2013   Shelton Silvas PT, DPT Shelton Silvas 05/21/2018, 3:59 PM  [facility] [address], [place], [address] Phone: [phone]   Fax:  [phone]  Name: Queena Monrreal MRN: [id number] Date of Birth: 1931-12-31

## 2018-05-22 ENCOUNTER — Encounter: Payer: Medicare Other | Admitting: Physical Therapy

## 2018-05-23 ENCOUNTER — Encounter: Payer: Medicare Other | Admitting: Physical Therapy

## 2018-05-23 ENCOUNTER — Ambulatory Visit: Payer: Medicare Other | Admitting: Physical Therapy

## 2018-05-24 ENCOUNTER — Ambulatory Visit: Payer: Medicare Other

## 2018-05-29 ENCOUNTER — Ambulatory Visit: Payer: Medicare Other | Admitting: Physical Therapy

## 2018-05-31 ENCOUNTER — Ambulatory Visit: Payer: Medicare Other | Admitting: Physical Therapy

## 2018-05-31 ENCOUNTER — Ambulatory Visit: Payer: Medicare Other

## 2018-06-05 ENCOUNTER — Ambulatory Visit: Payer: Medicare Other | Admitting: Physical Therapy

## 2018-06-07 ENCOUNTER — Ambulatory Visit: Payer: Medicare Other

## 2018-06-07 ENCOUNTER — Ambulatory Visit: Payer: Medicare Other | Admitting: Physical Therapy

## 2018-06-12 ENCOUNTER — Ambulatory Visit: Payer: Medicare Other | Admitting: Physical Therapy

## 2018-06-14 ENCOUNTER — Ambulatory Visit: Payer: Medicare Other | Admitting: Physical Therapy

## 2018-06-14 ENCOUNTER — Ambulatory Visit: Payer: Medicare Other

## 2018-06-19 ENCOUNTER — Ambulatory Visit: Payer: Medicare Other | Admitting: Physical Therapy

## 2018-06-21 ENCOUNTER — Encounter: Payer: Medicare Other | Admitting: Physical Therapy

## 2018-06-21 ENCOUNTER — Ambulatory Visit: Payer: Medicare Other

## 2018-06-26 ENCOUNTER — Encounter: Payer: Medicare Other | Admitting: Physical Therapy

## 2018-06-27 ENCOUNTER — Telehealth: Payer: Self-pay

## 2018-06-27 ENCOUNTER — Other Ambulatory Visit: Payer: Self-pay | Admitting: Nurse Practitioner

## 2018-06-27 DIAGNOSIS — M5442 Lumbago with sciatica, left side: Secondary | ICD-10-CM

## 2018-06-27 DIAGNOSIS — M5441 Lumbago with sciatica, right side: Secondary | ICD-10-CM

## 2018-06-27 DIAGNOSIS — M79605 Pain in left leg: Secondary | ICD-10-CM

## 2018-06-27 DIAGNOSIS — G8929 Other chronic pain: Secondary | ICD-10-CM

## 2018-06-27 DIAGNOSIS — M79604 Pain in right leg: Secondary | ICD-10-CM

## 2018-06-27 DIAGNOSIS — M4726 Other spondylosis with radiculopathy, lumbar region: Secondary | ICD-10-CM

## 2018-06-27 DIAGNOSIS — M5410 Radiculopathy, site unspecified: Secondary | ICD-10-CM

## 2018-06-27 NOTE — Telephone Encounter (Signed)
Attempted to call patient to get more details about pain. Message left.

## 2018-06-27 NOTE — Telephone Encounter (Signed)
Pain in right lower back, going down right leg, in the entire leg. What procedure is appropriate?

## 2018-06-27 NOTE — Telephone Encounter (Signed)
Do not put her in for a right LESI. Please make sure that they indicate on the schedule that this was a call and for  a PRN procedure thank you

## 2018-06-27 NOTE — Telephone Encounter (Signed)
She would like to have a procedure. Lower back pain. She doesn't want to come in for an eval.

## 2018-06-28 ENCOUNTER — Encounter: Payer: Medicare Other | Admitting: Physical Therapy

## 2018-06-28 ENCOUNTER — Ambulatory Visit: Payer: Medicare Other

## 2018-06-28 NOTE — Telephone Encounter (Signed)
Spoke with Crystal, she meant DO schedule LESI.

## 2018-07-02 ENCOUNTER — Ambulatory Visit: Payer: Medicare Other | Admitting: Pain Medicine

## 2018-07-03 ENCOUNTER — Encounter: Payer: Medicare Other | Admitting: Physical Therapy

## 2018-07-05 ENCOUNTER — Ambulatory Visit: Payer: Medicare Other

## 2018-07-05 ENCOUNTER — Encounter: Payer: Medicare Other | Admitting: Physical Therapy

## 2018-07-10 ENCOUNTER — Ambulatory Visit: Payer: Medicare Other

## 2018-07-10 ENCOUNTER — Encounter: Payer: Medicare Other | Admitting: Physical Therapy

## 2018-07-12 ENCOUNTER — Encounter: Payer: Medicare Other | Admitting: Physical Therapy

## 2018-07-17 ENCOUNTER — Encounter: Payer: Self-pay | Admitting: Pain Medicine

## 2018-07-17 ENCOUNTER — Ambulatory Visit (HOSPITAL_BASED_OUTPATIENT_CLINIC_OR_DEPARTMENT_OTHER): Payer: Medicare Other | Admitting: Pain Medicine

## 2018-07-17 ENCOUNTER — Ambulatory Visit
Admission: RE | Admit: 2018-07-17 | Discharge: 2018-07-17 | Disposition: A | Discharge status: Home / Self Care | Payer: Medicare Other | Source: Ambulatory Visit | Attending: Pain Medicine | Admitting: Pain Medicine

## 2018-07-17 ENCOUNTER — Other Ambulatory Visit: Payer: Self-pay

## 2018-07-17 VITALS — BP 134/64 | HR 79 | Temp 98.0°F | Resp 17 | Ht 62.0 in | Wt 170.0 lb

## 2018-07-17 DIAGNOSIS — M5416 Radiculopathy, lumbar region: Secondary | ICD-10-CM

## 2018-07-17 DIAGNOSIS — M5410 Radiculopathy, site unspecified: Secondary | ICD-10-CM

## 2018-07-17 DIAGNOSIS — R531 Weakness: Secondary | ICD-10-CM | POA: Diagnosis not present

## 2018-07-17 DIAGNOSIS — R296 Repeated falls: Secondary | ICD-10-CM | POA: Insufficient documentation

## 2018-07-17 DIAGNOSIS — M4726 Other spondylosis with radiculopathy, lumbar region: Secondary | ICD-10-CM

## 2018-07-17 DIAGNOSIS — M5442 Lumbago with sciatica, left side: Secondary | ICD-10-CM | POA: Diagnosis not present

## 2018-07-17 DIAGNOSIS — M79604 Pain in right leg: Secondary | ICD-10-CM | POA: Insufficient documentation

## 2018-07-17 DIAGNOSIS — Z885 Allergy status to narcotic agent status: Secondary | ICD-10-CM | POA: Diagnosis not present

## 2018-07-17 DIAGNOSIS — G8929 Other chronic pain: Secondary | ICD-10-CM | POA: Diagnosis not present

## 2018-07-17 DIAGNOSIS — M79605 Pain in left leg: Secondary | ICD-10-CM | POA: Diagnosis not present

## 2018-07-17 DIAGNOSIS — M545 Low back pain: Secondary | ICD-10-CM | POA: Diagnosis present

## 2018-07-17 DIAGNOSIS — Z79899 Other long term (current) drug therapy: Secondary | ICD-10-CM | POA: Diagnosis not present

## 2018-07-17 DIAGNOSIS — M5136 Other intervertebral disc degeneration, lumbar region: Secondary | ICD-10-CM

## 2018-07-17 DIAGNOSIS — M5116 Intervertebral disc disorders with radiculopathy, lumbar region: Secondary | ICD-10-CM | POA: Insufficient documentation

## 2018-07-17 DIAGNOSIS — Z882 Allergy status to sulfonamides status: Secondary | ICD-10-CM | POA: Diagnosis not present

## 2018-07-17 DIAGNOSIS — R29898 Other symptoms and signs involving the musculoskeletal system: Secondary | ICD-10-CM

## 2018-07-17 DIAGNOSIS — M4316 Spondylolisthesis, lumbar region: Secondary | ICD-10-CM | POA: Diagnosis not present

## 2018-07-17 DIAGNOSIS — M4310 Spondylolisthesis, site unspecified: Secondary | ICD-10-CM

## 2018-07-17 DIAGNOSIS — M5441 Lumbago with sciatica, right side: Secondary | ICD-10-CM

## 2018-07-17 DIAGNOSIS — Z886 Allergy status to analgesic agent status: Secondary | ICD-10-CM | POA: Diagnosis not present

## 2018-07-17 DIAGNOSIS — Z881 Allergy status to other antibiotic agents status: Secondary | ICD-10-CM | POA: Diagnosis not present

## 2018-07-17 DIAGNOSIS — M51369 Other intervertebral disc degeneration, lumbar region without mention of lumbar back pain or lower extremity pain: Secondary | ICD-10-CM

## 2018-07-17 MED ORDER — IOPAMIDOL (ISOVUE-M 200) INJECTION 41%
10.0000 mL | Freq: Once | INTRAMUSCULAR | Status: AC
  Administered 2018-07-17: 10 mL via EPIDURAL
  Filled 2018-07-17: qty 10

## 2018-07-17 MED ORDER — TRIAMCINOLONE ACETONIDE 40 MG/ML IJ SUSP
40.0000 mg | Freq: Once | INTRAMUSCULAR | Status: AC
  Administered 2018-07-17: 40 mg
  Filled 2018-07-17: qty 1

## 2018-07-17 MED ORDER — ROPIVACAINE HCL 2 MG/ML IJ SOLN
2.0000 mL | Freq: Once | INTRAMUSCULAR | Status: AC
  Administered 2018-07-17: 2 mL via EPIDURAL
  Filled 2018-07-17: qty 10

## 2018-07-17 MED ORDER — LIDOCAINE HCL 2 % IJ SOLN
20.0000 mL | Freq: Once | INTRAMUSCULAR | Status: AC
  Administered 2018-07-17: 400 mg
  Filled 2018-07-17: qty 40

## 2018-07-17 MED ORDER — SODIUM CHLORIDE 0.9 % IJ SOLN
INTRAMUSCULAR | Status: AC
  Filled 2018-07-17: qty 10

## 2018-07-17 MED ORDER — SODIUM CHLORIDE 0.9% FLUSH
2.0000 mL | Freq: Once | INTRAVENOUS | Status: AC
  Administered 2018-07-17: 2 mL

## 2018-07-17 NOTE — Progress Notes (Signed)
Patient's Name: Laura Frey  MRN: [id number]  Referring Provider: Vevelyn Francois, NP  DOB: [birth year]  PCP: Marinda Elk, MD  DOS: 07/17/2018  Note by: Gaspar Cola, MD  Service setting: Ambulatory outpatient  Specialty: Interventional Pain Management  Patient type: Established  Location: [facility] (AMB) Pain Management Facility  Visit type: Interventional Procedure   Primary Reason for Visit: Interventional Pain Management Treatment. CC: Back Pain (low)  Procedure:          Anesthesia, Analgesia, Anxiolysis:  Type: Therapeutic Inter-Laminar Epidural Steroid Injection  #4  Region: Lumbar Level: L2-3 Level. Laterality: Midline         Type: Local Anesthesia Indication(s): Analgesia         Route: Infiltration ([place]/IM) IV Access: Declined Sedation: Declined  Local Anesthetic: Lidocaine 1-2%  Position: Prone with head of the table was raised to facilitate breathing.   Indications: 1. DDD (degenerative disc disease), lumbar   2. Grade 1 anterolisthesis of L3 over L4   3. Chronic lumbar radiculitis (Right)   4. Chronic low back pain (Primary Area of Pain) (Bilateral) (R>L)   5. Chronic lower extremity pain (Secondary Area of Pain) (Bilateral) (R>L)    Pain Score: Pre-procedure: 6 /10 Post-procedure: 0-No pain/10  Pre-op Assessment:  Laura Frey is a 84 y.o. (year old), female patient, seen today for interventional treatment. She  has a past surgical history that includes Tubal ligation; Appendectomy; Tonsillectomy; Eye surgery (Bilateral); Abdominal hysterectomy; Bladder repair; and Anterior cervical decomp/discectomy fusion (N/A, 05/01/2013). Laura Frey has a current medication list which includes the following prescription(s): albuterol, amlodipine, vitamin d, duloxetine hcl, estrogens (conjugated), fexofenadine, furosemide, guaifenesin, levothyroxine, loperamide, lorazepam, magnesium oxide, metoprolol, multivitamin with minerals, nitrofurantoin (macrocrystal-monohydrate),  omeprazole, potassium chloride sa, solifenacin, tolterodine, tolterodine, vitamin c, vitamin e, duloxetine, and vitamins/minerals. Her primarily concern today is the Back Pain (low)  The patient indicates having attained excellent relief of the pain with her last lumbar epidural steroid injection, unfortunately, she fell while getting out of bed and hit her head and reinjured her lower back.  She is here today to have the injection repeated to see if he can get some better relief and she believes that if he can avoid having any more falls, this treatment may be able to help her.  The patient continues to experience weakness in both lower extremities, which may be the primary cause of her frequent falls.  In order to further investigate this, we will go ahead and order a nerve conduction test.  She does have weakness but this could be secondary to guarding and deconditioning, as well as a combination of those plus a lumbar radiculopathy.  The nerve conduction test will probably help of in sorting this out.  Initial Vital Signs:  Pulse/HCG Rate: 79ECG Heart Rate: 81 Temp: 98 F (36.7 C) Resp: 16 BP: 139/69 SpO2: 95 %  BMI: Estimated body mass index is 31.09 kg/m as calculated from the following:   Height as of this encounter: 5\' 2"  (1.575 m).   Weight as of this encounter: 170 lb (77.1 kg).  Risk Assessment: Allergies: Reviewed. She is allergic to ciprofloxacin; hydrocodone; oxycodone; zoloft [sertraline hcl]; sertraline; sulfa antibiotics; tylenol [acetaminophen]; and ultram [tramadol].  Allergy Precautions: None required Coagulopathies: Reviewed. None identified.  Blood-thinner therapy: None at this time Active Infection(s): Reviewed. None identified. Laura Frey is afebrile  Site Confirmation: Laura Frey was asked to confirm the procedure and laterality before marking the site Procedure checklist: Completed Consent: Before the procedure  and under the influence of no sedative(s), amnesic(s),  or anxiolytics, the patient was informed of the treatment options, risks and possible complications. To fulfill our ethical and legal obligations, as recommended by the American Medical Association's Code of Ethics, I have informed the patient of my clinical impression; the nature and purpose of the treatment or procedure; the risks, benefits, and possible complications of the intervention; the alternatives, including doing nothing; the risk(s) and benefit(s) of the alternative treatment(s) or procedure(s); and the risk(s) and benefit(s) of doing nothing. The patient was provided information about the general risks and possible complications associated with the procedure. These may include, but are not limited to: failure to achieve desired goals, infection, bleeding, organ or nerve damage, allergic reactions, paralysis, and death. In addition, the patient was informed of those risks and complications associated to Spine-related procedures, such as failure to decrease pain; infection (i.e.: Meningitis, epidural or intraspinal abscess); bleeding (i.e.: epidural hematoma, subarachnoid hemorrhage, or any other type of intraspinal or peri-dural bleeding); organ or nerve damage (i.e.: Any type of peripheral nerve, nerve root, or spinal cord injury) with subsequent damage to sensory, motor, and/or autonomic systems, resulting in permanent pain, numbness, and/or weakness of one or several areas of the body; allergic reactions; (i.e.: anaphylactic reaction); and/or death. Furthermore, the patient was informed of those risks and complications associated with the medications. These include, but are not limited to: allergic reactions (i.e.: anaphylactic or anaphylactoid reaction(s)); adrenal axis suppression; blood sugar elevation that in diabetics may result in ketoacidosis or comma; water retention that in patients with history of congestive heart failure may result in shortness of breath, pulmonary edema, and  decompensation with resultant heart failure; weight gain; swelling or edema; medication-induced neural toxicity; particulate matter embolism and blood vessel occlusion with resultant organ, and/or nervous system infarction; and/or aseptic necrosis of one or more joints. Finally, the patient was informed that Medicine is not an exact science; therefore, there is also the possibility of unforeseen or unpredictable risks and/or possible complications that may result in a catastrophic outcome. The patient indicated having understood very clearly. We have given the patient no guarantees and we have made no promises. Enough time was given to the patient to ask questions, all of which were answered to the patient's satisfaction. Ms. Schlauch has indicated that she wanted to continue with the procedure. Attestation: I, the ordering provider, attest that I have discussed with the patient the benefits, risks, side-effects, alternatives, likelihood of achieving goals, and potential problems during recovery for the procedure that I have provided informed consent. Date  Time: 07/17/2018 12:11 PM  Pre-Procedure Preparation:  Monitoring: As per clinic protocol. Respiration, ETCO2, SpO2, BP, heart rate and rhythm monitor placed and checked for adequate function Safety Precautions: Patient was assessed for positional comfort and pressure points before starting the procedure. Time-out: I initiated and conducted the "Time-out" before starting the procedure, as per protocol. The patient was asked to participate by confirming the accuracy of the "Time Out" information. Verification of the correct person, site, and procedure were performed and confirmed by me, the nursing staff, and the patient. "Time-out" conducted as per Joint Commission's Universal Protocol (UP.01.01.01). Time: 1258  Description of Procedure:          Target Area: The interlaminar space, initially targeting the lower laminar border of the superior vertebral  body. Approach: Paramedial approach. Area Prepped: Entire Posterior Lumbar Region Prepping solution: ChloraPrep (2% chlorhexidine gluconate and 70% isopropyl alcohol) Safety Precautions: Aspiration looking for blood return  was conducted prior to all injections. At no point did we inject any substances, as a needle was being advanced. No attempts were made at seeking any paresthesias. Safe injection practices and needle disposal techniques used. Medications properly checked for expiration dates. SDV (single dose vial) medications used. Description of the Procedure: Protocol guidelines were followed. The procedure needle was introduced through the skin, ipsilateral to the reported pain, and advanced to the target area. Bone was contacted and the needle walked caudad, until the lamina was cleared. The epidural space was identified using "loss-of-resistance technique" with 2-3 ml of PF-NaCl (0.9% NSS), in a 5cc LOR glass syringe.  Vitals:   07/17/18 1209 07/17/18 1254 07/17/18 1259 07/17/18 1300  BP: 139/69 134/69 135/67 134/64  Pulse: 79     Resp: 16 (!) 22 18 17   Temp: 98 F (36.7 C)     SpO2: 95% 94% 94% 94%  Weight: 170 lb (77.1 kg)     Height: 5\' 2"  (1.575 m)       Start Time: 1258 hrs. End Time: 1302 hrs.  Materials:  Needle(s) Type: Epidural needle Gauge: 17G Length: 3.5-in Medication(s): Please see orders for medications and dosing details.  Imaging Guidance (Spinal):          Type of Imaging Technique: Fluoroscopy Guidance (Spinal) Indication(s): Assistance in needle guidance and placement for procedures requiring needle placement in or near specific anatomical locations not easily accessible without such assistance. Exposure Time: Please see nurses notes. Contrast: Before injecting any contrast, we confirmed that the patient did not have an allergy to iodine, shellfish, or radiological contrast. Once satisfactory needle placement was completed at the desired level, radiological  contrast was injected. Contrast injected under live fluoroscopy. No contrast complications. See chart for type and volume of contrast used. Fluoroscopic Guidance: I was personally present during the use of fluoroscopy. "Tunnel Vision Technique" used to obtain the best possible view of the target area. Parallax error corrected before commencing the procedure. "Direction-depth-direction" technique used to introduce the needle under continuous pulsed fluoroscopy. Once target was reached, antero-posterior, oblique, and lateral fluoroscopic projection used confirm needle placement in all planes. Images permanently stored in EMR. Interpretation: I personally interpreted the imaging intraoperatively. Adequate needle placement confirmed in multiple planes. Appropriate spread of contrast into desired area was observed. No evidence of afferent or efferent intravascular uptake. No intrathecal or subarachnoid spread observed. Permanent images saved into the patient's record.  Antibiotic Prophylaxis:   Anti-infectives (From admission, onward)   None     Indication(s): None identified  Post-operative Assessment:  Post-procedure Vital Signs:  Pulse/HCG Rate: 7981 Temp: 98 F (36.7 C) Resp: 17 BP: 134/64 SpO2: 94 %  EBL: None  Complications: No immediate post-treatment complications observed by team, or reported by patient.  Note: The patient tolerated the entire procedure well. A repeat set of vitals were taken after the procedure and the patient was kept under observation following institutional policy, for this type of procedure. Post-procedural neurological assessment was performed, showing return to baseline, prior to discharge. The patient was provided with post-procedure discharge instructions, including a section on how to identify potential problems. Should any problems arise concerning this procedure, the patient was given instructions to immediately contact us, at any time, without hesitation. In  any case, we plan to contact the patient by telephone for a follow-up status report regarding this interventional procedure.  Comments:  No additional relevant information.  Plan of Care    Imaging Orders     DG  C-Arm 1-60 Min-No Report  Procedure Orders     Lumbar Epidural Injection  Medications ordered for procedure: Meds ordered this encounter  Medications  . iopamidol (ISOVUE-M) 41 % intrathecal injection 10 mL    Must be Myelogram-compatible. If not available, you may substitute with a water-soluble, non-ionic, hypoallergenic, myelogram-compatible radiological contrast medium.  Marland Kitchen lidocaine (XYLOCAINE) 2 % (with pres) injection 400 mg  . sodium chloride flush (NS) 0.9 % injection 2 mL  . ropivacaine (PF) 2 mg/mL (0.2%) (NAROPIN) injection 2 mL  . triamcinolone acetonide (KENALOG-40) injection 40 mg   Medications administered: We administered iopamidol, lidocaine, sodium chloride flush, ropivacaine (PF) 2 mg/mL (0.2%), and triamcinolone acetonide.  See the medical record for exact dosing, route, and time of administration.  New Prescriptions   No medications on file   Disposition: Discharge home  Discharge Date & Time: 07/17/2018; 1310 hrs.   Physician-requested Follow-up: Return for post-procedure eval (2 wks), w/ Dr. Dossie Arbour.  Future Appointments  Date Time Provider [place]  08/06/2018  2:00 PM Milinda Pointer, MD [facility] None   Primary Care Physician: Marinda Elk, MD Location: [facility] Note by: Gaspar Cola, MD Date: 07/17/2018; Time: 1:25 PM  Disclaimer:  Medicine is not an exact science. The only guarantee in medicine is that nothing is guaranteed. It is important to note that the decision to proceed with this intervention was based on the information collected from the patient. The Data and conclusions were drawn from the patient's questionnaire, the interview, and the physical examination. Because the  information was provided in large part by the patient, it cannot be guaranteed that it has not been purposely or unconsciously manipulated. Every effort has been made to obtain as much relevant data as possible for this evaluation. It is important to note that the conclusions that lead to this procedure are derived in large part from the available data. Always take into account that the treatment will also be dependent on availability of resources and existing treatment guidelines, considered by other Pain Management Practitioners as being common knowledge and practice, at the time of the intervention. For Medico-Legal purposes, it is also important to point out that variation in procedural techniques and pharmacological choices are the acceptable norm. The indications, contraindications, technique, and results of the above procedure should only be interpreted and judged by a Board-Certified Interventional Pain Specialist with extensive familiarity and expertise in the same exact procedure and technique.

## 2018-07-17 NOTE — Progress Notes (Signed)
Safety precautions to be maintained throughout the outpatient stay will include: orient to surroundings, keep bed in low position, maintain call bell within reach at all times, provide assistance with transfer out of bed and ambulation.  

## 2018-07-17 NOTE — Patient Instructions (Signed)

## 2018-07-18 ENCOUNTER — Telehealth: Payer: Self-pay | Admitting: *Deleted

## 2018-07-18 NOTE — Telephone Encounter (Signed)
Attempted to call for post procedure follow-up. Message left. 

## 2018-07-31 ENCOUNTER — Other Ambulatory Visit: Payer: Self-pay | Admitting: Pain Medicine

## 2018-08-01 ENCOUNTER — Ambulatory Visit: Payer: Medicare Other | Admitting: Pain Medicine

## 2018-08-02 NOTE — Progress Notes (Signed)
Patient's Name: Laura Frey  MRN: [id number]  Referring Provider: Marinda Elk, MD  DOB: 1932/09/03  PCP: Marinda Elk, MD  DOS: 08/06/2018  Note by: Gaspar Cola, MD  Service setting: Ambulatory outpatient  Specialty: Interventional Pain Management  Location: [facility] (AMB) Pain Management Facility    Patient type: Established   Primary Reason(s) for Visit: Encounter for post-procedure evaluation of chronic illness with mild to moderate exacerbation CC: Back Pain (lower)  HPI  [name] is a 84 y.o. year old, female patient, who comes today for a post-procedure evaluation. She has Hypotension, unspecified; UTI (urinary tract infection); Unspecified hypothyroidism; GERD (gastroesophageal reflux disease); Nonspecific (abnormal) findings on radiological and other examination of gastrointestinal tract; Shortness of breath; HTN (hypertension); Anemia; SOB (shortness of breath); Diastolic CHF, acute (HCC); COPD (chronic obstructive pulmonary disease) ([place]); Hypoxia; Increased frequency of urination; Other chronic cystitis without hematuria; Recurrent UTI; Thalassemia; Urge incontinence; Anemia, unspecified; Acute diastolic heart failure ([place]); Esophageal reflux; Hypertension; Hypothyroid; Urinary tract infection; Abnormal MRI, lumbar spine (2018); L2 compression fracture; H/O cervical spine surgery (C4-C7 ACDF); Abnormal MRI, cervical spine (2014); Chronic low back pain (Primary Area of Pain) (Bilateral) (R>L); Lumbar facet joint osteoarthritis (Bilateral); Lumbar facet hypertrophy (Bilateral); Lumbar facet syndrome (Bilateral); Osteoarthritis of lumbar spine; Lumbar spondylosis; Cervical spondylosis; Cervical facet arthropathy (Bilateral); Chronic pain syndrome; Osteoarthritis; Grade 1 anterolisthesis of L3 over L4; Grade 1 retrolisthesis of L5 over S1; Lumbar lateral recess stenosis (Bilateral); Chronic lower extremity pain (Secondary Area of Pain) (Bilateral) (R>L); Chronic lower  extremity radicular pain (Bilateral) (R>L); Chronic lumbar radiculitis (Right); DDD (degenerative disc disease), lumbar; DDD (degenerative disc disease), cervical; Frequent falls; Gait instability; Disorder of skeletal system; Pharmacologic therapy; Problems influencing health status; Cervicalgia; Chronic upper back pain; Lower extremity weakness (Bilateral); Lower extremity weakness; Lower extremity numbness; Complaints of weakness of lower extremity; and Osteopenia of lumbar spine on their problem list. Her primarily concern today is the Back Pain (lower)  Pain Assessment: Location: Lower Back Radiating: occasional numbness in both legs Onset: More than a month ago Duration: Chronic pain Quality: Sharp Severity: 5 /10 (subjective, self-reported pain score)  Note: Reported level is compatible with observation.                         When using our objective Pain Scale, levels between 6 and 10/10 are said to belong in an emergency room, as it progressively worsens from a 6/10, described as severely limiting, requiring emergency care not usually available at an outpatient pain management facility. At a 6/10 level, communication becomes difficult and requires great effort. Assistance to reach the emergency department may be required. Facial flushing and profuse sweating along with potentially dangerous increases in heart rate and blood pressure will be evident. Timing: Intermittent Modifying factors: rest, heat BP: 130/63  HR: (!) 101  [name] comes in today for post-procedure evaluation.  Further details on both, my assessment(s), as well as the proposed treatment plan, please see below.  Post-Procedure Assessment  07/17/2018 Procedure: Palliative (Midline) L2-3 interlaminarLESI#4under fluoroscopic guidance, no sedation Pre-procedure pain score:  6/10 Post-procedure pain score: 0/10 (100% relief) Influential Factors: BMI: 34.57 kg/m Intra-procedural challenges: None observed.          Assessment challenges: None detected.              Reported side-effects: None.        Post-procedural adverse reactions or complications: None reported  Sedation: No sedation used. When no sedatives are used, the analgesic levels obtained are directly associated to the effectiveness of the local anesthetics. However, when sedation is provided, the level of analgesia obtained during the initial 1 hour following the intervention, is believed to be the result of a combination of factors. These factors may include, but are not limited to: 1. The effectiveness of the local anesthetics used. 2. The effects of the analgesic(s) and/or anxiolytic(s) used. 3. The degree of discomfort experienced by the patient at the time of the procedure. 4. The patients ability and reliability in recalling and recording the events. 5. The presence and influence of possible secondary gains and/or psychosocial factors. Reported result: Relief experienced during the 1st hour after the procedure: 100 % (Ultra-Short Term Relief)            Interpretative annotation: Clinically appropriate result. No IV Analgesic or Anxiolytic given, therefore benefits are completely due to Local Anesthetic effects.          Effects of local anesthetic: The analgesic effects attained during this period are directly associated to the localized infiltration of local anesthetics and therefore cary significant diagnostic value as to the etiological location, or anatomical origin, of the pain. Expected duration of relief is directly dependent on the pharmacodynamics of the local anesthetic used. Long-acting (4-6 hours) anesthetics used.  Reported result: Relief during the next 4 to 6 hour after the procedure: 100 % (Short-Term Relief)            Interpretative annotation: Clinically appropriate result. Analgesia during this period is likely to be Local Anesthetic-related.          Long-term benefit: Defined as the period of time past the  expected duration of local anesthetics (1 hour for short-acting and 4-6 hours for long-acting). With the possible exception of prolonged sympathetic blockade from the local anesthetics, benefits during this period are typically attributed to, or associated with, other factors such as analgesic sensory neuropraxia, antiinflammatory effects, or beneficial biochemical changes provided by agents other than the local anesthetics.  Reported result: Extended relief following procedure: 90 %(lasted 2 weeks) (Long-Term Relief)            Interpretative annotation: Clinically possible results. Good relief. No permanent benefit expected. Inflammation plays a part in the etiology to the pain.          Current benefits: Defined as reported results that persistent at this point in time.   Analgesia: 50 %            Function: Somewhat improved ROM: Somewhat improved Interpretative annotation: Ongoing benefit. Limited therapeutic benefit. Results would suggest further treatment needed.          Interpretation: Results would suggest a successful diagnostic intervention.                  Plan:  Because the patient's primary pain is that of the lower back and she has evidence of facet disease, we will be moving onto a diagnostic bilateral lumbar facet block under fluoroscopic guidance and IV sedation.                Laboratory Chemistry  Inflammation Markers (CRP: Acute Phase) (ESR: Chronic Phase) Lab Results  Component Value Date   CRP 1.0 01/01/2018   ESRSEDRATE 12 01/01/2018   LATICACIDVEN 1.3 01/29/2014                         Renal Markers Lab  Results  Component Value Date   BUN 19 01/01/2018   CREATININE 0.98 01/01/2018   BCR 19 01/01/2018   GFRAA 62 01/01/2018   GFRNONAA 54 (L) 01/01/2018                             Hepatic Markers Lab Results  Component Value Date   AST 23 01/01/2018   ALT 13 02/05/2014   ALBUMIN 4.5 01/01/2018                        Neuropathy Markers Lab Results   Component Value Date   VITAMINB12 365 01/01/2018   HGBA1C 6.0 (H) 01/27/2014                        Hematology Parameters Lab Results  Component Value Date   INR 1.31 01/30/2014   LABPROT 16.0 (H) 01/30/2014   APTT 39 (H) 01/29/2014   PLT 382 02/08/2014   HGB 9.1 (L) 02/08/2014   HCT 27.8 (L) 02/08/2014                        CV Markers Lab Results  Component Value Date   CKTOTAL 206 (H) 02/04/2014   CKMB 10.4 (H) 02/04/2014   TROPONINI <0.30 02/05/2014                         Note: Lab results reviewed.  Recent Imaging Results   Results for orders placed in visit on 07/17/18  DG C-Arm 1-60 Min-No Report   Narrative Fluoroscopy was utilized by the requesting physician.  No radiographic  interpretation.    Interpretation Report: Fluoroscopy was used during the procedure to assist with needle guidance. The images were interpreted intraoperatively by the requesting physician.  Meds   Current Outpatient Medications:  .  albuterol (PROVENTIL HFA;VENTOLIN HFA) 108 (90 BASE) MCG/ACT inhaler, Inhale 2 puffs into the lungs every 6 (six) hours as needed for wheezing., Disp: , Rfl:  .  DULoxetine (CYMBALTA) 60 MG capsule, , Disp: , Rfl: 0 .  DULOXETINE HCL PO, Take by mouth daily., Disp: , Rfl:  .  fexofenadine (ALLEGRA) 180 MG tablet, Take 180 mg by mouth daily., Disp: , Rfl:  .  furosemide (LASIX) 20 MG tablet, Take 1 tablet (20 mg total) by mouth daily., Disp: 30 tablet, Rfl: 0 .  guaifenesin (ROBITUSSIN) 100 MG/5ML syrup, Take 200 mg by mouth 3 (three) times daily as needed for cough., Disp: , Rfl:  .  levothyroxine (SYNTHROID, LEVOTHROID) 75 MCG tablet, Take 75 mcg by mouth daily before breakfast., Disp: , Rfl:  .  loperamide (IMODIUM) 2 MG capsule, Take 1 capsule (2 mg total) by mouth 2 (two) times daily as needed for diarrhea or loose stools., Disp: 20 capsule, Rfl: 0 .  LORazepam (ATIVAN) 0.5 MG tablet, Take by mouth., Disp: , Rfl:  .  magnesium oxide (MAG-OX) 400 MG  tablet, Take 400 mg by mouth daily., Disp: , Rfl:  .  metoprolol (TOPROL-XL) 200 MG 24 hr tablet, Take 200 mg by mouth daily., Disp: , Rfl:  .  Multiple Vitamin (MULTIVITAMIN WITH MINERALS) TABS, Take 1 tablet by mouth daily., Disp: , Rfl:  .  nitrofurantoin, macrocrystal-monohydrate, (MACROBID) 100 MG capsule, , Disp: , Rfl:  .  omeprazole (PRILOSEC) 20 MG capsule, Take 20 mg by mouth daily., Disp: , Rfl:  .  potassium chloride SA (K-DUR,KLOR-CON) 20 MEQ tablet, Take 2 tablets (40 mEq total) by mouth 2 (two) times daily with a meal., Disp: 60 tablet, Rfl: 0 .  solifenacin (VESICARE) 5 MG tablet, Take 5 mg by mouth daily., Disp: , Rfl:  .  vitamin C (ASCORBIC ACID) 500 MG tablet, Take 500 mg by mouth daily., Disp: , Rfl:  .  VITAMIN E PO, Take 500 Units by mouth daily., Disp: , Rfl:  .  Vitamins/Minerals TABS, Take by mouth., Disp: , Rfl:  .  amLODipine (NORVASC) 5 MG tablet, Take 2 tablets (10 mg total) by mouth daily. (Patient not taking: Reported on 08/06/2018), Disp: 60 tablet, Rfl: 0 .  Calcium Carbonate-Vit D-Min (GNP CALCIUM 1200) 1200-1000 MG-UNIT CHEW, Chew 1,200 mg by mouth daily with breakfast. Take in combination with vitamin D and magnesium., Disp: 30 tablet, Rfl: 5 .  Cholecalciferol (VITAMIN D3) 5000 units CAPS, Take 1 capsule (5,000 Units total) by mouth daily with breakfast. Take along with calcium and magnesium., Disp: 30 capsule, Rfl: 5 .  Magnesium 500 MG CAPS, Take 1 capsule (500 mg total) by mouth 2 (two) times daily at 8 am and 10 pm., Disp: 60 capsule, Rfl: 5  ROS  Constitutional: Denies any fever or chills Gastrointestinal: No reported hemesis, hematochezia, vomiting, or acute GI distress Musculoskeletal: Denies any acute onset joint swelling, redness, loss of ROM, or weakness Neurological: No reported episodes of acute onset apraxia, aphasia, dysarthria, agnosia, amnesia, paralysis, loss of coordination, or loss of consciousness  Allergies  Ms. Neidhardt is allergic to  ciprofloxacin; hydrocodone; oxycodone; zoloft [sertraline hcl]; sertraline; sulfa antibiotics; tylenol [acetaminophen]; and ultram [tramadol].  [place]  Drug: Ms. [place]  reports that she does not use drugs. Alcohol:  reports that she does not drink alcohol. Tobacco:  reports that she quit smoking about 23 years ago. Her smoking use included cigarettes. She has a 30.00 pack-year smoking history. She has never used smokeless tobacco. Medical:  has a past medical history of Anemia, Asthma, Cancer ([place]), Chronic back pain, Depression, GERD (gastroesophageal reflux disease), Headache(784.0), Hypertension, Hypothyroidism, Peripheral vascular disease ([place]), Sepsis ([place]) (01/27/2014), Shortness of breath, and Tendinitis of both rotator cuffs. Surgical: Ms. Elsayed  has a past surgical history that includes Tubal ligation; Appendectomy; Tonsillectomy; Eye surgery (Bilateral); Abdominal hysterectomy; Bladder repair; and Anterior cervical decomp/discectomy fusion (N/A, 05/01/2013). Family: family history includes Thalassemia in her paternal aunt and son.  Constitutional Exam  General appearance: Well nourished, well developed, and well hydrated. In no apparent acute distress Vitals:   08/06/18 1452  BP: 130/63  Pulse: (!) 101  Resp: 16  Temp: 98.9 F (37.2 C)  TempSrc: Oral  SpO2: 92%  Weight: 177 lb (80.3 kg)  Height: 5' (1.524 m)  HC: 2" (5.1 cm)   BMI Assessment: Estimated body mass index is 34.57 kg/m as calculated from the following:   Height as of this encounter: 5' (1.524 m).   Weight as of this encounter: 177 lb (80.3 kg).  BMI interpretation table: BMI level Category Range association with higher incidence of chronic pain  <18 kg/m2 Underweight   18.5-24.9 kg/m2 Ideal body weight   25-29.9 kg/m2 Overweight Increased incidence by 20%  30-34.9 kg/m2 Obese (Class I) Increased incidence by 68%  35-39.9 kg/m2 Severe obesity (Class II) Increased incidence by 136%  >40 kg/m2 Extreme obesity  (Class III) Increased incidence by 254%   Patient's current BMI Ideal Body weight  Body mass index is 34.57 kg/m. Ideal body weight: 45.5 kg (100  lb 4.9 oz) Adjusted ideal body weight: 59.4 kg (130 lb 15.8 oz)   BMI Readings from Last 4 Encounters:  08/06/18 34.57 kg/m  07/17/18 31.09 kg/m  04/26/18 32.37 kg/m  04/12/18 30.38 kg/m   Wt Readings from Last 4 Encounters:  08/06/18 177 lb (80.3 kg)  07/17/18 170 lb (77.1 kg)  04/26/18 177 lb (80.3 kg)  04/12/18 177 lb (80.3 kg)  Psych/Mental status: Alert, oriented x 3 (person, place, & time)       Eyes: PERLA Respiratory: No evidence of acute respiratory distress  Cervical Spine Area Exam  Skin & Axial Inspection: No masses, redness, edema, swelling, or associated skin lesions Alignment: Symmetrical Functional ROM: Unrestricted ROM      Stability: No instability detected Muscle Tone/Strength: Functionally intact. No obvious neuro-muscular anomalies detected. Sensory (Neurological): Unimpaired Palpation: No palpable anomalies              Upper Extremity (UE) Exam    Side: Right upper extremity  Side: Left upper extremity  Skin & Extremity Inspection: Skin color, temperature, and hair growth are WNL. No peripheral edema or cyanosis. No masses, redness, swelling, asymmetry, or associated skin lesions. No contractures.  Skin & Extremity Inspection: Skin color, temperature, and hair growth are WNL. No peripheral edema or cyanosis. No masses, redness, swelling, asymmetry, or associated skin lesions. No contractures.  Functional ROM: Unrestricted ROM          Functional ROM: Unrestricted ROM          Muscle Tone/Strength: Functionally intact. No obvious neuro-muscular anomalies detected.  Muscle Tone/Strength: Functionally intact. No obvious neuro-muscular anomalies detected.  Sensory (Neurological): Unimpaired          Sensory (Neurological): Unimpaired          Palpation: No palpable anomalies              Palpation: No palpable  anomalies              Provocative Test(s):  Phalen's test: deferred Tinel's test: deferred Apley's scratch test (touch opposite shoulder):  Action 1 (Across chest): deferred Action 2 (Overhead): deferred Action 3 (LB reach): deferred   Provocative Test(s):  Phalen's test: deferred Tinel's test: deferred Apley's scratch test (touch opposite shoulder):  Action 1 (Across chest): deferred Action 2 (Overhead): deferred Action 3 (LB reach): deferred    Thoracic Spine Area Exam  Skin & Axial Inspection: No masses, redness, or swelling Alignment: Symmetrical Functional ROM: Unrestricted ROM Stability: No instability detected Muscle Tone/Strength: Functionally intact. No obvious neuro-muscular anomalies detected. Sensory (Neurological): Unimpaired Muscle strength & Tone: No palpable anomalies  Lumbar Spine Area Exam  Skin & Axial Inspection: No masses, redness, or swelling Alignment: Symmetrical Functional ROM: Unrestricted ROM       Stability: No instability detected Muscle Tone/Strength: Functionally intact. No obvious neuro-muscular anomalies detected. Sensory (Neurological): Unimpaired Palpation: No palpable anomalies       Provocative Tests: Hyperextension/rotation test: deferred today       Lumbar quadrant test (Kemp's test): deferred today       Lateral bending test: deferred today       Patrick's Maneuver: deferred today                   FABER test: deferred today                   S-I anterior distraction/compression test: deferred today         S-I lateral compression test: deferred today  S-I Thigh-thrust test: deferred today         S-I Gaenslen's test: deferred today          Gait & Posture Assessment  Ambulation: Unassisted Gait: Relatively normal for age and body habitus Posture: WNL   Lower Extremity Exam    Side: Right lower extremity  Side: Left lower extremity  Stability: No instability observed          Stability: No instability observed           Skin & Extremity Inspection: Skin color, temperature, and hair growth are WNL. No peripheral edema or cyanosis. No masses, redness, swelling, asymmetry, or associated skin lesions. No contractures.  Skin & Extremity Inspection: Skin color, temperature, and hair growth are WNL. No peripheral edema or cyanosis. No masses, redness, swelling, asymmetry, or associated skin lesions. No contractures.  Functional ROM: Unrestricted ROM                  Functional ROM: Unrestricted ROM                  Muscle Tone/Strength: Functionally intact. No obvious neuro-muscular anomalies detected.  Muscle Tone/Strength: Functionally intact. No obvious neuro-muscular anomalies detected.  Sensory (Neurological): Unimpaired  Sensory (Neurological): Unimpaired  Palpation: No palpable anomalies  Palpation: No palpable anomalies   Assessment  Primary Diagnosis & Pertinent Problem List: The primary encounter diagnosis was Chronic low back pain (Primary Area of Pain) (Bilateral) (R>L). Diagnoses of Chronic lower extremity pain (Secondary Area of Pain) (Bilateral) (R>L), Cervicalgia, Osteopenia of lumbar spine, and Lumbar facet syndrome (Bilateral) were also pertinent to this visit.  Status Diagnosis  Controlled Controlled Controlled 1. Chronic low back pain (Primary Area of Pain) (Bilateral) (R>L)   2. Chronic lower extremity pain (Secondary Area of Pain) (Bilateral) (R>L)   3. Cervicalgia   4. Osteopenia of lumbar spine   5. Lumbar facet syndrome (Bilateral)     Problems updated and reviewed during this visit: No problems updated. Plan of Care  Pharmacotherapy (Medications Ordered): Meds ordered this encounter  Medications  . Cholecalciferol (VITAMIN D3) 5000 units CAPS    Sig: Take 1 capsule (5,000 Units total) by mouth daily with breakfast. Take along with calcium and magnesium.    Dispense:  30 capsule    Refill:  5    Do not place medication on "Automatic Refill".  May substitute with similar  over-the-counter product.  . Magnesium 500 MG CAPS    Sig: Take 1 capsule (500 mg total) by mouth 2 (two) times daily at 8 am and 10 pm.    Dispense:  60 capsule    Refill:  5    Do not place medication on "Automatic Refill".  The patient may use similar over-the-counter product.  . Calcium Carbonate-Vit D-Min (GNP CALCIUM 1200) 1200-1000 MG-UNIT CHEW    Sig: Chew 1,200 mg by mouth daily with breakfast. Take in combination with vitamin D and magnesium.    Dispense:  30 tablet    Refill:  5    Do not place medication on "Automatic Refill".  May substitute with similar over-the-counter product.   Medications administered today: Kamile Fassler. Vankirk had no medications administered during this visit.   Procedure Orders     LUMBAR FACET(MEDIAL BRANCH NERVE BLOCK) [facility] Lab Orders  No laboratory test(s) ordered today   Imaging Orders  No imaging studies ordered today   Referral Orders  No referral(s) requested today   Interventional management options: Planned, scheduled, and/or pending:  Diagnostic bilateral lumbar facet block #1 under fluoroscopic guidance and IV sedation   Considering:   Diagnostic right-sided L3-4 interlaminarLESI#3 Diagnostic bilateral L3-4, L4-5 and/or L5-S1 transforaminal ESI  Diagnostic bilateral L2-3 transforaminal ESI  Diagnostic bilateral lumbar facet block  Possible bilateral lumbar facet RFA  Diagnostic cervical facet block  Possible bilateral cervical facet RFA  Diagnostic midline  Cervical ESI    Palliative PRN treatment(s):   Palliative right-sided L3-4 interlaminarLESI    Provider-requested follow-up: Return for Procedure (w/ sedation): (B) L-FCT BLK #1.  No future appointments. Primary Care Physician: Marinda Elk, MD Location: [facility] Note by: Gaspar Cola, MD Date: 08/06/2018; Time: 4:04 PM

## 2018-08-06 ENCOUNTER — Ambulatory Visit: Payer: Medicare Other | Attending: Pain Medicine | Admitting: Pain Medicine

## 2018-08-06 ENCOUNTER — Encounter: Payer: Self-pay | Admitting: Pain Medicine

## 2018-08-06 ENCOUNTER — Other Ambulatory Visit: Payer: Self-pay

## 2018-08-06 VITALS — BP 130/63 | HR 101 | Temp 98.9°F | Resp 16 | Ht 60.0 in | Wt 177.0 lb

## 2018-08-06 DIAGNOSIS — I110 Hypertensive heart disease with heart failure: Secondary | ICD-10-CM | POA: Insufficient documentation

## 2018-08-06 DIAGNOSIS — D649 Anemia, unspecified: Secondary | ICD-10-CM | POA: Diagnosis not present

## 2018-08-06 DIAGNOSIS — Z886 Allergy status to analgesic agent status: Secondary | ICD-10-CM | POA: Diagnosis not present

## 2018-08-06 DIAGNOSIS — G894 Chronic pain syndrome: Secondary | ICD-10-CM | POA: Insufficient documentation

## 2018-08-06 DIAGNOSIS — M48061 Spinal stenosis, lumbar region without neurogenic claudication: Secondary | ICD-10-CM | POA: Diagnosis not present

## 2018-08-06 DIAGNOSIS — Z885 Allergy status to narcotic agent status: Secondary | ICD-10-CM | POA: Diagnosis not present

## 2018-08-06 DIAGNOSIS — M5441 Lumbago with sciatica, right side: Secondary | ICD-10-CM

## 2018-08-06 DIAGNOSIS — M47896 Other spondylosis, lumbar region: Secondary | ICD-10-CM | POA: Insufficient documentation

## 2018-08-06 DIAGNOSIS — Z79890 Hormone replacement therapy (postmenopausal): Secondary | ICD-10-CM | POA: Insufficient documentation

## 2018-08-06 DIAGNOSIS — M542 Cervicalgia: Secondary | ICD-10-CM

## 2018-08-06 DIAGNOSIS — M5442 Lumbago with sciatica, left side: Secondary | ICD-10-CM | POA: Diagnosis not present

## 2018-08-06 DIAGNOSIS — Z87891 Personal history of nicotine dependence: Secondary | ICD-10-CM | POA: Insufficient documentation

## 2018-08-06 DIAGNOSIS — M47892 Other spondylosis, cervical region: Secondary | ICD-10-CM | POA: Insufficient documentation

## 2018-08-06 DIAGNOSIS — M961 Postlaminectomy syndrome, not elsewhere classified: Secondary | ICD-10-CM | POA: Diagnosis not present

## 2018-08-06 DIAGNOSIS — M79604 Pain in right leg: Secondary | ICD-10-CM | POA: Diagnosis not present

## 2018-08-06 DIAGNOSIS — Z882 Allergy status to sulfonamides status: Secondary | ICD-10-CM | POA: Insufficient documentation

## 2018-08-06 DIAGNOSIS — E039 Hypothyroidism, unspecified: Secondary | ICD-10-CM | POA: Insufficient documentation

## 2018-08-06 DIAGNOSIS — I5032 Chronic diastolic (congestive) heart failure: Secondary | ICD-10-CM | POA: Diagnosis not present

## 2018-08-06 DIAGNOSIS — Z5181 Encounter for therapeutic drug level monitoring: Secondary | ICD-10-CM | POA: Diagnosis not present

## 2018-08-06 DIAGNOSIS — Z888 Allergy status to other drugs, medicaments and biological substances status: Secondary | ICD-10-CM | POA: Diagnosis not present

## 2018-08-06 DIAGNOSIS — M8588 Other specified disorders of bone density and structure, other site: Secondary | ICD-10-CM

## 2018-08-06 DIAGNOSIS — M5030 Other cervical disc degeneration, unspecified cervical region: Secondary | ICD-10-CM | POA: Insufficient documentation

## 2018-08-06 DIAGNOSIS — K219 Gastro-esophageal reflux disease without esophagitis: Secondary | ICD-10-CM | POA: Insufficient documentation

## 2018-08-06 DIAGNOSIS — M79605 Pain in left leg: Secondary | ICD-10-CM | POA: Insufficient documentation

## 2018-08-06 DIAGNOSIS — F329 Major depressive disorder, single episode, unspecified: Secondary | ICD-10-CM | POA: Diagnosis not present

## 2018-08-06 DIAGNOSIS — M5136 Other intervertebral disc degeneration, lumbar region: Secondary | ICD-10-CM | POA: Insufficient documentation

## 2018-08-06 DIAGNOSIS — G8929 Other chronic pain: Secondary | ICD-10-CM

## 2018-08-06 DIAGNOSIS — Z881 Allergy status to other antibiotic agents status: Secondary | ICD-10-CM | POA: Diagnosis not present

## 2018-08-06 DIAGNOSIS — Z79899 Other long term (current) drug therapy: Secondary | ICD-10-CM | POA: Insufficient documentation

## 2018-08-06 DIAGNOSIS — M47816 Spondylosis without myelopathy or radiculopathy, lumbar region: Secondary | ICD-10-CM

## 2018-08-06 MED ORDER — MAGNESIUM 500 MG PO CAPS: 500.0000 mg | ORAL_CAPSULE | Freq: Two times a day (BID) | ORAL | 5 refills | Status: AC

## 2018-08-06 MED ORDER — VITAMIN D3 125 MCG (5000 UT) PO CAPS: 1.0000 | ORAL_CAPSULE | Freq: Every day | ORAL | 5 refills | Status: AC

## 2018-08-06 MED ORDER — GNP CALCIUM 1200 1200-1000 MG-UNIT PO CHEW: 1200.0000 mg | CHEWABLE_TABLET | Freq: Every day | ORAL | 5 refills | Status: DC

## 2018-08-06 NOTE — Progress Notes (Signed)
Safety precautions to be maintained throughout the outpatient stay will include: orient to surroundings, keep bed in low position, maintain call bell within reach at all times, provide assistance with transfer out of bed and ambulation.  

## 2018-08-06 NOTE — Patient Instructions (Addendum)
____________________________________________________________________________________________  Preparing for Procedure with Sedation  Instructions: . Oral Intake: Do not eat or drink anything for at least 8 hours prior to your procedure. . Transportation: Public transportation is not allowed. Bring an adult driver. The driver must be physically present in our waiting room before any procedure can be started. . Physical Assistance: Bring an adult physically capable of assisting you, in the event you need help. This adult should keep you company at home for at least 6 hours after the procedure. . Blood Pressure Medicine: Take your blood pressure medicine with a sip of water the morning of the procedure. . Blood thinners: Notify our staff if you are taking any blood thinners. Depending on which one you take, there will be specific instructions on how and when to stop it. . Diabetics on insulin: Notify the staff so that you can be scheduled 1st case in the morning. If your diabetes requires high dose insulin, take only  of your normal insulin dose the morning of the procedure and notify the staff that you have done so. . Preventing infections: Shower with an antibacterial soap the morning of your procedure. . Build-up your immune system: Take 1000 mg of Vitamin C with every meal (3 times a day) the day prior to your procedure. . Antibiotics: Inform the staff if you have a condition or reason that requires you to take antibiotics before dental procedures. . Pregnancy: If you are pregnant, call and cancel the procedure. . Sickness: If you have a cold, fever, or any active infections, call and cancel the procedure. . Arrival: You must be in the facility at least 30 minutes prior to your scheduled procedure. . Children: Do not bring children with you. . Dress appropriately: Bring dark clothing that you would not mind if they get stained. . Valuables: Do not bring any jewelry or valuables.  Procedure  appointments are reserved for interventional treatments only. . No Prescription Refills. . No medication changes will be discussed during procedure appointments. . No disability issues will be discussed.  Reasons to call and reschedule or cancel your procedure: (Following these recommendations will minimize the risk of a serious complication.) . Surgeries: Avoid having procedures within 2 weeks of any surgery. (Avoid for 2 weeks before or after any surgery). . Flu Shots: Avoid having procedures within 2 weeks of a flu shots or . (Avoid for 2 weeks before or after immunizations). . Barium: Avoid having a procedure within 7-10 days after having had a radiological study involving the use of radiological contrast. (Myelograms, Barium swallow or enema study). . Heart attacks: Avoid any elective procedures or surgeries for the initial 6 months after a "Myocardial Infarction" (Heart Attack). . Blood thinners: It is imperative that you stop these medications before procedures. Let us know if you if you take any blood thinner.  . Infection: Avoid procedures during or within two weeks of an infection (including chest colds or gastrointestinal problems). Symptoms associated with infections include: Localized redness, fever, chills, night sweats or profuse sweating, burning sensation when voiding, cough, congestion, stuffiness, runny nose, sore throat, diarrhea, nausea, vomiting, cold or Flu symptoms, recent or current infections. It is specially important if the infection is over the area that we intend to treat. . Heart and lung problems: Symptoms that may suggest an active cardiopulmonary problem include: cough, chest pain, breathing difficulties or shortness of breath, dizziness, ankle swelling, uncontrolled high or unusually low blood pressure, and/or palpitations. If you are experiencing any of these symptoms, cancel   your procedure and contact your primary care physician for an evaluation.  Remember:   Regular Business hours are:  Monday to Thursday 8:00 AM to 4:00 PM  Provider's Schedule: Milinda Pointer, MD:  Procedure days: Tuesday and Thursday 7:30 AM to 4:00 PM  Gillis Santa, MD:  Procedure days: Monday and Wednesday 7:30 AM to 4:00 PM ____________________________________________________________________________________________   ____________________________________________________________________________________________  Preparing for Procedure with Sedation  Instructions: . Oral Intake: Do not eat or drink anything for at least 8 hours prior to your procedure. . Transportation: Public transportation is not allowed. Bring an adult driver. The driver must be physically present in our waiting room before any procedure can be started. Marland Kitchen Physical Assistance: Bring an adult physically capable of assisting you, in the event you need help. This adult should keep you company at home for at least 6 hours after the procedure. . Blood Pressure Medicine: Take your blood pressure medicine with a sip of water the morning of the procedure. . Blood thinners: Notify our staff if you are taking any blood thinners. Depending on which one you take, there will be specific instructions on how and when to stop it. . Diabetics on insulin: Notify the staff so that you can be scheduled 1st case in the morning. If your diabetes requires high dose insulin, take only  of your normal insulin dose the morning of the procedure and notify the staff that you have done so. . Preventing infections: Shower with an antibacterial soap the morning of your procedure. . Build-up your immune system: Take 1000 mg of Vitamin C with every meal (3 times a day) the day prior to your procedure. Marland Kitchen Antibiotics: Inform the staff if you have a condition or reason that requires you to take antibiotics before dental procedures. . Pregnancy: If you are pregnant, call and cancel the procedure. . Sickness: If you have a cold, fever,  or any active infections, call and cancel the procedure. . Arrival: You must be in the facility at least 30 minutes prior to your scheduled procedure. . Children: Do not bring children with you. . Dress appropriately: Bring dark clothing that you would not mind if they get stained. . Valuables: Do not bring any jewelry or valuables.  Procedure appointments are reserved for interventional treatments only. Marland Kitchen No Prescription Refills. . No medication changes will be discussed during procedure appointments. . No disability issues will be discussed.  Reasons to call and reschedule or cancel your procedure: (Following these recommendations will minimize the risk of a serious complication.) . Surgeries: Avoid having procedures within 2 weeks of any surgery. (Avoid for 2 weeks before or after any surgery). . Flu Shots: Avoid having procedures within 2 weeks of a flu shots or . (Avoid for 2 weeks before or after immunizations). . Barium: Avoid having a procedure within 7-10 days after having had a radiological study involving the use of radiological contrast. (Myelograms, Barium swallow or enema study). . Heart attacks: Avoid any elective procedures or surgeries for the initial 6 months after a "Myocardial Infarction" (Heart Attack). . Blood thinners: It is imperative that you stop these medications before procedures. Let us know if you if you take any blood thinner.  . Infection: Avoid procedures during or within two weeks of an infection (including chest colds or gastrointestinal problems). Symptoms associated with infections include: Localized redness, fever, chills, night sweats or profuse sweating, burning sensation when voiding, cough, congestion, stuffiness, runny nose, sore throat, diarrhea, nausea, vomiting, cold or Flu symptoms, recent  or current infections. It is specially important if the infection is over the area that we intend to treat. Marland Kitchen Heart and lung problems: Symptoms that may suggest an  active cardiopulmonary problem include: cough, chest pain, breathing difficulties or shortness of breath, dizziness, ankle swelling, uncontrolled high or unusually low blood pressure, and/or palpitations. If you are experiencing any of these symptoms, cancel your procedure and contact your primary care physician for an evaluation.  Remember:  Regular Business hours are:  Monday to Thursday 8:00 AM to 4:00 PM  Provider's Schedule: Milinda Pointer, MD:  Procedure days: Tuesday and Thursday 7:30 AM to 4:00 PM  Gillis Santa, MD:  Procedure days: Monday and Wednesday 7:30 AM to 4:00 PM ____________________________________________________________________________________________  ____________________________________________________________________________________________  Preparing for Procedure with Sedation  Instructions: . Oral Intake: Do not eat or drink anything for at least 8 hours prior to your procedure. . Transportation: Public transportation is not allowed. Bring an adult driver. The driver must be physically present in our waiting room before any procedure can be started. Marland Kitchen Physical Assistance: Bring an adult physically capable of assisting you, in the event you need help. This adult should keep you company at home for at least 6 hours after the procedure. . Blood Pressure Medicine: Take your blood pressure medicine with a sip of water the morning of the procedure. . Blood thinners: Notify our staff if you are taking any blood thinners. Depending on which one you take, there will be specific instructions on how and when to stop it. . Diabetics on insulin: Notify the staff so that you can be scheduled 1st case in the morning. If your diabetes requires high dose insulin, take only  of your normal insulin dose the morning of the procedure and notify the staff that you have done so. . Preventing infections: Shower with an antibacterial soap the morning of your procedure. . Build-up your  immune system: Take 1000 mg of Vitamin C with every meal (3 times a day) the day prior to your procedure. Marland Kitchen Antibiotics: Inform the staff if you have a condition or reason that requires you to take antibiotics before dental procedures. . Pregnancy: If you are pregnant, call and cancel the procedure. . Sickness: If you have a cold, fever, or any active infections, call and cancel the procedure. . Arrival: You must be in the facility at least 30 minutes prior to your scheduled procedure. . Children: Do not bring children with you. . Dress appropriately: Bring dark clothing that you would not mind if they get stained. . Valuables: Do not bring any jewelry or valuables.  Procedure appointments are reserved for interventional treatments only. Marland Kitchen No Prescription Refills. . No medication changes will be discussed during procedure appointments. . No disability issues will be discussed.  Reasons to call and reschedule or cancel your procedure: (Following these recommendations will minimize the risk of a serious complication.) . Surgeries: Avoid having procedures within 2 weeks of any surgery. (Avoid for 2 weeks before or after any surgery). . Flu Shots: Avoid having procedures within 2 weeks of a flu shots or . (Avoid for 2 weeks before or after immunizations). . Barium: Avoid having a procedure within 7-10 days after having had a radiological study involving the use of radiological contrast. (Myelograms, Barium swallow or enema study). . Heart attacks: Avoid any elective procedures or surgeries for the initial 6 months after a "Myocardial Infarction" (Heart Attack). . Blood thinners: It is imperative that you stop these medications before procedures. Let  us know if you if you take any blood thinner.  . Infection: Avoid procedures during or within two weeks of an infection (including chest colds or gastrointestinal problems). Symptoms associated with infections include: Localized redness, fever, chills,  night sweats or profuse sweating, burning sensation when voiding, cough, congestion, stuffiness, runny nose, sore throat, diarrhea, nausea, vomiting, cold or Flu symptoms, recent or current infections. It is specially important if the infection is over the area that we intend to treat. Marland Kitchen Heart and lung problems: Symptoms that may suggest an active cardiopulmonary problem include: cough, chest pain, breathing difficulties or shortness of breath, dizziness, ankle swelling, uncontrolled high or unusually low blood pressure, and/or palpitations. If you are experiencing any of these symptoms, cancel your procedure and contact your primary care physician for an evaluation.  Remember:  Regular Business hours are:  Monday to Thursday 8:00 AM to 4:00 PM  Provider's Schedule: Milinda Pointer, MD:  Procedure days: Tuesday and Thursday 7:30 AM to 4:00 PM  Gillis Santa, MD:  Procedure days: Monday and Wednesday 7:30 AM to 4:00 PM ____________________________________________________________________________________________  Facet Blocks Patient Information  Description: The facets are joints in the spine between the vertebrae.  Like any joints in the body, facets can become irritated and painful.  Arthritis can also effect the facets.  By injecting steroids and local anesthetic in and around these joints, we can temporarily block the nerve supply to them.  Steroids act directly on irritated nerves and tissues to reduce selling and inflammation which often leads to decreased pain.  Facet blocks may be done anywhere along the spine from the neck to the low back depending upon the location of your pain.   After numbing the skin with local anesthetic (like Novocaine), a small needle is passed onto the facet joints under x-ray guidance.  You may experience a sensation of pressure while this is being done.  The entire block usually lasts about 15-25 minutes.   Conditions which may be treated by facet  blocks:   Low back/buttock pain  Neck/shoulder pain  Certain types of headaches  Preparation for the injection:  1. Do not eat any solid food or dairy products within 8 hours of your appointment. 2. You may drink clear liquid up to 3 hours before appointment.  Clear liquids include water, black coffee, juice or soda.  No milk or cream please. 3. You may take your regular medication, including pain medications, with a sip of water before your appointment.  Diabetics should hold regular insulin (if taken separately) and take 1/2 normal NPH dose the morning of the procedure.  Carry some sugar containing items with you to your appointment. 4. A driver must accompany you and be prepared to drive you home after your procedure. 5. Bring all your current medications with you. 6. An IV may be inserted and sedation may be given at the discretion of the physician. 7. A blood pressure cuff, EKG and other monitors will often be applied during the procedure.  Some patients may need to have extra oxygen administered for a short period. 8. You will be asked to provide medical information, including your allergies and medications, prior to the procedure.  We must know immediately if you are taking blood thinners (like Coumadin/Warfarin) or if you are allergic to IV iodine contrast (dye).  We must know if you could possible be pregnant.  Possible side-effects:   Bleeding from needle site  Infection (rare, may require surgery)  Nerve injury (rare)  Numbness &  tingling (temporary)  Difficulty urinating (rare, temporary)  Spinal headache (a headache worse with upright posture)  Light-headedness (temporary)  Pain at injection site (serveral days)  Decreased blood pressure (rare, temporary)  Weakness in arm/leg (temporary)  Pressure sensation in back/neck (temporary)   Call if you experience:   Fever/chills associated with headache or increased back/neck pain  Headache worsened by an  upright position  New onset, weakness or numbness of an extremity below the injection site  Hives or difficulty breathing (go to the emergency room)  Inflammation or drainage at the injection site(s)  Severe back/neck pain greater than usual  New symptoms which are concerning to you  Please note:  Although the local anesthetic injected can often make your back or neck feel good for several hours after the injection, the pain will likely return. It takes 3-7 days for steroids to work.  You may not notice any pain relief for at least one week.  If effective, we will often do a series of 2-3 injections spaced 3-6 weeks apart to maximally decrease your pain.  After the initial series, you may be a candidate for a more permanent nerve block of the facets.  If you have any questions, please call #[phone]) [facility]

## 2018-08-09 ENCOUNTER — Other Ambulatory Visit: Payer: Self-pay

## 2018-08-09 ENCOUNTER — Ambulatory Visit
Admission: RE | Admit: 2018-08-09 | Discharge: 2018-08-09 | Disposition: A | Discharge status: Home / Self Care | Payer: Medicare Other | Source: Ambulatory Visit | Attending: Pain Medicine | Admitting: Pain Medicine

## 2018-08-09 ENCOUNTER — Ambulatory Visit (HOSPITAL_BASED_OUTPATIENT_CLINIC_OR_DEPARTMENT_OTHER): Payer: Medicare Other | Admitting: Pain Medicine

## 2018-08-09 ENCOUNTER — Encounter: Payer: Self-pay | Admitting: Pain Medicine

## 2018-08-09 VITALS — BP 144/71 | HR 74 | Temp 99.3°F | Resp 18 | Ht 62.0 in | Wt 177.0 lb

## 2018-08-09 DIAGNOSIS — G8929 Other chronic pain: Secondary | ICD-10-CM | POA: Insufficient documentation

## 2018-08-09 DIAGNOSIS — M5441 Lumbago with sciatica, right side: Secondary | ICD-10-CM

## 2018-08-09 DIAGNOSIS — M47816 Spondylosis without myelopathy or radiculopathy, lumbar region: Secondary | ICD-10-CM | POA: Diagnosis not present

## 2018-08-09 DIAGNOSIS — M5136 Other intervertebral disc degeneration, lumbar region: Secondary | ICD-10-CM | POA: Diagnosis not present

## 2018-08-09 DIAGNOSIS — M5442 Lumbago with sciatica, left side: Secondary | ICD-10-CM

## 2018-08-09 DIAGNOSIS — M4726 Other spondylosis with radiculopathy, lumbar region: Secondary | ICD-10-CM

## 2018-08-09 DIAGNOSIS — Z79899 Other long term (current) drug therapy: Secondary | ICD-10-CM | POA: Insufficient documentation

## 2018-08-09 MED ORDER — ROPIVACAINE HCL 2 MG/ML IJ SOLN
18.0000 mL | Freq: Once | INTRAMUSCULAR | Status: AC
  Administered 2018-08-09: 18 mL via PERINEURAL
  Filled 2018-08-09: qty 20

## 2018-08-09 MED ORDER — TRIAMCINOLONE ACETONIDE 40 MG/ML IJ SUSP
80.0000 mg | Freq: Once | INTRAMUSCULAR | Status: AC
  Administered 2018-08-09: 80 mg
  Filled 2018-08-09: qty 2

## 2018-08-09 MED ORDER — LACTATED RINGERS IV SOLN
1000.0000 mL | Freq: Once | INTRAVENOUS | Status: AC
  Administered 2018-08-09: 1000 mL via INTRAVENOUS

## 2018-08-09 MED ORDER — MIDAZOLAM HCL 5 MG/5ML IJ SOLN
1.0000 mg | INTRAMUSCULAR | Status: DC | PRN
  Administered 2018-08-09: 1 mg via INTRAVENOUS
  Filled 2018-08-09: qty 5

## 2018-08-09 MED ORDER — LIDOCAINE HCL 2 % IJ SOLN
20.0000 mL | Freq: Once | INTRAMUSCULAR | Status: AC
  Administered 2018-08-09: 400 mg
  Filled 2018-08-09: qty 40

## 2018-08-09 MED ORDER — FENTANYL CITRATE (PF) 100 MCG/2ML IJ SOLN
25.0000 ug | INTRAMUSCULAR | Status: DC | PRN
  Administered 2018-08-09: 50 ug via INTRAVENOUS
  Filled 2018-08-09: qty 2

## 2018-08-09 NOTE — Progress Notes (Signed)
Patient's Name: Laura Frey  MRN: [id number]  Referring Provider: Milinda Pointer, MD  DOB: [birth year]  PCP: Marinda Elk, MD  DOS: 08/09/2018  Note by: Gaspar Cola, MD  Service setting: Ambulatory outpatient  Specialty: Interventional Pain Management  Patient type: Established  Location: [facility] (AMB) Pain Management Facility  Visit type: Interventional Procedure   Primary Reason for Visit: Interventional Pain Management Treatment. CC: Back Pain (low)  Procedure:          Anesthesia, Analgesia, Anxiolysis:  Type: Lumbar Facet, Medial Branch Block(s) #1  Primary Purpose: Diagnostic Region: Posterolateral Lumbosacral Spine Level: L2, L3, L4, L5, & S1 Medial Branch Level(s). Injecting these levels blocks the L3-4, L4-5, and L5-S1 lumbar facet joints. Laterality: Bilateral  Type: Moderate (Conscious) Sedation combined with Local Anesthesia Indication(s): Analgesia and Anxiety Route: Intravenous (IV) IV Access: Secured Sedation: Meaningful verbal contact was maintained at all times during the procedure  Local Anesthetic: Lidocaine 1-2%  Position: Prone   Indications: 1. Lumbar facet syndrome (Bilateral)   2. Spondylosis without myelopathy or radiculopathy, lumbar region   3. Lumbar facet joint osteoarthritis (Bilateral)   4. Lumbar facet hypertrophy (Bilateral)   5. DDD (degenerative disc disease), lumbar   6. Lumbar spondylosis   7. Chronic low back pain (Primary Area of Pain) (Bilateral) (R>L)    Pain Score: Pre-procedure: 8 /10 Post-procedure: 0-No pain/10  Pre-op Assessment:  Laura Frey is a 84 y.o. (year old), female patient, seen today for interventional treatment. She  has a past surgical history that includes Tubal ligation; Appendectomy; Tonsillectomy; Eye surgery (Bilateral); Abdominal hysterectomy; Bladder repair; and Anterior cervical decomp/discectomy fusion (N/A, 05/01/2013). Laura Frey has a current medication list which includes the following  prescription(s): albuterol, amlodipine, gnp calcium 1200, vitamin d3, duloxetine, fexofenadine, furosemide, guaifenesin, levothyroxine, loperamide, lorazepam, magnesium, magnesium oxide, metoprolol, multivitamin with minerals, nitrofurantoin (macrocrystal-monohydrate), omeprazole, potassium chloride sa, solifenacin, vitamin c, vitamin e, vitamins/minerals, and duloxetine hcl, and the following Facility-Administered Medications: fentanyl and midazolam. Her primarily concern today is the Back Pain (low)  Initial Vital Signs:  Pulse/HCG Rate: 75ECG Heart Rate: 78 Temp: 98.6 F (37 C) Resp: 16 BP: 131/71 SpO2: 97 %  BMI: Estimated body mass index is 32.37 kg/m as calculated from the following:   Height as of this encounter: 5\' 2"  (1.575 m).   Weight as of this encounter: 177 lb (80.3 kg).  Risk Assessment: Allergies: Reviewed. She is allergic to ciprofloxacin; hydrocodone; oxycodone; zoloft [sertraline hcl]; sertraline; sulfa antibiotics; tylenol [acetaminophen]; and ultram [tramadol].  Allergy Precautions: None required Coagulopathies: Reviewed. None identified.  Blood-thinner therapy: None at this time Active Infection(s): Reviewed. None identified. Laura Frey is afebrile  Site Confirmation: Laura Frey was asked to confirm the procedure and laterality before marking the site Procedure checklist: Completed Consent: Before the procedure and under the influence of no sedative(s), amnesic(s), or anxiolytics, the patient was informed of the treatment options, risks and possible complications. To fulfill our ethical and legal obligations, as recommended by the American Medical Association's Code of Ethics, I have informed the patient of my clinical impression; the nature and purpose of the treatment or procedure; the risks, benefits, and possible complications of the intervention; the alternatives, including doing nothing; the risk(s) and benefit(s) of the alternative treatment(s) or procedure(s); and  the risk(s) and benefit(s) of doing nothing. The patient was provided information about the general risks and possible complications associated with the procedure. These may include, but are not limited to: failure to achieve desired goals, infection, bleeding,  organ or nerve damage, allergic reactions, paralysis, and death. In addition, the patient was informed of those risks and complications associated to Spine-related procedures, such as failure to decrease pain; infection (i.e.: Meningitis, epidural or intraspinal abscess); bleeding (i.e.: epidural hematoma, subarachnoid hemorrhage, or any other type of intraspinal or peri-dural bleeding); organ or nerve damage (i.e.: Any type of peripheral nerve, nerve root, or spinal cord injury) with subsequent damage to sensory, motor, and/or autonomic systems, resulting in permanent pain, numbness, and/or weakness of one or several areas of the body; allergic reactions; (i.e.: anaphylactic reaction); and/or death. Furthermore, the patient was informed of those risks and complications associated with the medications. These include, but are not limited to: allergic reactions (i.e.: anaphylactic or anaphylactoid reaction(s)); adrenal axis suppression; blood sugar elevation that in diabetics may result in ketoacidosis or comma; water retention that in patients with history of congestive heart failure may result in shortness of breath, pulmonary edema, and decompensation with resultant heart failure; weight gain; swelling or edema; medication-induced neural toxicity; particulate matter embolism and blood vessel occlusion with resultant organ, and/or nervous system infarction; and/or aseptic necrosis of one or more joints. Finally, the patient was informed that Medicine is not an exact science; therefore, there is also the possibility of unforeseen or unpredictable risks and/or possible complications that may result in a catastrophic outcome. The patient indicated having  understood very clearly. We have given the patient no guarantees and we have made no promises. Enough time was given to the patient to ask questions, all of which were answered to the patient's satisfaction. Ms. Feeley has indicated that she wanted to continue with the procedure. Attestation: I, the ordering provider, attest that I have discussed with the patient the benefits, risks, side-effects, alternatives, likelihood of achieving goals, and potential problems during recovery for the procedure that I have provided informed consent. Date  Time: 08/09/2018  1:20 PM  Pre-Procedure Preparation:  Monitoring: As per clinic protocol. Respiration, ETCO2, SpO2, BP, heart rate and rhythm monitor placed and checked for adequate function Safety Precautions: Patient was assessed for positional comfort and pressure points before starting the procedure. Time-out: I initiated and conducted the "Time-out" before starting the procedure, as per protocol. The patient was asked to participate by confirming the accuracy of the "Time Out" information. Verification of the correct person, site, and procedure were performed and confirmed by me, the nursing staff, and the patient. "Time-out" conducted as per Joint Commission's Universal Protocol (UP.01.01.01). Time: 1405  Description of Procedure:          Laterality: Bilateral. The procedure was performed in identical fashion on both sides. Levels:  L2, L3, L4, L5, & S1 Medial Branch Level(s) Area Prepped: Posterior Lumbosacral Region Prepping solution: ChloraPrep (2% chlorhexidine gluconate and 70% isopropyl alcohol) Safety Precautions: Aspiration looking for blood return was conducted prior to all injections. At no point did we inject any substances, as a needle was being advanced. Before injecting, the patient was told to immediately notify me if she was experiencing any new onset of "ringing in the ears, or metallic taste in the mouth". No attempts were made at seeking  any paresthesias. Safe injection practices and needle disposal techniques used. Medications properly checked for expiration dates. SDV (single dose vial) medications used. After the completion of the procedure, all disposable equipment used was discarded in the proper designated medical waste containers. Local Anesthesia: Protocol guidelines were followed. The patient was positioned over the fluoroscopy table. The area was prepped in the usual manner.  The time-out was completed. The target area was identified using fluoroscopy. A 12-in long, straight, sterile hemostat was used with fluoroscopic guidance to locate the targets for each level blocked. Once located, the skin was marked with an approved surgical skin marker. Once all sites were marked, the skin (epidermis, dermis, and hypodermis), as well as deeper tissues (fat, connective tissue and muscle) were infiltrated with a small amount of a short-acting local anesthetic, loaded on a 10cc syringe with a 25G, 1.5-in  Needle. An appropriate amount of time was allowed for local anesthetics to take effect before proceeding to the next step. Local Anesthetic: Lidocaine 2.0% The unused portion of the local anesthetic was discarded in the proper designated containers. Technical explanation of process:  L2 Medial Branch Nerve Block (MBB): The target area for the L2 medial branch is at the junction of the postero-lateral aspect of the superior articular process and the superior, posterior, and medial edge of the transverse process of L3. Under fluoroscopic guidance, a Quincke needle was inserted until contact was made with os over the superior postero-lateral aspect of the pedicular shadow (target area). After negative aspiration for blood, 0.5 mL of the nerve block solution was injected without difficulty or complication. The needle was removed intact. L3 Medial Branch Nerve Block (MBB): The target area for the L3 medial branch is at the junction of the  postero-lateral aspect of the superior articular process and the superior, posterior, and medial edge of the transverse process of L4. Under fluoroscopic guidance, a Quincke needle was inserted until contact was made with os over the superior postero-lateral aspect of the pedicular shadow (target area). After negative aspiration for blood, 0.5 mL of the nerve block solution was injected without difficulty or complication. The needle was removed intact. L4 Medial Branch Nerve Block (MBB): The target area for the L4 medial branch is at the junction of the postero-lateral aspect of the superior articular process and the superior, posterior, and medial edge of the transverse process of L5. Under fluoroscopic guidance, a Quincke needle was inserted until contact was made with os over the superior postero-lateral aspect of the pedicular shadow (target area). After negative aspiration for blood, 0.5 mL of the nerve block solution was injected without difficulty or complication. The needle was removed intact. L5 Medial Branch Nerve Block (MBB): The target area for the L5 medial branch is at the junction of the postero-lateral aspect of the superior articular process and the superior, posterior, and medial edge of the sacral ala. Under fluoroscopic guidance, a Quincke needle was inserted until contact was made with os over the superior postero-lateral aspect of the pedicular shadow (target area). After negative aspiration for blood, 0.5 mL of the nerve block solution was injected without difficulty or complication. The needle was removed intact. S1 Medial Branch Nerve Block (MBB): The target area for the S1 medial branch is at the posterior and inferior 6 o'clock position of the L5-S1 facet joint. Under fluoroscopic guidance, the Quincke needle inserted for the L5 MBB was redirected until contact was made with os over the inferior and postero aspect of the sacrum, at the 6 o' clock position under the L5-S1 facet joint  (Target area). After negative aspiration for blood, 0.5 mL of the nerve block solution was injected without difficulty or complication. The needle was removed intact. Procedural Needles: 22-gauge, 3.5-inch, Quincke needles used for all levels. Nerve block solution: 0.2% PF-Ropivacaine + Triamcinolone (40 mg/mL) diluted to a final concentration of 4 mg of Triamcinolone/mL  of Ropivacaine The unused portion of the solution was discarded in the proper designated containers.  Once the entire procedure was completed, the treated area was cleaned, making sure to leave some of the prepping solution back to take advantage of its long term bactericidal properties.   Illustration of the posterior view of the lumbar spine and the posterior neural structures. Laminae of L2 through S1 are labeled. DPRL5, dorsal primary ramus of L5; DPRS1, dorsal primary ramus of S1; DPR3, dorsal primary ramus of L3; FJ, facet (zygapophyseal) joint L3-L4; I, inferior articular process of L4; LB1, lateral branch of dorsal primary ramus of L1; IAB, inferior articular branches from L3 medial branch (supplies L4-L5 facet joint); IBP, intermediate branch plexus; MB3, medial branch of dorsal primary ramus of L3; NR3, third lumbar nerve root; S, superior articular process of L5; SAB, superior articular branches from L4 (supplies L4-5 facet joint also); TP3, transverse process of L3.  Vitals:   08/09/18 1420 08/09/18 1430 08/09/18 1438 08/09/18 1454  BP: 132/88 136/62 (!) 142/69 (!) 144/71  Pulse: 74     Resp: 16 15 19 18   Temp:  99.3 F (37.4 C) 99.3 F (37.4 C)   TempSrc:  Temporal Temporal   SpO2: 99% 92% 92% 95%  Weight:      Height:         Start Time: 1405 hrs. End Time: 1418 hrs.  Imaging Guidance (Spinal):          Type of Imaging Technique: Fluoroscopy Guidance (Spinal) Indication(s): Assistance in needle guidance and placement for procedures requiring needle placement in or near specific anatomical locations not  easily accessible without such assistance. Exposure Time: Please see nurses notes. Contrast: None used. Fluoroscopic Guidance: I was personally present during the use of fluoroscopy. "Tunnel Vision Technique" used to obtain the best possible view of the target area. Parallax error corrected before commencing the procedure. "Direction-depth-direction" technique used to introduce the needle under continuous pulsed fluoroscopy. Once target was reached, antero-posterior, oblique, and lateral fluoroscopic projection used confirm needle placement in all planes. Images permanently stored in EMR. Interpretation: No contrast injected. I personally interpreted the imaging intraoperatively. Adequate needle placement confirmed in multiple planes. Permanent images saved into the patient's record.  Antibiotic Prophylaxis:   Anti-infectives (From admission, onward)   None     Indication(s): None identified  Post-operative Assessment:  Post-procedure Vital Signs:  Pulse/HCG Rate: 7477 Temp: 99.3 F (37.4 C) Resp: 18 BP: (!) 144/71 SpO2: 95 %  EBL: None  Complications: No immediate post-treatment complications observed by team, or reported by patient.  Note: The patient tolerated the entire procedure well. A repeat set of vitals were taken after the procedure and the patient was kept under observation following institutional policy, for this type of procedure. Post-procedural neurological assessment was performed, showing return to baseline, prior to discharge. The patient was provided with post-procedure discharge instructions, including a section on how to identify potential problems. Should any problems arise concerning this procedure, the patient was given instructions to immediately contact us, at any time, without hesitation. In any case, we plan to contact the patient by telephone for a follow-up status report regarding this interventional procedure.  Comments:  No additional relevant  information.  Plan of Care   Imaging Orders     DG C-Arm 1-60 Min-No Report  Procedure Orders     LUMBAR FACET(MEDIAL BRANCH NERVE BLOCK) MBNB  Medications ordered for procedure: Meds ordered this encounter  Medications  . lidocaine (XYLOCAINE) 2 % (  with pres) injection 400 mg  . midazolam (VERSED) 5 MG/5ML injection 1-2 mg    Make sure Flumazenil is available in the pyxis when using this medication. If oversedation occurs, administer 0.2 mg IV over 15 sec. If after 45 sec no response, administer 0.2 mg again over 1 min; may repeat at 1 min intervals; not to exceed 4 doses (1 mg)  . fentaNYL (SUBLIMAZE) injection 25-50 mcg    Make sure Narcan is available in the pyxis when using this medication. In the event of respiratory depression (RR< 8/min): Titrate NARCAN (naloxone) in increments of 0.1 to 0.2 mg IV at 2-3 minute intervals, until desired degree of reversal.  . lactated ringers infusion 1,000 mL  . ropivacaine (PF) 2 mg/mL (0.2%) (NAROPIN) injection 18 mL  . triamcinolone acetonide (KENALOG-40) injection 80 mg   Medications administered: We administered lidocaine, midazolam, fentaNYL, lactated ringers, ropivacaine (PF) 2 mg/mL (0.2%), and triamcinolone acetonide.  See the medical record for exact dosing, route, and time of administration.  Disposition: Discharge home  Discharge Date & Time: 08/09/2018; 1502 hrs.   Physician-requested Follow-up: Return for post-procedure eval (2 wks), w/ Dr. Dossie Arbour.  Future Appointments  Date Time Provider [place]  08/27/2018  1:45 PM Milinda Pointer, MD [facility] None   Primary Care Physician: Marinda Elk, MD Location: [facility] Note by: Gaspar Cola, MD Date: 08/09/2018; Time: 3:08 PM  Disclaimer:  Medicine is not an Chief Strategy Officer. The only guarantee in medicine is that nothing is guaranteed. It is important to note that the decision to proceed with this intervention was  based on the information collected from the patient. The Data and conclusions were drawn from the patient's questionnaire, the interview, and the physical examination. Because the information was provided in large part by the patient, it cannot be guaranteed that it has not been purposely or unconsciously manipulated. Every effort has been made to obtain as much relevant data as possible for this evaluation. It is important to note that the conclusions that lead to this procedure are derived in large part from the available data. Always take into account that the treatment will also be dependent on availability of resources and existing treatment guidelines, considered by other Pain Management Practitioners as being common knowledge and practice, at the time of the intervention. For Medico-Legal purposes, it is also important to point out that variation in procedural techniques and pharmacological choices are the acceptable norm. The indications, contraindications, technique, and results of the above procedure should only be interpreted and judged by a Board-Certified Interventional Pain Specialist with extensive familiarity and expertise in the same exact procedure and technique.

## 2018-08-09 NOTE — Patient Instructions (Signed)

## 2018-08-09 NOTE — Progress Notes (Signed)
Safety precautions to be maintained throughout the outpatient stay will include: orient to surroundings, keep bed in low position, maintain call bell within reach at all times, provide assistance with transfer out of bed and ambulation.  

## 2018-08-10 ENCOUNTER — Telehealth: Payer: Self-pay

## 2018-08-10 NOTE — Telephone Encounter (Signed)
Post procedure phone call.  Mailbox is full and was unable to leave a message.

## 2018-08-26 NOTE — Progress Notes (Signed)
Patient's Name: Laura Frey  MRN: [id number]  Referring Provider: Marinda Elk, MD  DOB: Oct 06, 1932  PCP: Marinda Elk, MD  DOS: 08/27/2018  Note by: Gaspar Cola, MD  Service setting: Ambulatory outpatient  Specialty: Interventional Pain Management  Location: [facility] (AMB) Pain Management Facility    Patient type: Established   Primary Reason(s) for Visit: Encounter for post-procedure evaluation of chronic illness with mild to moderate exacerbation CC: Back Pain (low) and Leg Pain (bilateal)  HPI  Laura Frey is a 84 y.o. year old, female patient, who comes today for a post-procedure evaluation. She has Hypotension, unspecified; UTI (urinary tract infection); Unspecified hypothyroidism; GERD (gastroesophageal reflux disease); Nonspecific (abnormal) findings on radiological and other examination of gastrointestinal tract; Shortness of breath; HTN (hypertension); Anemia; SOB (shortness of breath); Diastolic CHF, acute (HCC); COPD (chronic obstructive pulmonary disease) ([place]); Hypoxia; Increased frequency of urination; Other chronic cystitis without hematuria; Recurrent UTI; Thalassemia; Urge incontinence; Anemia, unspecified; Acute diastolic heart failure ([place]); Esophageal reflux; Hypertension; Hypothyroid; Urinary tract infection; Abnormal MRI, lumbar spine (2018); L2 compression fracture; H/O cervical spine surgery (C4-C7 ACDF); Abnormal MRI, cervical spine (2014); Chronic low back pain (Primary Area of Pain) (Bilateral) (R>L); Lumbar facet joint osteoarthritis (Bilateral); Lumbar facet hypertrophy (Bilateral); Lumbar facet syndrome (Bilateral); Osteoarthritis of lumbar spine; Lumbar spondylosis; Cervical spondylosis; Cervical facet arthropathy (Bilateral); Chronic pain syndrome; Osteoarthritis; Grade 1 anterolisthesis of L3 over L4; Grade 1 retrolisthesis of L5 over S1; Lumbar lateral recess stenosis (Bilateral); Chronic lower extremity pain (Secondary Area of Pain) (Bilateral)  (R>L); Chronic lower extremity radicular pain (Bilateral) (R>L); Chronic lumbar radiculitis (Right); DDD (degenerative disc disease), lumbar; DDD (degenerative disc disease), cervical; Frequent falls; Gait instability; Disorder of skeletal system; Pharmacologic therapy; Problems influencing health status; Cervicalgia; Chronic upper back pain; Lower extremity weakness (Bilateral); Lower extremity weakness; Lower extremity numbness; Complaints of weakness of lower extremity; Osteopenia of lumbar spine; and Spondylosis without myelopathy or radiculopathy, lumbar region on their problem list. Her primarily concern today is the Back Pain (low) and Leg Pain (bilateal)  Pain Assessment: Location: Lower Back Radiating: legs Onset: More than a month ago Duration: Chronic pain Quality: Aching, Constant, Sharp Severity: 6 /10 (subjective, self-reported pain score)  Note: Reported level is compatible with observation.                         When using our objective Pain Scale, levels between 6 and 10/10 are said to belong in an emergency room, as it progressively worsens from a 6/10, described as severely limiting, requiring emergency care not usually available at an outpatient pain management facility. At a 6/10 level, communication becomes difficult and requires great effort. Assistance to reach the emergency department may be required. Facial flushing and profuse sweating along with potentially dangerous increases in heart rate and blood pressure will be evident. Effect on ADL:   Timing: Intermittent Modifying factors: rest BP: 127/66  HR: 82  Laura Frey comes in today for post-procedure evaluation.  Further details on both, my assessment(s), as well as the proposed treatment plan, please see below.  Post-Procedure Assessment  08/09/2018 Procedure: Diagnostic bilateral lumbar facet block #1 under fluoroscopic guidance and IV sedation Pre-procedure pain score:  8/10 Post-procedure pain score: 0/10 (100%  relief) Influential Factors: BMI: 32.37 kg/m Intra-procedural challenges: None observed.         Assessment challenges: None detected.              Reported side-effects: None.  Post-procedural adverse reactions or complications: None reported         Sedation: Sedation provided. When no sedatives are used, the analgesic levels obtained are directly associated to the effectiveness of the local anesthetics. However, when sedation is provided, the level of analgesia obtained during the initial 1 hour following the intervention, is believed to be the result of a combination of factors. These factors may include, but are not limited to: 1. The effectiveness of the local anesthetics used. 2. The effects of the analgesic(s) and/or anxiolytic(s) used. 3. The degree of discomfort experienced by the patient at the time of the procedure. 4. The patients ability and reliability in recalling and recording the events. 5. The presence and influence of possible secondary gains and/or psychosocial factors. Reported result: Relief experienced during the 1st hour after the procedure: 100 % (Ultra-Short Term Relief)            Interpretative annotation: Clinically appropriate result. Analgesia during this period is likely to be Local Anesthetic and/or IV Sedative (Analgesic/Anxiolytic) related.          Effects of local anesthetic: The analgesic effects attained during this period are directly associated to the localized infiltration of local anesthetics and therefore cary significant diagnostic value as to the etiological location, or anatomical origin, of the pain. Expected duration of relief is directly dependent on the pharmacodynamics of the local anesthetic used. Long-acting (4-6 hours) anesthetics used.  Reported result: Relief during the next 4 to 6 hour after the procedure: 100 % (Short-Term Relief)            Interpretative annotation: Clinically appropriate result. Analgesia during this period is  likely to be Local Anesthetic-related.          Long-term benefit: Defined as the period of time past the expected duration of local anesthetics (1 hour for short-acting and 4-6 hours for long-acting). With the possible exception of prolonged sympathetic blockade from the local anesthetics, benefits during this period are typically attributed to, or associated with, other factors such as analgesic sensory neuropraxia, antiinflammatory effects, or beneficial biochemical changes provided by agents other than the local anesthetics.  Reported result: Extended relief following procedure: 100 %(started back 08/26/2018) (Long-Term Relief)            Interpretative annotation: Clinically possible results. Good relief. No permanent benefit expected. Inflammation plays a part in the etiology to the pain.          Current benefits: Defined as reported results that persistent at this point in time.   Analgesia: 75 % Ms. Bronkema reports improvement of axial symptoms. Function: Ms. Mccowan reports improvement in function ROM: Ms. Mentor reports improvement in ROM Interpretative annotation: Ongoing benefit. Therapeutic benefit observed. Results would suggest persistent aggravating factors.          Interpretation: Results would suggest a successful diagnostic intervention.                  Plan:  Proceed with diagnostic procedure No.: 2          Laboratory Chemistry  Inflammation Markers (CRP: Acute Phase) (ESR: Chronic Phase) Lab Results  Component Value Date   CRP 1.0 01/01/2018   ESRSEDRATE 12 01/01/2018   LATICACIDVEN 1.3 01/29/2014                         Rheumatology Markers No results found.  Renal Markers Lab Results  Component Value Date   BUN 19 01/01/2018  CREATININE 0.98 01/01/2018   BCR 19 01/01/2018   GFRAA 62 01/01/2018   GFRNONAA 54 (L) 01/01/2018                             Hepatic Markers Lab Results  Component Value Date   AST 23 01/01/2018   ALT 13 02/05/2014   ALBUMIN  4.5 01/01/2018                        Neuropathy Markers Lab Results  Component Value Date   VITAMINB12 365 01/01/2018   HGBA1C 6.0 (H) 01/27/2014                        Hematology Parameters Lab Results  Component Value Date   INR 1.31 01/30/2014   LABPROT 16.0 (H) 01/30/2014   APTT 39 (H) 01/29/2014   PLT 382 02/08/2014   HGB 9.1 (L) 02/08/2014   HCT 27.8 (L) 02/08/2014                        CV Markers Lab Results  Component Value Date   CKTOTAL 206 (H) 02/04/2014   CKMB 10.4 (H) 02/04/2014   TROPONINI <0.30 02/05/2014                         Note: Lab results reviewed.  Recent Imaging Results   Results for orders placed in visit on 08/09/18  DG C-Arm 1-60 Min-No Report   Narrative Fluoroscopy was utilized by the requesting physician.  No radiographic  interpretation.    Interpretation Report: Fluoroscopy was used during the procedure to assist with needle guidance. The images were interpreted intraoperatively by the requesting physician.  Meds   Current Outpatient Medications:  .  albuterol (PROVENTIL HFA;VENTOLIN HFA) 108 (90 BASE) MCG/ACT inhaler, Inhale 2 puffs into the lungs every 6 (six) hours as needed for wheezing., Disp: , Rfl:  .  amLODipine (NORVASC) 5 MG tablet, Take 2 tablets (10 mg total) by mouth daily., Disp: 60 tablet, Rfl: 0 .  Calcium Carbonate-Vit D-Min (GNP CALCIUM 1200) 1200-1000 MG-UNIT CHEW, Chew 1,200 mg by mouth daily with breakfast. Take in combination with vitamin D and magnesium., Disp: 30 tablet, Rfl: 5 .  Cholecalciferol (VITAMIN D3) 5000 units CAPS, Take 1 capsule (5,000 Units total) by mouth daily with breakfast. Take along with calcium and magnesium., Disp: 30 capsule, Rfl: 5 .  DULoxetine (CYMBALTA) 60 MG capsule, , Disp: , Rfl: 0 .  DULOXETINE HCL PO, Take by mouth daily., Disp: , Rfl:  .  fexofenadine (ALLEGRA) 180 MG tablet, Take 180 mg by mouth daily., Disp: , Rfl:  .  furosemide (LASIX) 20 MG tablet, Take 1 tablet (20 mg  total) by mouth daily., Disp: 30 tablet, Rfl: 0 .  guaifenesin (ROBITUSSIN) 100 MG/5ML syrup, Take 200 mg by mouth 3 (three) times daily as needed for cough., Disp: , Rfl:  .  levothyroxine (SYNTHROID, LEVOTHROID) 75 MCG tablet, Take 75 mcg by mouth daily before breakfast., Disp: , Rfl:  .  loperamide (IMODIUM) 2 MG capsule, Take 1 capsule (2 mg total) by mouth 2 (two) times daily as needed for diarrhea or loose stools., Disp: 20 capsule, Rfl: 0 .  LORazepam (ATIVAN) 0.5 MG tablet, Take by mouth., Disp: , Rfl:  .  Magnesium 500 MG CAPS, Take 1 capsule (500 mg total) by  mouth 2 (two) times daily at 8 am and 10 pm., Disp: 60 capsule, Rfl: 5 .  magnesium oxide (MAG-OX) 400 MG tablet, Take 400 mg by mouth daily., Disp: , Rfl:  .  metoprolol (TOPROL-XL) 200 MG 24 hr tablet, Take 200 mg by mouth daily., Disp: , Rfl:  .  Multiple Vitamin (MULTIVITAMIN WITH MINERALS) TABS, Take 1 tablet by mouth daily., Disp: , Rfl:  .  nitrofurantoin, macrocrystal-monohydrate, (MACROBID) 100 MG capsule, , Disp: , Rfl:  .  omeprazole (PRILOSEC) 20 MG capsule, Take 20 mg by mouth daily., Disp: , Rfl:  .  potassium chloride SA (K-DUR,KLOR-CON) 20 MEQ tablet, Take 2 tablets (40 mEq total) by mouth 2 (two) times daily with a meal., Disp: 60 tablet, Rfl: 0 .  solifenacin (VESICARE) 5 MG tablet, Take 5 mg by mouth daily., Disp: , Rfl:  .  vitamin C (ASCORBIC ACID) 500 MG tablet, Take 500 mg by mouth daily., Disp: , Rfl:  .  VITAMIN E PO, Take 500 Units by mouth daily., Disp: , Rfl:  .  Vitamins/Minerals TABS, Take by mouth., Disp: , Rfl:   ROS  Constitutional: Denies any fever or chills Gastrointestinal: No reported hemesis, hematochezia, vomiting, or acute GI distress Musculoskeletal: Denies any acute onset joint swelling, redness, loss of ROM, or weakness Neurological: No reported episodes of acute onset apraxia, aphasia, dysarthria, agnosia, amnesia, paralysis, loss of coordination, or loss of consciousness  Allergies   Ms. Breidenbach is allergic to ciprofloxacin; hydrocodone; oxycodone; zoloft [sertraline hcl]; sertraline; sulfa antibiotics; tylenol [acetaminophen]; and ultram [tramadol].  [place]  Drug: Ms. Bobby  reports that she does not use drugs. Alcohol:  reports that she does not drink alcohol. Tobacco:  reports that she quit smoking about 23 years ago. Her smoking use included cigarettes. She has a 30.00 pack-year smoking history. She has never used smokeless tobacco. Medical:  has a past medical history of Anemia, Asthma, Cancer ([place]), Chronic back pain, Depression, GERD (gastroesophageal reflux disease), Headache(784.0), Hypertension, Hypothyroidism, Peripheral vascular disease ([place]), Sepsis ([place]) (01/27/2014), Shortness of breath, and Tendinitis of both rotator cuffs. Surgical: Ms. Bachtel  has a past surgical history that includes Tubal ligation; Appendectomy; Tonsillectomy; Eye surgery (Bilateral); Abdominal hysterectomy; Bladder repair; and Anterior cervical decomp/discectomy fusion (N/A, 05/01/2013). Family: family history includes Thalassemia in her paternal aunt and son.  Constitutional Exam  General appearance: Well nourished, well developed, and well hydrated. In no apparent acute distress Vitals:   08/27/18 1407  BP: 127/66  Pulse: 82  Resp: 18  Temp: 98.3 F (36.8 C)  TempSrc: Oral  SpO2: 97%  Weight: 177 lb (80.3 kg)  Height: '5\' 2"'  (1.575 m)   BMI Assessment: Estimated body mass index is 32.37 kg/m as calculated from the following:   Height as of this encounter: '5\' 2"'  (1.575 m).   Weight as of this encounter: 177 lb (80.3 kg).  BMI interpretation table: BMI level Category Range association with higher incidence of chronic pain  <18 kg/m2 Underweight   18.5-24.9 kg/m2 Ideal body weight   25-29.9 kg/m2 Overweight Increased incidence by 20%  30-34.9 kg/m2 Obese (Class I) Increased incidence by 68%  35-39.9 kg/m2 Severe obesity (Class II) Increased incidence by 136%  >40 kg/m2 Extreme  obesity (Class III) Increased incidence by 254%   Patient's current BMI Ideal Body weight  Body mass index is 32.37 kg/m. Ideal body weight: 50.1 kg (110 lb 7.2 oz) Adjusted ideal body weight: 62.2 kg (137 lb 1.1 oz)   BMI Readings from Last  4 Encounters:  08/27/18 32.37 kg/m  08/09/18 32.37 kg/m  08/06/18 34.57 kg/m  07/17/18 31.09 kg/m   Wt Readings from Last 4 Encounters:  08/27/18 177 lb (80.3 kg)  08/09/18 177 lb (80.3 kg)  08/06/18 177 lb (80.3 kg)  07/17/18 170 lb (77.1 kg)  Psych/Mental status: Alert, oriented x 3 (person, place, & time)       Eyes: PERLA Respiratory: No evidence of acute respiratory distress  Cervical Spine Area Exam  Skin & Axial Inspection: No masses, redness, edema, swelling, or associated skin lesions Alignment: Symmetrical Functional ROM: Unrestricted ROM      Stability: No instability detected Muscle Tone/Strength: Functionally intact. No obvious neuro-muscular anomalies detected. Sensory (Neurological): Unimpaired Palpation: No palpable anomalies              Upper Extremity (UE) Exam    Side: Right upper extremity  Side: Left upper extremity  Skin & Extremity Inspection: Skin color, temperature, and hair growth are WNL. No peripheral edema or cyanosis. No masses, redness, swelling, asymmetry, or associated skin lesions. No contractures.  Skin & Extremity Inspection: Skin color, temperature, and hair growth are WNL. No peripheral edema or cyanosis. No masses, redness, swelling, asymmetry, or associated skin lesions. No contractures.  Functional ROM: Unrestricted ROM          Functional ROM: Unrestricted ROM          Muscle Tone/Strength: Functionally intact. No obvious neuro-muscular anomalies detected.  Muscle Tone/Strength: Functionally intact. No obvious neuro-muscular anomalies detected.  Sensory (Neurological): Unimpaired          Sensory (Neurological): Unimpaired          Palpation: No palpable anomalies              Palpation: No  palpable anomalies              Provocative Test(s):  Phalen's test: deferred Tinel's test: deferred Apley's scratch test (touch opposite shoulder):  Action 1 (Across chest): deferred Action 2 (Overhead): deferred Action 3 (LB reach): deferred   Provocative Test(s):  Phalen's test: deferred Tinel's test: deferred Apley's scratch test (touch opposite shoulder):  Action 1 (Across chest): deferred Action 2 (Overhead): deferred Action 3 (LB reach): deferred    Thoracic Spine Area Exam  Skin & Axial Inspection: No masses, redness, or swelling Alignment: Symmetrical Functional ROM: Unrestricted ROM Stability: No instability detected Muscle Tone/Strength: Functionally intact. No obvious neuro-muscular anomalies detected. Sensory (Neurological): Unimpaired Muscle strength & Tone: No palpable anomalies  Lumbar Spine Area Exam  Skin & Axial Inspection: No masses, redness, or swelling Alignment: Symmetrical Functional ROM: Decreased ROM       Stability: No instability detected Muscle Tone/Strength: Functionally intact. No obvious neuro-muscular anomalies detected. Sensory (Neurological): Movement-associated pain Palpation: Complains of area being tender to palpation       Provocative Tests: Hyperextension/rotation test: (+) bilaterally for facet joint pain.  Somewhat improved after first treatment. Lumbar quadrant test (Kemp's test): (+) bilaterally for facet joint pain.  Improved after treatment. Lateral bending test: deferred today       Patrick's Maneuver: deferred today                   FABER test: deferred today                   S-I anterior distraction/compression test: deferred today         S-I lateral compression test: deferred today         S-I  Thigh-thrust test: deferred today         S-I Gaenslen's test: deferred today          Gait & Posture Assessment  Ambulation: Patient came in today in a wheel chair Gait: Significantly limited. Dependent on assistive device to  ambulate Posture: Antalgic   Lower Extremity Exam    Side: Right lower extremity  Side: Left lower extremity  Stability: No instability observed          Stability: No instability observed          Skin & Extremity Inspection: Skin color, temperature, and hair growth are WNL. No peripheral edema or cyanosis. No masses, redness, swelling, asymmetry, or associated skin lesions. No contractures.  Skin & Extremity Inspection: Skin color, temperature, and hair growth are WNL. No peripheral edema or cyanosis. No masses, redness, swelling, asymmetry, or associated skin lesions. No contractures.  Functional ROM: Unrestricted ROM                  Functional ROM: Unrestricted ROM                  Muscle Tone/Strength: Functionally intact. No obvious neuro-muscular anomalies detected.  Muscle Tone/Strength: Functionally intact. No obvious neuro-muscular anomalies detected.  Sensory (Neurological): Unimpaired  Sensory (Neurological): Unimpaired  Palpation: No palpable anomalies  Palpation: No palpable anomalies   Assessment  Primary Diagnosis & Pertinent Problem List: The primary encounter diagnosis was Chronic low back pain (Primary Area of Pain) (Bilateral) (R>L). Diagnoses of Chronic lower extremity pain (Secondary Area of Pain) (Bilateral) (R>L), Spondylosis without myelopathy or radiculopathy, lumbar region, Lumbar facet syndrome (Bilateral), Lumbar facet joint osteoarthritis (Bilateral), Lumbar facet hypertrophy (Bilateral), DDD (degenerative disc disease), lumbar, and L2 compression fracture were also pertinent to this visit.  Status Diagnosis  Controlled Controlled Controlled 1. Chronic low back pain (Primary Area of Pain) (Bilateral) (R>L)   2. Chronic lower extremity pain (Secondary Area of Pain) (Bilateral) (R>L)   3. Spondylosis without myelopathy or radiculopathy, lumbar region   4. Lumbar facet syndrome (Bilateral)   5. Lumbar facet joint osteoarthritis (Bilateral)   6. Lumbar facet  hypertrophy (Bilateral)   7. DDD (degenerative disc disease), lumbar   8. L2 compression fracture     Problems updated and reviewed during this visit: No problems updated. Plan of Care  Pharmacotherapy (Medications Ordered): No orders of the defined types were placed in this encounter.  Medications administered today: Ylianna Almanzar. Mcquitty had no medications administered during this visit.   Procedure Orders     LUMBAR FACET(MEDIAL BRANCH NERVE BLOCK) [facility] Lab Orders  No laboratory test(s) ordered today   Imaging Orders  No imaging studies ordered today   Referral Orders  No referral(s) requested today   Interventional management options: Planned, scheduled, and/or pending:   Diagnostic bilateral lumbar facet block #2 under fluoroscopic guidance and IV sedation   Considering:   Diagnostic right-sided L3-4 interlaminarLESI#3 Diagnostic bilateral L3-4, L4-5 and/or L5-S1 transforaminal ESI Diagnostic bilateral L2-3 transforaminal ESI Diagnostic bilateral lumbar facet block Possible bilateral lumbar facet RFA Diagnostic cervical facet block Possible bilateral cervical facet RFA Diagnostic midlineCervicalESI   Palliative PRN treatment(s):   Palliative right-sided L3-4 interlaminarLESI   Provider-requested follow-up: Return in about 2 weeks (around 09/10/2018) for Procedure (w/ sedation): (B) L-FCT BLK #2.  Future Appointments  Date Time Provider [place]  09/18/2018 12:45 PM Milinda Pointer, MD [facility] None   Primary Care Physician: Marinda Elk, MD Location: [facility] Note  by: Gaspar Cola, MD Date: 08/27/2018; Time: 5:14 PM

## 2018-08-27 ENCOUNTER — Ambulatory Visit: Payer: Medicare Other | Attending: Pain Medicine | Admitting: Pain Medicine

## 2018-08-27 ENCOUNTER — Encounter: Payer: Self-pay | Admitting: Pain Medicine

## 2018-08-27 ENCOUNTER — Other Ambulatory Visit: Payer: Self-pay

## 2018-08-27 VITALS — BP 127/66 | HR 82 | Temp 98.3°F | Resp 18 | Ht 62.0 in | Wt 177.0 lb

## 2018-08-27 DIAGNOSIS — M4316 Spondylolisthesis, lumbar region: Secondary | ICD-10-CM | POA: Diagnosis not present

## 2018-08-27 DIAGNOSIS — Z981 Arthrodesis status: Secondary | ICD-10-CM | POA: Diagnosis not present

## 2018-08-27 DIAGNOSIS — M5442 Lumbago with sciatica, left side: Secondary | ICD-10-CM | POA: Diagnosis not present

## 2018-08-27 DIAGNOSIS — M47816 Spondylosis without myelopathy or radiculopathy, lumbar region: Secondary | ICD-10-CM | POA: Diagnosis not present

## 2018-08-27 DIAGNOSIS — M4856XA Collapsed vertebra, not elsewhere classified, lumbar region, initial encounter for fracture: Secondary | ICD-10-CM | POA: Diagnosis not present

## 2018-08-27 DIAGNOSIS — Z87891 Personal history of nicotine dependence: Secondary | ICD-10-CM | POA: Diagnosis not present

## 2018-08-27 DIAGNOSIS — M5441 Lumbago with sciatica, right side: Secondary | ICD-10-CM

## 2018-08-27 DIAGNOSIS — Z79890 Hormone replacement therapy (postmenopausal): Secondary | ICD-10-CM | POA: Diagnosis not present

## 2018-08-27 DIAGNOSIS — I110 Hypertensive heart disease with heart failure: Secondary | ICD-10-CM | POA: Insufficient documentation

## 2018-08-27 DIAGNOSIS — Z79899 Other long term (current) drug therapy: Secondary | ICD-10-CM | POA: Insufficient documentation

## 2018-08-27 DIAGNOSIS — D649 Anemia, unspecified: Secondary | ICD-10-CM | POA: Insufficient documentation

## 2018-08-27 DIAGNOSIS — G8929 Other chronic pain: Secondary | ICD-10-CM | POA: Diagnosis not present

## 2018-08-27 DIAGNOSIS — M4726 Other spondylosis with radiculopathy, lumbar region: Secondary | ICD-10-CM | POA: Diagnosis not present

## 2018-08-27 DIAGNOSIS — K219 Gastro-esophageal reflux disease without esophagitis: Secondary | ICD-10-CM | POA: Diagnosis not present

## 2018-08-27 DIAGNOSIS — F329 Major depressive disorder, single episode, unspecified: Secondary | ICD-10-CM | POA: Diagnosis not present

## 2018-08-27 DIAGNOSIS — M79604 Pain in right leg: Secondary | ICD-10-CM | POA: Insufficient documentation

## 2018-08-27 DIAGNOSIS — E039 Hypothyroidism, unspecified: Secondary | ICD-10-CM | POA: Diagnosis not present

## 2018-08-27 DIAGNOSIS — M5136 Other intervertebral disc degeneration, lumbar region: Secondary | ICD-10-CM | POA: Diagnosis not present

## 2018-08-27 DIAGNOSIS — M5116 Intervertebral disc disorders with radiculopathy, lumbar region: Secondary | ICD-10-CM | POA: Insufficient documentation

## 2018-08-27 DIAGNOSIS — M545 Low back pain: Secondary | ICD-10-CM | POA: Insufficient documentation

## 2018-08-27 DIAGNOSIS — N3020 Other chronic cystitis without hematuria: Secondary | ICD-10-CM | POA: Insufficient documentation

## 2018-08-27 DIAGNOSIS — M48061 Spinal stenosis, lumbar region without neurogenic claudication: Secondary | ICD-10-CM | POA: Insufficient documentation

## 2018-08-27 DIAGNOSIS — J449 Chronic obstructive pulmonary disease, unspecified: Secondary | ICD-10-CM | POA: Insufficient documentation

## 2018-08-27 DIAGNOSIS — M79605 Pain in left leg: Secondary | ICD-10-CM | POA: Diagnosis present

## 2018-08-27 DIAGNOSIS — I5031 Acute diastolic (congestive) heart failure: Secondary | ICD-10-CM | POA: Diagnosis not present

## 2018-08-27 DIAGNOSIS — M4856XS Collapsed vertebra, not elsewhere classified, lumbar region, sequela of fracture: Secondary | ICD-10-CM

## 2018-08-27 NOTE — Patient Instructions (Addendum)
____________________________________________________________________________________________  Preparing for Procedure with Sedation  Instructions: . Oral Intake: Do not eat or drink anything for at least 8 hours prior to your procedure. . Transportation: Public transportation is not allowed. Bring an adult driver. The driver must be physically present in our waiting room before any procedure can be started. . Physical Assistance: Bring an adult physically capable of assisting you, in the event you need help. This adult should keep you company at home for at least 6 hours after the procedure. . Blood Pressure Medicine: Take your blood pressure medicine with a sip of water the morning of the procedure. . Blood thinners: Notify our staff if you are taking any blood thinners. Depending on which one you take, there will be specific instructions on how and when to stop it. . Diabetics on insulin: Notify the staff so that you can be scheduled 1st case in the morning. If your diabetes requires high dose insulin, take only  of your normal insulin dose the morning of the procedure and notify the staff that you have done so. . Preventing infections: Shower with an antibacterial soap the morning of your procedure. . Build-up your immune system: Take 1000 mg of Vitamin C with every meal (3 times a day) the day prior to your procedure. . Antibiotics: Inform the staff if you have a condition or reason that requires you to take antibiotics before dental procedures. . Pregnancy: If you are pregnant, call and cancel the procedure. . Sickness: If you have a cold, fever, or any active infections, call and cancel the procedure. . Arrival: You must be in the facility at least 30 minutes prior to your scheduled procedure. . Children: Do not bring children with you. . Dress appropriately: Bring dark clothing that you would not mind if they get stained. . Valuables: Do not bring any jewelry or valuables.  Procedure  appointments are reserved for interventional treatments only. . No Prescription Refills. . No medication changes will be discussed during procedure appointments. . No disability issues will be discussed.  Reasons to call and reschedule or cancel your procedure: (Following these recommendations will minimize the risk of a serious complication.) . Surgeries: Avoid having procedures within 2 weeks of any surgery. (Avoid for 2 weeks before or after any surgery). . Flu Shots: Avoid having procedures within 2 weeks of a flu shots or . (Avoid for 2 weeks before or after immunizations). . Barium: Avoid having a procedure within 7-10 days after having had a radiological study involving the use of radiological contrast. (Myelograms, Barium swallow or enema study). . Heart attacks: Avoid any elective procedures or surgeries for the initial 6 months after a "Myocardial Infarction" (Heart Attack). . Blood thinners: It is imperative that you stop these medications before procedures. Let us know if you if you take any blood thinner.  . Infection: Avoid procedures during or within two weeks of an infection (including chest colds or gastrointestinal problems). Symptoms associated with infections include: Localized redness, fever, chills, night sweats or profuse sweating, burning sensation when voiding, cough, congestion, stuffiness, runny nose, sore throat, diarrhea, nausea, vomiting, cold or Flu symptoms, recent or current infections. It is specially important if the infection is over the area that we intend to treat. . Heart and lung problems: Symptoms that may suggest an active cardiopulmonary problem include: cough, chest pain, breathing difficulties or shortness of breath, dizziness, ankle swelling, uncontrolled high or unusually low blood pressure, and/or palpitations. If you are experiencing any of these symptoms, cancel   your procedure and contact your primary care physician for an evaluation.  Remember:   Regular Business hours are:  Monday to Thursday 8:00 AM to 4:00 PM  Provider's Schedule: Milinda Pointer, MD:  Procedure days: Tuesday and Thursday 7:30 AM to 4:00 PM  Gillis Santa, MD:  Procedure days: Monday and Wednesday 7:30 AM to 4:00 PM ____________________________________________________________________________________________   Pain Management Discharge Instructions  General Discharge Instructions :  If you need to reach your doctor call: Monday-Friday 8:00 am - 4:00 pm at [phone] or toll free [phone].  After clinic hours [phone] to have operator reach doctor.  Bring all of your medication bottles to all your appointments in the pain clinic.  To cancel or reschedule your appointment with Pain Management please remember to call 24 hours in advance to avoid a fee.  Refer to the educational materials which you have been given on: General Risks, I had my Procedure. Discharge Instructions, Post Sedation.  Post Procedure Instructions:  The drugs you were given will stay in your system until tomorrow, so for the next 24 hours you should not drive, make any legal decisions or drink any alcoholic beverages.  You may eat anything you prefer, but it is better to start with liquids then soups and crackers, and gradually work up to solid foods.  Please notify your doctor immediately if you have any unusual bleeding, trouble breathing or pain that is not related to your normal pain.  Depending on the type of procedure that was done, some parts of your body may feel week and/or numb.  This usually clears up by tonight or the next day.  Walk with the use of an assistive device or accompanied by an adult for the 24 hours.  You may use ice on the affected area for the first 24 hours.  Put ice in a Ziploc bag and cover with a towel and place against area 15 minutes on 15 minutes off.  You may switch to heat after 24 hours.Facet Blocks Patient  Information  Description: The facets are joints in the spine between the vertebrae.  Like any joints in the body, facets can become irritated and painful.  Arthritis can also effect the facets.  By injecting steroids and local anesthetic in and around these joints, we can temporarily block the nerve supply to them.  Steroids act directly on irritated nerves and tissues to reduce selling and inflammation which often leads to decreased pain.  Facet blocks may be done anywhere along the spine from the neck to the low back depending upon the location of your pain.   After numbing the skin with local anesthetic (like Novocaine), a small needle is passed onto the facet joints under x-ray guidance.  You may experience a sensation of pressure while this is being done.  The entire block usually lasts about 15-25 minutes.   Conditions which may be treated by facet blocks:   Low back/buttock pain  Neck/shoulder pain  Certain types of headaches  Preparation for the injection:  1. Do not eat any solid food or dairy products within 8 hours of your appointment. 2. You may drink clear liquid up to 3 hours before appointment.  Clear liquids include water, black coffee, juice or soda.  No milk or cream please. 3. You may take your regular medication, including pain medications, with a sip of water before your appointment.  Diabetics should hold regular insulin (if taken separately) and take 1/2 normal NPH dose the morning of the procedure.  Carry some sugar containing items with you to your appointment. 4. A driver must accompany you and be prepared to drive you home after your procedure. 5. Bring all your current medications with you. 6. An IV may be inserted and sedation may be given at the discretion of the physician. 7. A blood pressure cuff, EKG and other monitors will often be applied during the procedure.  Some patients may need to have extra oxygen administered for a short period. 8. You will be asked  to provide medical information, including your allergies and medications, prior to the procedure.  We must know immediately if you are taking blood thinners (like Coumadin/Warfarin) or if you are allergic to IV iodine contrast (dye).  We must know if you could possible be pregnant.  Possible side-effects:   Bleeding from needle site  Infection (rare, may require surgery)  Nerve injury (rare)  Numbness & tingling (temporary)  Difficulty urinating (rare, temporary)  Spinal headache (a headache worse with upright posture)  Light-headedness (temporary)  Pain at injection site (serveral days)  Decreased blood pressure (rare, temporary)  Weakness in arm/leg (temporary)  Pressure sensation in back/neck (temporary)   Call if you experience:   Fever/chills associated with headache or increased back/neck pain  Headache worsened by an upright position  New onset, weakness or numbness of an extremity below the injection site  Hives or difficulty breathing (go to the emergency room)  Inflammation or drainage at the injection site(s)  Severe back/neck pain greater than usual  New symptoms which are concerning to you  Please note:  Although the local anesthetic injected can often make your back or neck feel good for several hours after the injection, the pain will likely return. It takes 3-7 days for steroids to work.  You may not notice any pain relief for at least one week.  If effective, we will often do a series of 2-3 injections spaced 3-6 weeks apart to maximally decrease your pain.  After the initial series, you may be a candidate for a more permanent nerve block of the facets.  If you have any questions, please call #[phone]) [facility]

## 2018-08-27 NOTE — Progress Notes (Signed)
Safety precautions to be maintained throughout the outpatient stay will include: orient to surroundings, keep bed in low position, maintain call bell within reach at all times, provide assistance with transfer out of bed and ambulation.  

## 2018-09-18 ENCOUNTER — Ambulatory Visit: Payer: Medicare Other | Admitting: Pain Medicine

## 2018-09-18 NOTE — Progress Notes (Deleted)
Patient's Name: Laura Frey  MRN: [id number]  Referring Provider: Marinda Elk, MD  DOB: [birth year]  PCP: Marinda Elk, MD  DOS: 09/18/2018  Note by: Gaspar Cola, MD  Service setting: Ambulatory outpatient  Specialty: Interventional Pain Management  Patient type: Established  Location: [facility] (AMB) Pain Management Facility  Visit type: Interventional Procedure   Primary Reason for Visit: Interventional Pain Management Treatment. CC: No chief complaint on file.  Procedure:          Anesthesia, Analgesia, Anxiolysis:  Type: Lumbar Facet, Medial Branch Block(s) #2  Primary Purpose: Diagnostic Region: Posterolateral Lumbosacral Spine Level: L2, L3, L4, L5, & S1 Medial Branch Level(s). Injecting these levels blocks the L3-4, L4-5, and L5-S1 lumbar facet joints. Laterality: Bilateral  Type: Moderate (Conscious) Sedation combined with Local Anesthesia Indication(s): Analgesia and Anxiety Route: Intravenous (IV) IV Access: Secured Sedation: Meaningful verbal contact was maintained at all times during the procedure  Local Anesthetic: Lidocaine 1-2%  Position: Prone   Indications: 1. Spondylosis without myelopathy or radiculopathy, lumbar region   2. Lumbar facet syndrome (Bilateral)   3. Lumbar facet joint osteoarthritis (Bilateral)   4. Lumbar facet hypertrophy (Bilateral)   5. Lumbar spondylosis   6. Grade 1 anterolisthesis of L3 over L4   7. Grade 1 retrolisthesis of L5 over S1   8. DDD (degenerative disc disease), lumbar   9. Chronic low back pain (Primary Area of Pain) (Bilateral) (R>L)    Pain Score: Pre-procedure:  /10 Post-procedure:  /10  Pre-op Assessment:  Laura Frey is a 84 y.o. (year old), female patient, seen today for interventional treatment. She  has a past surgical history that includes Tubal ligation; Appendectomy; Tonsillectomy; Eye surgery (Bilateral); Abdominal hysterectomy; Bladder repair; and Anterior cervical decomp/discectomy fusion  (N/A, 05/01/2013). Laura Frey has a current medication list which includes the following prescription(s): albuterol, amlodipine, gnp calcium 1200, vitamin d3, duloxetine, duloxetine hcl, fexofenadine, furosemide, guaifenesin, levothyroxine, loperamide, lorazepam, magnesium, magnesium oxide, metoprolol, multivitamin with minerals, nitrofurantoin (macrocrystal-monohydrate), omeprazole, potassium chloride sa, solifenacin, vitamin c, vitamin e, and vitamins/minerals. Her primarily concern today is the No chief complaint on file.  Initial Vital Signs:  Pulse/HCG Rate:    Temp:   Resp:   BP:   SpO2:    BMI: Estimated body mass index is 32.37 kg/m as calculated from the following:   Height as of 08/27/18: 5\' 2"  (1.575 m).   Weight as of 08/27/18: 177 lb (80.3 kg).  Risk Assessment: Allergies: Reviewed. She is allergic to ciprofloxacin; hydrocodone; oxycodone; zoloft [sertraline hcl]; sertraline; sulfa antibiotics; tylenol [acetaminophen]; and ultram [tramadol].  Allergy Precautions: None required Coagulopathies: Reviewed. None identified.  Blood-thinner therapy: None at this time Active Infection(s): Reviewed. None identified. Laura Frey is afebrile  Site Confirmation: Laura Frey was asked to confirm the procedure and laterality before marking the site Procedure checklist: Completed Consent: Before the procedure and under the influence of no sedative(s), amnesic(s), or anxiolytics, the patient was informed of the treatment options, risks and possible complications. To fulfill our ethical and legal obligations, as recommended by the American Medical Association's Code of Ethics, I have informed the patient of my clinical impression; the nature and purpose of the treatment or procedure; the risks, benefits, and possible complications of the intervention; the alternatives, including doing nothing; the risk(s) and benefit(s) of the alternative treatment(s) or procedure(s); and the risk(s) and benefit(s) of  doing nothing. The patient was provided information about the general risks and possible complications associated with the procedure. These  may include, but are not limited to: failure to achieve desired goals, infection, bleeding, organ or nerve damage, allergic reactions, paralysis, and death. In addition, the patient was informed of those risks and complications associated to Spine-related procedures, such as failure to decrease pain; infection (i.e.: Meningitis, epidural or intraspinal abscess); bleeding (i.e.: epidural hematoma, subarachnoid hemorrhage, or any other type of intraspinal or peri-dural bleeding); organ or nerve damage (i.e.: Any type of peripheral nerve, nerve root, or spinal cord injury) with subsequent damage to sensory, motor, and/or autonomic systems, resulting in permanent pain, numbness, and/or weakness of one or several areas of the body; allergic reactions; (i.e.: anaphylactic reaction); and/or death. Furthermore, the patient was informed of those risks and complications associated with the medications. These include, but are not limited to: allergic reactions (i.e.: anaphylactic or anaphylactoid reaction(s)); adrenal axis suppression; blood sugar elevation that in diabetics may result in ketoacidosis or comma; water retention that in patients with history of congestive heart failure may result in shortness of breath, pulmonary edema, and decompensation with resultant heart failure; weight gain; swelling or edema; medication-induced neural toxicity; particulate matter embolism and blood vessel occlusion with resultant organ, and/or nervous system infarction; and/or aseptic necrosis of one or more joints. Finally, the patient was informed that Medicine is not an exact science; therefore, there is also the possibility of unforeseen or unpredictable risks and/or possible complications that may result in a catastrophic outcome. The patient indicated having understood very clearly. We have  given the patient no guarantees and we have made no promises. Enough time was given to the patient to ask questions, all of which were answered to the patient's satisfaction. Laura Frey has indicated that she wanted to continue with the procedure. Attestation: I, the ordering provider, attest that I have discussed with the patient the benefits, risks, side-effects, alternatives, likelihood of achieving goals, and potential problems during recovery for the procedure that I have provided informed consent. Date  Time: {CHL [facility]-PAIN TIME CHOICES:[id number]}  Pre-Procedure Preparation:  Monitoring: As per clinic protocol. Respiration, ETCO2, SpO2, BP, heart rate and rhythm monitor placed and checked for adequate function Safety Precautions: Patient was assessed for positional comfort and pressure points before starting the procedure. Time-out: I initiated and conducted the "Time-out" before starting the procedure, as per protocol. The patient was asked to participate by confirming the accuracy of the "Time Out" information. Verification of the correct person, site, and procedure were performed and confirmed by me, the nursing staff, and the patient. "Time-out" conducted as per Joint Commission's Universal Protocol (UP.01.01.01). Time:    Description of Procedure:          Laterality: Bilateral. The procedure was performed in identical fashion on both sides. Levels:  L2, L3, L4, L5, & S1 Medial Branch Level(s) Area Prepped: Posterior Lumbosacral Region Prepping solution: ChloraPrep (2% chlorhexidine gluconate and 70% isopropyl alcohol) Safety Precautions: Aspiration looking for blood return was conducted prior to all injections. At no point did we inject any substances, as a needle was being advanced. Before injecting, the patient was told to immediately notify me if she was experiencing any new onset of "ringing in the ears, or metallic taste in the mouth". No attempts were made at seeking any paresthesias.  Safe injection practices and needle disposal techniques used. Medications properly checked for expiration dates. SDV (single dose vial) medications used. After the completion of the procedure, all disposable equipment used was discarded in the proper designated medical waste containers. Local Anesthesia: Protocol guidelines were followed. The  patient was positioned over the fluoroscopy table. The area was prepped in the usual manner. The time-out was completed. The target area was identified using fluoroscopy. A 12-in long, straight, sterile hemostat was used with fluoroscopic guidance to locate the targets for each level blocked. Once located, the skin was marked with an approved surgical skin marker. Once all sites were marked, the skin (epidermis, dermis, and hypodermis), as well as deeper tissues (fat, connective tissue and muscle) were infiltrated with a small amount of a short-acting local anesthetic, loaded on a 10cc syringe with a 25G, 1.5-in  Needle. An appropriate amount of time was allowed for local anesthetics to take effect before proceeding to the next step. Local Anesthetic: Lidocaine 2.0% The unused portion of the local anesthetic was discarded in the proper designated containers. Technical explanation of process:  L2 Medial Branch Nerve Block (MBB): The target area for the L2 medial branch is at the junction of the postero-lateral aspect of the superior articular process and the superior, posterior, and medial edge of the transverse process of L3. Under fluoroscopic guidance, a Quincke needle was inserted until contact was made with os over the superior postero-lateral aspect of the pedicular shadow (target area). After negative aspiration for blood, 0.5 mL of the nerve block solution was injected without difficulty or complication. The needle was removed intact. L3 Medial Branch Nerve Block (MBB): The target area for the L3 medial branch is at the junction of the postero-lateral aspect of the  superior articular process and the superior, posterior, and medial edge of the transverse process of L4. Under fluoroscopic guidance, a Quincke needle was inserted until contact was made with os over the superior postero-lateral aspect of the pedicular shadow (target area). After negative aspiration for blood, 0.5 mL of the nerve block solution was injected without difficulty or complication. The needle was removed intact. L4 Medial Branch Nerve Block (MBB): The target area for the L4 medial branch is at the junction of the postero-lateral aspect of the superior articular process and the superior, posterior, and medial edge of the transverse process of L5. Under fluoroscopic guidance, a Quincke needle was inserted until contact was made with os over the superior postero-lateral aspect of the pedicular shadow (target area). After negative aspiration for blood, 0.5 mL of the nerve block solution was injected without difficulty or complication. The needle was removed intact. L5 Medial Branch Nerve Block (MBB): The target area for the L5 medial branch is at the junction of the postero-lateral aspect of the superior articular process and the superior, posterior, and medial edge of the sacral ala. Under fluoroscopic guidance, a Quincke needle was inserted until contact was made with os over the superior postero-lateral aspect of the pedicular shadow (target area). After negative aspiration for blood, 0.5 mL of the nerve block solution was injected without difficulty or complication. The needle was removed intact. S1 Medial Branch Nerve Block (MBB): The target area for the S1 medial branch is at the posterior and inferior 6 o'clock position of the L5-S1 facet joint. Under fluoroscopic guidance, the Quincke needle inserted for the L5 MBB was redirected until contact was made with os over the inferior and postero aspect of the sacrum, at the 6 o' clock position under the L5-S1 facet joint (Target area). After negative  aspiration for blood, 0.5 mL of the nerve block solution was injected without difficulty or complication. The needle was removed intact. Procedural Needles: 22-gauge, 3.5-inch, Quincke needles used for all levels. Nerve block solution: 0.2%  PF-Ropivacaine + Triamcinolone (40 mg/mL) diluted to a final concentration of 4 mg of Triamcinolone/mL of Ropivacaine The unused portion of the solution was discarded in the proper designated containers.  Once the entire procedure was completed, the treated area was cleaned, making sure to leave some of the prepping solution back to take advantage of its long term bactericidal properties.   Illustration of the posterior view of the lumbar spine and the posterior neural structures. Laminae of L2 through S1 are labeled. DPRL5, dorsal primary ramus of L5; DPRS1, dorsal primary ramus of S1; DPR3, dorsal primary ramus of L3; FJ, facet (zygapophyseal) joint L3-L4; I, inferior articular process of L4; LB1, lateral branch of dorsal primary ramus of L1; IAB, inferior articular branches from L3 medial branch (supplies L4-L5 facet joint); IBP, intermediate branch plexus; MB3, medial branch of dorsal primary ramus of L3; NR3, third lumbar nerve root; S, superior articular process of L5; SAB, superior articular branches from L4 (supplies L4-5 facet joint also); TP3, transverse process of L3.  There were no vitals filed for this visit.   Start Time:   hrs. End Time:   hrs.  Imaging Guidance (Spinal):          Type of Imaging Technique: Fluoroscopy Guidance (Spinal) Indication(s): Assistance in needle guidance and placement for procedures requiring needle placement in or near specific anatomical locations not easily accessible without such assistance. Exposure Time: Please see nurses notes. Contrast: None used. Fluoroscopic Guidance: I was personally present during the use of fluoroscopy. "Tunnel Vision Technique" used to obtain the best possible view of the target area.  Parallax error corrected before commencing the procedure. "Direction-depth-direction" technique used to introduce the needle under continuous pulsed fluoroscopy. Once target was reached, antero-posterior, oblique, and lateral fluoroscopic projection used confirm needle placement in all planes. Images permanently stored in EMR. Interpretation: No contrast injected. I personally interpreted the imaging intraoperatively. Adequate needle placement confirmed in multiple planes. Permanent images saved into the patient's record.  Antibiotic Prophylaxis:   Anti-infectives (From admission, onward)   None     Indication(s): None identified  Post-operative Assessment:  Post-procedure Vital Signs:  Pulse/HCG Rate:    Temp:   Resp:   BP:   SpO2:    EBL: None  Complications: No immediate post-treatment complications observed by team, or reported by patient.  Note: The patient tolerated the entire procedure well. A repeat set of vitals were taken after the procedure and the patient was kept under observation following institutional policy, for this type of procedure. Post-procedural neurological assessment was performed, showing return to baseline, prior to discharge. The patient was provided with post-procedure discharge instructions, including a section on how to identify potential problems. Should any problems arise concerning this procedure, the patient was given instructions to immediately contact us, at any time, without hesitation. In any case, we plan to contact the patient by telephone for a follow-up status report regarding this interventional procedure.  Comments:  No additional relevant information.  Plan of Care   Imaging Orders  No imaging studies ordered today   Procedure Orders    No procedure(s) ordered today    Medications ordered for procedure: No orders of the defined types were placed in this encounter.  Medications administered: Laura Frey had no medications  administered during this visit.  See the medical record for exact dosing, route, and time of administration.  Disposition: Discharge home  Discharge Date & Time: 09/18/2018;   hrs.   Physician-requested Follow-up: No follow-ups on file.  Future Appointments  Date Time Provider [place]  09/18/2018 12:45 PM Milinda Pointer, MD [facility] None   Primary Care Physician: Marinda Elk, MD Location: [facility] Note by: Gaspar Cola, MD Date: 09/18/2018; Time: 6:39 AM  Disclaimer:  Medicine is not an exact science. The only guarantee in medicine is that nothing is guaranteed. It is important to note that the decision to proceed with this intervention was based on the information collected from the patient. The Data and conclusions were drawn from the patient's questionnaire, the interview, and the physical examination. Because the information was provided in large part by the patient, it cannot be guaranteed that it has not been purposely or unconsciously manipulated. Every effort has been made to obtain as much relevant data as possible for this evaluation. It is important to note that the conclusions that lead to this procedure are derived in large part from the available data. Always take into account that the treatment will also be dependent on availability of resources and existing treatment guidelines, considered by other Pain Management Practitioners as being common knowledge and practice, at the time of the intervention. For Medico-Legal purposes, it is also important to point out that variation in procedural techniques and pharmacological choices are the acceptable norm. The indications, contraindications, technique, and results of the above procedure should only be interpreted and judged by a Board-Certified Interventional Pain Specialist with extensive familiarity and expertise in the same exact procedure and technique.

## 2018-09-27 ENCOUNTER — Ambulatory Visit: Payer: Medicare Other | Admitting: Pain Medicine

## 2018-10-30 ENCOUNTER — Encounter: Payer: Self-pay | Admitting: Pain Medicine

## 2018-10-30 ENCOUNTER — Ambulatory Visit
Admission: RE | Admit: 2018-10-30 | Discharge: 2018-10-30 | Disposition: A | Discharge status: Home / Self Care | Payer: Medicare Other | Source: Ambulatory Visit | Attending: Pain Medicine | Admitting: Pain Medicine

## 2018-10-30 ENCOUNTER — Ambulatory Visit (HOSPITAL_BASED_OUTPATIENT_CLINIC_OR_DEPARTMENT_OTHER): Payer: Medicare Other | Admitting: Pain Medicine

## 2018-10-30 ENCOUNTER — Other Ambulatory Visit: Payer: Self-pay

## 2018-10-30 VITALS — BP 133/72 | HR 88 | Temp 97.6°F | Resp 20 | Ht 62.0 in | Wt 177.0 lb

## 2018-10-30 DIAGNOSIS — M47816 Spondylosis without myelopathy or radiculopathy, lumbar region: Secondary | ICD-10-CM

## 2018-10-30 DIAGNOSIS — G8929 Other chronic pain: Secondary | ICD-10-CM

## 2018-10-30 DIAGNOSIS — M5441 Lumbago with sciatica, right side: Secondary | ICD-10-CM

## 2018-10-30 DIAGNOSIS — M5136 Other intervertebral disc degeneration, lumbar region: Secondary | ICD-10-CM | POA: Insufficient documentation

## 2018-10-30 DIAGNOSIS — M5442 Lumbago with sciatica, left side: Secondary | ICD-10-CM | POA: Insufficient documentation

## 2018-10-30 DIAGNOSIS — M4726 Other spondylosis with radiculopathy, lumbar region: Secondary | ICD-10-CM

## 2018-10-30 MED ORDER — TRIAMCINOLONE ACETONIDE 40 MG/ML IJ SUSP
80.0000 mg | Freq: Once | INTRAMUSCULAR | Status: AC
  Administered 2018-10-30: 80 mg
  Filled 2018-10-30: qty 2

## 2018-10-30 MED ORDER — LIDOCAINE HCL 2 % IJ SOLN
20.0000 mL | Freq: Once | INTRAMUSCULAR | Status: AC
  Administered 2018-10-30: 400 mg
  Filled 2018-10-30: qty 40

## 2018-10-30 MED ORDER — MIDAZOLAM HCL 5 MG/5ML IJ SOLN
1.0000 mg | INTRAMUSCULAR | Status: DC | PRN
  Administered 2018-10-30: 2 mg via INTRAVENOUS
  Filled 2018-10-30: qty 5

## 2018-10-30 MED ORDER — FENTANYL CITRATE (PF) 100 MCG/2ML IJ SOLN
25.0000 ug | INTRAMUSCULAR | Status: DC | PRN
  Administered 2018-10-30: 50 ug via INTRAVENOUS
  Filled 2018-10-30: qty 2

## 2018-10-30 MED ORDER — ROPIVACAINE HCL 2 MG/ML IJ SOLN
18.0000 mL | Freq: Once | INTRAMUSCULAR | Status: AC
  Administered 2018-10-30: 18 mL via PERINEURAL
  Filled 2018-10-30: qty 20

## 2018-10-30 MED ORDER — LACTATED RINGERS IV SOLN
1000.0000 mL | Freq: Once | INTRAVENOUS | Status: AC
  Administered 2018-10-30: 1000 mL via INTRAVENOUS

## 2018-10-30 NOTE — Progress Notes (Signed)
Safety precautions to be maintained throughout the outpatient stay will include: orient to surroundings, keep bed in low position, maintain call bell within reach at all times, provide assistance with transfer out of bed and ambulation.  

## 2018-10-30 NOTE — Progress Notes (Signed)
Patient's Name: Laura Frey  MRN: [id number]  Referring Provider: Marinda Elk, MD  DOB: 1932/06/11  PCP: Marinda Elk, MD  DOS: 10/30/2018  Note by: Gaspar Cola, MD  Service setting: Ambulatory outpatient  Specialty: Interventional Pain Management  Patient type: Established  Location: [facility] (AMB) Pain Management Facility  Visit type: Interventional Procedure   Primary Reason for Visit: Interventional Pain Management Treatment. CC: Back Pain (lower)  Procedure:          Anesthesia, Analgesia, Anxiolysis:  Type: Lumbar Facet, Medial Branch Block(s) #2  Primary Purpose: Diagnostic Region: Posterolateral Lumbosacral Spine Level: L2, L3, L4, L5, & S1 Medial Branch Level(s). Injecting these levels blocks the L3-4, L4-5, and L5-S1 lumbar facet joints. Laterality: Bilateral  Type: Moderate (Conscious) Sedation combined with Local Anesthesia Indication(s): Analgesia and Anxiety Route: Intravenous (IV) IV Access: Secured Sedation: Meaningful verbal contact was maintained at all times during the procedure  Local Anesthetic: Lidocaine 1-2%  Position: Prone   Indications: 1. Spondylosis without myelopathy or radiculopathy, lumbar region   2. Lumbar facet syndrome (Bilateral)   3. Lumbar facet joint osteoarthritis (Bilateral)   4. Lumbar facet hypertrophy (Bilateral)   5. DDD (degenerative disc disease), lumbar   6. Lumbar spondylosis   7. Chronic low back pain (Primary Area of Pain) (Bilateral) (R>L)    Pain Score: Pre-procedure: 7 /10 Post-procedure: 0-No pain/10  Pre-op Assessment:  Laura Frey is a 85 y.o. (year old), female patient, seen today for interventional treatment. She  has a past surgical history that includes Tubal ligation; Appendectomy; Tonsillectomy; Eye surgery (Bilateral); Abdominal hysterectomy; Bladder repair; and Anterior cervical decomp/discectomy fusion (N/A, 05/01/2013). Laura Frey has a current medication list which includes the following  prescription(s): albuterol, amlodipine, gnp calcium 1200, vitamin d3, duloxetine, fexofenadine, furosemide, guaifenesin, levothyroxine, loperamide, lorazepam, magnesium, magnesium oxide, metoprolol, multivitamin with minerals, nitrofurantoin (macrocrystal-monohydrate), omeprazole, potassium chloride sa, solifenacin, vitamin c, vitamin e, vitamins/minerals, and duloxetine hcl, and the following Facility-Administered Medications: fentanyl and midazolam. Her primarily concern today is the Back Pain (lower)  Initial Vital Signs:  Pulse/HCG Rate: 88ECG Heart Rate: 85 Temp: 97.7 F (36.5 C) Resp: 16 BP: (!) 141/72 SpO2: 96 %  BMI: Estimated body mass index is 32.37 kg/m as calculated from the following:   Height as of this encounter: 5\' 2"  (1.575 m).   Weight as of this encounter: 177 lb (80.3 kg).  Risk Assessment: Allergies: Reviewed. She is allergic to ciprofloxacin; hydrocodone; oxycodone; zoloft [sertraline hcl]; sertraline; sulfa antibiotics; tylenol [acetaminophen]; and ultram [tramadol].  Allergy Precautions: None required Coagulopathies: Reviewed. None identified.  Blood-thinner therapy: None at this time Active Infection(s): Reviewed. None identified. Laura Frey is afebrile  Site Confirmation: Laura Frey was asked to confirm the procedure and laterality before marking the site Procedure checklist: Completed Consent: Before the procedure and under the influence of no sedative(s), amnesic(s), or anxiolytics, the patient was informed of the treatment options, risks and possible complications. To fulfill our ethical and legal obligations, as recommended by the American Medical Association's Code of Ethics, I have informed the patient of my clinical impression; the nature and purpose of the treatment or procedure; the risks, benefits, and possible complications of the intervention; the alternatives, including doing nothing; the risk(s) and benefit(s) of the alternative treatment(s) or  procedure(s); and the risk(s) and benefit(s) of doing nothing. The patient was provided information about the general risks and possible complications associated with the procedure. These may include, but are not limited to: failure to achieve desired goals,  infection, bleeding, organ or nerve damage, allergic reactions, paralysis, and death. In addition, the patient was informed of those risks and complications associated to Spine-related procedures, such as failure to decrease pain; infection (i.e.: Meningitis, epidural or intraspinal abscess); bleeding (i.e.: epidural hematoma, subarachnoid hemorrhage, or any other type of intraspinal or peri-dural bleeding); organ or nerve damage (i.e.: Any type of peripheral nerve, nerve root, or spinal cord injury) with subsequent damage to sensory, motor, and/or autonomic systems, resulting in permanent pain, numbness, and/or weakness of one or several areas of the body; allergic reactions; (i.e.: anaphylactic reaction); and/or death. Furthermore, the patient was informed of those risks and complications associated with the medications. These include, but are not limited to: allergic reactions (i.e.: anaphylactic or anaphylactoid reaction(s)); adrenal axis suppression; blood sugar elevation that in diabetics may result in ketoacidosis or comma; water retention that in patients with history of congestive heart failure may result in shortness of breath, pulmonary edema, and decompensation with resultant heart failure; weight gain; swelling or edema; medication-induced neural toxicity; particulate matter embolism and blood vessel occlusion with resultant organ, and/or nervous system infarction; and/or aseptic necrosis of one or more joints. Finally, the patient was informed that Medicine is not an exact science; therefore, there is also the possibility of unforeseen or unpredictable risks and/or possible complications that may result in a catastrophic outcome. The patient  indicated having understood very clearly. We have given the patient no guarantees and we have made no promises. Enough time was given to the patient to ask questions, all of which were answered to the patient's satisfaction. Ms. Molenda has indicated that she wanted to continue with the procedure. Attestation: I, the ordering provider, attest that I have discussed with the patient the benefits, risks, side-effects, alternatives, likelihood of achieving goals, and potential problems during recovery for the procedure that I have provided informed consent. Date  Time: 10/30/2018  1:11 PM  Pre-Procedure Preparation:  Monitoring: As per clinic protocol. Respiration, ETCO2, SpO2, BP, heart rate and rhythm monitor placed and checked for adequate function Safety Precautions: Patient was assessed for positional comfort and pressure points before starting the procedure. Time-out: I initiated and conducted the "Time-out" before starting the procedure, as per protocol. The patient was asked to participate by confirming the accuracy of the "Time Out" information. Verification of the correct person, site, and procedure were performed and confirmed by me, the nursing staff, and the patient. "Time-out" conducted as per Joint Commission's Universal Protocol (UP.01.01.01). Time: 1410  Description of Procedure:          Laterality: Bilateral. The procedure was performed in identical fashion on both sides. Levels:  L2, L3, L4, L5, & S1 Medial Branch Level(s) Area Prepped: Posterior Lumbosacral Region Prepping solution: ChloraPrep (2% chlorhexidine gluconate and 70% isopropyl alcohol) Safety Precautions: Aspiration looking for blood return was conducted prior to all injections. At no point did we inject any substances, as a needle was being advanced. Before injecting, the patient was told to immediately notify me if she was experiencing any new onset of "ringing in the ears, or metallic taste in the mouth". No attempts were  made at seeking any paresthesias. Safe injection practices and needle disposal techniques used. Medications properly checked for expiration dates. SDV (single dose vial) medications used. After the completion of the procedure, all disposable equipment used was discarded in the proper designated medical waste containers. Local Anesthesia: Protocol guidelines were followed. The patient was positioned over the fluoroscopy table. The area was prepped in the  usual manner. The time-out was completed. The target area was identified using fluoroscopy. A 12-in long, straight, sterile hemostat was used with fluoroscopic guidance to locate the targets for each level blocked. Once located, the skin was marked with an approved surgical skin marker. Once all sites were marked, the skin (epidermis, dermis, and hypodermis), as well as deeper tissues (fat, connective tissue and muscle) were infiltrated with a small amount of a short-acting local anesthetic, loaded on a 10cc syringe with a 25G, 1.5-in  Needle. An appropriate amount of time was allowed for local anesthetics to take effect before proceeding to the next step. Local Anesthetic: Lidocaine 2.0% The unused portion of the local anesthetic was discarded in the proper designated containers. Technical explanation of process:  L2 Medial Branch Nerve Block (MBB): The target area for the L2 medial branch is at the junction of the postero-lateral aspect of the superior articular process and the superior, posterior, and medial edge of the transverse process of L3. Under fluoroscopic guidance, a Quincke needle was inserted until contact was made with os over the superior postero-lateral aspect of the pedicular shadow (target area). After negative aspiration for blood, 0.5 mL of the nerve block solution was injected without difficulty or complication. The needle was removed intact. L3 Medial Branch Nerve Block (MBB): The target area for the L3 medial branch is at the junction of  the postero-lateral aspect of the superior articular process and the superior, posterior, and medial edge of the transverse process of L4. Under fluoroscopic guidance, a Quincke needle was inserted until contact was made with os over the superior postero-lateral aspect of the pedicular shadow (target area). After negative aspiration for blood, 0.5 mL of the nerve block solution was injected without difficulty or complication. The needle was removed intact. L4 Medial Branch Nerve Block (MBB): The target area for the L4 medial branch is at the junction of the postero-lateral aspect of the superior articular process and the superior, posterior, and medial edge of the transverse process of L5. Under fluoroscopic guidance, a Quincke needle was inserted until contact was made with os over the superior postero-lateral aspect of the pedicular shadow (target area). After negative aspiration for blood, 0.5 mL of the nerve block solution was injected without difficulty or complication. The needle was removed intact. L5 Medial Branch Nerve Block (MBB): The target area for the L5 medial branch is at the junction of the postero-lateral aspect of the superior articular process and the superior, posterior, and medial edge of the sacral ala. Under fluoroscopic guidance, a Quincke needle was inserted until contact was made with os over the superior postero-lateral aspect of the pedicular shadow (target area). After negative aspiration for blood, 0.5 mL of the nerve block solution was injected without difficulty or complication. The needle was removed intact. S1 Medial Branch Nerve Block (MBB): The target area for the S1 medial branch is at the posterior and inferior 6 o'clock position of the L5-S1 facet joint. Under fluoroscopic guidance, the Quincke needle inserted for the L5 MBB was redirected until contact was made with os over the inferior and postero aspect of the sacrum, at the 6 o' clock position under the L5-S1 facet joint  (Target area). After negative aspiration for blood, 0.5 mL of the nerve block solution was injected without difficulty or complication. The needle was removed intact. Procedural Needles: 22-gauge, 3.5-inch, Quincke needles used for all levels. Nerve block solution: 0.2% PF-Ropivacaine + Triamcinolone (40 mg/mL) diluted to a final concentration of 4 mg  of Triamcinolone/mL of Ropivacaine The unused portion of the solution was discarded in the proper designated containers.  Once the entire procedure was completed, the treated area was cleaned, making sure to leave some of the prepping solution back to take advantage of its long term bactericidal properties.   Illustration of the posterior view of the lumbar spine and the posterior neural structures. Laminae of L2 through S1 are labeled. DPRL5, dorsal primary ramus of L5; DPRS1, dorsal primary ramus of S1; DPR3, dorsal primary ramus of L3; FJ, facet (zygapophyseal) joint L3-L4; I, inferior articular process of L4; LB1, lateral branch of dorsal primary ramus of L1; IAB, inferior articular branches from L3 medial branch (supplies L4-L5 facet joint); IBP, intermediate branch plexus; MB3, medial branch of dorsal primary ramus of L3; NR3, third lumbar nerve root; S, superior articular process of L5; SAB, superior articular branches from L4 (supplies L4-5 facet joint also); TP3, transverse process of L3.  Vitals:   10/30/18 1424 10/30/18 1434 10/30/18 1444 10/30/18 1454  BP: (!) 126/58 134/64 (!) 147/70 133/72  Pulse:      Resp: 16 16 20 20   Temp:  97.9 F (36.6 C)  97.6 F (36.4 C)  TempSrc:      SpO2: 97% 98% 95% 94%  Weight:      Height:         Start Time: 1410 hrs. End Time: 1424 hrs.  Imaging Guidance (Spinal):          Type of Imaging Technique: Fluoroscopy Guidance (Spinal) Indication(s): Assistance in needle guidance and placement for procedures requiring needle placement in or near specific anatomical locations not easily accessible  without such assistance. Exposure Time: Please see nurses notes. Contrast: None used. Fluoroscopic Guidance: I was personally present during the use of fluoroscopy. "Tunnel Vision Technique" used to obtain the best possible view of the target area. Parallax error corrected before commencing the procedure. "Direction-depth-direction" technique used to introduce the needle under continuous pulsed fluoroscopy. Once target was reached, antero-posterior, oblique, and lateral fluoroscopic projection used confirm needle placement in all planes. Images permanently stored in EMR. Interpretation: No contrast injected. I personally interpreted the imaging intraoperatively. Adequate needle placement confirmed in multiple planes. Permanent images saved into the patient's record.  Antibiotic Prophylaxis:   Anti-infectives (From admission, onward)   None     Indication(s): None identified  Post-operative Assessment:  Post-procedure Vital Signs:  Pulse/HCG Rate: 8889 Temp: 97.6 F (36.4 C) Resp: 20 BP: 133/72 SpO2: 94 %  EBL: None  Complications: No immediate post-treatment complications observed by team, or reported by patient.  Note: The patient tolerated the entire procedure well. A repeat set of vitals were taken after the procedure and the patient was kept under observation following institutional policy, for this type of procedure. Post-procedural neurological assessment was performed, showing return to baseline, prior to discharge. The patient was provided with post-procedure discharge instructions, including a section on how to identify potential problems. Should any problems arise concerning this procedure, the patient was given instructions to immediately contact us, at any time, without hesitation. In any case, we plan to contact the patient by telephone for a follow-up status report regarding this interventional procedure.  Comments:  No additional relevant information.  Plan of Care    Imaging Orders     DG C-Arm 1-60 Min-No Report  Procedure Orders     LUMBAR FACET(MEDIAL BRANCH NERVE BLOCK) MBNB  Medications ordered for procedure: Meds ordered this encounter  Medications  . lidocaine (XYLOCAINE) 2 % (  with pres) injection 400 mg  . midazolam (VERSED) 5 MG/5ML injection 1-2 mg    Make sure Flumazenil is available in the pyxis when using this medication. If oversedation occurs, administer 0.2 mg IV over 15 sec. If after 45 sec no response, administer 0.2 mg again over 1 min; may repeat at 1 min intervals; not to exceed 4 doses (1 mg)  . fentaNYL (SUBLIMAZE) injection 25-50 mcg    Make sure Narcan is available in the pyxis when using this medication. In the event of respiratory depression (RR< 8/min): Titrate NARCAN (naloxone) in increments of 0.1 to 0.2 mg IV at 2-3 minute intervals, until desired degree of reversal.  . lactated ringers infusion 1,000 mL  . ropivacaine (PF) 2 mg/mL (0.2%) (NAROPIN) injection 18 mL  . triamcinolone acetonide (KENALOG-40) injection 80 mg   Medications administered: We administered lidocaine, midazolam, fentaNYL, lactated ringers, ropivacaine (PF) 2 mg/mL (0.2%), and triamcinolone acetonide.  See the medical record for exact dosing, route, and time of administration.  Disposition: Discharge home  Discharge Date & Time: 10/30/2018; 1455 hrs.   Physician-requested Follow-up: Return for post-procedure eval (2 wks), w/ Dr. Dossie Arbour.  Future Appointments  Date Time Provider [place]  11/12/2018  1:45 PM Milinda Pointer, MD [facility] None   Primary Care Physician: Marinda Elk, MD Location: [facility] Note by: Gaspar Cola, MD Date: 10/30/2018; Time: 3:38 PM  Disclaimer:  Medicine is not an Chief Strategy Officer. The only guarantee in medicine is that nothing is guaranteed. It is important to note that the decision to proceed with this intervention was based on the information collected  from the patient. The Data and conclusions were drawn from the patient's questionnaire, the interview, and the physical examination. Because the information was provided in large part by the patient, it cannot be guaranteed that it has not been purposely or unconsciously manipulated. Every effort has been made to obtain as much relevant data as possible for this evaluation. It is important to note that the conclusions that lead to this procedure are derived in large part from the available data. Always take into account that the treatment will also be dependent on availability of resources and existing treatment guidelines, considered by other Pain Management Practitioners as being common knowledge and practice, at the time of the intervention. For Medico-Legal purposes, it is also important to point out that variation in procedural techniques and pharmacological choices are the acceptable norm. The indications, contraindications, technique, and results of the above procedure should only be interpreted and judged by a Board-Certified Interventional Pain Specialist with extensive familiarity and expertise in the same exact procedure and technique.

## 2018-10-30 NOTE — Patient Instructions (Signed)

## 2018-10-31 ENCOUNTER — Telehealth: Payer: Self-pay | Admitting: *Deleted

## 2018-10-31 NOTE — Telephone Encounter (Signed)
Attempted to call for post procedure follow-up. Unable to leave a message, mailbox full. 

## 2018-11-08 NOTE — Progress Notes (Deleted)
Patient's Name: Laura Frey  MRN: [id number]  Referring Provider: Marinda Elk, MD  DOB: 1932/08/07  PCP: Marinda Elk, MD  DOS: 11/12/2018  Note by: Gaspar Cola, MD  Service setting: Ambulatory outpatient  Specialty: Interventional Pain Management  Location: [facility] (AMB) Pain Management Facility    Patient type: Established   Primary Reason(s) for Visit: Encounter for post-procedure evaluation of chronic illness with mild to moderate exacerbation CC: No chief complaint on file.  HPI  Laura Frey is a 85 y.o. year old, female patient, who comes today for a post-procedure evaluation. She has Hypotension, unspecified; UTI (urinary tract infection); Unspecified hypothyroidism; GERD (gastroesophageal reflux disease); Nonspecific (abnormal) findings on radiological and other examination of gastrointestinal tract; Shortness of breath; HTN (hypertension); Anemia; SOB (shortness of breath); Diastolic CHF, acute (HCC); COPD (chronic obstructive pulmonary disease) ([place]); Hypoxia; Increased frequency of urination; Other chronic cystitis without hematuria; Recurrent UTI; Thalassemia; Urge incontinence; Anemia, unspecified; Acute diastolic heart failure ([place]); Esophageal reflux; Hypertension; Hypothyroid; Urinary tract infection; Abnormal MRI, lumbar spine (2018); L2 compression fracture; H/O cervical spine surgery (C4-C7 ACDF); Abnormal MRI, cervical spine (2014); Chronic low back pain (Primary Area of Pain) (Bilateral) (R>L); Lumbar facet joint osteoarthritis (Bilateral); Lumbar facet hypertrophy (Bilateral); Lumbar facet syndrome (Bilateral); Osteoarthritis of lumbar spine; Lumbar spondylosis; Cervical spondylosis; Cervical facet arthropathy (Bilateral); Chronic pain syndrome; Osteoarthritis; Grade 1 anterolisthesis of L3 over L4; Grade 1 retrolisthesis of L5 over S1; Lumbar lateral recess stenosis (Bilateral); Chronic lower extremity pain (Secondary Area of Pain) (Bilateral) (R>L); Chronic  lower extremity radicular pain (Bilateral) (R>L); Chronic lumbar radiculitis (Right); DDD (degenerative disc disease), lumbar; DDD (degenerative disc disease), cervical; Frequent falls; Gait instability; Disorder of skeletal system; Pharmacologic therapy; Problems influencing health status; Cervicalgia; Chronic upper back pain; Lower extremity weakness (Bilateral); Lower extremity weakness; Lower extremity numbness; Complaints of weakness of lower extremity; Osteopenia of lumbar spine; and Spondylosis without myelopathy or radiculopathy, lumbar region on their problem list. Her primarily concern today is the No chief complaint on file.  Pain Assessment: Location:     Radiating:   Onset:   Duration:   Quality:   Severity:  /10 (subjective, self-reported pain score)  Note: Reported level is compatible with observation.                         When using our objective Pain Scale, levels between 6 and 10/10 are said to belong in an emergency room, as it progressively worsens from a 6/10, described as severely limiting, requiring emergency care not usually available at an outpatient pain management facility. At a 6/10 level, communication becomes difficult and requires great effort. Assistance to reach the emergency department may be required. Facial flushing and profuse sweating along with potentially dangerous increases in heart rate and blood pressure will be evident. Effect on ADL:   Timing:   Modifying factors:   BP:    HR:    [name] comes in today for post-procedure evaluation.  Further details on both, my assessment(s), as well as the proposed treatment plan, please see below.  Post-Procedure Assessment  10/30/2018 Procedure: *** Pre-procedure pain score:        /10 Post-procedure pain score: 0/10         Influential Factors: BMI:   Intra-procedural challenges: None observed.         Assessment challenges: None detected.              Reported side-effects: None.  Post-procedural adverse reactions or complications: None reported         Sedation: Please see nurses note. When no sedatives are used, the analgesic levels obtained are directly associated to the effectiveness of the local anesthetics. However, when sedation is provided, the level of analgesia obtained during the initial 1 hour following the intervention, is believed to be the result of a combination of factors. These factors may include, but are not limited to: 1. The effectiveness of the local anesthetics used. 2. The effects of the analgesic(s) and/or anxiolytic(s) used. 3. The degree of discomfort experienced by the patient at the time of the procedure. 4. The patients ability and reliability in recalling and recording the events. 5. The presence and influence of possible secondary gains and/or psychosocial factors. Reported result: Relief experienced during the 1st hour after the procedure:   (Ultra-Short Term Relief)            Interpretative annotation: Clinically appropriate result. Analgesia during this period is likely to be Local Anesthetic and/or IV Sedative (Analgesic/Anxiolytic) related.          Effects of local anesthetic: The analgesic effects attained during this period are directly associated to the localized infiltration of local anesthetics and therefore cary significant diagnostic value as to the etiological location, or anatomical origin, of the pain. Expected duration of relief is directly dependent on the pharmacodynamics of the local anesthetic used. Long-acting (4-6 hours) anesthetics used.  Reported result: Relief during the next 4 to 6 hour after the procedure:   (Short-Term Relief)            Interpretative annotation: Clinically appropriate result. Analgesia during this period is likely to be Local Anesthetic-related.          Long-term benefit: Defined as the period of time past the expected duration of local anesthetics (1 hour for short-acting and 4-6 hours for  long-acting). With the possible exception of prolonged sympathetic blockade from the local anesthetics, benefits during this period are typically attributed to, or associated with, other factors such as analgesic sensory neuropraxia, antiinflammatory effects, or beneficial biochemical changes provided by agents other than the local anesthetics.  Reported result: Extended relief following procedure:   (Long-Term Relief)            Interpretative annotation: Clinically possible results. Good relief. No permanent benefit expected. Inflammation plays a part in the etiology to the pain.          Current benefits: Defined as reported results that persistent at this point in time.   Analgesia: *** %            Function: Somewhat improved ROM: Somewhat improved Interpretative annotation: Recurrence of symptoms. No permanent benefit expected. Effective diagnostic intervention.          Interpretation: Results would suggest a successful diagnostic intervention.                  Plan:  Please see "Plan of Care" for details.                Laboratory Chemistry  Inflammation Markers (CRP: Acute Phase) (ESR: Chronic Phase) Lab Results  Component Value Date   CRP 1.0 01/01/2018   ESRSEDRATE 12 01/01/2018   LATICACIDVEN 1.3 01/29/2014                         Rheumatology Markers No results found for: RF, ANA, [place], [place], [place], [place], [id number]  Renal Markers Lab Results  Component Value Date   BUN 19 01/01/2018   CREATININE 0.98 01/01/2018   BCR 19 01/01/2018   GFRAA 62 01/01/2018   GFRNONAA 54 (L) 01/01/2018                             Hepatic Markers Lab Results  Component Value Date   AST 23 01/01/2018   ALT 13 02/05/2014   ALBUMIN 4.5 01/01/2018                        Neuropathy Markers Lab Results  Component Value Date   VITAMINB12 365 01/01/2018   HGBA1C 6.0 (H) 01/27/2014                        Hematology Parameters Lab Results   Component Value Date   INR 1.31 01/30/2014   LABPROT 16.0 (H) 01/30/2014   APTT 39 (H) 01/29/2014   PLT 382 02/08/2014   HGB 9.1 (L) 02/08/2014   HCT 27.8 (L) 02/08/2014   DDIMER 9.34 (H) 02/05/2014                        CV Markers Lab Results  Component Value Date   CKTOTAL 206 (H) 02/04/2014   CKMB 10.4 (H) 02/04/2014   TROPONINI <0.30 02/05/2014                         Note: Lab results reviewed.  Recent Imaging Results   Results for orders placed in visit on 10/30/18  DG C-Arm 1-60 Min-No Report   Narrative Fluoroscopy was utilized by the requesting physician.  No radiographic  interpretation.    Interpretation Report: Fluoroscopy was used during the procedure to assist with needle guidance. The images were interpreted intraoperatively by the requesting physician.  Meds   Current Outpatient Medications:  .  albuterol (PROVENTIL HFA;VENTOLIN HFA) 108 (90 BASE) MCG/ACT inhaler, Inhale 2 puffs into the lungs every 6 (six) hours as needed for wheezing., Disp: , Rfl:  .  amLODipine (NORVASC) 5 MG tablet, Take 2 tablets (10 mg total) by mouth daily., Disp: 60 tablet, Rfl: 0 .  Calcium Carbonate-Vit D-Min (GNP CALCIUM 1200) 1200-1000 MG-UNIT CHEW, Chew 1,200 mg by mouth daily with breakfast. Take in combination with vitamin D and magnesium., Disp: 30 tablet, Rfl: 5 .  Cholecalciferol (VITAMIN D3) 5000 units CAPS, Take 1 capsule (5,000 Units total) by mouth daily with breakfast. Take along with calcium and magnesium., Disp: 30 capsule, Rfl: 5 .  DULoxetine (CYMBALTA) 60 MG capsule, , Disp: , Rfl: 0 .  DULOXETINE HCL PO, Take by mouth daily., Disp: , Rfl:  .  fexofenadine (ALLEGRA) 180 MG tablet, Take 180 mg by mouth daily., Disp: , Rfl:  .  furosemide (LASIX) 20 MG tablet, Take 1 tablet (20 mg total) by mouth daily., Disp: 30 tablet, Rfl: 0 .  guaifenesin (ROBITUSSIN) 100 MG/5ML syrup, Take 200 mg by mouth 3 (three) times daily as needed for cough., Disp: , Rfl:  .   levothyroxine (SYNTHROID, LEVOTHROID) 75 MCG tablet, Take 75 mcg by mouth daily before breakfast., Disp: , Rfl:  .  loperamide (IMODIUM) 2 MG capsule, Take 1 capsule (2 mg total) by mouth 2 (two) times daily as needed for diarrhea or loose stools., Disp: 20 capsule, Rfl: 0 .  LORazepam (ATIVAN) 0.5 MG  tablet, Take by mouth., Disp: , Rfl:  .  Magnesium 500 MG CAPS, Take 1 capsule (500 mg total) by mouth 2 (two) times daily at 8 am and 10 pm., Disp: 60 capsule, Rfl: 5 .  magnesium oxide (MAG-OX) 400 MG tablet, Take 400 mg by mouth daily., Disp: , Rfl:  .  metoprolol (TOPROL-XL) 200 MG 24 hr tablet, Take 200 mg by mouth daily., Disp: , Rfl:  .  Multiple Vitamin (MULTIVITAMIN WITH MINERALS) TABS, Take 1 tablet by mouth daily., Disp: , Rfl:  .  nitrofurantoin, macrocrystal-monohydrate, (MACROBID) 100 MG capsule, , Disp: , Rfl:  .  omeprazole (PRILOSEC) 20 MG capsule, Take 20 mg by mouth daily., Disp: , Rfl:  .  potassium chloride SA (K-DUR,KLOR-CON) 20 MEQ tablet, Take 2 tablets (40 mEq total) by mouth 2 (two) times daily with a meal., Disp: 60 tablet, Rfl: 0 .  solifenacin (VESICARE) 5 MG tablet, Take 5 mg by mouth daily., Disp: , Rfl:  .  vitamin C (ASCORBIC ACID) 500 MG tablet, Take 500 mg by mouth daily., Disp: , Rfl:  .  VITAMIN E PO, Take 500 Units by mouth daily., Disp: , Rfl:  .  Vitamins/Minerals TABS, Take by mouth., Disp: , Rfl:   ROS  Constitutional: Denies any fever or chills Gastrointestinal: No reported hemesis, hematochezia, vomiting, or acute GI distress Musculoskeletal: Denies any acute onset joint swelling, redness, loss of ROM, or weakness Neurological: No reported episodes of acute onset apraxia, aphasia, dysarthria, agnosia, amnesia, paralysis, loss of coordination, or loss of consciousness  Allergies  Ms. Goedde is allergic to ciprofloxacin; hydrocodone; oxycodone; zoloft [sertraline hcl]; sertraline; sulfa antibiotics; tylenol [acetaminophen]; and ultram [tramadol].  [place]   Drug: Ms. Hartmann  reports no history of drug use. Alcohol:  reports no history of alcohol use. Tobacco:  reports that she quit smoking about 23 years ago. Her smoking use included cigarettes. She has a 30.00 pack-year smoking history. She has never used smokeless tobacco. Medical:  has a past medical history of Anemia, Asthma, Cancer ([place]), Chronic back pain, Depression, GERD (gastroesophageal reflux disease), Headache(784.0), Hypertension, Hypothyroidism, Peripheral vascular disease ([place]), Sepsis ([place]) (01/27/2014), Shortness of breath, and Tendinitis of both rotator cuffs. Surgical: Ms. Lingenfelter  has a past surgical history that includes Tubal ligation; Appendectomy; Tonsillectomy; Eye surgery (Bilateral); Abdominal hysterectomy; Bladder repair; and Anterior cervical decomp/discectomy fusion (N/A, 05/01/2013). Family: family history includes Thalassemia in her paternal aunt and son.  Constitutional Exam  General appearance: Well nourished, well developed, and well hydrated. In no apparent acute distress There were no vitals filed for this visit. BMI Assessment: Estimated body mass index is 32.37 kg/m as calculated from the following:   Height as of 10/30/18: '5\' 2"'  (1.575 m).   Weight as of 10/30/18: 177 lb (80.3 kg).  BMI interpretation table: BMI level Category Range association with higher incidence of chronic pain  <18 kg/m2 Underweight   18.5-24.9 kg/m2 Ideal body weight   25-29.9 kg/m2 Overweight Increased incidence by 20%  30-34.9 kg/m2 Obese (Class I) Increased incidence by 68%  35-39.9 kg/m2 Severe obesity (Class II) Increased incidence by 136%  >40 kg/m2 Extreme obesity (Class III) Increased incidence by 254%   Patient's current BMI Ideal Body weight  There is no height or weight on file to calculate BMI. Ideal body weight: 50.1 kg (110 lb 7.2 oz) Adjusted ideal body weight: 62.2 kg (137 lb 1.1 oz)   BMI Readings from Last 4 Encounters:  10/30/18 32.37 kg/m  08/27/18 32.37 kg/m  08/09/18 32.37 kg/m  08/06/18 34.57 kg/m   Wt Readings from Last 4 Encounters:  10/30/18 177 lb (80.3 kg)  08/27/18 177 lb (80.3 kg)  08/09/18 177 lb (80.3 kg)  08/06/18 177 lb (80.3 kg)  Psych/Mental status: Alert, oriented x 3 (person, place, & time)       Eyes: PERLA Respiratory: No evidence of acute respiratory distress  Cervical Spine Area Exam  Skin & Axial Inspection: No masses, redness, edema, swelling, or associated skin lesions Alignment: Symmetrical Functional ROM: Unrestricted ROM      Stability: No instability detected Muscle Tone/Strength: Functionally intact. No obvious neuro-muscular anomalies detected. Sensory (Neurological): Unimpaired Palpation: No palpable anomalies              Upper Extremity (UE) Exam    Side: Right upper extremity  Side: Left upper extremity  Skin & Extremity Inspection: Skin color, temperature, and hair growth are WNL. No peripheral edema or cyanosis. No masses, redness, swelling, asymmetry, or associated skin lesions. No contractures.  Skin & Extremity Inspection: Skin color, temperature, and hair growth are WNL. No peripheral edema or cyanosis. No masses, redness, swelling, asymmetry, or associated skin lesions. No contractures.  Functional ROM: Unrestricted ROM          Functional ROM: Unrestricted ROM          Muscle Tone/Strength: Functionally intact. No obvious neuro-muscular anomalies detected.  Muscle Tone/Strength: Functionally intact. No obvious neuro-muscular anomalies detected.  Sensory (Neurological): Unimpaired          Sensory (Neurological): Unimpaired          Palpation: No palpable anomalies              Palpation: No palpable anomalies              Provocative Test(s):  Phalen's test: deferred Tinel's test: deferred Apley's scratch test (touch opposite shoulder):  Action 1 (Across chest): deferred Action 2 (Overhead): deferred Action 3 (LB reach): deferred   Provocative Test(s):  Phalen's test: deferred Tinel's  test: deferred Apley's scratch test (touch opposite shoulder):  Action 1 (Across chest): deferred Action 2 (Overhead): deferred Action 3 (LB reach): deferred    Thoracic Spine Area Exam  Skin & Axial Inspection: No masses, redness, or swelling Alignment: Symmetrical Functional ROM: Unrestricted ROM Stability: No instability detected Muscle Tone/Strength: Functionally intact. No obvious neuro-muscular anomalies detected. Sensory (Neurological): Unimpaired Muscle strength & Tone: No palpable anomalies  Lumbar Spine Area Exam  Skin & Axial Inspection: No masses, redness, or swelling Alignment: Symmetrical Functional ROM: Unrestricted ROM       Stability: No instability detected Muscle Tone/Strength: Functionally intact. No obvious neuro-muscular anomalies detected. Sensory (Neurological): Unimpaired Palpation: No palpable anomalies       Provocative Tests: Hyperextension/rotation test: deferred today       Lumbar quadrant test (Kemp's test): deferred today       Lateral bending test: deferred today       Patrick's Maneuver: deferred today                   FABER* test: deferred today                   S-I anterior distraction/compression test: deferred today         S-I lateral compression test: deferred today         S-I Thigh-thrust test: deferred today         S-I Gaenslen's test: deferred today         *(  Flexion, ABduction and External Rotation)  Gait & Posture Assessment  Ambulation: Unassisted Gait: Relatively normal for age and body habitus Posture: WNL   Lower Extremity Exam    Side: Right lower extremity  Side: Left lower extremity  Stability: No instability observed          Stability: No instability observed          Skin & Extremity Inspection: Skin color, temperature, and hair growth are WNL. No peripheral edema or cyanosis. No masses, redness, swelling, asymmetry, or associated skin lesions. No contractures.  Skin & Extremity Inspection: Skin color, temperature,  and hair growth are WNL. No peripheral edema or cyanosis. No masses, redness, swelling, asymmetry, or associated skin lesions. No contractures.  Functional ROM: Unrestricted ROM                  Functional ROM: Unrestricted ROM                  Muscle Tone/Strength: Functionally intact. No obvious neuro-muscular anomalies detected.  Muscle Tone/Strength: Functionally intact. No obvious neuro-muscular anomalies detected.  Sensory (Neurological): Unimpaired        Sensory (Neurological): Unimpaired        DTR: Patellar: deferred today Achilles: deferred today Plantar: deferred today  DTR: Patellar: deferred today Achilles: deferred today Plantar: deferred today  Palpation: No palpable anomalies  Palpation: No palpable anomalies   Assessment  Primary Diagnosis & Pertinent Problem List: There were no encounter diagnoses.  Status Diagnosis  Controlled Controlled Controlled No diagnosis found.  Problems updated and reviewed during this visit: No problems updated. Plan of Care  Pharmacotherapy (Medications Ordered): No orders of the defined types were placed in this encounter.  Medications administered today: Yee Gangi. Sleep had no medications administered during this visit.  Procedure Orders    No procedure(s) ordered today   Lab Orders  No laboratory test(s) ordered today   Imaging Orders  No imaging studies ordered today   Referral Orders  No referral(s) requested today   Interventional management options: Planned, scheduled, and/or pending:   ***   Considering:   ***   Palliative PRN treatment(s):   None at this time   Provider-requested follow-up: No follow-ups on file.  Future Appointments  Date Time Provider [place]  11/12/2018  1:45 PM Milinda Pointer, MD [facility] None  11/29/2018  9:00 AM Billey Co, MD [facility] None   Primary Care Physician: Marinda Elk, MD Location: [facility] Note by: Gaspar Cola, MD Date: 11/12/2018; Time: 2:29 PM

## 2018-11-12 ENCOUNTER — Ambulatory Visit: Payer: Medicare Other | Admitting: Pain Medicine

## 2018-11-29 ENCOUNTER — Ambulatory Visit: Payer: Self-pay | Admitting: Urology

## 2018-12-07 ENCOUNTER — Ambulatory Visit: Payer: Self-pay | Admitting: Urology

## 2019-01-08 ENCOUNTER — Ambulatory Visit: Payer: Self-pay | Admitting: Urology

## 2019-03-12 ENCOUNTER — Ambulatory Visit: Payer: Self-pay | Admitting: Urology

## 2019-04-02 ENCOUNTER — Ambulatory Visit: Payer: Self-pay | Admitting: Urology

## 2019-05-21 ENCOUNTER — Ambulatory Visit: Payer: Self-pay | Admitting: Urology

## 2019-05-21 ENCOUNTER — Encounter: Payer: Self-pay | Admitting: Urology

## 2019-05-21 ENCOUNTER — Encounter

## 2019-10-16 IMAGING — CR DG THORACIC SPINE 2V
3 series · 3 of 3 positions shown · non-contrast
Comparison: CT chest 02/05/2014

CLINICAL DATA: Chronic pain

EXAM:
THORACIC SPINE 2 VIEWS

[t-spine ap]
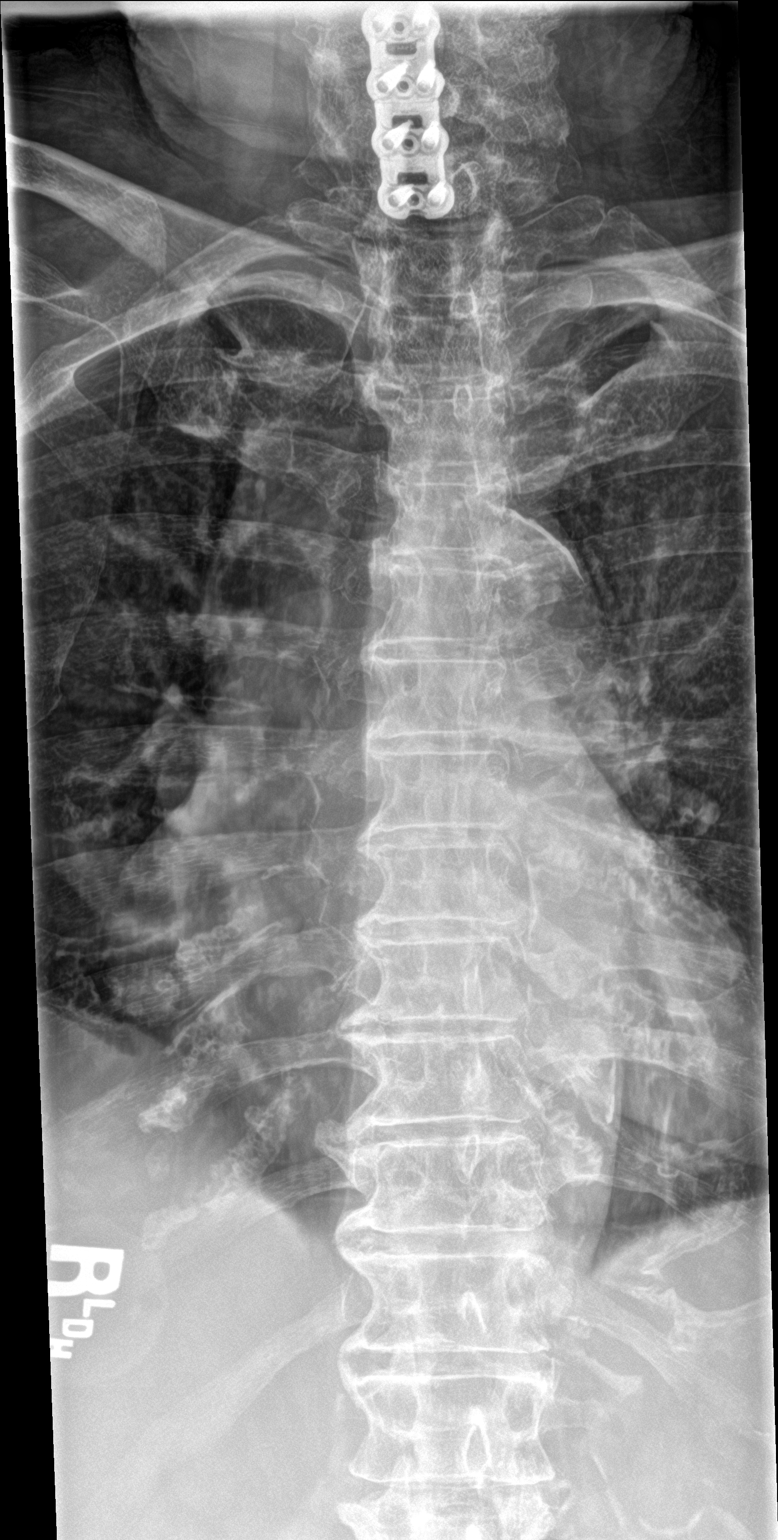

[t-spine lat]
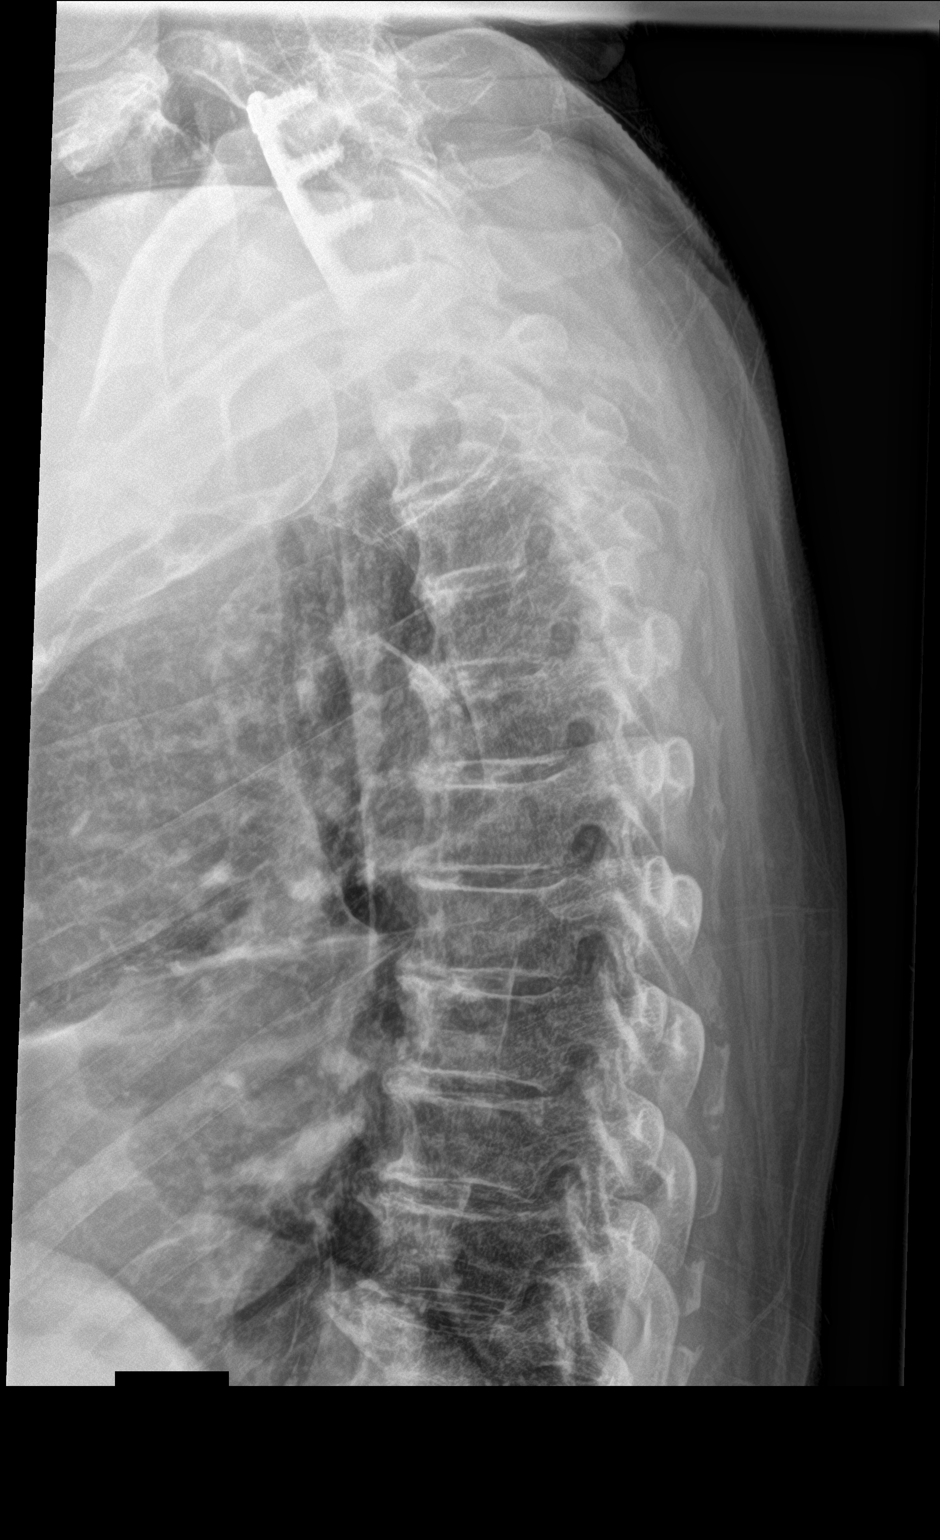

[t-spine swimmers]
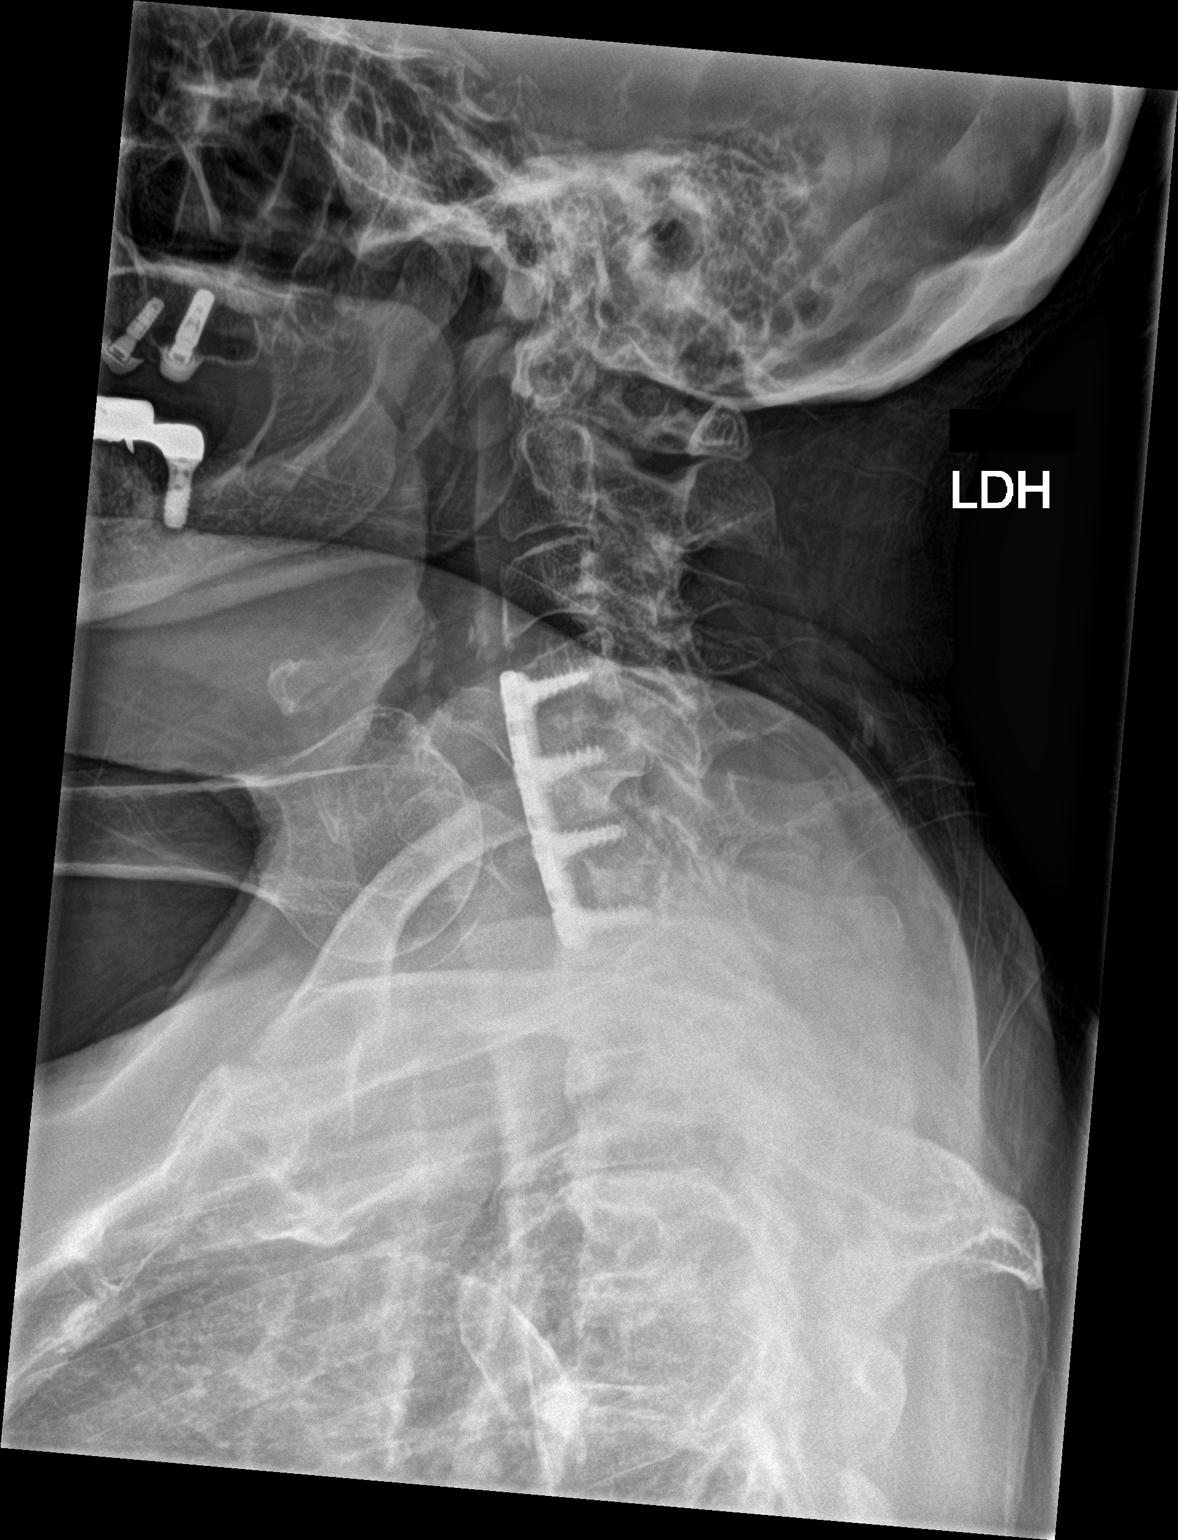

[3 of 3 positions shown; findings below may reference images not displayed]

FINDINGS: Thoracic alignment is within normal limits. Vertebral body heights
are normal. Disc spaces are grossly preserved. Anterior osteophytes
at essentially all levels of the thoracic spine. Right lateral
osteophytes at T2-T3 and from T5 through T12.
IMPRESSION: Diffuse degenerative changes of the thoracic spine as above.

## 2020-06-08 ENCOUNTER — Ambulatory Visit: Payer: Medicare Other | Admitting: Urology

## 2020-06-15 ENCOUNTER — Encounter: Payer: Self-pay | Admitting: Urology

## 2020-06-15 ENCOUNTER — Other Ambulatory Visit: Payer: Self-pay

## 2020-06-15 ENCOUNTER — Ambulatory Visit: Payer: Medicare Other | Admitting: Urology

## 2020-06-15 VITALS — BP 151/74 | HR 74 | Ht 62.0 in | Wt 175.0 lb

## 2020-06-15 DIAGNOSIS — N390 Urinary tract infection, site not specified: Secondary | ICD-10-CM

## 2020-06-15 DIAGNOSIS — N3020 Other chronic cystitis without hematuria: Secondary | ICD-10-CM

## 2020-06-15 LAB — BLADDER SCAN AMB NON-IMAGING

## 2020-06-15 MED ORDER — CEPHALEXIN 250 MG PO CAPS: 250.0000 mg | ORAL_CAPSULE | Freq: Every day | ORAL | 11 refills | Status: DC

## 2020-06-15 NOTE — Progress Notes (Signed)
In and Out Catheterization  Patient is present today for a I & O catheterization due to being unable to urinate. Patient was cleaned and prepped in a sterile fashion with betadine . A 14FR cath was inserted no complications were noted , 164ml of urine return was noted, urine was yellow in color. A clean urine sample was collected for UA and culture. Bladder was drained  And catheter was removed with out difficulty.    Performed by: Gaspar Cola, [place], CMA

## 2020-06-15 NOTE — Progress Notes (Signed)
06/15/2020 1:07 PM   Laura Frey 1932-10-02 [id number]  Referring provider: Marinda Elk, MD [facility] [place],  [place] [address]  Chief Complaint  Patient presents with   Recurrent UTI    HPI: I last saw the patient in [address] June 2018.  She was on daily Macrodantin and Premarin cream for vaginal burning and urinary tract infections.  She was on Detrol and had failed medication such as Vesicare and oxybutynin.  We were doing percutaneous tibial nerve stimulation.  I think she only had one treatment  The patient has been having headaches and weakness in her legs and her daughter describes perhaps 3+ cultures with different bacteria each treated.  She is on daily Macrodantin.  Symptoms were somewhat nonspecific.  At baseline she still double pads for urge incontinence and has bedwetting  I do not have any cultures in the medical record       PMH: Past Medical History:  Diagnosis Date   Anemia    hx Thalassemia minor anemia   Asthma    Cancer ([place])    melanoma on back   Chronic back pain    ESI/uses BC powders   Depression    GERD (gastroesophageal reflux disease)    Headache(784.0)    Hypertension    Hypothyroidism    Peripheral vascular disease (HCC)    legs   Sepsis ([place]) 01/27/2014   Shortness of breath    Tendinitis of both rotator cuffs     Surgical History: Past Surgical History:  Procedure Laterality Date   ABDOMINAL HYSTERECTOMY     vagina   ANTERIOR CERVICAL DECOMP/DISCECTOMY FUSION N/A 05/01/2013   Procedure: ANTERIOR CERVICAL DECOMPRESSION/DISCECTOMY FUSION 3 LEVELS;  Surgeon: Elaina Hoops, MD;  Location: [facility];  Service: Neurosurgery;  Laterality: N/A;  ANTERIOR CERVICAL DECOMPRESSION/DISCECTOMY FUSION 3 LEVELS   APPENDECTOMY     BLADDER REPAIR     after hysterectomy same day   EYE SURGERY Bilateral    cataracts   TONSILLECTOMY     TUBAL LIGATION      Home  Medications:  Allergies as of 06/15/2020      Reactions   Ciprofloxacin Swelling, Rash   Hydrocodone    hallucinations   Oxycodone    hallucinations   Zoloft [sertraline Hcl]    Burns stomach   Sertraline Other (See Comments)   "Burns stomach"   Sulfa Antibiotics    Blisters in mouth   Tylenol [acetaminophen]    headaches   Ultram [tramadol]    headaches      Medication List       Accurate as of June 15, 2020  1:07 PM. If you have any questions, ask your nurse or doctor.        albuterol 108 (90 Base) MCG/ACT inhaler Commonly known as: VENTOLIN HFA Inhale 2 puffs into the lungs every 6 (six) hours as needed for wheezing.   amLODipine 5 MG tablet Commonly known as: NORVASC Take 2 tablets (10 mg total) by mouth daily.   DULOXETINE HCL PO Take by mouth daily.   DULoxetine 60 MG capsule Commonly known as: CYMBALTA   fexofenadine 180 MG tablet Commonly known as: ALLEGRA Take 180 mg by mouth daily.   furosemide 20 MG tablet Commonly known as: LASIX Take 1 tablet (20 mg total) by mouth daily.   GNP Calcium 1200 1200-1000 MG-UNIT Chew Chew 1,200 mg by mouth daily with breakfast. Take in combination with vitamin D and magnesium.  guaifenesin 100 MG/5ML syrup Commonly known as: ROBITUSSIN Take 200 mg by mouth 3 (three) times daily as needed for cough.   levothyroxine 75 MCG tablet Commonly known as: SYNTHROID Take 75 mcg by mouth daily before breakfast.   loperamide 2 MG capsule Commonly known as: IMODIUM Take 1 capsule (2 mg total) by mouth 2 (two) times daily as needed for diarrhea or loose stools.   LORazepam 0.5 MG tablet Commonly known as: ATIVAN Take by mouth.   magnesium oxide 400 MG tablet Commonly known as: MAG-OX Take 400 mg by mouth daily.   metoprolol 200 MG 24 hr tablet Commonly known as: TOPROL-XL Take 200 mg by mouth daily.   multivitamin with minerals Tabs tablet Take 1 tablet by mouth daily.   nitrofurantoin  (macrocrystal-monohydrate) 100 MG capsule Commonly known as: MACROBID   omeprazole 20 MG capsule Commonly known as: PRILOSEC Take 20 mg by mouth daily.   potassium chloride SA 20 MEQ tablet Commonly known as: KLOR-CON Take 2 tablets (40 mEq total) by mouth 2 (two) times daily with a meal.   solifenacin 5 MG tablet Commonly known as: VESICARE Take 5 mg by mouth daily.   vitamin C 500 MG tablet Commonly known as: ASCORBIC ACID Take 500 mg by mouth daily.   VITAMIN E PO Take 500 Units by mouth daily.   Vitamins/Minerals Tabs Take by mouth.       Allergies:  Allergies  Allergen Reactions   Ciprofloxacin Swelling and Rash   Hydrocodone     hallucinations   Oxycodone     hallucinations   Zoloft [Sertraline Hcl]     Burns stomach   Sertraline Other (See Comments)    "Burns stomach"   Sulfa Antibiotics     Blisters in mouth   Tylenol [Acetaminophen]     headaches   Ultram [Tramadol]     headaches    Family History: Family History  Problem Relation Age of Onset   Thalassemia Son    Thalassemia Paternal Aunt     Social History:  reports that she quit smoking about 25 years ago. Her smoking use included cigarettes. She has a 30.00 pack-year smoking history. She has never used smokeless tobacco. She reports that she does not drink alcohol and does not use drugs.  ROS:                                        Physical Exam: There were no vitals taken for this visit.    Laboratory Data: Lab Results  Component Value Date   WBC 12.6 (H) 02/08/2014   HGB 9.1 (L) 02/08/2014   HCT 27.8 (L) 02/08/2014   MCV 62.2 (L) 02/08/2014   PLT 382 02/08/2014    Lab Results  Component Value Date   CREATININE 0.98 01/01/2018    No results found for: PSA  No results found for: TESTOSTERONE  Lab Results  Component Value Date   HGBA1C 6.0 (H) 01/27/2014    Urinalysis    Component Value Date/Time   COLORURINE YELLOW 01/27/2014 [place] 01/27/2014 2253   [place] 01/27/2014 1557   LABSPEC 1.005 01/27/2014 2253   LABSPEC 1.025 01/27/2014 1557   PHURINE 5.0 01/27/2014 [place] 01/27/2014 [place] 01/27/2014 1557   [place] 01/27/2014 2253   BILIRUBINUR NEGATIVE 01/27/2014 2253   [place] 01/27/2014 1557  KETONESUR NEGATIVE 01/27/2014 2253   PROTEINUR NEGATIVE 01/27/2014 2253   UROBILINOGEN 0.2 01/27/2014 2253   NITRITE NEGATIVE 01/27/2014 2253   LEUKOCYTESUR NEGATIVE 01/27/2014 2253   LEUKOCYTESUR NEGATIVE 01/27/2014 1557    Pertinent Imaging:   Assessment & Plan: I educated the patient and her family that her symptoms may or may not be from a true infection and she could be colonized.  Having said that if she is getting recurrent bladder infections on prophylaxis and we should get a urine today by catheter and switch her to a different once a day antibiotic.  Measuring response of symptoms if a positive culture is treated is important.  Colonization was discussed with family.  She is allergic to sulfa so I thought I would see her back in 3 months on daily Keflex 250 mg 30 tablets x 11.  Patient agrees she does have chronic headaches and I think I understood the concept of potentially overtreating her.  She did get culture sent  1. Recurrent UTI  - Urinalysis, Complete   No follow-ups on file.  Reece Packer, MD  [facility] [address], [place], [place] [address] [phone]

## 2020-06-16 LAB — MICROSCOPIC EXAMINATION

## 2020-06-16 LAB — URINALYSIS, COMPLETE
Bilirubin, UA: NEGATIVE
Glucose, UA: NEGATIVE
Ketones, UA: NEGATIVE
Leukocytes,UA: NEGATIVE
Nitrite, UA: NEGATIVE
Protein,UA: NEGATIVE
RBC, UA: NEGATIVE
Specific Gravity, UA: 1.025 (ref 1.005–1.030)
Urobilinogen, Ur: 0.2 mg/dL (ref 0.2–1.0)
pH, UA: 7 (ref 5.0–7.5)

## 2020-06-18 LAB — CULTURE, URINE COMPREHENSIVE

## 2020-06-23 ENCOUNTER — Telehealth: Payer: Self-pay

## 2020-06-23 MED ORDER — AMOXICILLIN 500 MG PO CAPS: 500.0000 mg | ORAL_CAPSULE | Freq: Three times a day (TID) | ORAL | 0 refills | Status: AC

## 2020-06-23 NOTE — Telephone Encounter (Signed)
-----   Message from Chrystie Nose, [place] sent at 06/23/2020  8:20 AM EDT -----  ----- Message ----- From: Bjorn Loser, MD Sent: 06/23/2020   8:18 AM EDT To: Chrystie Nose, CMA  Ciprofloxacin 250 mg twice a day for 1 week; then go back on daily Keflex      ----- Message ----- From: Chrystie Nose, CMA Sent: 06/18/2020  12:23 PM EDT To: Bjorn Loser, MD   ----- Message ----- From: Interface, [facility] Lab Results In Sent: 06/16/2020   1:37 PM EDT To: Rowe Robert Clinical

## 2020-06-23 NOTE — Telephone Encounter (Signed)
Amoxil 500 mg tid for 7 days

## 2020-06-23 NOTE — Telephone Encounter (Signed)
Looks like patient has an allergy to Cipro, is there something else I can call in for her?

## 2020-06-23 NOTE — Telephone Encounter (Signed)
Amoxil 500 mg tid for 7 days as per Dr.Macdiarmid  Pt daughter aware. Sent meds to pharmacy

## 2020-09-14 ENCOUNTER — Encounter: Payer: Self-pay | Admitting: Urology

## 2020-09-14 ENCOUNTER — Ambulatory Visit (INDEPENDENT_AMBULATORY_CARE_PROVIDER_SITE_OTHER): Payer: Medicare Other | Admitting: Urology

## 2020-09-14 VITALS — BP 151/71 | HR 73 | Ht 62.0 in | Wt 167.0 lb

## 2020-09-14 DIAGNOSIS — N390 Urinary tract infection, site not specified: Secondary | ICD-10-CM | POA: Diagnosis not present

## 2020-09-14 DIAGNOSIS — N3020 Other chronic cystitis without hematuria: Secondary | ICD-10-CM

## 2020-09-14 MED ORDER — CEPHALEXIN 250 MG PO CAPS: 250.0000 mg | ORAL_CAPSULE | Freq: Every day | ORAL | 3 refills | Status: DC

## 2020-09-14 NOTE — Progress Notes (Signed)
09/14/2020 3:45 PM   Dossie Arbour [place] [birth year] [id number]  Referring provider: Marinda Elk, MD [facility]  [place] [address]  Chief Complaint  Patient presents with  . Recurrent UTI    follow up    HPI: I last saw the patient in [facility] June 2018.  She was on daily Macrodantin and Premarin cream for vaginal burning and urinary tract infections.  She was on Detrol and had failed medication such as Vesicare and oxybutynin.  We were doing percutaneous tibial nerve stimulation.  I think she only had one treatment  The patient has been having headaches and weakness in her legs and her daughter describes perhaps 3+ cultures with different bacteria each treated.  She is on daily Macrodantin.  Symptoms were somewhat nonspecific.  At baseline she still double pads for urge incontinence and has bedwetting  I do not have any cultures in the medical record  I educated the patient and her family that her symptoms may or may not be from a true infection and she could be colonized.  Having said that if she is getting recurrent bladder infections on prophylaxis and we should get a urine today by catheter and switch her to a different once a day antibiotic.  Measuring response of symptoms if a positive culture is treated is important.  Colonization was discussed with family.  She is allergic to sulfa so I thought I would see her back in 3 months on daily Keflex 250 mg 30 tablets x 11.  Patient agrees she does have chronic headaches and I think I understood the concept of potentially overtreating her.  She did get culture sent  TOday Frequency stable.  Last urine culture positive The patient fell on the last week.  Has not been treated for any bladder infections the last several months.  She is little bit disoriented at night but okay during the day.  Urine might be a little bit dark.  Urine positive sent for culture    PMH: Past Medical  History:  Diagnosis Date  . Anemia    hx Thalassemia minor anemia  . Asthma   . Cancer ([place])    melanoma on back  . Chronic back pain    ESI/uses BC powders  . Depression   . GERD (gastroesophageal reflux disease)   . Headache(784.0)   . Hypertension   . Hypothyroidism   . Peripheral vascular disease (HCC)    legs  . Sepsis ([place]) 01/27/2014  . Shortness of breath   . Tendinitis of both rotator cuffs     Surgical History: Past Surgical History:  Procedure Laterality Date  . ABDOMINAL HYSTERECTOMY     vagina  . ANTERIOR CERVICAL DECOMP/DISCECTOMY FUSION N/A 05/01/2013   Procedure: ANTERIOR CERVICAL DECOMPRESSION/DISCECTOMY FUSION 3 LEVELS;  Surgeon: Elaina Hoops, MD;  Location: [facility];  Service: Neurosurgery;  Laterality: N/A;  ANTERIOR CERVICAL DECOMPRESSION/DISCECTOMY FUSION 3 LEVELS  . APPENDECTOMY    . BLADDER REPAIR     after hysterectomy same day  . EYE SURGERY Bilateral    cataracts  . TONSILLECTOMY    . TUBAL LIGATION      Home Medications:  Allergies as of 09/14/2020      Reactions   Ciprofloxacin Swelling, Rash   Hydrocodone    hallucinations   Oxycodone    hallucinations   Zoloft [sertraline Hcl]    Burns stomach   Sertraline Other (See Comments)   "Burns stomach"  Sulfa Antibiotics    Blisters in mouth   Tylenol [acetaminophen]    headaches   Ultram [tramadol]    headaches      Medication List       Accurate as of September 14, 2020  3:45 PM. If you have any questions, ask your nurse or doctor.        albuterol 108 (90 Base) MCG/ACT inhaler Commonly known as: VENTOLIN HFA Inhale 2 puffs into the lungs every 6 (six) hours as needed for wheezing.   amLODipine 5 MG tablet Commonly known as: NORVASC Take 2 tablets (10 mg total) by mouth daily.   cephALEXin 250 MG capsule Commonly known as: Keflex Take 1 capsule (250 mg total) by mouth daily.   DULOXETINE HCL PO Take by mouth daily.   DULoxetine 60 MG capsule Commonly known as:  CYMBALTA   fexofenadine 180 MG tablet Commonly known as: ALLEGRA Take 180 mg by mouth daily.   furosemide 20 MG tablet Commonly known as: LASIX Take 1 tablet (20 mg total) by mouth daily.   GNP Calcium 1200 1200-1000 MG-UNIT Chew Chew 1,200 mg by mouth daily with breakfast. Take in combination with vitamin D and magnesium.   guaifenesin 100 MG/5ML syrup Commonly known as: ROBITUSSIN Take 200 mg by mouth 3 (three) times daily as needed for cough.   levothyroxine 75 MCG tablet Commonly known as: SYNTHROID Take 75 mcg by mouth daily before breakfast.   loperamide 2 MG capsule Commonly known as: IMODIUM Take 1 capsule (2 mg total) by mouth 2 (two) times daily as needed for diarrhea or loose stools.   LORazepam 0.5 MG tablet Commonly known as: ATIVAN Take by mouth.   magnesium oxide 400 MG tablet Commonly known as: MAG-OX Take 400 mg by mouth daily.   metoprolol 200 MG 24 hr tablet Commonly known as: TOPROL-XL Take 200 mg by mouth daily.   multivitamin with minerals Tabs tablet Take 1 tablet by mouth daily.   nitrofurantoin (macrocrystal-monohydrate) 100 MG capsule Commonly known as: MACROBID   omeprazole 20 MG capsule Commonly known as: PRILOSEC Take 20 mg by mouth daily.   potassium chloride SA 20 MEQ tablet Commonly known as: KLOR-CON Take 2 tablets (40 mEq total) by mouth 2 (two) times daily with a meal.   solifenacin 5 MG tablet Commonly known as: VESICARE Take 5 mg by mouth daily.   vitamin C 500 MG tablet Commonly known as: ASCORBIC ACID Take 500 mg by mouth daily.   VITAMIN E PO Take 500 Units by mouth daily.   Vitamins/Minerals Tabs Take by mouth.       Allergies:  Allergies  Allergen Reactions  . Ciprofloxacin Swelling and Rash  . Hydrocodone     hallucinations  . Oxycodone     hallucinations  . Zoloft [Sertraline Hcl]     Burns stomach  . Sertraline Other (See Comments)    "Burns stomach"  . Sulfa Antibiotics     Blisters in mouth   . Tylenol [Acetaminophen]     headaches  . Ultram [Tramadol]     headaches    Family History: Family History  Problem Relation Age of Onset  . Thalassemia Son   . Thalassemia Paternal Aunt     Social History:  reports that she quit smoking about 25 years ago. Her smoking use included cigarettes. She has a 30.00 pack-year smoking history. She has never used smokeless tobacco. She reports that she does not drink alcohol and does not use drugs.  ROS:  Physical Exam: There were no vitals taken for this visit.    Laboratory Data: Lab Results  Component Value Date   WBC 12.6 (H) 02/08/2014   HGB 9.1 (L) 02/08/2014   HCT 27.8 (L) 02/08/2014   MCV 62.2 (L) 02/08/2014   PLT 382 02/08/2014    Lab Results  Component Value Date   CREATININE 0.98 01/01/2018    No results found for: PSA  No results found for: TESTOSTERONE  Lab Results  Component Value Date   HGBA1C 6.0 (H) 01/27/2014    Urinalysis    Component Value Date/Time   COLORURINE YELLOW 01/27/2014 2253   APPEARANCEUR Hazy (A) 06/15/2020 1403   LABSPEC 1.005 01/27/2014 2253   LABSPEC 1.025 01/27/2014 1557   PHURINE 5.0 01/27/2014 2253   GLUCOSEU Negative 06/15/2020 1403   GLUCOSEU NEGATIVE 01/27/2014 1557   HGBUR NEGATIVE 01/27/2014 2253   BILIRUBINUR Negative 06/15/2020 1403   BILIRUBINUR NEGATIVE 01/27/2014 1557   KETONESUR NEGATIVE 01/27/2014 2253   PROTEINUR Negative 06/15/2020 1403   PROTEINUR NEGATIVE 01/27/2014 2253   UROBILINOGEN 0.2 01/27/2014 2253   NITRITE Negative 06/15/2020 1403   NITRITE NEGATIVE 01/27/2014 2253   LEUKOCYTESUR Negative 06/15/2020 1403   LEUKOCYTESUR NEGATIVE 01/27/2014 1557    Pertinent Imaging:   Assessment & Plan: Urine sent for culture.  Call if culture is positive.  If positive treat and go back on daily Keflex.  She could be chronically colonized.  This was again discussed with family.  Urine has been  strong smelling so I will treat if positive.  90x3 Keflex sent.  Reassess in 1 year  1. Recurrent UTI  - Urinalysis, Complete - CULTURE, URINE COMPREHENSIVE   No follow-ups on file.  Reece Packer, MD  [facility] [address], [place],  [address] [phone]

## 2020-09-15 LAB — URINALYSIS, COMPLETE
Bilirubin, UA: NEGATIVE
Glucose, UA: NEGATIVE
Ketones, UA: NEGATIVE
Leukocytes,UA: NEGATIVE
Nitrite, UA: POSITIVE — AB
Protein,UA: NEGATIVE
RBC, UA: NEGATIVE
Specific Gravity, UA: 1.015 (ref 1.005–1.030)
Urobilinogen, Ur: 0.2 mg/dL (ref 0.2–1.0)
pH, UA: 5.5 (ref 5.0–7.5)

## 2020-09-15 LAB — MICROSCOPIC EXAMINATION

## 2020-09-19 LAB — CULTURE, URINE COMPREHENSIVE

## 2020-09-21 ENCOUNTER — Telehealth: Payer: Self-pay

## 2020-09-21 MED ORDER — DOXYCYCLINE HYCLATE 100 MG PO CAPS: 100.0000 mg | ORAL_CAPSULE | Freq: Two times a day (BID) | ORAL | 0 refills | Status: AC

## 2020-09-21 NOTE — Telephone Encounter (Signed)
-----   Message from Chrystie Nose, [place] sent at 09/21/2020  8:26 AM EST -----  ----- Message ----- From: Bjorn Loser, MD Sent: 09/21/2020   8:21 AM EST To: Chrystie Nose, CMA   Doxycycline 100 mg bid for 7 days; then restart daily antibiotic  ----- Message ----- From: Chrystie Nose, CMA Sent: 09/21/2020   7:24 AM EST To: Bjorn Loser, MD   ----- Message ----- From: Interface, [facility] Lab Results In Sent: 09/15/2020   4:36 PM EST To: Rowe Robert Clinical

## 2020-09-21 NOTE — Telephone Encounter (Signed)
Sent medication to pharmacy. Patient daughter aware. Will stop Keflex and will restart after finishing doxycycline

## 2021-01-07 DIAGNOSIS — G8929 Other chronic pain: Secondary | ICD-10-CM | POA: Insufficient documentation

## 2021-01-07 NOTE — Progress Notes (Deleted)
Canceled day of visit.

## 2021-01-11 ENCOUNTER — Ambulatory Visit: Payer: Medicare Other | Admitting: Pain Medicine

## 2021-01-11 DIAGNOSIS — G894 Chronic pain syndrome: Secondary | ICD-10-CM

## 2021-01-11 DIAGNOSIS — G8929 Other chronic pain: Secondary | ICD-10-CM

## 2021-01-11 DIAGNOSIS — M47816 Spondylosis without myelopathy or radiculopathy, lumbar region: Secondary | ICD-10-CM

## 2021-01-11 DIAGNOSIS — R937 Abnormal findings on diagnostic imaging of other parts of musculoskeletal system: Secondary | ICD-10-CM

## 2021-01-11 DIAGNOSIS — M4310 Spondylolisthesis, site unspecified: Secondary | ICD-10-CM

## 2021-01-25 ENCOUNTER — Ambulatory Visit: Payer: Medicare Other | Admitting: Urology

## 2021-01-25 ENCOUNTER — Encounter: Payer: Self-pay | Admitting: Urology

## 2021-01-25 ENCOUNTER — Other Ambulatory Visit: Payer: Self-pay

## 2021-01-25 VITALS — BP 147/80 | HR 75 | Ht 62.0 in | Wt 163.0 lb

## 2021-01-25 DIAGNOSIS — N390 Urinary tract infection, site not specified: Secondary | ICD-10-CM

## 2021-01-25 MED ORDER — CEPHALEXIN 250 MG PO CAPS: 250.0000 mg | ORAL_CAPSULE | Freq: Every day | ORAL | 3 refills | Status: DC

## 2021-01-25 NOTE — Progress Notes (Signed)
PROVIDER NOTE: Information contained herein reflects review and annotations entered in association with encounter. Interpretation of such information and data should be left to medically-trained personnel. Information provided to patient can be located elsewhere in the medical record under "Patient Instructions". Document created using STT-dictation technology, any transcriptional errors that may result from process are unintentional.    Patient: Laura Frey  Service Category: E/M  Provider: Gaspar Cola, MD  DOB: [birth year]  DOS: 01/26/2021  Specialty: Interventional Pain Management  MRN: [id number]  Setting: Ambulatory outpatient  PCP: Marinda Elk, MD  Type: Established Patient    Referring Provider: Marinda Elk, MD  Location: Office  Delivery: Face-to-face     HPI  Ms. Laura Frey, a 87 y.o. year old female, is here today because of her Chronic bilateral low back pain without sciatica [M54.50, G89.29]. Ms. Laura Frey primary complain today is Back Pain Last encounter: My last encounter with her was on 01/11/2021. Pertinent problems: Ms. Laura Frey has Abnormal MRI, lumbar spine (2018); L2 compression fracture; H/O cervical spine surgery (C4-C7 ACDF); Abnormal MRI, cervical spine (2014); Chronic low back pain (1ry area of Pain) (Bilateral) (R>L) w/ sciatica (Bilateral); Lumbar facet joint osteoarthritis (Bilateral); Lumbar facet hypertrophy (Bilateral); Lumbar facet syndrome (Bilateral); Osteoarthritis of lumbar spine; Lumbar spondylosis; Cervical spondylosis; Cervical facet arthropathy (Bilateral); Chronic pain syndrome; Osteoarthritis; Lumbar Grade 1 Anterolisthesis of L3/L4; Lumbosacral Grade 1 Retrolisthesis of L5/S1; Lumbar lateral recess stenosis (Bilateral); Chronic lower extremity pain (2ry area of Pain) (Bilateral) (R>L); Chronic lower extremity radicular pain (Bilateral) (R>L); Chronic lumbar radiculitis (Right); DDD (degenerative disc disease), lumbar; DDD (degenerative  disc disease), cervical; Gait instability; Cervicalgia; Chronic upper back pain; Lower extremity weakness (Bilateral); Lower extremity weakness; Lower extremity numbness; Complaints of weakness of lower extremity; Osteopenia of lumbar spine; Spondylosis without myelopathy or radiculopathy, lumbar region; and Chronic low back pain (1ry area of Pain) (Bilateral) (R>L) w/o sciatica on their pertinent problem list. Pain Assessment: Severity of Chronic pain is reported as a 2 /10. Location: Back Lower/down leg bilateral to feet. Onset: More than a month ago. Quality: Aching,Constant,Burning. Timing: Constant. Modifying factor(s): rubs icy hot, rest. Vitals:  height is '5\' 2"'  (1.575 m) and weight is 161 lb (73 kg). Her temperature is 97.2 F (36.2 C) (abnormal). Her blood pressure is 123/66 and her pulse is 76.   Reason for encounter: follow-up evaluation.  This patient was last seen on 10/30/2018 at which time we did a diagnostic bilateral lumbar facet block #2 under fluoroscopic guidance and IV sedation, but the patient did not come back for follow-up evaluation.  The patient returns to the clinic today indicating having had 2 recent falls.  One occurred at [facility] where she got hit by the automatic door that seem to be malfunctioning.  This happened around 09/21/2020.  She did not go to the hospital for that incident.  In addition, around December 2021 she also had a fall at home, but I again she did not go to the hospital because she did not think that it was a big deal.  Apparently she lost her balance.  The last time that we saw this patient was approximately 2 years ago redact, right at the time that the Covid pandemic started.  We had done 2 diagnostic bilateral lumbar facet blocks but we did not get follow-up from the second 1 since the quarantine started and we did not see the patient again until today.  At that time, our goal was to see how  she was doing with the lumbar facet blocks and if the benefit  from them did not last long, we would be considering radiofrequency ablation.  This is still the plan, but at this point, we also have to update her lumbar x-rays and MRIs.  I will be ordering an x-ray on flexion and extension of the lumbar spine since she has an anterolisthesis.  She also has had a couple of falls and she has a history of vertebral body fractures.  In a similar way, I will be evaluating the cervical spine since she is complaining about neck pain with a lot of "cracking and popping" sounds when she moves her neck.  Post-Procedure Evaluation  Procedure (10/30/2018): Diagnostic bilateral lumbar facet block #2 under fluoroscopic guidance and IV sedation Pre-procedure pain level: 7/10 Post-procedure: 0/10 (100% relief)  Sedation: Sedation provided.  Effectiveness during initial hour after procedure(Ultra-Short Term Relief): 100 %.  Local anesthetic used: Long-acting (4-6 hours) Effectiveness: Defined as any analgesic benefit obtained secondary to the administration of local anesthetics. This carries significant diagnostic value as to the etiological location, or anatomical origin, of the pain. Duration of benefit is expected to coincide with the duration of the local anesthetic used.  Effectiveness during initial 4-6 hours after procedure(Short-Term Relief): 100 %.  Long-term benefit: Defined as any relief past the pharmacologic duration of the local anesthetics.  Effectiveness past the initial 6 hours after procedure(Long-Term Relief): 50 % (2 + months).  Current benefits: Defined as benefit that persist at this time.   Analgesia:  The patient indicated that the benefit that she attained from the lumbar facet blocks was significant to the point where she still remembers, 2 years later. Function: Ms. Laura Frey reports improvement in function, unfortunately he has gone back to baseline. ROM: Ms. Laura Frey reports improvement in ROM, in a similar manner and has gone back to  baseline.  Pharmacotherapy Assessment   Analgesic:  No opioid analgesics prescribed by our practice.   Monitoring: [place] PMP: PDMP reviewed during this encounter.       Pharmacotherapy: No side-effects or adverse reactions reported. Compliance: No problems identified. Effectiveness: Clinically acceptable.  Ignatius Specking, RN  01/26/2021  2:28 PM  Sign when Signing Visit Safety precautions to be maintained throughout the outpatient stay will include: orient to surroundings, keep bed in low position, maintain call bell within reach at all times, provide assistance with transfer out of bed and ambulation.     UDS: No results found for: SUMMARY   ROS  Constitutional: Denies any fever or chills Gastrointestinal: No reported hemesis, hematochezia, vomiting, or acute GI distress Musculoskeletal: Denies any acute onset joint swelling, redness, loss of ROM, or weakness Neurological: No reported episodes of acute onset apraxia, aphasia, dysarthria, agnosia, amnesia, paralysis, loss of coordination, or loss of consciousness  Medication Review  DULoxetine, LORazepam, Vitamin E, Vitamins/Minerals, albuterol, amLODipine, cephALEXin, fexofenadine, fosfomycin, furosemide, guaifenesin, levothyroxine, loperamide, magnesium oxide, metoprolol, multivitamin with minerals, omeprazole, potassium chloride SA, solifenacin, and vitamin C  History Review  Allergy: Ms. Duncombe is allergic to ciprofloxacin, hydrocodone, oxycodone, zoloft [sertraline hcl], sertraline, sulfa antibiotics, tylenol [acetaminophen], and ultram [tramadol]. Drug: Ms. Bertz  reports no history of drug use. Alcohol:  reports no history of alcohol use. Tobacco:  reports that she quit smoking about 25 years ago. Her smoking use included cigarettes. She has a 30.00 pack-year smoking history. She has never used smokeless tobacco. Social: Ms. Lites  reports that she quit smoking about 25 years ago. Her smoking use included  cigarettes. She has a  30.00 pack-year smoking history. She has never used smokeless tobacco. She reports that she does not drink alcohol and does not use drugs. Medical:  has a past medical history of Anemia, Asthma, Cancer ([place]), Chronic back pain, Depression, GERD (gastroesophageal reflux disease), Headache(784.0), Hypertension, Hypothyroidism, Peripheral vascular disease ([place]), Sepsis ([place]) (01/27/2014), Shortness of breath, Tendinitis of both rotator cuffs, and UTI (urinary tract infection). Surgical: Ms. Strollo  has a past surgical history that includes Tubal ligation; Appendectomy; Tonsillectomy; Eye surgery (Bilateral); Abdominal hysterectomy; Bladder repair; and Anterior cervical decomp/discectomy fusion (N/A, 05/01/2013). Family: family history includes Thalassemia in her paternal aunt and son.  Laboratory Chemistry Profile   Renal Lab Results  Component Value Date   BUN 19 01/01/2018   CREATININE 0.98 01/01/2018   BCR 19 01/01/2018   GFRAA 62 01/01/2018   GFRNONAA 54 (L) 01/01/2018     Hepatic Lab Results  Component Value Date   AST 23 01/01/2018   ALT 13 02/05/2014   ALBUMIN 4.5 01/01/2018   ALKPHOS 91 01/01/2018     Electrolytes Lab Results  Component Value Date   NA 139 01/01/2018   K 4.2 01/01/2018   CL 96 01/01/2018   CALCIUM 9.3 01/01/2018   MG 2.2 01/01/2018     Bone Lab Results  Component Value Date   25OHVITD1 29 (L) 01/01/2018   25OHVITD2 <1.0 01/01/2018   25OHVITD3 28 01/01/2018     Inflammation (CRP: Acute Phase) (ESR: Chronic Phase) Lab Results  Component Value Date   CRP 1.0 01/01/2018   ESRSEDRATE 12 01/01/2018   LATICACIDVEN 1.3 01/29/2014       Note: Above Lab results reviewed.  Recent Imaging Review  DG C-Arm 1-60 Min-No Report Fluoroscopy was utilized by the requesting physician.  No radiographic  interpretation.  Note: Reviewed        Physical Exam  General appearance: Well nourished, well developed, and well hydrated. In no apparent acute  distress Mental status: Alert, oriented x 3 (person, place, & time)       Respiratory: No evidence of acute respiratory distress Eyes: PERLA Vitals: BP 123/66   Pulse 76   Temp (!) 97.2 F (36.2 C)   Ht '5\' 2"'  (1.575 m)   Wt 161 lb (73 kg)   BMI 29.45 kg/m  BMI: Estimated body mass index is 29.45 kg/m as calculated from the following:   Height as of this encounter: '5\' 2"'  (1.575 m).   Weight as of this encounter: 161 lb (73 kg). Ideal: Ideal body weight: 50.1 kg (110 lb 7.2 oz) Adjusted ideal body weight: 59.3 kg (130 lb 10.7 oz)  Assessment   Status Diagnosis  Reoccurring Worsened Persistent 1. Chronic low back pain (1ry area of Pain) (Bilateral) (R>L) w/o sciatica   2. Lumbar facet syndrome (Bilateral)   3. Chronic lower extremity pain (2ry area of Pain) (Bilateral) (R>L)   4. L2 compression fracture   5. Lumbar Grade 1 Anterolisthesis of L3/L4   6. Lumbosacral Grade 1 Retrolisthesis of L5/S1   7. Chronic pain syndrome   8. Other intervertebral disc degeneration, lumbar region   9. Cervicalgia   10. Cervical facet arthropathy (Bilateral)   11. Cervical spondylosis   12. Chronic upper back pain      Updated Problems: No problems updated.  Plan of Care  Problem-specific:  No problem-specific Assessment & Plan notes found for this encounter.  Ms. Zamya Culhane has a current medication list which includes the following long-term medication(s):  albuterol, amlodipine, duloxetine, fexofenadine, furosemide, metoprolol, omeprazole, and potassium chloride sa.  Pharmacotherapy (Medications Ordered): No orders of the defined types were placed in this encounter.  Orders:  Orders Placed This Encounter  Procedures  . LUMBAR FACET(MEDIAL BRANCH NERVE BLOCK) MBNB    Standing Status:   Future    Standing Expiration Date:   02/25/2021    Scheduling Instructions:     Procedure: Lumbar facet block (AKA.: Lumbosacral medial branch nerve block)     Side: Bilateral     Level:  L3-4, L4-5, & L5-S1 Facets (L2, L3, L4, L5, & S1 Medial Branch Nerves)     Sedation: Patient's choice.     Timeframe: ASAA    Order Specific Question:   Where will this procedure be performed?    Answer:   [facility]  . DG Lumbar Spine Complete W/Bend    Patient presents with axial pain with possible radicular component.  In addition to any acute findings, please report on:  1. Facet (Zygapophyseal) joint DJD (Hypertrophy, space narrowing, subchondral sclerosis, and/or osteophyte formation) 2. DDD and/or IVDD (Loss of disc height, desiccation or "Black disc disease") 3. Pars defects 4. Spondylolisthesis, spondylosis, and/or spondyloarthropathies (include Degree/Grade of displacement in mm) 5. Vertebral body Fractures, including age (old, new/acute) 37. Modic Type Changes 7. Demineralization 8. Bone pathology 9. Central, Lateral Recess, and/or Foraminal Stenosis (include AP diameter of stenosis in mm) 10. Surgical changes (hardware type, status, and presence of fibrosis)  NOTE: Please specify level(s) and laterality. If applicable: Please indicate ROM and/or evidence of instability (>52m displacement between flexion and extension views)    Standing Status:   Future    Standing Expiration Date:   02/25/2021    Scheduling Instructions:     Imaging must be done as soon as possible. Inform patient that order will expire within 30 days and I will not renew it.    Order Specific Question:   Reason for Exam (SYMPTOM  OR DIAGNOSIS REQUIRED)    Answer:   Low back pain    Order Specific Question:   Preferred imaging location?    Answer:   [facility]    Order Specific Question:   Call Results- Best Contact Number?    Answer:   [phone]([facility]    Order Specific Question:   Radiology Contrast Protocol - do NOT remove file path    Answer:   \\[email or web address]    Order Specific Question:   Release to patient    Answer:    Immediate  . DG Cervical Spine With Flex & Extend    Patient presents with axial pain with possible radicular component.  Please evaluate for any evidence of cervical spine instability. Describe the presence of any spondylolisthesis (Antero- or retrolisthesis). If present, provide displacement "Grade" and measurement in cm. Please describe presence and specific location (Level & Laterality) of any signs of  osteoarthritis, zygapophyseal (Facet) joints DJD (including decreased joint space and/or osteophytosis), DDD, Foraminal narrowing, as well as any sclerosis and/or cyst formation. Please comment on ROM. In addition to any acute findings, please report on:  1. Facet (Zygapophyseal) joint DJD (Hypertrophy, space narrowing, subchondral sclerosis, and/or osteophyte formation) 2. DDD and/or IVDD (Loss of disc height, desiccation or "Black disc disease") 3. Pars defects 4. Spondylolisthesis, spondylosis, and/or spondyloarthropathies (include Degree/Grade of displacement in mm) 5. Vertebral body Fractures, including age (old, new/acute) 647 Modic Type Changes 7. Demineralization 8. Bone pathology 9. Central, Lateral Recess, and/or Foraminal Stenosis (include AP diameter  of stenosis in mm) 10. Surgical changes (hardware type, status, and presence of fibrosis) NOTE: Please specify level(s) and laterality. If applicable: Please indicate ROM and/or evidence of instability (>34m displacement between flexion and extension views)    Standing Status:   Future    Standing Expiration Date:   02/25/2021    Scheduling Instructions:     Imaging must be done as soon as possible. Inform patient that order will expire within 30 days and I will not renew it.    Order Specific Question:   Reason for Exam (SYMPTOM  OR DIAGNOSIS REQUIRED)    Answer:   Cervicalgia    Order Specific Question:   Preferred imaging location?    Answer:   [facility]    Order Specific Question:   Call Results- Best Contact Number?     Answer:   [phone]([facility]    Order Specific Question:   Radiology Contrast Protocol - do NOT remove file path    Answer:   \\[email or web address]    Order Specific Question:   Release to patient    Answer:   Immediate  . MR LUMBAR SPINE WO CONTRAST    Patient presents with axial pain with possible radicular component.  In addition to any acute findings, please report on:  1. Facet (Zygapophyseal) joint DJD (Hypertrophy, space narrowing, subchondral sclerosis, and/or osteophyte formation) 2. DDD and/or IVDD (Loss of disc height, desiccation or "Black disc disease") 3. Pars defects 4. Spondylolisthesis, spondylosis, and/or spondyloarthropathies (include Degree/Grade of displacement in mm) 5. Vertebral body Fractures, including age (old, new/acute) 649 Modic Type Changes 7. Demineralization 8. Bone pathology 9. Central, Lateral Recess, and/or Foraminal Stenosis (include AP diameter of stenosis in mm) 10. Surgical changes (hardware type, status, and presence of fibrosis)  NOTE: Please specify level(s) and laterality.    Standing Status:   Future    Standing Expiration Date:   02/25/2021    Scheduling Instructions:     Imaging must be done as soon as possible. Inform patient that order will expire within 30 days and I will not renew it.    Order Specific Question:   What is the patient's sedation requirement?    Answer:   No Sedation    Order Specific Question:   Does the patient have a pacemaker or implanted devices?    Answer:   No    Order Specific Question:   Preferred imaging location?    Answer:   [facility] (table limit-350lbs)    Order Specific Question:   Call Results- Best Contact Number?    Answer:   [phone]([facility]    Order Specific Question:   Radiology Contrast Protocol - do NOT remove file path    Answer:   \\[email or web address]   Follow-up plan:   Return for  Procedure (w/ sedation): (B) L-FCT BLK #3.     Interventional Therapies  Risk  Complexity Considerations:   Estimated body mass index is 29.45 kg/m as calculated from the following:   Height as of this encounter: '5\' 2"'  (1.575 m).   Weight as of this encounter: 161 lb (73 kg). Patient is initially referred as a  "Fast-Track".  Poor compliance with follow-up evaluations after procedures.  (02/27/2018; 10/30/2018)    Planned  Pending:   Pending further evaluation   Under consideration:   Diagnostic right L3-4 LESI#3 Diagnostic bilateral L3, L4 and/or L5 TESI Diagnostic bilateral L2-3 transforaminal ESI Diagnostic bilateral lumbar facet MBB #3 Diagnostic cervical facet MBB Diagnostic  midlineCervicalESI   Completed:   Diagnostic bilateral lumbar facet MBB x2 (10/30/2018) (100/100/100/25) Therapeutic midline-right L2-3 LESI x2 (07/17/2018) (100/100/100/75-100) (100/100/90/50)  Therapeutic right L3-4 LESI x2 (02/27/2018) (80/80/80/>50)   Therapeutic  Palliative (PRN) options:   Palliativeright L3-4 LESI #3    Recent Visits No visits were found meeting these conditions. Showing recent visits within past 90 days and meeting all other requirements Today's Visits Date Type Provider Dept  01/26/21 Office Visit Milinda Pointer, MD [facility]  Showing today's visits and meeting all other requirements Future Appointments No visits were found meeting these conditions. Showing future appointments within next 90 days and meeting all other requirements  I discussed the assessment and treatment plan with the patient. The patient was provided an opportunity to ask questions and all were answered. The patient agreed with the plan and demonstrated an understanding of the instructions.  Patient advised to call back or seek an in-person evaluation if the symptoms or condition worsens.  Duration of encounter: 30 minutes.  Note by: Gaspar Cola, MD Date: 01/26/2021;  Time: 2:47 PM

## 2021-01-25 NOTE — Progress Notes (Signed)
01/25/2021 9:48 AM   Laura Frey Apr 08, 1932 [id number]  Referring provider: Marinda Elk, MD [facility]  [place] [address]  No chief complaint on file.   HPI: I last saw the patient in [facility] June 2018.  She was on daily Macrodantin and Premarin cream for vaginal burning and urinary tract infections.  She was on Detrol and had failed medication such as Vesicare and oxybutynin.  We were doing percutaneous tibial nerve stimulation.  I think she only had one treatment  The patient has been having headaches and weakness in her legs and her daughter describes perhaps 3+ cultures with different bacteria each treated.  She is on daily Macrodantin.  Symptoms were somewhat nonspecific.  At baseline she still double pads for urge incontinence and has bedwetting  I do not have any cultures in the medical record  I educated the patient and her family that her symptoms may or may not be from a true infection and she could be colonized.  Having said that if she is getting recurrent bladder infections on prophylaxis and we should get a urine today by catheter and switch her to a different once a day antibiotic.  Measuring response of symptoms if a positive culture is treated is important.  Colonization was discussed with family.  She is allergic to sulfa so I thought I would see her back in 3 months on daily Keflex 250 mg 30 tablets x 11.  Patient agrees she does have chronic headaches and I think I understood the concept of potentially overtreating her.  She did get culture sent  Last urine culture positive The patient fell on the last week.  Has not been treated for any bladder infections the last several months.  She is little bit disoriented at night but okay during the day.  Urine might be a little bit dark.  Urine sent for culture.  Call if culture is positive.  If positive treat and go back on daily Keflex.  She could be chronically  colonized.  This was again discussed with family.  Urine has been strong smelling so I will treat if positive.  90x3 Keflex sent.  Reassess in 1 year  Today Frequency stable.  Last culture positive Still on once a day Keflex.  Recently treated for an infection with fosfomycin 1 dose.  She has a lot of allergies. It was difficult for me to review the culture in the medical record.  PMH: Past Medical History:  Diagnosis Date  . Anemia    hx Thalassemia minor anemia  . Asthma   . Cancer ([place])    melanoma on back  . Chronic back pain    ESI/uses BC powders  . Depression   . GERD (gastroesophageal reflux disease)   . Headache(784.0)   . Hypertension   . Hypothyroidism   . Peripheral vascular disease ([facility])    legs  . Sepsis ([place]) 01/27/2014  . Shortness of breath   . Tendinitis of both rotator cuffs     Surgical History: Past Surgical History:  Procedure Laterality Date  . ABDOMINAL HYSTERECTOMY     vagina  . ANTERIOR CERVICAL DECOMP/DISCECTOMY FUSION N/A 05/01/2013   Procedure: ANTERIOR CERVICAL DECOMPRESSION/DISCECTOMY FUSION 3 LEVELS;  Surgeon: Elaina Hoops, MD;  Location: [facility];  Service: Neurosurgery;  Laterality: N/A;  ANTERIOR CERVICAL DECOMPRESSION/DISCECTOMY FUSION 3 LEVELS  . APPENDECTOMY    . BLADDER REPAIR     after hysterectomy same day  .  EYE SURGERY Bilateral    cataracts  . TONSILLECTOMY    . TUBAL LIGATION      Home Medications:  Allergies as of 01/25/2021      Reactions   Ciprofloxacin Swelling, Rash   Hydrocodone    hallucinations   Oxycodone    hallucinations   Zoloft [sertraline Hcl]    Burns stomach   Sertraline Other (See Comments)   "Burns stomach"   Sulfa Antibiotics    Blisters in mouth   Tylenol [acetaminophen]    headaches   Ultram [tramadol]    headaches      Medication List       Accurate as of January 25, 2021  9:48 AM. If you have any questions, ask your nurse or doctor.        albuterol 108 (90 Base) MCG/ACT  inhaler Commonly known as: VENTOLIN HFA Inhale 2 puffs into the lungs every 6 (six) hours as needed for wheezing.   amLODipine 5 MG tablet Commonly known as: NORVASC Take 2 tablets (10 mg total) by mouth daily.   cephALEXin 250 MG capsule Commonly known as: Keflex Take 1 capsule (250 mg total) by mouth daily.   DULoxetine 60 MG capsule Commonly known as: CYMBALTA   fexofenadine 180 MG tablet Commonly known as: ALLEGRA Take 180 mg by mouth daily.   furosemide 20 MG tablet Commonly known as: LASIX Take 1 tablet (20 mg total) by mouth daily.   GNP Calcium 1200 1200-1000 MG-UNIT Chew Chew 1,200 mg by mouth daily with breakfast. Take in combination with vitamin D and magnesium.   guaifenesin 100 MG/5ML syrup Commonly known as: ROBITUSSIN Take 200 mg by mouth 3 (three) times daily as needed for cough.   levothyroxine 75 MCG tablet Commonly known as: SYNTHROID Take 75 mcg by mouth daily before breakfast.   loperamide 2 MG capsule Commonly known as: IMODIUM Take 1 capsule (2 mg total) by mouth 2 (two) times daily as needed for diarrhea or loose stools.   LORazepam 0.5 MG tablet Commonly known as: ATIVAN Take by mouth.   magnesium oxide 400 MG tablet Commonly known as: MAG-OX Take 400 mg by mouth daily.   metoprolol 200 MG 24 hr tablet Commonly known as: TOPROL-XL Take 200 mg by mouth daily.   multivitamin with minerals Tabs tablet Take 1 tablet by mouth daily.   omeprazole 20 MG capsule Commonly known as: PRILOSEC Take 20 mg by mouth daily.   potassium chloride SA 20 MEQ tablet Commonly known as: KLOR-CON Take 2 tablets (40 mEq total) by mouth 2 (two) times daily with a meal.   solifenacin 5 MG tablet Commonly known as: VESICARE Take 5 mg by mouth daily.   vitamin C 500 MG tablet Commonly known as: ASCORBIC ACID Take 500 mg by mouth daily.   VITAMIN E PO Take 500 Units by mouth daily.   Vitamins/Minerals Tabs Take by mouth.       Allergies:   Allergies  Allergen Reactions  . Ciprofloxacin Swelling and Rash  . Hydrocodone     hallucinations  . Oxycodone     hallucinations  . Zoloft [Sertraline Hcl]     Burns stomach  . Sertraline Other (See Comments)    "Burns stomach"  . Sulfa Antibiotics     Blisters in mouth  . Tylenol [Acetaminophen]     headaches  . Ultram [Tramadol]     headaches    Family History: Family History  Problem Relation Age of Onset  . Thalassemia Son   .  Thalassemia Paternal Aunt     Social History:  reports that she quit smoking about 25 years ago. Her smoking use included cigarettes. She has a 30.00 pack-year smoking history. She has never used smokeless tobacco. She reports that she does not drink alcohol and does not use drugs.  ROS:                                        Physical Exam: There were no vitals taken for this visit.  Constitutional:  Alert and oriented, No acute distress.   Laboratory Data: Lab Results  Component Value Date   WBC 12.6 (H) 02/08/2014   HGB 9.1 (L) 02/08/2014   HCT 27.8 (L) 02/08/2014   MCV 62.2 (L) 02/08/2014   PLT 382 02/08/2014    Lab Results  Component Value Date   CREATININE 0.98 01/01/2018    No results found for: PSA  No results found for: TESTOSTERONE  Lab Results  Component Value Date   HGBA1C 6.0 (H) 01/27/2014    Urinalysis    Component Value Date/Time   COLORURINE YELLOW 01/27/2014 2253   APPEARANCEUR Hazy (A) 09/14/2020 1543   LABSPEC 1.005 01/27/2014 2253   LABSPEC 1.025 01/27/2014 1557   PHURINE 5.0 01/27/2014 2253   GLUCOSEU Negative 09/14/2020 1543   GLUCOSEU NEGATIVE 01/27/2014 1557   HGBUR NEGATIVE 01/27/2014 2253   BILIRUBINUR Negative 09/14/2020 1543   BILIRUBINUR NEGATIVE 01/27/2014 1557   KETONESUR NEGATIVE 01/27/2014 2253   PROTEINUR Negative 09/14/2020 1543   PROTEINUR NEGATIVE 01/27/2014 2253   UROBILINOGEN 0.2 01/27/2014 2253   NITRITE Positive (A) 09/14/2020 1543   NITRITE  NEGATIVE 01/27/2014 2253   LEUKOCYTESUR Negative 09/14/2020 1543   LEUKOCYTESUR NEGATIVE 01/27/2014 1557    Pertinent Imaging:   Assessment & Plan: Stay on daily Keflex.  Call with culture results.  She has foul-smelling urine.  The fosfomycin was $100 so we chose to wait for the culture to come back.  The plan is to stay on daily Keflex and treat symptomatic infections as needed.  Unfortunate the prophylaxis is not worked out well  There are no diagnoses linked to this encounter.  No follow-ups on file.  Reece Packer, MD  [facility] [address], [place], [place] [address] [phone]

## 2021-01-26 ENCOUNTER — Encounter: Payer: Self-pay | Admitting: Pain Medicine

## 2021-01-26 ENCOUNTER — Ambulatory Visit: Payer: Medicare Other | Attending: Pain Medicine | Admitting: Pain Medicine

## 2021-01-26 VITALS — BP 123/66 | HR 76 | Temp 97.2°F | Ht 62.0 in | Wt 161.0 lb

## 2021-01-26 DIAGNOSIS — M79605 Pain in left leg: Secondary | ICD-10-CM | POA: Diagnosis present

## 2021-01-26 DIAGNOSIS — M4856XS Collapsed vertebra, not elsewhere classified, lumbar region, sequela of fracture: Secondary | ICD-10-CM | POA: Diagnosis not present

## 2021-01-26 DIAGNOSIS — M5136 Other intervertebral disc degeneration, lumbar region: Secondary | ICD-10-CM | POA: Diagnosis present

## 2021-01-26 DIAGNOSIS — M47812 Spondylosis without myelopathy or radiculopathy, cervical region: Secondary | ICD-10-CM

## 2021-01-26 DIAGNOSIS — M47816 Spondylosis without myelopathy or radiculopathy, lumbar region: Secondary | ICD-10-CM | POA: Diagnosis not present

## 2021-01-26 DIAGNOSIS — M542 Cervicalgia: Secondary | ICD-10-CM

## 2021-01-26 DIAGNOSIS — M545 Low back pain, unspecified: Secondary | ICD-10-CM | POA: Insufficient documentation

## 2021-01-26 DIAGNOSIS — G894 Chronic pain syndrome: Secondary | ICD-10-CM | POA: Diagnosis present

## 2021-01-26 DIAGNOSIS — G8929 Other chronic pain: Secondary | ICD-10-CM | POA: Diagnosis present

## 2021-01-26 DIAGNOSIS — M549 Dorsalgia, unspecified: Secondary | ICD-10-CM | POA: Diagnosis present

## 2021-01-26 DIAGNOSIS — M4310 Spondylolisthesis, site unspecified: Secondary | ICD-10-CM

## 2021-01-26 DIAGNOSIS — M79604 Pain in right leg: Secondary | ICD-10-CM | POA: Insufficient documentation

## 2021-01-26 DIAGNOSIS — M51369 Other intervertebral disc degeneration, lumbar region without mention of lumbar back pain or lower extremity pain: Secondary | ICD-10-CM

## 2021-01-26 LAB — MICROSCOPIC EXAMINATION: RBC, Urine: NONE SEEN /hpf (ref 0–2)

## 2021-01-26 LAB — URINALYSIS, COMPLETE
Bilirubin, UA: NEGATIVE
Glucose, UA: NEGATIVE
Ketones, UA: NEGATIVE
Nitrite, UA: POSITIVE — AB
Protein,UA: NEGATIVE
RBC, UA: NEGATIVE
Specific Gravity, UA: 1.015 (ref 1.005–1.030)
Urobilinogen, Ur: 0.2 mg/dL (ref 0.2–1.0)
pH, UA: 5.5 (ref 5.0–7.5)

## 2021-01-26 NOTE — Patient Instructions (Addendum)
____________________________________________________________________________________________  Preparing for Procedure with Sedation  Procedure appointments are limited to planned procedures: . No Prescription Refills. . No disability issues will be discussed. . No medication changes will be discussed.  Instructions: . Oral Intake: Do not eat or drink anything for at least 8 hours prior to your procedure. (Exception: Blood Pressure Medication. See below.) . Transportation: Unless otherwise stated by your physician, you may drive yourself after the procedure. . Blood Pressure Medicine: Do not forget to take your blood pressure medicine with a sip of water the morning of the procedure. If your Diastolic (lower reading)is above 100 mmHg, elective cases will be cancelled/rescheduled. . Blood thinners: These will need to be stopped for procedures. Notify our staff if you are taking any blood thinners. Depending on which one you take, there will be specific instructions on how and when to stop it. . Diabetics on insulin: Notify the staff so that you can be scheduled 1st case in the morning. If your diabetes requires high dose insulin, take only  of your normal insulin dose the morning of the procedure and notify the staff that you have done so. . Preventing infections: Shower with an antibacterial soap the morning of your procedure. . Build-up your immune system: Take 1000 mg of Vitamin C with every meal (3 times a day) the day prior to your procedure. . Antibiotics: Inform the staff if you have a condition or reason that requires you to take antibiotics before dental procedures. . Pregnancy: If you are pregnant, call and cancel the procedure. . Sickness: If you have a cold, fever, or any active infections, call and cancel the procedure. . Arrival: You must be in the facility at least 30 minutes prior to your scheduled procedure. . Children: Do not bring children with you. . Dress appropriately:  Bring dark clothing that you would not mind if they get stained. . Valuables: Do not bring any jewelry or valuables.  Reasons to call and reschedule or cancel your procedure: (Following these recommendations will minimize the risk of a serious complication.) . Surgeries: Avoid having procedures within 2 weeks of any surgery. (Avoid for 2 weeks before or after any surgery). . Flu Shots: Avoid having procedures within 2 weeks of a flu shots or . (Avoid for 2 weeks before or after immunizations). . Barium: Avoid having a procedure within 7-10 days after having had a radiological study involving the use of radiological contrast. (Myelograms, Barium swallow or enema study). . Heart attacks: Avoid any elective procedures or surgeries for the initial 6 months after a "Myocardial Infarction" (Heart Attack). . Blood thinners: It is imperative that you stop these medications before procedures. Let us know if you if you take any blood thinner.  . Infection: Avoid procedures during or within two weeks of an infection (including chest colds or gastrointestinal problems). Symptoms associated with infections include: Localized redness, fever, chills, night sweats or profuse sweating, burning sensation when voiding, cough, congestion, stuffiness, runny nose, sore throat, diarrhea, nausea, vomiting, cold or Flu symptoms, recent or current infections. It is specially important if the infection is over the area that we intend to treat. . Heart and lung problems: Symptoms that may suggest an active cardiopulmonary problem include: cough, chest pain, breathing difficulties or shortness of breath, dizziness, ankle swelling, uncontrolled high or unusually low blood pressure, and/or palpitations. If you are experiencing any of these symptoms, cancel your procedure and contact your primary care physician for an evaluation.  Remember:  Regular Business hours are:    Monday to Thursday 8:00 AM to 4:00 PM  Provider's  Schedule: Milinda Pointer, MD:  Procedure days: Tuesday and Thursday 7:30 AM to 4:00 PM  Gillis Santa, MD:  Procedure days: Monday and Wednesday 7:30 AM to 4:00 PM ____________________________________________________________________________________________   Preparing for your procedure (without sedation) Instructions: . Oral Intake: Do not eat or drink anything for at least 3 hours prior to your procedure. . Transportation: Unless otherwise stated by your physician, you may drive yourself after the procedure. . Blood Pressure Medicine: Take your blood pressure medicine with a sip of water the morning of the procedure. . Insulin: Take only  of your normal insulin dose. . Preventing infections: Shower with an antibacterial soap the morning of your procedure. . Build-up your immune system: Take 1000 mg of Vitamin C with every meal (3 times a day) the day prior to your procedure. . Pregnancy: If you are pregnant, call and cancel the procedure. . Sickness: If you have a cold, fever, or any active infections, call and cancel the procedure. . Arrival: You must be in the facility at least 30 minutes prior to your scheduled procedure. . Children: Do not bring any children with you. . Dress appropriately: Bring dark clothing that you would not mind if they get stained. . Valuables: Do not bring any jewelry or valuables. Procedure appointments are reserved for interventional treatments only. Marland Kitchen No Prescription Refills. . No medication changes will be discussed during procedure appointments. . No disability issues will be discussed. Marland Kitchen

## 2021-01-26 NOTE — Progress Notes (Signed)
Safety precautions to be maintained throughout the outpatient stay will include: orient to surroundings, keep bed in low position, maintain call bell within reach at all times, provide assistance with transfer out of bed and ambulation.  

## 2021-01-28 LAB — CULTURE, URINE COMPREHENSIVE

## 2021-02-01 ENCOUNTER — Telehealth: Payer: Self-pay

## 2021-02-01 MED ORDER — NITROFURANTOIN MONOHYD MACRO 100 MG PO CAPS: 100.0000 mg | ORAL_CAPSULE | Freq: Two times a day (BID) | ORAL | 0 refills | Status: DC

## 2021-02-01 NOTE — Telephone Encounter (Signed)
Pt aware and understanding. Sent medications to pharmacy.

## 2021-02-01 NOTE — Telephone Encounter (Signed)
-----   Message from Chrystie Nose, [place] sent at 02/01/2021 12:37 PM EDT -----  ----- Message ----- From: Bjorn Loser, MD Sent: 02/01/2021  11:44 AM EDT To: Chrystie Nose, CMA  Macrodantin 100 mg twice a day for 7 days Then go back on daily Keflex  ----- Message ----- From: Chrystie Nose, CMA Sent: 01/28/2021  12:23 PM EDT To: Bjorn Loser, MD   ----- Message ----- From: Interface, [facility] Lab Results In Sent: 01/26/2021  11:37 AM EDT To: Rowe Robert Clinical

## 2021-02-09 ENCOUNTER — Ambulatory Visit: Payer: Medicare Other | Admitting: Pain Medicine

## 2021-02-10 ENCOUNTER — Ambulatory Visit
Admission: RE | Admit: 2021-02-10 | Discharge: 2021-02-10 | Disposition: A | Discharge status: Home / Self Care | Payer: Medicare Other | Source: Home / Self Care | Attending: Pain Medicine | Admitting: Pain Medicine

## 2021-02-10 ENCOUNTER — Other Ambulatory Visit: Payer: Self-pay

## 2021-02-10 ENCOUNTER — Ambulatory Visit
Admission: RE | Admit: 2021-02-10 | Discharge: 2021-02-10 | Disposition: A | Discharge status: Home / Self Care | Payer: Medicare Other | Source: Ambulatory Visit | Attending: Pain Medicine | Admitting: Pain Medicine

## 2021-02-10 DIAGNOSIS — M47816 Spondylosis without myelopathy or radiculopathy, lumbar region: Secondary | ICD-10-CM | POA: Insufficient documentation

## 2021-02-10 DIAGNOSIS — M79604 Pain in right leg: Secondary | ICD-10-CM | POA: Insufficient documentation

## 2021-02-10 DIAGNOSIS — M4856XS Collapsed vertebra, not elsewhere classified, lumbar region, sequela of fracture: Secondary | ICD-10-CM | POA: Diagnosis present

## 2021-02-10 DIAGNOSIS — M4310 Spondylolisthesis, site unspecified: Secondary | ICD-10-CM | POA: Insufficient documentation

## 2021-02-10 DIAGNOSIS — M549 Dorsalgia, unspecified: Secondary | ICD-10-CM | POA: Diagnosis present

## 2021-02-10 DIAGNOSIS — M542 Cervicalgia: Secondary | ICD-10-CM | POA: Diagnosis present

## 2021-02-10 DIAGNOSIS — M5136 Other intervertebral disc degeneration, lumbar region: Secondary | ICD-10-CM | POA: Diagnosis present

## 2021-02-10 DIAGNOSIS — M47812 Spondylosis without myelopathy or radiculopathy, cervical region: Secondary | ICD-10-CM

## 2021-02-10 DIAGNOSIS — G8929 Other chronic pain: Secondary | ICD-10-CM

## 2021-02-10 DIAGNOSIS — M545 Low back pain, unspecified: Secondary | ICD-10-CM

## 2021-02-10 DIAGNOSIS — M79605 Pain in left leg: Secondary | ICD-10-CM | POA: Diagnosis present

## 2021-02-16 ENCOUNTER — Ambulatory Visit
Admission: RE | Admit: 2021-02-16 | Discharge: 2021-02-16 | Disposition: A | Discharge status: Home / Self Care | Payer: Medicare Other | Source: Ambulatory Visit | Attending: Pain Medicine | Admitting: Pain Medicine

## 2021-02-16 ENCOUNTER — Other Ambulatory Visit: Payer: Self-pay

## 2021-02-16 ENCOUNTER — Encounter: Payer: Self-pay | Admitting: Pain Medicine

## 2021-02-16 ENCOUNTER — Ambulatory Visit (HOSPITAL_BASED_OUTPATIENT_CLINIC_OR_DEPARTMENT_OTHER): Payer: Medicare Other | Admitting: Pain Medicine

## 2021-02-16 VITALS — BP 138/83 | HR 85 | Temp 96.8°F | Resp 19 | Ht 62.0 in | Wt 161.0 lb

## 2021-02-16 DIAGNOSIS — S32030S Wedge compression fracture of third lumbar vertebra, sequela: Secondary | ICD-10-CM | POA: Insufficient documentation

## 2021-02-16 DIAGNOSIS — M545 Low back pain, unspecified: Secondary | ICD-10-CM | POA: Insufficient documentation

## 2021-02-16 DIAGNOSIS — M47816 Spondylosis without myelopathy or radiculopathy, lumbar region: Secondary | ICD-10-CM

## 2021-02-16 DIAGNOSIS — M4310 Spondylolisthesis, site unspecified: Secondary | ICD-10-CM

## 2021-02-16 DIAGNOSIS — G8929 Other chronic pain: Secondary | ICD-10-CM | POA: Insufficient documentation

## 2021-02-16 DIAGNOSIS — M5136 Other intervertebral disc degeneration, lumbar region: Secondary | ICD-10-CM | POA: Insufficient documentation

## 2021-02-16 DIAGNOSIS — S32030A Wedge compression fracture of third lumbar vertebra, initial encounter for closed fracture: Secondary | ICD-10-CM | POA: Insufficient documentation

## 2021-02-16 MED ORDER — ROPIVACAINE HCL 2 MG/ML IJ SOLN
18.0000 mL | Freq: Once | INTRAMUSCULAR | Status: AC
Start: 2021-02-16 — End: 2021-02-16
  Administered 2021-02-16: 18 mL via PERINEURAL
  Filled 2021-02-16: qty 20

## 2021-02-16 MED ORDER — LACTATED RINGERS IV SOLN: 1000.0000 mL | Freq: Once | INTRAVENOUS | Status: DC

## 2021-02-16 MED ORDER — MIDAZOLAM HCL 5 MG/5ML IJ SOLN: 1.0000 mg | INTRAMUSCULAR | Status: DC | PRN

## 2021-02-16 MED ORDER — FENTANYL CITRATE (PF) 100 MCG/2ML IJ SOLN
25.0000 ug | INTRAMUSCULAR | Status: DC | PRN
Start: 2021-02-16 — End: 2021-02-16

## 2021-02-16 MED ORDER — LIDOCAINE HCL 2 % IJ SOLN
20.0000 mL | Freq: Once | INTRAMUSCULAR | Status: AC
  Administered 2021-02-16: 400 mg
  Filled 2021-02-16: qty 40

## 2021-02-16 MED ORDER — TRIAMCINOLONE ACETONIDE 40 MG/ML IJ SUSP
80.0000 mg | Freq: Once | INTRAMUSCULAR | Status: AC
Start: 2021-02-16 — End: 2021-02-16
  Administered 2021-02-16: 80 mg
  Filled 2021-02-16: qty 2

## 2021-02-16 NOTE — Progress Notes (Signed)
PROVIDER NOTE: Information contained herein reflects review and annotations entered in association with encounter. Interpretation of such information and data should be left to medically-trained personnel. Information provided to patient can be located elsewhere in the medical record under "Patient Instructions". Document created using STT-dictation technology, any transcriptional errors that may result from process are unintentional.    Patient: Laura Frey  Service Category: Procedure  Provider: Gaspar Cola, MD  DOB: 10-15-1932  DOS: 02/16/2021  Location: [facility]  MRN: [id number]  Setting: Ambulatory - outpatient  Referring Provider: Marinda Elk, MD  Type: Established Patient  Specialty: Interventional Pain Management  PCP: Marinda Elk, MD   Primary Reason for Visit: Interventional Pain Management Treatment. CC: Back Pain  Procedure:          Anesthesia, Analgesia, Anxiolysis:  Type: Lumbar Facet, Medial Branch Block(s) #3  Primary Purpose: Therapeutic Region: Posterolateral Lumbosacral Spine Level: L2, L3, L4, L5, & S1 Medial Branch Level(s). Injecting these levels blocks the L3-4, L4-5, and L5-S1 lumbar facet joints. Laterality: Bilateral  Type: Local Anesthesia Indication(s): Analgesia         Route: Infiltration (/IM) IV Access: Declined Sedation: Declined  Local Anesthetic: Lidocaine 1-2%  Position: Prone   Indications: 1. Lumbar facet syndrome (Bilateral)   2. Lumbar facet joint osteoarthritis (Bilateral)   3. Lumbar facet hypertrophy (Bilateral)   4. Spondylosis without myelopathy or radiculopathy, lumbar region   5. Lumbar Grade 1 Anterolisthesis of L3/L4   6. DDD (degenerative disc disease), lumbar   7. Chronic low back pain (1ry area of Pain) (Bilateral) (R>L) w/o sciatica    Pain Score: Pre-procedure: 7 /10 Post-procedure: 7 /10   Note: Today we reviewed the results of the cervical spine and lumbar spine  flexion-extension x-rays.  In addition we also reviewed the lumbar MRI.  The patient was found to have an acute L3 compression fracture.  We will be referring the patient for kyphoplasty.  Hopefully she will get in soon, but if she does not, we will put an order for a PRN LESI at L2-3 level to hopefully provide her with some relief of her pain until she can get to see their neurosurgeons.  Today she indicates that her primary pain is that of the lower back and she does have some pain going down the leg, primarily the left leg, but this pain travels through the posterior aspect of the leg and does not go below the knee.  This pain does seem to go towards the lateral aspect of the knee and therefore there is the possibility that there may be a component of a left L3 radiculitis.  Today I have talked to the patient and her family about making sure that she follows up with her primary care physician so that they can treat aggressively her osteoporosis.  She already had a history of an L2 and L4 chronic vertebral body compression fractures but now we have the L3 that has also collapsed.  Pre-op H&P Assessment:  Laura Frey is a 87 y.o. (year old), female patient, seen today for interventional treatment. She  has a past surgical history that includes Tubal ligation; Appendectomy; Tonsillectomy; Eye surgery (Bilateral); Abdominal hysterectomy; Bladder repair; and Anterior cervical decomp/discectomy fusion (N/A, 05/01/2013). Laura Frey has a current medication list which includes the following prescription(s): albuterol, amlodipine, duloxetine, fexofenadine, fosfomycin, furosemide, guaifenesin, levothyroxine, loperamide, lorazepam, magnesium oxide, metoprolol, multivitamin with minerals, nitrofurantoin (macrocrystal-monohydrate), omeprazole, potassium chloride sa, solifenacin, vitamin c, vitamin e, and vitamins/minerals,  and the following Facility-Administered Medications: fentanyl, lactated ringers, and midazolam. Her  primarily concern today is the Back Pain  Initial Vital Signs:  Pulse/HCG Rate: 85ECG Heart Rate: 87 Temp: (!) 96.8 F (36 C) Resp: 16 BP: 121/66 SpO2: 98 %  BMI: Estimated body mass index is 29.45 kg/m as calculated from the following:   Height as of this encounter: 5\' 2"  (1.575 m).   Weight as of this encounter: 161 lb (73 kg).  Risk Assessment: Allergies: Reviewed. She is allergic to ciprofloxacin, hydrocodone, oxycodone, zoloft [sertraline hcl], sertraline, sulfa antibiotics, tylenol [acetaminophen], and ultram [tramadol].  Allergy Precautions: None required Coagulopathies: Reviewed. None identified.  Blood-thinner therapy: None at this time Active Infection(s): Reviewed. None identified. Laura Frey is afebrile  Site Confirmation: Laura Frey was asked to confirm the procedure and laterality before marking the site Procedure checklist: Completed Consent: Before the procedure and under the influence of no sedative(s), amnesic(s), or anxiolytics, the patient was informed of the treatment options, risks and possible complications. To fulfill our ethical and legal obligations, as recommended by the American Medical Association's Code of Ethics, I have informed the patient of my clinical impression; the nature and purpose of the treatment or procedure; the risks, benefits, and possible complications of the intervention; the alternatives, including doing nothing; the risk(s) and benefit(s) of the alternative treatment(s) or procedure(s); and the risk(s) and benefit(s) of doing nothing. The patient was provided information about the general risks and possible complications associated with the procedure. These may include, but are not limited to: failure to achieve desired goals, infection, bleeding, organ or nerve damage, allergic reactions, paralysis, and death. In addition, the patient was informed of those risks and complications associated to Spine-related procedures, such as failure to  decrease pain; infection (i.e.: Meningitis, epidural or intraspinal abscess); bleeding (i.e.: epidural hematoma, subarachnoid hemorrhage, or any other type of intraspinal or peri-dural bleeding); organ or nerve damage (i.e.: Any type of peripheral nerve, nerve root, or spinal cord injury) with subsequent damage to sensory, motor, and/or autonomic systems, resulting in permanent pain, numbness, and/or weakness of one or several areas of the body; allergic reactions; (i.e.: anaphylactic reaction); and/or death. Furthermore, the patient was informed of those risks and complications associated with the medications. These include, but are not limited to: allergic reactions (i.e.: anaphylactic or anaphylactoid reaction(s)); adrenal axis suppression; blood sugar elevation that in diabetics may result in ketoacidosis or comma; water retention that in patients with history of congestive heart failure may result in shortness of breath, pulmonary edema, and decompensation with resultant heart failure; weight gain; swelling or edema; medication-induced neural toxicity; particulate matter embolism and blood vessel occlusion with resultant organ, and/or nervous system infarction; and/or aseptic necrosis of one or more joints. Finally, the patient was informed that Medicine is not an exact science; therefore, there is also the possibility of unforeseen or unpredictable risks and/or possible complications that may result in a catastrophic outcome. The patient indicated having understood very clearly. We have given the patient no guarantees and we have made no promises. Enough time was given to the patient to ask questions, all of which were answered to the patient's satisfaction. Ms. Hott has indicated that she wanted to continue with the procedure. Attestation: I, the ordering provider, attest that I have discussed with the patient the benefits, risks, side-effects, alternatives, likelihood of achieving goals, and potential  problems during recovery for the procedure that I have provided informed consent. Date  Time: 02/16/2021 11:51 AM  Pre-Procedure Preparation:  Monitoring: As per clinic protocol. Respiration, ETCO2, SpO2, BP, heart rate and rhythm monitor placed and checked for adequate function  Safety Precautions: Patient was assessed for positional comfort and pressure points before starting the procedure. Time-out: I initiated and conducted the "Time-out" before starting the procedure, as per protocol. The patient was asked to participate by confirming the accuracy of the "Time Out" information. Verification of the correct person, site, and procedure were performed and confirmed by me, the nursing staff, and the patient. "Time-out" conducted as per Joint Commission's Universal Protocol (UP.01.01.01). Time: 1249  Description of Procedure:          Laterality: Bilateral. The procedure was performed in identical fashion on both sides. Levels:  L2, L3, L4, L5, & S1 Medial Branch Level(s) Area Prepped: Posterior Lumbosacral Region DuraPrep (Iodine Povacrylex [0.7% available iodine] and Isopropyl Alcohol, 74% w/w) Safety Precautions: Aspiration looking for blood return was conducted prior to all injections. At no point did we inject any substances, as a needle was being advanced. Before injecting, the patient was told to immediately notify me if she was experiencing any new onset of "ringing in the ears, or metallic taste in the mouth". No attempts were made at seeking any paresthesias. Safe injection practices and needle disposal techniques used. Medications properly checked for expiration dates. SDV (single dose vial) medications used. After the completion of the procedure, all disposable equipment used was discarded in the proper designated medical waste containers. Local Anesthesia: Protocol guidelines were followed. The patient was positioned over the fluoroscopy table. The area was prepped in the usual manner. The  time-out was completed. The target area was identified using fluoroscopy. A 12-in long, straight, sterile hemostat was used with fluoroscopic guidance to locate the targets for each level blocked. Once located, the skin was marked with an approved surgical skin marker. Once all sites were marked, the skin (epidermis, dermis, and hypodermis), as well as deeper tissues (fat, connective tissue and muscle) were infiltrated with a small amount of a short-acting local anesthetic, loaded on a 10cc syringe with a 25G, 1.5-in  Needle. An appropriate amount of time was allowed for local anesthetics to take effect before proceeding to the next step. Local Anesthetic: Lidocaine 2.0% The unused portion of the local anesthetic was discarded in the proper designated containers. Technical explanation of process:  L2 Medial Branch Nerve Block (MBB): The target area for the L2 medial branch is at the junction of the postero-lateral aspect of the superior articular process and the superior, posterior, and medial edge of the transverse process of L3. Under fluoroscopic guidance, a Quincke needle was inserted until contact was made with os over the superior postero-lateral aspect of the pedicular shadow (target area). After negative aspiration for blood, 0.5 mL of the nerve block solution was injected without difficulty or complication. The needle was removed intact. L3 Medial Branch Nerve Block (MBB): The target area for the L3 medial branch is at the junction of the postero-lateral aspect of the superior articular process and the superior, posterior, and medial edge of the transverse process of L4. Under fluoroscopic guidance, a Quincke needle was inserted until contact was made with os over the superior postero-lateral aspect of the pedicular shadow (target area). After negative aspiration for blood, 0.5 mL of the nerve block solution was injected without difficulty or complication. The needle was removed intact. L4 Medial  Branch Nerve Block (MBB): The target area for the L4 medial branch is at the junction of the postero-lateral aspect of  the superior articular process and the superior, posterior, and medial edge of the transverse process of L5. Under fluoroscopic guidance, a Quincke needle was inserted until contact was made with os over the superior postero-lateral aspect of the pedicular shadow (target area). After negative aspiration for blood, 0.5 mL of the nerve block solution was injected without difficulty or complication. The needle was removed intact. L5 Medial Branch Nerve Block (MBB): The target area for the L5 medial branch is at the junction of the postero-lateral aspect of the superior articular process and the superior, posterior, and medial edge of the sacral ala. Under fluoroscopic guidance, a Quincke needle was inserted until contact was made with os over the superior postero-lateral aspect of the pedicular shadow (target area). After negative aspiration for blood, 0.5 mL of the nerve block solution was injected without difficulty or complication. The needle was removed intact. S1 Medial Branch Nerve Block (MBB): The target area for the S1 medial branch is at the posterior and inferior 6 o'clock position of the L5-S1 facet joint. Under fluoroscopic guidance, the Quincke needle inserted for the L5 MBB was redirected until contact was made with os over the inferior and postero aspect of the sacrum, at the 6 o' clock position under the L5-S1 facet joint (Target area). After negative aspiration for blood, 0.5 mL of the nerve block solution was injected without difficulty or complication. The needle was removed intact.  Nerve block solution: 0.2% PF-Ropivacaine + Triamcinolone (40 mg/mL) diluted to a final concentration of 4 mg of Triamcinolone/mL of Ropivacaine The unused portion of the solution was discarded in the proper designated containers. Procedural Needles: 22-gauge, 5-inch, Quincke needles used for all  levels.  Once the entire procedure was completed, the treated area was cleaned, making sure to leave some of the prepping solution back to take advantage of its long term bactericidal properties.      Illustration of the posterior view of the lumbar spine and the posterior neural structures. Laminae of L2 through S1 are labeled. DPRL5, dorsal primary ramus of L5; DPRS1, dorsal primary ramus of S1; DPR3, dorsal primary ramus of L3; FJ, facet (zygapophyseal) joint L3-L4; I, inferior articular process of L4; LB1, lateral branch of dorsal primary ramus of L1; IAB, inferior articular branches from L3 medial branch (supplies L4-L5 facet joint); IBP, intermediate branch plexus; MB3, medial branch of dorsal primary ramus of L3; NR3, third lumbar nerve root; S, superior articular process of L5; SAB, superior articular branches from L4 (supplies L4-5 facet joint also); TP3, transverse process of L3.  Vitals:   02/16/21 1246 02/16/21 1251 02/16/21 1256 02/16/21 1300  BP: (!) 145/61 125/67 (!) 142/61 138/83  Pulse:      Resp: 18 16 17 19   Temp:      SpO2: 95% 95% 95% 95%  Weight:      Height:         Start Time: 1249 hrs. End Time: 1259 hrs.  Imaging Guidance (Spinal):          Type of Imaging Technique: Fluoroscopy Guidance (Spinal) Indication(s): Assistance in needle guidance and placement for procedures requiring needle placement in or near specific anatomical locations not easily accessible without such assistance. Exposure Time: Please see nurses notes. Contrast: None used. Fluoroscopic Guidance: I was personally present during the use of fluoroscopy. "Tunnel Vision Technique" used to obtain the best possible view of the target area. Parallax error corrected before commencing the procedure. "Direction-depth-direction" technique used to introduce the needle under continuous pulsed fluoroscopy.  Once target was reached, antero-posterior, oblique, and lateral fluoroscopic projection used confirm  needle placement in all planes. Images permanently stored in EMR. Interpretation: No contrast injected. I personally interpreted the imaging intraoperatively. Adequate needle placement confirmed in multiple planes. Permanent images saved into the patient's record.  Antibiotic Prophylaxis:   Anti-infectives (From admission, onward)   None     Indication(s): None identified  Post-operative Assessment:  Post-procedure Vital Signs:  Pulse/HCG Rate: 8589 Temp: (!) 96.8 F (36 C) Resp: 19 BP: 138/83 SpO2: 95 %  EBL: None  Complications: No immediate post-treatment complications observed by team, or reported by patient.  Note: The patient tolerated the entire procedure well. A repeat set of vitals were taken after the procedure and the patient was kept under observation following institutional policy, for this type of procedure. Post-procedural neurological assessment was performed, showing return to baseline, prior to discharge. The patient was provided with post-procedure discharge instructions, including a section on how to identify potential problems. Should any problems arise concerning this procedure, the patient was given instructions to immediately contact us, at any time, without hesitation. In any case, we plan to contact the patient by telephone for a follow-up status report regarding this interventional procedure.  Comments:  No additional relevant information.  Plan of Care  Orders:  Orders Placed This Encounter  Procedures  . LUMBAR FACET(MEDIAL BRANCH NERVE BLOCK) MBNB    Scheduling Instructions:     Procedure: Lumbar facet block (AKA.: Lumbosacral medial branch nerve block)     Side: Bilateral     Level: L3-4, L4-5, & L5-S1 Facets (L2, L3, L4, L5, & S1 Medial Branch Nerves)     Sedation: Patient's choice.     Timeframe: Today    Order Specific Question:   Where will this procedure be performed?    Answer:   [facility]  . Lumbar Epidural Injection     Standing Status:   Future    Standing Expiration Date:   03/19/2021    Scheduling Instructions:     Procedure: Interlaminar Lumbar Epidural Steroid injection (LESI)  L2-3     Laterality: Left-sided     Sedation: Patient's choice.     Timeframe: Patient will call    Order Specific Question:   Where will this procedure be performed?    Answer:   [facility]  . DG PAIN CLIN[facility] C-ARM 1-60 MIN NO REPORT    Intraoperative interpretation by procedural physician at [place].    Standing Status:   Standing    Number of Occurrences:   1    Order Specific Question:   Reason for exam:    Answer:   Assistance in needle guidance and placement for procedures requiring needle placement in or near specific anatomical locations not easily accessible without such assistance.  . Ambulatory referral to Neurosurgery    Referral Priority:   Routine    Referral Type:   Surgical    Referral Reason:   Specialty Services Required    Requested Specialty:   Neurosurgery    Number of Visits Requested:   1  . Informed Consent Details: Physician/Practitioner Attestation; Transcribe to consent form and obtain patient signature    Nursing Order: Transcribe to consent form and obtain patient signature. Note: Always confirm laterality of pain with Ms. Battistini, before procedure.    Order Specific Question:   Physician/Practitioner attestation of informed consent for procedure/surgical case    Answer:   I, the physician/practitioner, attest that I have  discussed with the patient the benefits, risks, side effects, alternatives, likelihood of achieving goals and potential problems during recovery for the procedure that I have provided informed consent.    Order Specific Question:   Procedure    Answer:   Lumbar Facet Block  under fluoroscopic guidance    Order Specific Question:   Physician/Practitioner performing the procedure    Answer:   Montgomery Favor A. Dossie Arbour MD    Order Specific Question:    Indication/Reason    Answer:   Low Back Pain, with our without leg pain, due to Facet Joint Arthralgia (Joint Pain) Spondylosis (Arthritis of the Spine), without myelopathy or radiculopathy (Nerve Damage).  . Provide equipment / supplies at bedside    "Block Tray" (Disposable  single use) Needle type: SpinalSpinal Amount/quantity: 4 Size: Medium (5-inch) Gauge: 22G    Standing Status:   Standing    Number of Occurrences:   1    Order Specific Question:   Specify    Answer:   Block Tray   Chronic Opioid Analgesic:  No opioid analgesics prescribed by our practice.   Medications ordered for procedure: Meds ordered this encounter  Medications  . lidocaine (XYLOCAINE) 2 % (with pres) injection 400 mg  . lactated ringers infusion 1,000 mL  . midazolam (VERSED) 5 MG/5ML injection 1-2 mg    Make sure Flumazenil is available in the pyxis when using this medication. If oversedation occurs, administer 0.2 mg IV over 15 sec. If after 45 sec no response, administer 0.2 mg again over 1 min; may repeat at 1 min intervals; not to exceed 4 doses (1 mg)  . fentaNYL (SUBLIMAZE) injection 25-50 mcg    Make sure Narcan is available in the pyxis when using this medication. In the event of respiratory depression (RR< 8/min): Titrate NARCAN (naloxone) in increments of 0.1 to 0.2 mg IV at 2-3 minute intervals, until desired degree of reversal.  . ropivacaine (PF) 2 mg/mL (0.2%) (NAROPIN) injection 18 mL  . triamcinolone acetonide (KENALOG-40) injection 80 mg   Medications administered: We administered lidocaine, ropivacaine (PF) 2 mg/mL (0.2%), and triamcinolone acetonide.  See the medical record for exact dosing, route, and time of administration.  Follow-up plan:   Return in about 2 weeks (around 03/02/2021) for (afternoon VV), on procedure day, (PPE), in addition PRN Procedure (no sedation): (L) L2-3 LESI .       Interventional Therapies  Risk  Complexity Considerations:   Estimated body mass index  is 29.45 kg/m as calculated from the following:   Height as of this encounter: 5\' 2"  (1.575 m).   Weight as of this encounter: 161 lb (73 kg). Patient is initially referred as a  "Fast-Track".  Poor compliance with follow-up evaluations after procedures.  (02/27/2018; 10/30/2018)    Planned  Pending:   Pending further evaluation   Under consideration:   Diagnostic right L3-4 LESI#3 Diagnostic bilateral L3, L4 and/or L5 TESI Diagnostic bilateral L2-3 transforaminal ESI Diagnostic bilateral lumbar facet MBB #3 Diagnostic cervical facet MBB Diagnostic midlineCervicalESI   Completed:   Diagnostic bilateral lumbar facet MBB x2 (10/30/2018) (100/100/100/25) Therapeutic midline-right L2-3 LESI x2 (07/17/2018) (100/100/100/75-100) (100/100/90/50)  Therapeutic right L3-4 LESI x2 (02/27/2018) (80/80/80/>50)   Therapeutic  Palliative (PRN) options:   Palliativeright L3-4 LESI #3     Recent Visits Date Type Provider Dept  01/26/21 Office Visit Milinda Pointer, MD [facility]  Showing recent visits within past 90 days and meeting all other requirements Today's Visits Date Type Provider Dept  02/16/21 Procedure visit Milinda Pointer, MD [facility]  Showing today's visits and meeting all other requirements Future Appointments Date Type Provider Dept  03/04/21 Appointment Milinda Pointer, MD [facility]  Showing future appointments within next 90 days and meeting all other requirements  Disposition: Discharge home  Discharge (Date  Time): 02/16/2021; 1310 hrs.   Primary Care Physician: Marinda Elk, MD Location: [facility] Note by: Gaspar Cola, MD Date: 02/16/2021; Time: 3:57 PM  Disclaimer:  Medicine is not an Chief Strategy Officer. The only guarantee in medicine is that nothing is guaranteed. It is important to note that the decision to proceed with this intervention was based on the information  collected from the patient. The Data and conclusions were drawn from the patient's questionnaire, the interview, and the physical examination. Because the information was provided in large part by the patient, it cannot be guaranteed that it has not been purposely or unconsciously manipulated. Every effort has been made to obtain as much relevant data as possible for this evaluation. It is important to note that the conclusions that lead to this procedure are derived in large part from the available data. Always take into account that the treatment will also be dependent on availability of resources and existing treatment guidelines, considered by other Pain Management Practitioners as being common knowledge and practice, at the time of the intervention. For Medico-Legal purposes, it is also important to point out that variation in procedural techniques and pharmacological choices are the acceptable norm. The indications, contraindications, technique, and results of the above procedure should only be interpreted and judged by a Board-Certified Interventional Pain Specialist with extensive familiarity and expertise in the same exact procedure and technique.

## 2021-02-16 NOTE — Patient Instructions (Addendum)
____________________________________________________________________________________________  Post-Procedure Discharge Instructions  Instructions:  Apply ice:   Purpose: This will minimize any swelling and discomfort after procedure.   When: Day of procedure, as soon as you get home.  How: Fill a plastic sandwich bag with crushed ice. Cover it with a small towel and apply to injection site.  How long: (15 min on, 15 min off) Apply for 15 minutes then remove x 15 minutes.  Repeat sequence on day of procedure, until you go to bed.  Apply heat:   Purpose: To treat any soreness and discomfort from the procedure.  When: Starting the next day after the procedure.  How: Apply heat to procedure site starting the day following the procedure.  How long: May continue to repeat daily, until discomfort goes away.  Food intake: Start with clear liquids (like water) and advance to regular food, as tolerated.   Physical activities: Keep activities to a minimum for the first 8 hours after the procedure. After that, then as tolerated.  Driving: If you have received any sedation, be responsible and do not drive. You are not allowed to drive for 24 hours after having sedation.  Blood thinner: (Applies only to those taking blood thinners) You may restart your blood thinner 6 hours after your procedure.  Insulin: (Applies only to Diabetic patients taking insulin) As soon as you can eat, you may resume your normal dosing schedule.  Infection prevention: Keep procedure site clean and dry. Shower daily and clean area with soap and water.  Post-procedure Pain Diary: Extremely important that this be done correctly and accurately. Recorded information will be used to determine the next step in treatment. For the purpose of accuracy, follow these rules:  Evaluate only the area treated. Do not report or include pain from an untreated area. For the purpose of this evaluation, ignore all other areas of pain,  except for the treated area.  After your procedure, avoid taking a long nap and attempting to complete the pain diary after you wake up. Instead, set your alarm clock to go off every hour, on the hour, for the initial 8 hours after the procedure. Document the duration of the numbing medicine, and the relief you are getting from it.  Do not go to sleep and attempt to complete it later. It will not be accurate. If you received sedation, it is likely that you were given a medication that may cause amnesia. Because of this, completing the diary at a later time may cause the information to be inaccurate. This information is needed to plan your care.  Follow-up appointment: Keep your post-procedure follow-up evaluation appointment after the procedure (usually 2 weeks for most procedures, 6 weeks for radiofrequencies). DO NOT FORGET to bring you pain diary with you.   Expect: (What should I expect to see with my procedure?)  From numbing medicine (AKA: Local Anesthetics): Numbness or decrease in pain. You may also experience some weakness, which if present, could last for the duration of the local anesthetic.  Onset: Full effect within 15 minutes of injected.  Duration: It will depend on the type of local anesthetic used. On the average, 1 to 8 hours.   From steroids (Applies only if steroids were used): Decrease in swelling or inflammation. Once inflammation is improved, relief of the pain will follow.  Onset of benefits: Depends on the amount of swelling present. The more swelling, the longer it will take for the benefits to be seen. In some cases, up to 10 days.    Duration: Steroids will stay in the system x 2 weeks. Duration of benefits will depend on multiple posibilities including persistent irritating factors.  Side-effects: If present, they may typically last 2 weeks (the duration of the steroids).  Frequent: Cramps (if they occur, drink Gatorade and take over-the-counter Magnesium 450-500 mg  once to twice a day); water retention with temporary weight gain; increases in blood sugar; decreased immune system response; increased appetite.  Occasional: Facial flushing (red, warm cheeks); mood swings; menstrual changes.  Uncommon: Long-term decrease or suppression of natural hormones; bone thinning. (These are more common with higher doses or more frequent use. This is why we prefer that our patients avoid having any injection therapies in other practices.)   Very Rare: Severe mood changes; psychosis; aseptic necrosis.  From procedure: Some discomfort is to be expected once the numbing medicine wears off. This should be minimal if ice and heat are applied as instructed.  Call if: (When should I call?)  You experience numbness and weakness that gets worse with time, as opposed to wearing off.  New onset bowel or bladder incontinence. (Applies only to procedures done in the spine)  Emergency Numbers:  Durning business hours (Monday - Thursday, 8:00 AM - 4:00 PM) (Friday, 9:00 AM - 12:00 Noon): [phone]  After hours: [phone]  NOTE: If you are having a problem and are unable connect with, or to talk to a provider, then go to your nearest urgent care or emergency department. If the problem is serious and urgent, please call 911. ____________________________________________________________________________________________   ____________________________________________________________________________________________  Preparing for your procedure (without sedation)  Procedure appointments are limited to planned procedures: . No Prescription Refills. . No disability issues will be discussed. . No medication changes will be discussed.  Instructions: . Oral Intake: Do not eat or drink anything for at least 6 hours prior to your procedure. (Exception: Blood Pressure Medication. See below.) . Transportation: Unless otherwise stated by your physician, you may drive yourself  after the procedure. . Blood Pressure Medicine: Do not forget to take your blood pressure medicine with a sip of water the morning of the procedure. If your Diastolic (lower reading)is above 100 mmHg, elective cases will be cancelled/rescheduled. . Blood thinners: These will need to be stopped for procedures. Notify our staff if you are taking any blood thinners. Depending on which one you take, there will be specific instructions on how and when to stop it. . Diabetics on insulin: Notify the staff so that you can be scheduled 1st case in the morning. If your diabetes requires high dose insulin, take only  of your normal insulin dose the morning of the procedure and notify the staff that you have done so. . Preventing infections: Shower with an antibacterial soap the morning of your procedure.  . Build-up your immune system: Take 1000 mg of Vitamin C with every meal (3 times a day) the day prior to your procedure. . Antibiotics: Inform the staff if you have a condition or reason that requires you to take antibiotics before dental procedures. . Pregnancy: If you are pregnant, call and cancel the procedure. . Sickness: If you have a cold, fever, or any active infections, call and cancel the procedure. . Arrival: You must be in the facility at least 30 minutes prior to your scheduled procedure. . Children: Do not bring any children with you. . Dress appropriately: Bring dark clothing that you would not mind if they get stained. . Valuables: Do not bring any jewelry   or valuables.  Reasons to call and reschedule or cancel your procedure: (Following these recommendations will minimize the risk of a serious complication.) . Surgeries: Avoid having procedures within 2 weeks of any surgery. (Avoid for 2 weeks before or after any surgery). . Flu Shots: Avoid having procedures within 2 weeks of a flu shots or . (Avoid for 2 weeks before or after immunizations). . Barium: Avoid having a procedure within 7-10  days after having had a radiological study involving the use of radiological contrast. (Myelograms, Barium swallow or enema study). . Heart attacks: Avoid any elective procedures or surgeries for the initial 6 months after a "Myocardial Infarction" (Heart Attack). . Blood thinners: It is imperative that you stop these medications before procedures. Let us know if you if you take any blood thinner.  . Infection: Avoid procedures during or within two weeks of an infection (including chest colds or gastrointestinal problems). Symptoms associated with infections include: Localized redness, fever, chills, night sweats or profuse sweating, burning sensation when voiding, cough, congestion, stuffiness, runny nose, sore throat, diarrhea, nausea, vomiting, cold or Flu symptoms, recent or current infections. It is specially important if the infection is over the area that we intend to treat. Marland Kitchen Heart and lung problems: Symptoms that may suggest an active cardiopulmonary problem include: cough, chest pain, breathing difficulties or shortness of breath, dizziness, ankle swelling, uncontrolled high or unusually low blood pressure, and/or palpitations. If you are experiencing any of these symptoms, cancel your procedure and contact your primary care physician for an evaluation.  Remember:  Regular Business hours are:  Monday to Thursday 8:00 AM to 4:00 PM  Provider's Schedule: Milinda Pointer, MD:  Procedure days: Tuesday and Thursday 7:30 AM to 4:00 PM  Gillis Santa, MD:  Procedure days: Monday and Wednesday 7:30 AM to 4:00 PM ____________________________________________________________________________________________   ____________________________________________________________________________________________  General Risks and Possible Complications  Patient Responsibilities: It is important that you read this as it is part of your informed consent. It is our duty to inform you of the risks and  possible complications associated with treatments offered to you. It is your responsibility as a patient to read this and to ask questions about anything that is not clear or that you believe was not covered in this document.  Patient's Rights: You have the right to refuse treatment. You also have the right to change your mind, even after initially having agreed to have the treatment done. However, under this last option, if you wait until the last second to change your mind, you may be charged for the materials used up to that point.  Introduction: Medicine is not an Chief Strategy Officer. Everything in Medicine, including the lack of treatment(s), carries the potential for danger, harm, or loss (which is by definition: Risk). In Medicine, a complication is a secondary problem, condition, or disease that can aggravate an already existing one. All treatments carry the risk of possible complications. The fact that a side effects or complications occurs, does not imply that the treatment was conducted incorrectly. It must be clearly understood that these can happen even when everything is done following the highest safety standards.  No treatment: You can choose not to proceed with the proposed treatment alternative. The "PRO(s)" would include: avoiding the risk of complications associated with the therapy. The "CON(s)" would include: not getting any of the treatment benefits. These benefits fall under one of three categories: diagnostic; therapeutic; and/or palliative. Diagnostic benefits include: getting information which can ultimately lead to improvement  of the disease or symptom(s). Therapeutic benefits are those associated with the successful treatment of the disease. Finally, palliative benefits are those related to the decrease of the primary symptoms, without necessarily curing the condition (example: decreasing the pain from a flare-up of a chronic condition, such as incurable terminal cancer).  General  Risks and Complications: These are associated to most interventional treatments. They can occur alone, or in combination. They fall under one of the following six (6) categories: no benefit or worsening of symptoms; bleeding; infection; nerve damage; allergic reactions; and/or death. 1. No benefits or worsening of symptoms: In Medicine there are no guarantees, only probabilities. No healthcare provider can ever guarantee that a medical treatment will work, they can only state the probability that it may. Furthermore, there is always the possibility that the condition may worsen, either directly, or indirectly, as a consequence of the treatment. 2. Bleeding: This is more common if the patient is taking a blood thinner, either prescription or over the counter (example: Goody Powders, Fish oil, Aspirin, Garlic, etc.), or if suffering a condition associated with impaired coagulation (example: Hemophilia, cirrhosis of the liver, low platelet counts, etc.). However, even if you do not have one on these, it can still happen. If you have any of these conditions, or take one of these drugs, make sure to notify your treating physician. 3. Infection: This is more common in patients with a compromised immune system, either due to disease (example: diabetes, cancer, human immunodeficiency virus [HIV], etc.), or due to medications or treatments (example: therapies used to treat cancer and rheumatological diseases). However, even if you do not have one on these, it can still happen. If you have any of these conditions, or take one of these drugs, make sure to notify your treating physician. 4. Nerve Damage: This is more common when the treatment is an invasive one, but it can also happen with the use of medications, such as those used in the treatment of cancer. The damage can occur to small secondary nerves, or to large primary ones, such as those in the spinal cord and brain. This damage may be temporary or permanent and it  may lead to impairments that can range from temporary numbness to permanent paralysis and/or brain death. 5. Allergic Reactions: Any time a substance or material comes in contact with our body, there is the possibility of an allergic reaction. These can range from a mild skin rash (contact dermatitis) to a severe systemic reaction (anaphylactic reaction), which can result in death. 6. Death: In general, any medical intervention can result in death, most of the time due to an unforeseen complication. ____________________________________________________________________________________________  Pain Management Discharge Instructions  General Discharge Instructions :  If you need to reach your doctor call: Monday-Friday 8:00 am - 4:00 pm at [phone] or toll free [phone].  After clinic hours [phone] to have operator reach doctor.  Bring all of your medication bottles to all your appointments in the pain clinic.  To cancel or reschedule your appointment with Pain Management please remember to call 24 hours in advance to avoid a fee.  Refer to the educational materials which you have been given on: General Risks, I had my Procedure. Discharge Instructions, Post Sedation.  Post Procedure Instructions:  The drugs you were given will stay in your system until tomorrow, so for the next 24 hours you should not drive, make any legal decisions or drink any alcoholic beverages.  You may eat anything you prefer, but it is  better to start with liquids then soups and crackers, and gradually work up to solid foods.  Please notify your doctor immediately if you have any unusual bleeding, trouble breathing or pain that is not related to your normal pain.  Depending on the type of procedure that was done, some parts of your body may feel week and/or numb.  This usually clears up by tonight or the next day.  Walk with the use of an assistive device or accompanied by an adult for the 24  hours.  You may use ice on the affected area for the first 24 hours.  Put ice in a Ziploc bag and cover with a towel and place against area 15 minutes on 15 minutes off.  You may switch to heat after 24 hours.Facet Blocks Patient Information  Description: The facets are joints in the spine between the vertebrae.  Like any joints in the body, facets can become irritated and painful.  Arthritis can also effect the facets.  By injecting steroids and local anesthetic in and around these joints, we can temporarily block the nerve supply to them.  Steroids act directly on irritated nerves and tissues to reduce selling and inflammation which often leads to decreased pain.  Facet blocks may be done anywhere along the spine from the neck to the low back depending upon the location of your pain.   After numbing the skin with local anesthetic (like Novocaine), a small needle is passed onto the facet joints under x-ray guidance.  You may experience a sensation of pressure while this is being done.  The entire block usually lasts about 15-25 minutes.   Conditions which may be treated by facet blocks:   Low back/buttock pain  Neck/shoulder pain  Certain types of headaches  Preparation for the injection:  1. Do not eat any solid food or dairy products within 8 hours of your appointment. 2. You may drink clear liquid up to 3 hours before appointment.  Clear liquids include water, black coffee, juice or soda.  No milk or cream please. 3. You may take your regular medication, including pain medications, with a sip of water before your appointment.  Diabetics should hold regular insulin (if taken separately) and take 1/2 normal NPH dose the morning of the procedure.  Carry some sugar containing items with you to your appointment. 4. A driver must accompany you and be prepared to drive you home after your procedure. 5. Bring all your current medications with you. 6. An IV may be inserted and sedation may be  given at the discretion of the physician. 7. A blood pressure cuff, EKG and other monitors will often be applied during the procedure.  Some patients may need to have extra oxygen administered for a short period. 8. You will be asked to provide medical information, including your allergies and medications, prior to the procedure.  We must know immediately if you are taking blood thinners (like Coumadin/Warfarin) or if you are allergic to IV iodine contrast (dye).  We must know if you could possible be pregnant.  Possible side-effects:   Bleeding from needle site  Infection (rare, may require surgery)  Nerve injury (rare)  Numbness & tingling (temporary)  Difficulty urinating (rare, temporary)  Spinal headache (a headache worse with upright posture)  Light-headedness (temporary)  Pain at injection site (serveral days)  Decreased blood pressure (rare, temporary)  Weakness in arm/leg (temporary)  Pressure sensation in back/neck (temporary)   Call if you experience:   Fever/chills associated  with headache or increased back/neck pain  Headache worsened by an upright position  New onset, weakness or numbness of an extremity below the injection site  Hives or difficulty breathing (go to the emergency room)  Inflammation or drainage at the injection site(s)  Severe back/neck pain greater than usual  New symptoms which are concerning to you  Please note:  Although the local anesthetic injected can often make your back or neck feel good for several hours after the injection, the pain will likely return. It takes 3-7 days for steroids to work.  You may not notice any pain relief for at least one week.  If effective, we will often do a series of 2-3 injections spaced 3-6 weeks apart to maximally decrease your pain.  After the initial series, you may be a candidate for a more permanent nerve block of the facets.  If you have any questions, please call #[phone] [facility]

## 2021-02-16 NOTE — Progress Notes (Signed)
Safety precautions to be maintained throughout the outpatient stay will include: orient to surroundings, keep bed in low position, maintain call bell within reach at all times, provide assistance with transfer out of bed and ambulation.  

## 2021-02-17 ENCOUNTER — Telehealth: Payer: Self-pay

## 2021-02-17 NOTE — Telephone Encounter (Signed)
Post procedure phone call.  Patients daughter states she is doing good.  

## 2021-02-17 NOTE — Telephone Encounter (Signed)
Post procedure phone call.   

## 2021-03-03 NOTE — Progress Notes (Signed)
Patient: Laura Frey  Service Category: E/M  Provider: Francisco A Naveira, MD  DOB: 10/09/1932  DOS: 03/04/2021  Location: Office  MRN: [id number]  Setting: Ambulatory outpatient  Referring Provider: McLaughlin, Miriam K, MD  Type: Established Patient  Specialty: Interventional Pain Management  PCP: McLaughlin, Miriam K, MD  Location: Remote location  Delivery: TeleHealth     Virtual Encounter - Pain Management PROVIDER NOTE: Information contained herein reflects review and annotations entered in association with encounter. Interpretation of such information and data should be left to medically-trained personnel. Information provided to patient can be located elsewhere in the medical record under "Patient Instructions". Document created using STT-dictation technology, any transcriptional errors that may result from process are unintentional.    Contact & Pharmacy Preferred: [phone] Home: [phone] (home) Mobile: [phone] (mobile) E-mail: No e-mail address on record  [facility] - [place], [place] - [address] [place] [address] Phone: [phone] Fax: [phone]  [facility] - [place], [place] - [address] [place] [address] Phone: [phone] Fax: [phone]   Pre-screening  Laura Frey offered "in-person" vs "virtual" encounter. She indicated preferring virtual for this encounter.   Reason COVID-19*  Social distancing based on CDC and AMA recommendations.   I contacted Laura Frey on 03/04/2021 via telephone.      I clearly identified myself as Francisco A Naveira, MD. I verified that I was speaking with the correct person using two identifiers (Name: Laura Frey, and date of birth: 09/24/1932).  Consent I sought verbal advanced consent from Laura Frey for virtual visit interactions. I informed Laura Frey of possible security and privacy concerns, risks, and  limitations associated with providing "not-in-person" medical evaluation and management services. I also informed Laura Frey of the availability of "in-person" appointments. Finally, I informed her that there would be a charge for the virtual visit and that she could be  personally, fully or partially, financially responsible for it. Laura Frey expressed understanding and agreed to proceed.   Historic Elements   Laura Frey is a 88 y.o. year old, female patient evaluated today after our last contact on 02/16/2021. Laura Frey  has a past medical history of Anemia, Asthma, Cancer (HCC), Chronic back pain, Depression, GERD (gastroesophageal reflux disease), Headache(784.0), Hypertension, Hypothyroidism, Peripheral vascular disease (HCC), Sepsis (HCC) (01/27/2014), Shortness of breath, Tendinitis of both rotator cuffs, and UTI (urinary tract infection). She also  has a past surgical history that includes Tubal ligation; Appendectomy; Tonsillectomy; Eye surgery (Bilateral); Abdominal hysterectomy; Bladder repair; and Anterior cervical decomp/discectomy fusion (N/A, 05/01/2013). Laura Frey has a current medication list which includes the following prescription(s): albuterol, amlodipine, duloxetine, fexofenadine, fosfomycin, furosemide, guaifenesin, levothyroxine, loperamide, lorazepam, magnesium oxide, metoprolol, multivitamin with minerals, nitrofurantoin (macrocrystal-monohydrate), omeprazole, potassium chloride sa, solifenacin, vitamin c, vitamin e, and vitamins/minerals. She  reports that she quit smoking about 25 years ago. Her smoking use included cigarettes. She has a 30.00 pack-year smoking history. She has never used smokeless tobacco. She reports that she does not drink alcohol and does not use drugs. Laura Frey is allergic to ciprofloxacin, hydrocodone, oxycodone, zoloft [sertraline hcl], sertraline, sulfa antibiotics, tylenol [acetaminophen], and ultram [tramadol].   HPI  Today, she is being contacted  for a post-procedure assessment.  Post-Procedure Evaluation  Procedure (02/16/2021): Therapeutic bilateral lumbar facet MBB #3 under fluoroscopic guidance, no sedation Pre-procedure pain level: 7/10 Post-procedure: 7/10 No initial benefit, possibly due to   rapid discharge after no sedation procedure, without enough time to allow full onset of block.  Sedation: None.  Effectiveness during initial hour after procedure(Ultra-Short Term Relief): 100 %.  Local anesthetic used: Long-acting (4-6 hours) Effectiveness: Defined as any analgesic benefit obtained secondary to the administration of local anesthetics. This carries significant diagnostic value as to the etiological location, or anatomical origin, of the pain. Duration of benefit is expected to coincide with the duration of the local anesthetic used.  Effectiveness during initial 4-6 hours after procedure(Short-Term Relief): 100 %.  Long-term benefit: Defined as any relief past the pharmacologic duration of the local anesthetics.  Effectiveness past the initial 6 hours after procedure(Long-Term Relief): 75 % (2 weeks).  Current benefits: Defined as benefit that persist at this time.   Analgesia:  >50% relief Function: Laura Frey reports improvement in function ROM: Laura Frey reports improvement in ROM   Statement of Medical Necessity:  Laura Frey to experienced debilitating chronic nerve-associated pain from the Lumbosacral Facet Syndrome (Spondylosis without myelopathy or radiculopathy, lumbosacral region [O17.510]).  Duration: This pain has persisted for longer than three months.  Non-surgical care: The patient has either failed to respond, or was unable to tolerate, or simply did not get enough benefit from other more conservative therapies including, but not limited to: 1. Over-the-counter oral analgesic medications (i.e.: ibuprofen, naproxen, etc.) 2. Anti-inflammatory medications 3. Muscle relaxants 4. Membrane  stabilizers 5. Opioids 6. Physical therapy (PT), chiropractic manipulation, and/or home exercise program (HEP). 7. Modalities (Heat, ice, etc.)  Invasive therapies: Nerve blocks have failed to provide any significant long-term benefit.  Surgical care: Not indicated.  Physical exam: Has been consistent with Lumbosacral spondylosis with radiculopathy.  Diagnostic imaging: Lumbosacral Facet Arthropathy.                Diagnostic interventional therapies: Ms. Roundy has attained greater than 50% reduction in pain from at least two (2) diagnostic medial branch blocks conducted in separate occasions.   For the above listed reason, I believe, as the examining and treating physician, that it is medically necessary to proceed with Non-Pulsed Radiofrequency Ablation for the purpose of attempting to prolong the duration of the benefits seen with the diagnostic injections.  Pharmacotherapy Assessment  Analgesic: No opioid analgesics prescribed by our practice.   Monitoring: [place] PMP: PDMP reviewed during this encounter.       Pharmacotherapy: No side-effects or adverse reactions reported. Compliance: No problems identified. Effectiveness: Clinically acceptable. Plan: Refer to "POC".  UDS: No results found for: SUMMARY  Laboratory Chemistry Profile   Renal Lab Results  Component Value Date   BUN 19 01/01/2018   CREATININE 0.98 01/01/2018   BCR 19 01/01/2018   GFRAA 62 01/01/2018   GFRNONAA 54 (L) 01/01/2018     Hepatic Lab Results  Component Value Date   AST 23 01/01/2018   ALT 13 02/05/2014   ALBUMIN 4.5 01/01/2018   ALKPHOS 91 01/01/2018     Electrolytes Lab Results  Component Value Date   NA 139 01/01/2018   K 4.2 01/01/2018   CL 96 01/01/2018   CALCIUM 9.3 01/01/2018   MG 2.2 01/01/2018     Bone Lab Results  Component Value Date   25OHVITD1 29 (L) 01/01/2018   25OHVITD2 <1.0 01/01/2018   25OHVITD3 28 01/01/2018     Inflammation (CRP: Acute Phase) (ESR: Chronic  Phase) Lab Results  Component Value Date   CRP 1.0 01/01/2018   ESRSEDRATE 12 01/01/2018   LATICACIDVEN 1.3 01/29/2014  Note: Above Lab results reviewed.  Imaging  DG PAIN CLINIC C-ARM 1-60 MIN NO REPORT Fluoro was used, but no Radiologist interpretation will be provided.  Please refer to "NOTES" tab for provider progress note.  Assessment  The primary encounter diagnosis was Lumbar facet syndrome (Bilateral). Diagnoses of Lumbar Grade 1 Anterolisthesis of L3/L4, Lumbosacral Grade 1 Retrolisthesis of L5/S1, Chronic low back pain (1ry area of Pain) (Bilateral) (R>L) w/o sciatica, and DDD (degenerative disc disease), lumbar were also pertinent to this visit.  Plan of Care  Problem-specific:  No problem-specific Assessment & Plan notes found for this encounter.  Ms. Heide Brossart has a current medication list which includes the following long-term medication(s): albuterol, amlodipine, duloxetine, fexofenadine, furosemide, metoprolol, omeprazole, and potassium chloride sa.  Pharmacotherapy (Medications Ordered): No orders of the defined types were placed in this encounter.  Orders:  Orders Placed This Encounter  Procedures  . Radiofrequency,Lumbar    Standing Status:   Future    Standing Expiration Date:   03/04/2022    Scheduling Instructions:     Side(s): Left-sided     Level: L3-4, L4-5, & L5-S1 Facets (L2, L3, L4, L5, & S1 Medial Branch Nerves)     Sedation: With Sedation.     Scheduling Timeframe: As soon as pre-approved    Order Specific Question:   Where will this procedure be performed?    Answer:   [facility]   Follow-up plan:   Return for RFA (109mn): (L) L-FCT RFA #1.      Interventional Therapies  Risk  Complexity Considerations:   Estimated body mass index is 29.45 kg/m as calculated from the following:   Height as of 02/16/21: 5' 2" (1.575 m).   Weight as of 02/16/21: 161 lb (73 kg). Patient is initially referred as a  "Fast-Track".  Poor  compliance with follow-up evaluations after procedures.  (02/27/2018; 10/30/2018)    Planned  Pending:   Therapeutic bilateral lumbar facet RFA #1 (starting on the left side and following up 2 weeks later with the right side.)   Under consideration:   Diagnostic right L3-4 LESI#3 Diagnostic bilateral L3, L4 and/or L5 TESI Diagnostic bilateral L2-3 transforaminal ESI Diagnostic bilateral lumbar facet MBB #3 Diagnostic cervical facet MBB Diagnostic midlineCervicalESI   Completed:   Diagnostic bilateral lumbar facet MBB x2 (10/30/2018) (100/100/100/25) Therapeutic midline-right L2-3 LESI x2 (07/17/2018) (100/100/100/75-100) (100/100/90/50)  Therapeutic right L3-4 LESI x2 (02/27/2018) (80/80/80/>50)   Therapeutic  Palliative (PRN) options:   Palliativeright L3-4 LESI #3    Recent Visits Date Type Provider Dept  02/16/21 Procedure visit NMilinda Pointer MD [facility]  01/26/21 Office Visit NMilinda Pointer MD [facility]  Showing recent visits within past 90 days and meeting all other requirements Today's Visits Date Type Provider Dept  03/04/21 Telemedicine NMilinda Pointer MD [facility]  Showing today's visits and meeting all other requirements Future Appointments No visits were found meeting these conditions. Showing future appointments within next 90 days and meeting all other requirements  I discussed the assessment and treatment plan with the patient. The patient was provided an opportunity to ask questions and all were answered. The patient agreed with the plan and demonstrated an understanding of the instructions.  Patient advised to call back or seek an in-person evaluation if the symptoms or condition worsens.  Duration of encounter: 20 minutes.  Note by: FGaspar Cola MD Date: 03/04/2021; Time: 4:28 PM

## 2021-03-04 ENCOUNTER — Ambulatory Visit: Payer: Medicare Other | Attending: Pain Medicine | Admitting: Pain Medicine

## 2021-03-04 ENCOUNTER — Other Ambulatory Visit: Payer: Self-pay

## 2021-03-04 DIAGNOSIS — M545 Low back pain, unspecified: Secondary | ICD-10-CM

## 2021-03-04 DIAGNOSIS — M47816 Spondylosis without myelopathy or radiculopathy, lumbar region: Secondary | ICD-10-CM

## 2021-03-04 DIAGNOSIS — M5136 Other intervertebral disc degeneration, lumbar region: Secondary | ICD-10-CM | POA: Diagnosis not present

## 2021-03-04 DIAGNOSIS — G8929 Other chronic pain: Secondary | ICD-10-CM

## 2021-03-04 DIAGNOSIS — M4310 Spondylolisthesis, site unspecified: Secondary | ICD-10-CM

## 2021-03-04 DIAGNOSIS — S32030A Wedge compression fracture of third lumbar vertebra, initial encounter for closed fracture: Secondary | ICD-10-CM

## 2021-03-04 NOTE — Patient Instructions (Signed)
____________________________________________________________________________________________  Preparing for Procedure with Sedation  Procedure appointments are limited to planned procedures: . No Prescription Refills. . No disability issues will be discussed. . No medication changes will be discussed.  Instructions: . Oral Intake: Do not eat or drink anything for at least 8 hours prior to your procedure. (Exception: Blood Pressure Medication. See below.) . Transportation: Unless otherwise stated by your physician, you may drive yourself after the procedure. . Blood Pressure Medicine: Do not forget to take your blood pressure medicine with a sip of water the morning of the procedure. If your Diastolic (lower reading)is above 100 mmHg, elective cases will be cancelled/rescheduled. . Blood thinners: These will need to be stopped for procedures. Notify our staff if you are taking any blood thinners. Depending on which one you take, there will be specific instructions on how and when to stop it. . Diabetics on insulin: Notify the staff so that you can be scheduled 1st case in the morning. If your diabetes requires high dose insulin, take only  of your normal insulin dose the morning of the procedure and notify the staff that you have done so. . Preventing infections: Shower with an antibacterial soap the morning of your procedure. . Build-up your immune system: Take 1000 mg of Vitamin C with every meal (3 times a day) the day prior to your procedure. . Antibiotics: Inform the staff if you have a condition or reason that requires you to take antibiotics before dental procedures. . Pregnancy: If you are pregnant, call and cancel the procedure. . Sickness: If you have a cold, fever, or any active infections, call and cancel the procedure. . Arrival: You must be in the facility at least 30 minutes prior to your scheduled procedure. . Children: Do not bring children with you. . Dress appropriately:  Bring dark clothing that you would not mind if they get stained. . Valuables: Do not bring any jewelry or valuables.  Reasons to call and reschedule or cancel your procedure: (Following these recommendations will minimize the risk of a serious complication.) . Surgeries: Avoid having procedures within 2 weeks of any surgery. (Avoid for 2 weeks before or after any surgery). . Flu Shots: Avoid having procedures within 2 weeks of a flu shots or . (Avoid for 2 weeks before or after immunizations). . Barium: Avoid having a procedure within 7-10 days after having had a radiological study involving the use of radiological contrast. (Myelograms, Barium swallow or enema study). . Heart attacks: Avoid any elective procedures or surgeries for the initial 6 months after a "Myocardial Infarction" (Heart Attack). . Blood thinners: It is imperative that you stop these medications before procedures. Let us know if you if you take any blood thinner.  . Infection: Avoid procedures during or within two weeks of an infection (including chest colds or gastrointestinal problems). Symptoms associated with infections include: Localized redness, fever, chills, night sweats or profuse sweating, burning sensation when voiding, cough, congestion, stuffiness, runny nose, sore throat, diarrhea, nausea, vomiting, cold or Flu symptoms, recent or current infections. It is specially important if the infection is over the area that we intend to treat. . Heart and lung problems: Symptoms that may suggest an active cardiopulmonary problem include: cough, chest pain, breathing difficulties or shortness of breath, dizziness, ankle swelling, uncontrolled high or unusually low blood pressure, and/or palpitations. If you are experiencing any of these symptoms, cancel your procedure and contact your primary care physician for an evaluation.  Remember:  Regular Business hours are:    Monday to Thursday 8:00 AM to 4:00 PM  Provider's  Schedule: Ciela Mahajan, MD:  Procedure days: Tuesday and Thursday 7:30 AM to 4:00 PM  Bilal Lateef, MD:  Procedure days: Monday and Wednesday 7:30 AM to 4:00 PM ____________________________________________________________________________________________   ____________________________________________________________________________________________  General Risks and Possible Complications  Patient Responsibilities: It is important that you read this as it is part of your informed consent. It is our duty to inform you of the risks and possible complications associated with treatments offered to you. It is your responsibility as a patient to read this and to ask questions about anything that is not clear or that you believe was not covered in this document.  Patient's Rights: You have the right to refuse treatment. You also have the right to change your mind, even after initially having agreed to have the treatment done. However, under this last option, if you wait until the last second to change your mind, you may be charged for the materials used up to that point.  Introduction: Medicine is not an exact science. Everything in Medicine, including the lack of treatment(s), carries the potential for danger, harm, or loss (which is by definition: Risk). In Medicine, a complication is a secondary problem, condition, or disease that can aggravate an already existing one. All treatments carry the risk of possible complications. The fact that a side effects or complications occurs, does not imply that the treatment was conducted incorrectly. It must be clearly understood that these can happen even when everything is done following the highest safety standards.  No treatment: You can choose not to proceed with the proposed treatment alternative. The "PRO(s)" would include: avoiding the risk of complications associated with the therapy. The "CON(s)" would include: not getting any of the treatment  benefits. These benefits fall under one of three categories: diagnostic; therapeutic; and/or palliative. Diagnostic benefits include: getting information which can ultimately lead to improvement of the disease or symptom(s). Therapeutic benefits are those associated with the successful treatment of the disease. Finally, palliative benefits are those related to the decrease of the primary symptoms, without necessarily curing the condition (example: decreasing the pain from a flare-up of a chronic condition, such as incurable terminal cancer).  General Risks and Complications: These are associated to most interventional treatments. They can occur alone, or in combination. They fall under one of the following six (6) categories: no benefit or worsening of symptoms; bleeding; infection; nerve damage; allergic reactions; and/or death. 1. No benefits or worsening of symptoms: In Medicine there are no guarantees, only probabilities. No healthcare provider can ever guarantee that a medical treatment will work, they can only state the probability that it may. Furthermore, there is always the possibility that the condition may worsen, either directly, or indirectly, as a consequence of the treatment. 2. Bleeding: This is more common if the patient is taking a blood thinner, either prescription or over the counter (example: Goody Powders, Fish oil, Aspirin, Garlic, etc.), or if suffering a condition associated with impaired coagulation (example: Hemophilia, cirrhosis of the liver, low platelet counts, etc.). However, even if you do not have one on these, it can still happen. If you have any of these conditions, or take one of these drugs, make sure to notify your treating physician. 3. Infection: This is more common in patients with a compromised immune system, either due to disease (example: diabetes, cancer, human immunodeficiency virus [HIV], etc.), or due to medications or treatments (example: therapies used to treat  cancer and   rheumatological diseases). However, even if you do not have one on these, it can still happen. If you have any of these conditions, or take one of these drugs, make sure to notify your treating physician. 4. Nerve Damage: This is more common when the treatment is an invasive one, but it can also happen with the use of medications, such as those used in the treatment of cancer. The damage can occur to small secondary nerves, or to large primary ones, such as those in the spinal cord and brain. This damage may be temporary or permanent and it may lead to impairments that can range from temporary numbness to permanent paralysis and/or brain death. 5. Allergic Reactions: Any time a substance or material comes in contact with our body, there is the possibility of an allergic reaction. These can range from a mild skin rash (contact dermatitis) to a severe systemic reaction (anaphylactic reaction), which can result in death. 6. Death: In general, any medical intervention can result in death, most of the time due to an unforeseen complication. ____________________________________________________________________________________________   

## 2021-05-10 NOTE — Progress Notes (Deleted)
NO SHOW

## 2021-05-11 ENCOUNTER — Ambulatory Visit: Payer: Medicare Other | Attending: Pain Medicine | Admitting: Pain Medicine

## 2021-07-26 NOTE — Progress Notes (Deleted)
The patient did not show up for appointment.  We received a call from her son indicating that she is afraid of the procedure and decided not to show up.  This is the second time that she does this.  The first 1 was on 05/11/2021.  We will no longer schedule her for radiofrequency ablations.

## 2021-07-27 ENCOUNTER — Ambulatory Visit: Payer: Medicare Other | Admitting: Pain Medicine

## 2021-07-27 DIAGNOSIS — M5136 Other intervertebral disc degeneration, lumbar region: Secondary | ICD-10-CM

## 2021-07-27 DIAGNOSIS — M47816 Spondylosis without myelopathy or radiculopathy, lumbar region: Secondary | ICD-10-CM

## 2021-07-27 DIAGNOSIS — G8929 Other chronic pain: Secondary | ICD-10-CM

## 2021-08-23 ENCOUNTER — Encounter: Payer: Self-pay | Admitting: *Deleted

## 2021-09-13 ENCOUNTER — Ambulatory Visit: Payer: Self-pay | Admitting: Urology

## 2022-02-13 ENCOUNTER — Emergency Department: Payer: Medicare Other

## 2022-02-13 ENCOUNTER — Other Ambulatory Visit: Payer: Self-pay

## 2022-02-13 ENCOUNTER — Inpatient Hospital Stay
Admission: EM | Admit: 2022-02-13 | Discharge: 2022-02-21 | DRG: 871 | Disposition: A | Discharge status: Skilled Nursing Facility | Payer: Medicare Other | Attending: Internal Medicine | Admitting: Internal Medicine

## 2022-02-13 DIAGNOSIS — R109 Unspecified abdominal pain: Secondary | ICD-10-CM | POA: Diagnosis present

## 2022-02-13 DIAGNOSIS — Z79899 Other long term (current) drug therapy: Secondary | ICD-10-CM

## 2022-02-13 DIAGNOSIS — K5780 Diverticulitis of intestine, part unspecified, with perforation and abscess without bleeding: Secondary | ICD-10-CM | POA: Diagnosis present

## 2022-02-13 DIAGNOSIS — K5720 Diverticulitis of large intestine with perforation and abscess without bleeding: Secondary | ICD-10-CM | POA: Diagnosis present

## 2022-02-13 DIAGNOSIS — J9601 Acute respiratory failure with hypoxia: Secondary | ICD-10-CM | POA: Diagnosis present

## 2022-02-13 DIAGNOSIS — A419 Sepsis, unspecified organism: Principal | ICD-10-CM | POA: Diagnosis present

## 2022-02-13 DIAGNOSIS — I10 Essential (primary) hypertension: Secondary | ICD-10-CM | POA: Diagnosis present

## 2022-02-13 DIAGNOSIS — M47816 Spondylosis without myelopathy or radiculopathy, lumbar region: Secondary | ICD-10-CM | POA: Diagnosis present

## 2022-02-13 DIAGNOSIS — Z885 Allergy status to narcotic agent status: Secondary | ICD-10-CM

## 2022-02-13 DIAGNOSIS — K219 Gastro-esophageal reflux disease without esophagitis: Secondary | ICD-10-CM | POA: Diagnosis present

## 2022-02-13 DIAGNOSIS — Z981 Arthrodesis status: Secondary | ICD-10-CM

## 2022-02-13 DIAGNOSIS — Z7982 Long term (current) use of aspirin: Secondary | ICD-10-CM

## 2022-02-13 DIAGNOSIS — E876 Hypokalemia: Secondary | ICD-10-CM | POA: Diagnosis not present

## 2022-02-13 DIAGNOSIS — K5792 Diverticulitis of intestine, part unspecified, without perforation or abscess without bleeding: Secondary | ICD-10-CM | POA: Diagnosis present

## 2022-02-13 DIAGNOSIS — I739 Peripheral vascular disease, unspecified: Secondary | ICD-10-CM | POA: Diagnosis present

## 2022-02-13 DIAGNOSIS — D649 Anemia, unspecified: Secondary | ICD-10-CM | POA: Diagnosis present

## 2022-02-13 DIAGNOSIS — Z85820 Personal history of malignant melanoma of skin: Secondary | ICD-10-CM

## 2022-02-13 DIAGNOSIS — J449 Chronic obstructive pulmonary disease, unspecified: Secondary | ICD-10-CM | POA: Diagnosis present

## 2022-02-13 DIAGNOSIS — R6520 Severe sepsis without septic shock: Secondary | ICD-10-CM | POA: Diagnosis present

## 2022-02-13 DIAGNOSIS — I5032 Chronic diastolic (congestive) heart failure: Secondary | ICD-10-CM | POA: Diagnosis present

## 2022-02-13 DIAGNOSIS — Z881 Allergy status to other antibiotic agents status: Secondary | ICD-10-CM

## 2022-02-13 DIAGNOSIS — Z20822 Contact with and (suspected) exposure to covid-19: Secondary | ICD-10-CM | POA: Diagnosis present

## 2022-02-13 DIAGNOSIS — G8929 Other chronic pain: Secondary | ICD-10-CM | POA: Diagnosis present

## 2022-02-13 DIAGNOSIS — I110 Hypertensive heart disease with heart failure: Secondary | ICD-10-CM | POA: Diagnosis present

## 2022-02-13 DIAGNOSIS — F32A Depression, unspecified: Secondary | ICD-10-CM | POA: Diagnosis present

## 2022-02-13 DIAGNOSIS — Z87891 Personal history of nicotine dependence: Secondary | ICD-10-CM

## 2022-02-13 DIAGNOSIS — E039 Hypothyroidism, unspecified: Secondary | ICD-10-CM | POA: Diagnosis present

## 2022-02-13 DIAGNOSIS — R1032 Left lower quadrant pain: Secondary | ICD-10-CM

## 2022-02-13 DIAGNOSIS — Z9851 Tubal ligation status: Secondary | ICD-10-CM

## 2022-02-13 DIAGNOSIS — Z888 Allergy status to other drugs, medicaments and biological substances status: Secondary | ICD-10-CM

## 2022-02-13 DIAGNOSIS — Z79890 Hormone replacement therapy (postmenopausal): Secondary | ICD-10-CM

## 2022-02-13 DIAGNOSIS — Z9049 Acquired absence of other specified parts of digestive tract: Secondary | ICD-10-CM

## 2022-02-13 LAB — RESP PANEL BY RT-PCR (FLU A&B, COVID) ARPGX2
Influenza A by PCR: NEGATIVE
Influenza B by PCR: NEGATIVE
SARS Coronavirus 2 by RT PCR: NEGATIVE

## 2022-02-13 LAB — CBC WITH DIFFERENTIAL/PLATELET
Abs Immature Granulocytes: 0.13 10*3/uL — ABNORMAL HIGH (ref 0.00–0.07)
Basophils Absolute: 0.1 10*3/uL (ref 0.0–0.1)
Basophils Relative: 0 %
Eosinophils Absolute: 0.1 10*3/uL (ref 0.0–0.5)
Eosinophils Relative: 0 %
HCT: 39.3 % (ref 36.0–46.0)
Hemoglobin: 12.4 g/dL (ref 12.0–15.0)
Immature Granulocytes: 1 %
Lymphocytes Relative: 6 %
Lymphs Abs: 1.4 10*3/uL (ref 0.7–4.0)
MCH: 20.4 pg — ABNORMAL LOW (ref 26.0–34.0)
MCHC: 31.6 g/dL (ref 30.0–36.0)
MCV: 64.5 fL — ABNORMAL LOW (ref 80.0–100.0)
Monocytes Absolute: 1.4 10*3/uL — ABNORMAL HIGH (ref 0.1–1.0)
Monocytes Relative: 6 %
Neutro Abs: 19.4 10*3/uL — ABNORMAL HIGH (ref 1.7–7.7)
Neutrophils Relative %: 87 %
Platelets: 279 10*3/uL (ref 150–400)
RBC: 6.09 MIL/uL — ABNORMAL HIGH (ref 3.87–5.11)
RDW: 17.4 % — ABNORMAL HIGH (ref 11.5–15.5)
WBC: 22.4 10*3/uL — ABNORMAL HIGH (ref 4.0–10.5)
nRBC: 0.1 % (ref 0.0–0.2)

## 2022-02-13 LAB — COMPREHENSIVE METABOLIC PANEL
ALT: 18 U/L (ref 0–44)
AST: 29 U/L (ref 15–41)
Albumin: 3.9 g/dL (ref 3.5–5.0)
Alkaline Phosphatase: 79 U/L (ref 38–126)
Anion gap: 11 (ref 5–15)
BUN: 14 mg/dL (ref 8–23)
CO2: 25 mmol/L (ref 22–32)
Calcium: 9.2 mg/dL (ref 8.9–10.3)
Chloride: 101 mmol/L (ref 98–111)
Creatinine, Ser: 0.66 mg/dL (ref 0.44–1.00)
GFR, Estimated: >60 mL/min (ref >60)
Glucose, Bld: 125 mg/dL — ABNORMAL HIGH (ref 70–99)
Potassium: 4.1 mmol/L (ref 3.5–5.1)
Sodium: 137 mmol/L (ref 135–145)
Total Bilirubin: 1.2 mg/dL (ref 0.3–1.2)
Total Protein: 7.1 g/dL (ref 6.5–8.1)

## 2022-02-13 LAB — LACTIC ACID, PLASMA: Lactic Acid, Venous: 2.1 mmol/L (ref 0.5–1.9)

## 2022-02-13 MED ORDER — SODIUM CHLORIDE 0.9 % IV BOLUS
1000.0000 mL | Freq: Once | INTRAVENOUS | Status: AC
  Administered 2022-02-13: 1000 mL via INTRAVENOUS

## 2022-02-13 MED ORDER — SODIUM CHLORIDE 0.9 % IV SOLN
2.0000 g | Freq: Two times a day (BID) | INTRAVENOUS | Status: DC
  Administered 2022-02-13 – 2022-02-21 (×16): 2 g via INTRAVENOUS
  Filled 2022-02-13 (×2): qty 12.5
  Filled 2022-02-13 (×2): qty 2
  Filled 2022-02-13 (×3): qty 12.5
  Filled 2022-02-13: qty 2
  Filled 2022-02-13 (×3): qty 12.5
  Filled 2022-02-13 (×5): qty 2

## 2022-02-13 MED ORDER — METRONIDAZOLE 500 MG/100ML IV SOLN
500.0000 mg | Freq: Once | INTRAVENOUS | Status: AC
Start: 2022-02-13 — End: 2022-02-14
  Administered 2022-02-13: 500 mg via INTRAVENOUS
  Filled 2022-02-13: qty 100

## 2022-02-13 MED ORDER — IOHEXOL 300 MG/ML  SOLN
100.0000 mL | Freq: Once | INTRAMUSCULAR | Status: AC | PRN
  Administered 2022-02-13: 100 mL via INTRAVENOUS
  Filled 2022-02-13: qty 100

## 2022-02-13 NOTE — Progress Notes (Signed)
Pharmacy Antibiotic Note ? ?Laura Frey is a 88 y.o. female admitted on 02/13/2022 with  perforated diverticulitis .  Pharmacy has been consulted for Cefepime dosing. ? ?Plan: ?Cefepime 2 gm IV Q12H ordered to start on 4/30 @ 2300.  ? ?Height: '5\' 2"'$  (157.5 cm) ?Weight: 73 kg (160 lb 15 oz) ?IBW/kg (Calculated) : 50.1 ? ?Temp (24hrs), Avg:98.6 ?F (37 ?C), Min:98.6 ?F (37 ?C), Max:98.6 ?F (37 ?C) ? ?Recent Labs  ?Lab 02/13/22 ?2045  ?WBC 22.4*  ?CREATININE 0.66  ?LATICACIDVEN 2.1*  ?  ?Estimated Creatinine Clearance: 46.4 mL/min (by C-G formula based on SCr of 0.66 mg/dL).   ? ?Allergies  ?Allergen Reactions  ? Ciprofloxacin Swelling and Rash  ? Hydrocodone   ?  hallucinations  ? Oxycodone   ?  hallucinations  ? Zoloft [Sertraline Hcl]   ?  Burns stomach  ? Sertraline Other (See Comments)  ?  "Burns stomach"  ? Sulfa Antibiotics   ?  Blisters in mouth  ? Tylenol [Acetaminophen]   ?  headaches  ? Ultram [Tramadol]   ?  headaches  ? ? ?Antimicrobials this admission: ?  >>  ?  >>  ? ?Dose adjustments this admission: ? ? ?Microbiology results: ? BCx:  ? UCx:   ? Sputum:   ? MRSA PCR:  ? ?Thank you for allowing pharmacy to be a part of this patient?s care. ? ?Lempi Edwin D ?02/13/2022 10:41 PM ? ?

## 2022-02-13 NOTE — H&P (Signed)
?History and Physical  ? ? ?Patient: Laura Frey XFG:[id number] DOB: 1931/11/30 ?DOA: 02/13/2022 ?DOS: the patient was seen and examined on 02/14/2022 ?PCP: Marinda Elk, MD  ?Patient coming from: Home ? ?Chief Complaint:  ?Chief Complaint  ?Patient presents with  ? Back Pain  ? ?HPI: Laura Frey is a 88 y.o. female with medical history significant of CHF, COPD, Htn, and c/h back pain coming for rt sided lower back pain and left lower quadrant pain as well. The LLQ pain started after she slid off the from commode  she did not fall or have LOC or hit her head or injure herself in anyway and that was today. ?Pt also reported few days of diarrhea. ?In ed pt meets sepsis criteria.  ?In ed her oxygen was 89%. ?Ct imaging showed perforated diverticulitis.  ?Gen Surg Dr.Rodenburg will see pt in am.  ?Labs shows elevated wbc count. ? ?Review of Systems  ?Gastrointestinal:  Positive for abdominal pain.  ?All other systems reviewed and are negative. ? ?Past Medical History:  ?Diagnosis Date  ? Anemia   ? hx Thalassemia minor anemia  ? Asthma   ? Cancer [facility])   ? melanoma on back  ? Chronic back pain   ? ESI/uses BC powders  ? Depression   ? GERD (gastroesophageal reflux disease)   ? Headache(784.0)   ? Hypertension   ? Hypothyroidism   ? Peripheral vascular disease ([place])   ? legs  ? Sepsis ([place]) 01/27/2014  ? Shortness of breath   ? Tendinitis of both rotator cuffs   ? UTI (urinary tract infection)   ? ?Past Surgical History:  ?Procedure Laterality Date  ? ABDOMINAL HYSTERECTOMY    ? vagina  ? ANTERIOR CERVICAL DECOMP/DISCECTOMY FUSION N/A 05/01/2013  ? Procedure: ANTERIOR CERVICAL DECOMPRESSION/DISCECTOMY FUSION 3 LEVELS;  Surgeon: Elaina Hoops, MD;  Location: [facility];  Service: Neurosurgery;  Laterality: N/A;  ANTERIOR CERVICAL DECOMPRESSION/DISCECTOMY FUSION 3 LEVELS  ? APPENDECTOMY    ? BLADDER REPAIR    ? after hysterectomy same day  ? EYE SURGERY Bilateral   ? cataracts  ? TONSILLECTOMY    ? TUBAL  LIGATION    ? ?Social History:  reports that she quit smoking about 26 years ago. Her smoking use included cigarettes. She has a 30.00 pack-year smoking history. She has never used smokeless tobacco. She reports that she does not drink alcohol and does not use drugs. ? ?Allergies  ?Allergen Reactions  ? Ciprofloxacin Swelling and Rash  ? Hydrocodone   ?  hallucinations  ? Oxycodone   ?  hallucinations  ? Zoloft [Sertraline Hcl]   ?  Burns stomach  ? Sertraline Other (See Comments)  ?  "Burns stomach"  ? Sulfa Antibiotics   ?  Blisters in mouth  ? Tylenol [Acetaminophen]   ?  headaches  ? Ultram [Tramadol]   ?  headaches  ? ? ?Family History  ?Problem Relation Age of Onset  ? Thalassemia Son   ? Thalassemia Paternal Aunt   ? ? ?Prior to Admission medications   ?Medication Sig Start Date End Date Taking? Authorizing Provider  ?albuterol (PROVENTIL HFA;VENTOLIN HFA) 108 (90 BASE) MCG/ACT inhaler Inhale 2 puffs into the lungs every 6 (six) hours as needed for wheezing.    [provider]  ?amLODipine (NORVASC) 5 MG tablet Take 2 tablets (10 mg total) by mouth daily. 02/04/14   Nita Sells, MD  ?DULoxetine (CYMBALTA) 60 MG capsule  05/14/18   [provider]  ?fexofenadine (ALLEGRA) 180 MG tablet Take 180 mg by mouth daily.    [provider]  ?fosfomycin (MONUROL) 3 g PACK Take by mouth. 01/15/21   [provider]  ?furosemide (LASIX) 20 MG tablet Take 1 tablet (20 mg total) by mouth daily. 02/08/14   Domenic Polite, MD  ?guaifenesin (ROBITUSSIN) 100 MG/5ML syrup Take 200 mg by mouth 3 (three) times daily as needed for cough.    [provider]  ?levothyroxine (SYNTHROID, LEVOTHROID) 75 MCG tablet Take 75 mcg by mouth daily before breakfast.    [provider]  ?loperamide (IMODIUM) 2 MG capsule Take 1 capsule (2 mg total) by mouth 2 (two) times daily as needed for diarrhea or loose stools. 02/08/14   Domenic Polite, MD  ?LORazepam (ATIVAN) 0.5 MG tablet Take by  mouth. 06/20/18   [provider]  ?magnesium oxide (MAG-OX) 400 MG tablet Take 400 mg by mouth daily.    [provider]  ?metoprolol (TOPROL-XL) 200 MG 24 hr tablet Take 200 mg by mouth daily.    [provider]  ?Multiple Vitamin (MULTIVITAMIN WITH MINERALS) TABS Take 1 tablet by mouth daily.    [provider]  ?nitrofurantoin, macrocrystal-monohydrate, (MACROBID) 100 MG capsule Take 1 capsule (100 mg total) by mouth every 12 (twelve) hours. 02/01/21   Bjorn Loser, MD  ?omeprazole (PRILOSEC) 20 MG capsule Take 20 mg by mouth daily.    [provider]  ?potassium chloride SA (K-DUR,KLOR-CON) 20 MEQ tablet Take 2 tablets (40 mEq total) by mouth 2 (two) times daily with a meal. 02/04/14   Nita Sells, MD  ?solifenacin (VESICARE) 5 MG tablet Take 5 mg by mouth daily.    [provider]  ?vitamin C (ASCORBIC ACID) 500 MG tablet Take 500 mg by mouth daily.    [provider]  ?VITAMIN E PO Take 500 Units by mouth daily.    [provider]  ?Vitamins/Minerals TABS Take by mouth.    [provider]  ? ? ?Physical Exam: ?Vitals:  ? 02/13/22 2130 02/13/22 2309 02/13/22 2330 02/14/22 0010  ?BP: 139/80 (!) 124/51 124/62 136/61  ?Pulse: 91 90 92 93  ?Resp: '18 16  16  '$ ?Temp:      ?TempSrc:      ?SpO2: 94% 96% 97% 98%  ?Weight:      ?Height:      ?Physical Exam ?Vitals and nursing note reviewed.  ?Constitutional:   ?   General: She is not in acute distress. ?   Appearance: Normal appearance. She is not ill-appearing, toxic-appearing or diaphoretic.  ?HENT:  ?   Head: Normocephalic and atraumatic.  ?   Right Ear: Hearing and external ear normal.  ?   Left Ear: Hearing and external ear normal.  ?   Nose: Nose normal. No nasal deformity.  ?   Mouth/Throat:  ?   Lips: Pink.  ?   Mouth: Mucous membranes are moist.  ?   Tongue: No lesions.  ?   Pharynx: Oropharynx is clear.  ?Eyes:  ?   Extraocular Movements: Extraocular movements intact.  ?    Pupils: Pupils are equal, round, and reactive to light.  ?Neck:  ?   Vascular: No carotid bruit.  ?Cardiovascular:  ?   Rate and Rhythm: Normal rate and regular rhythm.  ?   Pulses: Normal pulses.  ?   Heart sounds: Normal heart sounds.  ?Pulmonary:  ?   Effort: Pulmonary effort is normal.  ?  Breath sounds: Normal breath sounds.  ?Abdominal:  ?   General: Bowel sounds are normal. There is no distension.  ?   Palpations: Abdomen is soft. There is no mass.  ?   Tenderness: There is no abdominal tenderness. There is no guarding.  ?   Hernia: No hernia is present.  ?Musculoskeletal:  ?   Right lower leg: No edema.  ?   Left lower leg: No edema.  ?Skin: ?   General: Skin is warm.  ?Neurological:  ?   General: No focal deficit present.  ?   Mental Status: She is alert and oriented to person, place, and time.  ?   Cranial Nerves: Cranial nerves 2-12 are intact.  ?   Motor: Motor function is intact.  ?Psychiatric:     ?   Attention and Perception: Attention normal.     ?   Mood and Affect: Mood normal.     ?   Speech: Speech normal.     ?   Behavior: Behavior normal. Behavior is cooperative.     ?   Cognition and Memory: Cognition normal.  ? ? ?Data Reviewed: ?Results for orders placed or performed during the hospital encounter of 02/13/22 (from the past 24 hour(s))  ?CBC with Differential     Status: Abnormal  ? Collection Time: 02/13/22  8:45 PM  ?Result Value Ref Range  ? WBC 22.4 (H) 4.0 - 10.5 K/uL  ? RBC 6.09 (H) 3.87 - 5.11 MIL/uL  ? Hemoglobin 12.4 12.0 - 15.0 g/dL  ? HCT 39.3 36.0 - 46.0 %  ? MCV 64.5 (L) 80.0 - 100.0 fL  ? MCH 20.4 (L) 26.0 - 34.0 pg  ? MCHC 31.6 30.0 - 36.0 g/dL  ? RDW 17.4 (H) 11.5 - 15.5 %  ? Platelets 279 150 - 400 K/uL  ? nRBC 0.1 0.0 - 0.2 %  ? Neutrophils Relative % 87 %  ? Neutro Abs 19.4 (H) 1.7 - 7.7 K/uL  ? Lymphocytes Relative 6 %  ? Lymphs Abs 1.4 0.7 - 4.0 K/uL  ? Monocytes Relative 6 %  ? Monocytes Absolute 1.4 (H) 0.1 - 1.0 K/uL  ? Eosinophils Relative 0 %  ? Eosinophils  Absolute 0.1 0.0 - 0.5 K/uL  ? Basophils Relative 0 %  ? Basophils Absolute 0.1 0.0 - 0.1 K/uL  ? Immature Granulocytes 1 %  ? Abs Immature Granulocytes 0.13 (H) 0.00 - 0.07 K/uL  ?Comprehensive metabolic panel

## 2022-02-13 NOTE — ED Provider Notes (Signed)
? ? ?[facility] ?Emergency Department Provider Note ? ? ? ? Event Date/Time  ? First MD Initiated Contact with Patient 02/13/22 1830   ?  (approximate) ? ? ?History  ? ?Back Pain ? ? ?HPI ? ?Laura Frey is a 88 y.o. female with a history of chronic pain of the cervical and lumbar spine, hypertension, hypoxia, CHF, COPD, with no current oxygen supplementation, presents to the ED for evaluation for complaints including acute on chronic right-sided low back pain as well as some left lower quadrant abdominal pain.  The patient reports onset of the acute left lower quadrant pain after she was sitting on the toilet and slipped without falling on the loose toilet seat.  She denies any outright fall or abdominal trauma at that time.  Patient is in the ED accompanied by her adult daughter, who provides some interim daily care.  The patient and daughter would endorse that patient has had intermittent episodes of diarrhea as well as some generalized weakness for the last week or so.  She denies any falls, trauma, fever, chills, or sweats.  She does endorse an intermittently productive cough and voice some concern for possible pneumonia. ?  ? ? ?Physical Exam  ? ?Triage Vital Signs: ?ED Triage Vitals  ?Enc Vitals Group  ?   BP 02/13/22 1824 138/61  ?   Pulse Rate 02/13/22 1824 93  ?   Resp 02/13/22 1824 18  ?   Temp 02/13/22 1824 98.6 ?F (37 ?C)  ?   Temp Source 02/13/22 1824 Oral  ?   SpO2 02/13/22 1828 92 %  ?   Weight 02/13/22 1826 160 lb 15 oz (73 kg)  ?   Height 02/13/22 1826 '5\' 2"'$  (1.575 m)  ?   Head Circumference --   ?   Peak Flow --   ?   Pain Score 02/13/22 1826 10  ?   Pain Loc --   ?   Pain Edu? --   ?   Excl. in [place]? --   ? ? ?Most recent vital signs: ?Vitals:  ? 02/13/22 2130 02/13/22 2309  ?BP: 139/80 (!) 124/51  ?Pulse: 91 90  ?Resp: 18 16  ?Temp:    ?SpO2: 94% 96%  ? ? ?General Awake, no distress.  ?CV:  Good peripheral perfusion.  ?RESP:  Normal effort. CTA ?ABD:  No distention.  Soft, but tender to palp over the LLQ.  ?MSK:  Normal LE ROM ? ? ?ED Results / Procedures / Treatments  ? ?Labs ?(all labs ordered are listed, but only abnormal results are displayed) ?Labs Reviewed  ?CBC WITH DIFFERENTIAL/PLATELET - Abnormal; Notable for the following components:  ?    Result Value  ? WBC 22.4 (*)   ? RBC 6.09 (*)   ? MCV 64.5 (*)   ? MCH 20.4 (*)   ? RDW 17.4 (*)   ? Neutro Abs 19.4 (*)   ? Monocytes Absolute 1.4 (*)   ? Abs Immature Granulocytes 0.13 (*)   ? All other components within normal limits  ?COMPREHENSIVE METABOLIC PANEL - Abnormal; Notable for the following components:  ? Glucose, Bld 125 (*)   ? All other components within normal limits  ?LACTIC ACID, PLASMA - Abnormal; Notable for the following components:  ? Lactic Acid, Venous 2.1 (*)   ? All other components within normal limits  ?RESP PANEL BY RT-PCR (FLU A&B, COVID) [id number]  ?CULTURE, BLOOD (ROUTINE X 2)  ?CULTURE, BLOOD (ROUTINE X 2)  ?  URINALYSIS, ROUTINE W REFLEX MICROSCOPIC  ?LACTIC ACID, PLASMA  ?TYPE AND SCREEN  ? ? ? ?EKG ? ? ? ?RADIOLOGY ? ?I personally viewed and evaluated these images as part of my medical decision making, as well as reviewing the written report by the radiologist. ? ?ED Provider Interpretation: free air noted left hemidiaphragm} ? ?CT ABDOMEN PELVIS W CONTRAST ? ?Result Date: 02/13/2022 ?CLINICAL DATA:  Free air under the right hemidiaphragm on recent chest x-ray EXAM: CT ABDOMEN AND PELVIS WITH CONTRAST TECHNIQUE: Multidetector CT imaging of the abdomen and pelvis was performed using the standard protocol following bolus administration of intravenous contrast. RADIATION DOSE REDUCTION: This exam was performed according to the departmental dose-optimization program which includes automated exposure control, adjustment of the mA and/or kV according to patient size and/or use of iterative reconstruction technique. CONTRAST:  127m OMNIPAQUE IOHEXOL 300 MG/ML  SOLN COMPARISON:  Chest x-ray from earlier in  the same day. FINDINGS: Lower chest: Lung bases show no focal infiltrate or sizable effusion. Hepatobiliary: Liver is well visualized and within normal limits. Gallbladder is well distended with multiple gallstones within. No biliary ductal dilatation is seen. Pancreas: Unremarkable. No pancreatic ductal dilatation or surrounding inflammatory changes. Spleen: Normal in size without focal abnormality. Adrenals/Urinary Tract: Adrenal glands are within normal limits. Kidneys demonstrate a normal enhancement pattern bilaterally. Subcentimeter hypodensity is noted in the right kidney inferiorly likely representing a small cyst. No follow-up is recommended. No obstructive changes are seen. The bladder is partially distended. Stomach/Bowel: Colon shows evidence of diverticular change is well as diverticulitis at the junction of the descending and sigmoid colons. Some small foci of extraluminal air are noted adjacent to the area of diverticulitis as well as more significant air in the more superior aspect of the abdominal cavity similar to that seen on recent chest x-ray. These changes are most consistent with perforated diverticulitis. No abscess is noted at this time. More proximal colon appears within normal limits. The appendix has been surgically removed. Small bowel and stomach are within normal limits with the exception of a hiatal hernia. Vascular/Lymphatic: Aortic atherosclerosis. No enlarged abdominal or pelvic lymph nodes. Reproductive: Status post hysterectomy. No adnexal masses. Other: No abdominal wall hernia or abnormality. No abdominopelvic ascites. Musculoskeletal: Degenerative changes of lumbar spine are noted. Chronic L2 and L3 compression fractures are noted. IMPRESSION: Changes consistent with acute diverticulitis with perforation and considerable free intraperitoneal air. This corresponds with that seen on recent chest x-ray. Cholelithiasis without complicating factors. Hiatal hernia. Electronically  Signed   By: MInez CatalinaM.D.   On: 02/13/2022 22:11  ? ?DG Chest Portable 1 View ? ?Result Date: 02/13/2022 ?CLINICAL DATA:  Hypoxia EXAM: PORTABLE CHEST 1 VIEW COMPARISON:  02/05/2014 FINDINGS: Cardiac shadow is within normal limits. Aortic calcifications are noted. The lungs are well aerated bilaterally. No focal infiltrate or sizable effusion is seen. There is air beneath the right hemidiaphragm on this frontal film. Postsurgical changes are noted in the cervical spine. Degenerative change of thoracic spine is noted. IMPRESSION: Free air beneath the right hemidiaphragm. CT of the abdomen and pelvis is recommended for further evaluation. Critical Value/emergent results were called by telephone at the time of interpretation on 02/13/2022 at 8:25 pm to [facility] , who verbally acknowledged these results. Electronically Signed   By: MInez CatalinaM.D.   On: 02/13/2022 20:28   ? ? ?PROCEDURES: ? ?Critical Care performed: Yes, see critical care procedure note(s) ? ?Procedures ?CRITICAL CARE ?Performed by: JMelvenia Needles? ? ?  Total critical care time: 45 minutes ? ?Critical care time was exclusive of separately billable procedures and treating other patients. ? ?Critical care was necessary to treat or prevent imminent or life-threatening deterioration. ? ?Critical care was time spent personally by me on the following activities: development of treatment plan with patient and/or surrogate as well as nursing, discussions with consultants, evaluation of patient's response to treatment, examination of patient, obtaining history from patient or surrogate, ordering and performing treatments and interventions, ordering and review of laboratory studies, ordering and review of radiographic studies, pulse oximetry and re-evaluation of patient's condition. ? ? ? ?MEDICATIONS ORDERED IN ED: ?Medications  ?metroNIDAZOLE (FLAGYL) IVPB 500 mg (500 mg Intravenous New Bag/Given 02/13/22 2341)  ?ceFEPIme (MAXIPIME) 2 g in  sodium chloride 0.9 % 100 mL IVPB (0 g Intravenous Stopped 02/13/22 2339)  ?iohexol (OMNIPAQUE) 300 MG/ML solution 100 mL (100 mLs Intravenous Contrast Given 02/13/22 2151)  ?sodium chloride 0.9 % bolus 1,000

## 2022-02-13 NOTE — Progress Notes (Signed)
Reviewed CT imaging, and received hx from ED.   ?Pneumoperitoneum with site of perforated diverticulitis.  ?Adjacent inflammatory change on imaging with localized tenderness.  ?Venous lactate of 2.1  and WBC 22K  ?Elected for trial of IV Abx, bowel rest, and serial exams with low threshold for surgical intervention and colostomy creation pending clinical progress.  ? ?Serial WBC and Venous lactate. Hospitalist admission appreciated. Will follow with you.  ? ?

## 2022-02-13 NOTE — ED Notes (Signed)
Patient returned from CT at this time.

## 2022-02-13 NOTE — ED Notes (Signed)
Patient transported to CT at this time. 

## 2022-02-13 NOTE — ED Triage Notes (Signed)
Pt here with back pain and right hip pain after trying to use an elevated toilet chair.. Pt has a degenerative spinal condition. Pt takes a number of medications. ? ?144/92 ?

## 2022-02-14 ENCOUNTER — Encounter: Payer: Self-pay | Admitting: Internal Medicine

## 2022-02-14 ENCOUNTER — Ambulatory Visit: Payer: Self-pay | Admitting: Urology

## 2022-02-14 ENCOUNTER — Observation Stay: Payer: Medicare Other

## 2022-02-14 DIAGNOSIS — I509 Heart failure, unspecified: Secondary | ICD-10-CM | POA: Insufficient documentation

## 2022-02-14 DIAGNOSIS — K5792 Diverticulitis of intestine, part unspecified, without perforation or abscess without bleeding: Secondary | ICD-10-CM | POA: Diagnosis present

## 2022-02-14 DIAGNOSIS — A419 Sepsis, unspecified organism: Secondary | ICD-10-CM | POA: Diagnosis present

## 2022-02-14 DIAGNOSIS — Z9049 Acquired absence of other specified parts of digestive tract: Secondary | ICD-10-CM | POA: Diagnosis not present

## 2022-02-14 DIAGNOSIS — R6520 Severe sepsis without septic shock: Secondary | ICD-10-CM | POA: Diagnosis present

## 2022-02-14 DIAGNOSIS — Z7982 Long term (current) use of aspirin: Secondary | ICD-10-CM | POA: Diagnosis not present

## 2022-02-14 DIAGNOSIS — Z981 Arthrodesis status: Secondary | ICD-10-CM | POA: Diagnosis not present

## 2022-02-14 DIAGNOSIS — K5720 Diverticulitis of large intestine with perforation and abscess without bleeding: Secondary | ICD-10-CM

## 2022-02-14 DIAGNOSIS — M47816 Spondylosis without myelopathy or radiculopathy, lumbar region: Secondary | ICD-10-CM | POA: Diagnosis present

## 2022-02-14 DIAGNOSIS — I5032 Chronic diastolic (congestive) heart failure: Secondary | ICD-10-CM | POA: Diagnosis present

## 2022-02-14 DIAGNOSIS — J449 Chronic obstructive pulmonary disease, unspecified: Secondary | ICD-10-CM | POA: Diagnosis present

## 2022-02-14 DIAGNOSIS — E039 Hypothyroidism, unspecified: Secondary | ICD-10-CM | POA: Diagnosis present

## 2022-02-14 DIAGNOSIS — G8929 Other chronic pain: Secondary | ICD-10-CM | POA: Diagnosis present

## 2022-02-14 DIAGNOSIS — J9601 Acute respiratory failure with hypoxia: Secondary | ICD-10-CM | POA: Diagnosis present

## 2022-02-14 DIAGNOSIS — Z888 Allergy status to other drugs, medicaments and biological substances status: Secondary | ICD-10-CM | POA: Diagnosis not present

## 2022-02-14 DIAGNOSIS — Z79899 Other long term (current) drug therapy: Secondary | ICD-10-CM | POA: Diagnosis not present

## 2022-02-14 DIAGNOSIS — I110 Hypertensive heart disease with heart failure: Secondary | ICD-10-CM | POA: Diagnosis present

## 2022-02-14 DIAGNOSIS — Z9851 Tubal ligation status: Secondary | ICD-10-CM | POA: Diagnosis not present

## 2022-02-14 DIAGNOSIS — K219 Gastro-esophageal reflux disease without esophagitis: Secondary | ICD-10-CM | POA: Diagnosis present

## 2022-02-14 DIAGNOSIS — F32A Depression, unspecified: Secondary | ICD-10-CM | POA: Diagnosis present

## 2022-02-14 DIAGNOSIS — K5780 Diverticulitis of intestine, part unspecified, with perforation and abscess without bleeding: Secondary | ICD-10-CM | POA: Diagnosis not present

## 2022-02-14 DIAGNOSIS — Z885 Allergy status to narcotic agent status: Secondary | ICD-10-CM | POA: Diagnosis not present

## 2022-02-14 DIAGNOSIS — Z881 Allergy status to other antibiotic agents status: Secondary | ICD-10-CM | POA: Diagnosis not present

## 2022-02-14 DIAGNOSIS — I739 Peripheral vascular disease, unspecified: Secondary | ICD-10-CM | POA: Diagnosis present

## 2022-02-14 DIAGNOSIS — Z20822 Contact with and (suspected) exposure to covid-19: Secondary | ICD-10-CM | POA: Diagnosis present

## 2022-02-14 DIAGNOSIS — Z87891 Personal history of nicotine dependence: Secondary | ICD-10-CM | POA: Diagnosis not present

## 2022-02-14 DIAGNOSIS — R109 Unspecified abdominal pain: Secondary | ICD-10-CM | POA: Diagnosis present

## 2022-02-14 DIAGNOSIS — Z85820 Personal history of malignant melanoma of skin: Secondary | ICD-10-CM | POA: Diagnosis not present

## 2022-02-14 DIAGNOSIS — R1032 Left lower quadrant pain: Secondary | ICD-10-CM | POA: Diagnosis not present

## 2022-02-14 LAB — BASIC METABOLIC PANEL
Anion gap: 5 (ref 5–15)
Anion gap: 9 (ref 5–15)
BUN: 16 mg/dL (ref 8–23)
BUN: 15 mg/dL (ref 8–23)
CO2: 28 mmol/L (ref 22–32)
CO2: 26 mmol/L (ref 22–32)
Calcium: 8.5 mg/dL — ABNORMAL LOW (ref 8.9–10.3)
Calcium: 8.6 mg/dL — ABNORMAL LOW (ref 8.9–10.3)
Chloride: 102 mmol/L (ref 98–111)
Chloride: 100 mmol/L (ref 98–111)
Creatinine, Ser: 0.77 mg/dL (ref 0.44–1.00)
Creatinine, Ser: 0.78 mg/dL (ref 0.44–1.00)
GFR, Estimated: >60 mL/min (ref >60)
GFR, Estimated: >60 mL/min (ref >60)
Glucose, Bld: 95 mg/dL (ref 70–99)
Glucose, Bld: 99 mg/dL (ref 70–99)
Potassium: 4 mmol/L (ref 3.5–5.1)
Potassium: 3.8 mmol/L (ref 3.5–5.1)
Sodium: 135 mmol/L (ref 135–145)
Sodium: 135 mmol/L (ref 135–145)

## 2022-02-14 LAB — CBC
HCT: 34.5 % — ABNORMAL LOW (ref 36.0–46.0)
Hemoglobin: 10.8 g/dL — ABNORMAL LOW (ref 12.0–15.0)
MCH: 20.2 pg — ABNORMAL LOW (ref 26.0–34.0)
MCHC: 31.3 g/dL (ref 30.0–36.0)
MCV: 64.5 fL — ABNORMAL LOW (ref 80.0–100.0)
Platelets: 232 10*3/uL (ref 150–400)
RBC: 5.35 MIL/uL — ABNORMAL HIGH (ref 3.87–5.11)
RDW: 16.5 % — ABNORMAL HIGH (ref 11.5–15.5)
WBC: 19.3 10*3/uL — ABNORMAL HIGH (ref 4.0–10.5)
nRBC: 0 % (ref 0.0–0.2)

## 2022-02-14 LAB — COMPREHENSIVE METABOLIC PANEL
ALT: 16 U/L (ref 0–44)
AST: 20 U/L (ref 15–41)
Albumin: 3.5 g/dL (ref 3.5–5.0)
Alkaline Phosphatase: 66 U/L (ref 38–126)
Anion gap: 6 (ref 5–15)
BUN: 15 mg/dL (ref 8–23)
CO2: 26 mmol/L (ref 22–32)
Calcium: 8.4 mg/dL — ABNORMAL LOW (ref 8.9–10.3)
Chloride: 104 mmol/L (ref 98–111)
Creatinine, Ser: 0.76 mg/dL (ref 0.44–1.00)
GFR, Estimated: >60 mL/min (ref >60)
Glucose, Bld: 107 mg/dL — ABNORMAL HIGH (ref 70–99)
Potassium: 3.9 mmol/L (ref 3.5–5.1)
Sodium: 136 mmol/L (ref 135–145)
Total Bilirubin: 1.3 mg/dL — ABNORMAL HIGH (ref 0.3–1.2)
Total Protein: 6.3 g/dL — ABNORMAL LOW (ref 6.5–8.1)

## 2022-02-14 LAB — MRSA NEXT GEN BY PCR, NASAL: MRSA by PCR Next Gen: NOT DETECTED

## 2022-02-14 LAB — TYPE AND SCREEN
ABO/RH(D): O POS
Antibody Screen: NEGATIVE

## 2022-02-14 LAB — LACTIC ACID, PLASMA: Lactic Acid, Venous: 1.6 mmol/L (ref 0.5–1.9)

## 2022-02-14 MED ORDER — MORPHINE SULFATE (PF) 2 MG/ML IV SOLN
2.0000 mg | INTRAVENOUS | Status: DC | PRN
  Administered 2022-02-15 – 2022-02-21 (×8): 2 mg via INTRAVENOUS
  Filled 2022-02-14 (×8): qty 1

## 2022-02-14 MED ORDER — HEPARIN SODIUM (PORCINE) 5000 UNIT/ML IJ SOLN
5000.0000 [IU] | Freq: Three times a day (TID) | INTRAMUSCULAR | Status: DC
  Administered 2022-02-14: 5000 [IU] via SUBCUTANEOUS
  Filled 2022-02-14: qty 1

## 2022-02-14 MED ORDER — DULOXETINE HCL 30 MG PO CPEP
60.0000 mg | ORAL_CAPSULE | Freq: Every day | ORAL | Status: DC
  Administered 2022-02-14 – 2022-02-21 (×8): 60 mg via ORAL
  Filled 2022-02-14 (×8): qty 2

## 2022-02-14 MED ORDER — PANTOPRAZOLE SODIUM 40 MG PO TBEC
40.0000 mg | DELAYED_RELEASE_TABLET | Freq: Every day | ORAL | Status: DC
  Administered 2022-02-14 – 2022-02-21 (×8): 40 mg via ORAL
  Filled 2022-02-14 (×8): qty 1

## 2022-02-14 MED ORDER — LORATADINE 10 MG PO TABS
10.0000 mg | ORAL_TABLET | Freq: Every day | ORAL | Status: DC
  Administered 2022-02-14 – 2022-02-21 (×8): 10 mg via ORAL
  Filled 2022-02-14 (×8): qty 1

## 2022-02-14 MED ORDER — AMLODIPINE BESYLATE 10 MG PO TABS
10.0000 mg | ORAL_TABLET | Freq: Every day | ORAL | Status: DC
Start: 2022-02-14 — End: 2022-02-14

## 2022-02-14 MED ORDER — HYDRALAZINE HCL 20 MG/ML IJ SOLN
10.0000 mg | Freq: Four times a day (QID) | INTRAMUSCULAR | Status: DC | PRN
Start: 2022-02-14 — End: 2022-02-14

## 2022-02-14 MED ORDER — ALBUTEROL SULFATE (2.5 MG/3ML) 0.083% IN NEBU
3.0000 mL | INHALATION_SOLUTION | Freq: Four times a day (QID) | RESPIRATORY_TRACT | Status: DC | PRN
  Administered 2022-02-20: 3 mL via RESPIRATORY_TRACT
  Filled 2022-02-14: qty 3

## 2022-02-14 MED ORDER — SODIUM CHLORIDE 0.9% FLUSH
3.0000 mL | Freq: Two times a day (BID) | INTRAVENOUS | Status: DC
  Administered 2022-02-14 – 2022-02-21 (×14): 3 mL via INTRAVENOUS

## 2022-02-14 MED ORDER — IOHEXOL 350 MG/ML SOLN
50.0000 mL | Freq: Once | INTRAVENOUS | Status: AC | PRN
  Administered 2022-02-14: 50 mL via INTRAVENOUS

## 2022-02-14 MED ORDER — LEVOTHYROXINE SODIUM 50 MCG PO TABS
75.0000 ug | ORAL_TABLET | Freq: Every day | ORAL | Status: DC
  Administered 2022-02-15 – 2022-02-21 (×7): 75 ug via ORAL
  Filled 2022-02-14 (×7): qty 2

## 2022-02-14 MED ORDER — HYDRALAZINE HCL 20 MG/ML IJ SOLN: 5.0000 mg | Freq: Four times a day (QID) | INTRAMUSCULAR | Status: AC

## 2022-02-14 MED ORDER — SODIUM CHLORIDE 0.9 % IV SOLN: 20.0000 mL/h | INTRAVENOUS | Status: DC

## 2022-02-14 MED ORDER — ORAL CARE MOUTH RINSE
15.0000 mL | Freq: Two times a day (BID) | OROMUCOSAL | Status: DC
  Administered 2022-02-14 – 2022-02-21 (×14): 15 mL via OROMUCOSAL

## 2022-02-14 MED ORDER — LACTATED RINGERS IV SOLN: INTRAVENOUS | Status: AC

## 2022-02-14 MED ORDER — HYDRALAZINE HCL 20 MG/ML IJ SOLN: 5.0000 mg | Freq: Four times a day (QID) | INTRAMUSCULAR | Status: DC | PRN

## 2022-02-14 MED ORDER — DARIFENACIN HYDROBROMIDE ER 7.5 MG PO TB24
7.5000 mg | ORAL_TABLET | Freq: Every day | ORAL | Status: DC
Start: 2022-02-14 — End: 2022-02-21
  Administered 2022-02-14 – 2022-02-21 (×8): 7.5 mg via ORAL
  Filled 2022-02-14 (×8): qty 1

## 2022-02-14 MED ORDER — METOPROLOL SUCCINATE ER 100 MG PO TB24
200.0000 mg | ORAL_TABLET | Freq: Every day | ORAL | Status: DC
  Administered 2022-02-14 – 2022-02-21 (×8): 200 mg via ORAL
  Filled 2022-02-14 (×8): qty 2

## 2022-02-14 MED ORDER — PANTOPRAZOLE SODIUM 40 MG IV SOLR
40.0000 mg | Freq: Two times a day (BID) | INTRAVENOUS | Status: DC
  Administered 2022-02-14: 40 mg via INTRAVENOUS
  Filled 2022-02-14: qty 10

## 2022-02-14 MED ORDER — METRONIDAZOLE 500 MG/100ML IV SOLN
500.0000 mg | Freq: Two times a day (BID) | INTRAVENOUS | Status: DC
  Administered 2022-02-14 – 2022-02-21 (×15): 500 mg via INTRAVENOUS
  Filled 2022-02-14 (×15): qty 100

## 2022-02-14 NOTE — Assessment & Plan Note (Addendum)
.    Hemoglobin seems stable. ?-Continue to monitor ?

## 2022-02-14 NOTE — Assessment & Plan Note (Addendum)
Appears euvolemic  

## 2022-02-14 NOTE — Assessment & Plan Note (Addendum)
Patient continued to have abdominal pain.  Repeat CT abdomen with stable rather slightly improved hemoperitoneum but with the new development of abscess. ?IR was consulted by general surgery for percutaneous drain placement which will be done tomorrow. ?-Continue cefepime and Flagyl ?-N.p.o. after midnight for percutaneous drain placement by IR ?-Continue with clear liquid ?-Continue to monitor as if she deteriorates she will need urgent exploratory laparotomy ?

## 2022-02-14 NOTE — Consult Note (Signed)
[facility] SURGICAL CONSULTATION NOTE (initial) - cpt: 64403   HISTORY OF PRESENT ILLNESS (HPI):  88 y.o. female presented to [facility] ED yesterday for evaluation of LLQ abdominal pain. Patient reportedly with history of LLQ abdominal pain for around the last 3 days or so. Some associated diarrhea in the last few days as well. No fever, chills, nausea, emesis, or urinary changes. Also with chronic right sided back pain with radiculopathy. No history of diverticulitis in the past. Abdominal surgical history includes appendectomy, abdominal hysterectomy, bladder repair. Work up in the ED revealed a leukocytosis to 22.4K (now 19.3K), normal renal function sCr - 0.66, mild lactic acidosis to 2.1 (now 1.6). CT Abdomen/Pelvis was obtained and concerning for acute diverticulitis with perforation.   Surgery is consulted by emergency medicine provider Ronnie Doss, PA-C in this context for evaluation and management of perforated diverticulitis.  PAST MEDICAL HISTORY (PMH):  Past Medical History:  Diagnosis Date   Anemia    hx Thalassemia minor anemia   Asthma    Cancer (HCC)    melanoma on back   Chronic back pain    ESI/uses BC powders   Depression    GERD (gastroesophageal reflux disease)    Headache(784.0)    Hypertension    Hypothyroidism    Peripheral vascular disease (HCC)    legs   Sepsis (HCC) 01/27/2014   Shortness of breath    Tendinitis of both rotator cuffs    UTI (urinary tract infection)      PAST SURGICAL HISTORY (PSH):  Past Surgical History:  Procedure Laterality Date   ABDOMINAL HYSTERECTOMY     vagina   ANTERIOR CERVICAL DECOMP/DISCECTOMY FUSION N/A 05/01/2013   Procedure: ANTERIOR CERVICAL DECOMPRESSION/DISCECTOMY FUSION 3 LEVELS;  Surgeon: Mariam Dollar, MD;  Location: [facility];  Service: Neurosurgery;  Laterality: N/A;  ANTERIOR CERVICAL DECOMPRESSION/DISCECTOMY FUSION 3 LEVELS   APPENDECTOMY     BLADDER REPAIR     after hysterectomy same day    EYE SURGERY Bilateral    cataracts   TONSILLECTOMY     TUBAL LIGATION       MEDICATIONS:  Prior to Admission medications   Medication Sig Start Date End Date Taking? Authorizing Provider  amLODipine (NORVASC) 5 MG tablet Take 2 tablets (10 mg total) by mouth daily. 02/04/14  Yes Rhetta Mura, MD  Aspirin-Caffeine 1000-65 MG PACK Take 1 packet by mouth 2 (two) times daily.   Yes [provider]  Cholecalciferol 10 MCG (400 UNIT) CAPS Take by mouth. Take by mouth daily.   Yes [provider]  DULoxetine (CYMBALTA) 60 MG capsule  05/14/18  Yes [provider]  fexofenadine (ALLEGRA) 180 MG tablet Take 180 mg by mouth daily.   Yes [provider]  levothyroxine (SYNTHROID, LEVOTHROID) 75 MCG tablet Take 75 mcg by mouth daily before breakfast.   Yes [provider]  metoprolol (TOPROL-XL) 200 MG 24 hr tablet Take 200 mg by mouth daily.   Yes [provider]  Multiple Vitamin (MULTIVITAMIN WITH MINERALS) TABS Take 1 tablet by mouth daily.   Yes [provider]  omeprazole (PRILOSEC) 20 MG capsule Take 20 mg by mouth daily.   Yes [provider]  VITAMIN E PO Take 500 Units by mouth daily.   Yes [provider]  albuterol (PROVENTIL HFA;VENTOLIN HFA) 108 (90 BASE) MCG/ACT inhaler Inhale 2 puffs into the lungs every 6 (six) hours as needed for wheezing.    [provider]  fosfomycin (MONUROL) 3  g PACK Take by mouth. Patient not taking: Reported on 02/14/2022 01/15/21   [provider]  furosemide (LASIX) 20 MG tablet Take 1 tablet (20 mg total) by mouth daily. Patient not taking: Reported on 02/14/2022 02/08/14   Zannie Cove, MD  guaifenesin (ROBITUSSIN) 100 MG/5ML syrup Take 200 mg by mouth 3 (three) times daily as needed for cough. Patient not taking: Reported on 02/14/2022    [provider]  loperamide (IMODIUM) 2 MG capsule Take 1 capsule (2 mg total) by mouth 2 (two) times daily as  needed for diarrhea or loose stools. Patient not taking: Reported on 02/14/2022 02/08/14   Zannie Cove, MD  LORazepam (ATIVAN) 0.5 MG tablet Take by mouth. 06/20/18   [provider]  magnesium oxide (MAG-OX) 400 MG tablet Take 400 mg by mouth daily.    [provider]  nitrofurantoin, macrocrystal-monohydrate, (MACROBID) 100 MG capsule Take 1 capsule (100 mg total) by mouth every 12 (twelve) hours. Patient not taking: Reported on 02/14/2022 02/01/21   Alfredo Martinez, MD  potassium chloride SA (K-DUR,KLOR-CON) 20 MEQ tablet Take 2 tablets (40 mEq total) by mouth 2 (two) times daily with a meal. Patient not taking: Reported on 02/14/2022 02/04/14   Rhetta Mura, MD  solifenacin (VESICARE) 5 MG tablet Take 5 mg by mouth daily. Patient not taking: Reported on 02/14/2022    [provider]  vitamin C (ASCORBIC ACID) 500 MG tablet Take 500 mg by mouth daily. Patient not taking: Reported on 02/14/2022    [provider]  Vitamins/Minerals TABS Take by mouth. Patient not taking: Reported on 02/14/2022    [provider]     ALLERGIES:  Allergies  Allergen Reactions   Ciprofloxacin Swelling and Rash   Hydrocodone     hallucinations   Oxycodone     hallucinations   Zoloft [Sertraline Hcl]     Burns stomach   Sertraline Other (See Comments)    "Burns stomach"   Sulfa Antibiotics     Blisters in mouth   Tylenol [Acetaminophen]     headaches   Ultram [Tramadol]     headaches     SOCIAL HISTORY:  Social History   Socioeconomic History   Marital status: Widowed    Spouse name: Not on file   Number of children: Not on file   Years of education: Not on file   Highest education level: Not on file  Occupational History   Not on file  Tobacco Use   Smoking status: Former    Packs/day: 1.00    Years: 30.00    Pack years: 30.00    Types: Cigarettes    Quit date: 04/30/1995    Years since quitting: 26.8   Smokeless tobacco: Never  Substance  and Sexual Activity   Alcohol use: No   Drug use: No   Sexual activity: Not Currently  Other Topics Concern   Not on file  Social History Narrative   Not on file   Social Determinants of Health   Financial Resource Strain: Not on file  Food Insecurity: Not on file  Transportation Needs: Not on file  Physical Activity: Not on file  Stress: Not on file  Social Connections: Not on file  Intimate Partner Violence: Not on file     FAMILY HISTORY:  Family History  Problem Relation Age of Onset   Thalassemia Son    Thalassemia Paternal Aunt       REVIEW OF SYSTEMS:  Review of Systems  Constitutional:  Negative for chills and fever.  HENT:  Negative for congestion and sore throat.   Respiratory:  Negative for cough and shortness of breath.   Cardiovascular:  Negative for chest pain.  Gastrointestinal:  Positive for abdominal pain and diarrhea. Negative for nausea and vomiting.  Genitourinary:  Negative for dysuria and urgency.  Musculoskeletal:  Positive for back pain. Negative for myalgias.  All other systems reviewed and are negative.  VITAL SIGNS:  Temp:  [98 F (36.7 C)-98.8 F (37.1 C)] 98 F (36.7 C) (05/01 0332) Pulse Rate:  [89-95] 89 (05/01 0332) Resp:  [16-18] 18 (05/01 0332) BP: (124-139)/(47-80) 124/53 (05/01 0332) SpO2:  [89 %-99 %] 95 % (05/01 0332) Weight:  [73 kg-78.3 kg] 78.3 kg (05/01 0332)     Height: 5\' 3"  (160 cm) Weight: 78.3 kg BMI (Calculated): 30.59   INTAKE/OUTPUT:  04/30 0701 - 05/01 0700 In: 1214.1 [I.V.:13.3; IV Piggyback:1200.8] Out: 0   PHYSICAL EXAM:  Physical Exam Vitals and nursing note reviewed. Exam conducted with a chaperone present.  Constitutional:      General: She is not in acute distress.    Appearance: Normal appearance. She is not ill-appearing.  HENT:     Head: Normocephalic and atraumatic.  Eyes:     General: No scleral icterus.    Conjunctiva/sclera: Conjunctivae normal.  Cardiovascular:     Rate and Rhythm:  Normal rate.     Pulses: Normal pulses.     Heart sounds: No murmur heard. Pulmonary:     Effort: Pulmonary effort is normal. No respiratory distress.     Breath sounds: Normal breath sounds.  Abdominal:     General: Abdomen is flat. A surgical scar is present.     Palpations: Abdomen is soft.     Tenderness: There is abdominal tenderness in the left lower quadrant. There is no guarding or rebound.     Comments: Abdomen is soft, she is focally tender in the LLQ, non-distended, no rebound/guarding. No overt evidence of peritonitis. Previous surgical scars seen   Genitourinary:    Comments: Deferred Musculoskeletal:     Right lower leg: No edema.     Left lower leg: No edema.  Skin:    General: Skin is warm and dry.     Coloration: Skin is not pale.     Findings: No erythema.  Neurological:     General: No focal deficit present.     Mental Status: She is alert and oriented to person, place, and time.  Psychiatric:        Mood and Affect: Mood normal.        Behavior: Behavior normal.     Labs:     Latest Ref Rng & Units 02/14/2022    5:23 AM 02/13/2022    8:45 PM 02/08/2014    5:00 AM  CBC  WBC 4.0 - 10.5 K/uL 19.3   22.4   12.6    Hemoglobin 12.0 - 15.0 g/dL 16.1   09.6   9.1    Hematocrit 36.0 - 46.0 % 34.5   39.3   27.8    Platelets 150 - 400 K/uL 232   279   382        Latest Ref Rng & Units 02/14/2022    5:23 AM 02/13/2022    8:45 PM 01/01/2018    3:34 PM  CMP  Glucose 70 - 99 mg/dL 045   409   91    BUN 8 - 23 mg/dL 15  14   19    Creatinine 0.44 - 1.00 mg/dL 1.61   0.96   0.45    Sodium 135 - 145 mmol/L 136   137   139    Potassium 3.5 - 5.1 mmol/L 3.9   4.1   4.2    Chloride 98 - 111 mmol/L 104   101   96    CO2 22 - 32 mmol/L 26   25     Calcium 8.9 - 10.3 mg/dL 8.4   9.2   9.3    Total Protein 6.5 - 8.1 g/dL 6.3   7.1   7.0    Total Bilirubin 0.3 - 1.2 mg/dL 1.3   1.2   0.2    Alkaline Phos 38 - 126 U/L 66   79   91    AST 15 - 41 U/L 20   29   23     ALT 0  - 44 U/L 16   18        Imaging studies:   CT Abdomen/Pelvis (02/13/2022) personally reviewed which shows inflammation surrounding the descending/sigmoid colon with pneumoperitoneum seen concerning for perforated diverticulitis, and radiologist report reviewed below:  IMPRESSION: Changes consistent with acute diverticulitis with perforation and considerable free intraperitoneal air. This corresponds with that seen on recent chest x-ray.  Cholelithiasis without complicating factors.  Hiatal hernia.    Assessment/Plan: (ICD-10's: K81.20) 88 y.o. female with abdominal pain and leukocytosis found to have perforated diverticulitis without abscess or peritonitis   - Appreciate medicine admission - No emergent surgical interventions. Will attempt conservative measures as outlined below. She understands that should she fail to improve or clinically deteriorate, we would proceed with urgent intervention and likely temporizing colostomy.    - NPO + IVF resuscitation  - IV Abx ([place]) - Monitor abdominal examination - Pain control prn; antiemetics prn - May need repeat CT in 48-72 hours pending clinical condition    - Further management per primary service; we will follow   All of the above findings and recommendations were discussed with the patient, and all of patient's questions were answered to her expressed satisfaction.  Thank you for the opportunity to participate in this patient's care.   -- Lynden Oxford, PA-C [facility] 02/14/2022, 7:38 AM M-F: 7am - 4pm

## 2022-02-14 NOTE — Assessment & Plan Note (Addendum)
Met sepsis criteria with leukocytosis, tachycardia and secondary to start perforation with diverticulitis.  Repeat imaging with concern of abscess formation. ?-Percutaneous drain placement by IR tomorrow ?-Continue antibiotics as mentioned above ?

## 2022-02-14 NOTE — Assessment & Plan Note (Addendum)
Blood pressure within goal.. ?On amlodipine and metoprolol at home. ?-Continue metoprolol and amlodipine ?

## 2022-02-14 NOTE — Assessment & Plan Note (Addendum)
Continue home dose of Synthroid. ?

## 2022-02-14 NOTE — Progress Notes (Signed)
Mobility Specialist - Progress Note ? ? 02/14/22 1400  ?Mobility  ?Activity Ambulated with assistance in room;Ambulated with assistance to bathroom  ?Level of Assistance Standby assist, set-up cues, supervision of patient - no hands on  ?Assistive Device Front wheel walker  ?Activity Response Tolerated well  ?$Mobility charge 1 Mobility  ? ? ? ?During mobility: 90 HR, 90% SpO2 ? ? ?Pt lying in bed upon arrival, utilizing 1L. O2 maintained low 90s throughout session. Pt exited bed with minA via log-roll technique. Mild dizziness once upright. Pt voiced feeling weak during ambulation, but was able to tolerate ambulation for ~30' with no LOB. Assist to keep RW within BOS. Pt ambulated to bathroom for BM, but unsuccessful. Pt returned to bed with alarm set, needs in reach. Daughter at bedside.  ? ? ?Laura Frey ?Mobility Specialist ?02/14/22, 2:25 PM ? ? ?

## 2022-02-14 NOTE — Progress Notes (Addendum)
?PROGRESS NOTE ? ? ? ?Laura Frey  LZJ:[id number] DOB: 05/12/1932 DOA: 02/13/2022 ?PCP: Marinda Elk, MD  ? ? ? ? ?Brief Narrative:  ? ?Admitted with LLQ abdominal pain, found to have acute perforated diverticulitis. ? ? ?Assessment & Plan: ?  ?Principal Problem: ?  Abdominal pain ?Active Problems: ?  Chronic low back pain (1ry area of Pain) (Bilateral) (R>L) w/ sciatica (Bilateral) ?  Perforated diverticulum of intestine ?  Severe sepsis ([place]) ?  Anemia ?  Acute respiratory failure with hypoxia ([place]) ?  COPD (chronic obstructive pulmonary disease) ([place]) ?  Hypertension ?  Hypothyroid ?  Chronic diastolic CHF (congestive heart failure) ([place]) ?  Acute diverticulitis ? ?# Acute sigmoid diverticulitis with perforation ?Hemodynamically stable. Gen surg following ?- maintain NPO, sips w/ meds ?- no plan for surgery today per gen surg ?- continue cefepime/flagyl ?- monitor for decompensation ?- morphine for pain control ?- PT/OT when we get go-ahead from gen surg ? ?# Acute hypoxic respiratory failure ?# COPD ?Likely 2/2 atelectasis, baseline COPD. CTA neg for PE, no concerning pulmonary findings. No wheeze or productive cough or sig sob to suggest copd exacerbation ?- incentive spirometry, continue [place] O2 ?- albuterol prn ? ?# HTN ?Here bp wnl  ?- hold amlodipine for now ?- cont home metop ? ?# Hypothyroid ?- cont home synthroid ? ?# Chronic pain ?- cont home duloxetine ? ? ? ?DVT prophylaxis: SCDs (per gen surg) ?Code Status: full ?Family Communication: son updated telephonically 5/1 ? ?Level of care: Med-Surg ?Status is: Inpatient ?Remains inpatient appropriate because: severity of illness ? ? ? ?Consultants:  ?Gen surg ? ?Procedures: ?none ? ?Antimicrobials:  ?Cefepime/flagyl 4/30>  ? ? ?Subjective: ?Ongoing llq pain and nausea, no vomiting. Stools loose.  ? ?Objective: ?Vitals:  ? 02/14/22 0130 02/14/22 0154 02/14/22 0240 02/14/22 9735  ?BP: 125/67  (!) 124/53 (!) 128/53  ?Pulse: 89  89 93  ?Resp: '18  18 16   '$ ?Temp:  98.8 ?F (37.1 ?C) 98 ?F (36.7 ?C) 98.1 ?F (36.7 ?C)  ?TempSrc:  Oral Oral Oral  ?SpO2: 98%  95% 97%  ?Weight:   78.3 kg   ?Height:   '5\' 3"'$  (1.6 m)   ? ? ?Intake/Output Summary (Last 24 hours) at 02/14/2022 0927 ?Last data filed at 02/14/2022 0418 ?Gross per 24 hour  ?Intake 1214.11 ml  ?Output 0 ml  ?Net 1214.11 ml  ? ?Filed Weights  ? 02/13/22 1826 02/14/22 0332  ?Weight: 73 kg 78.3 kg  ? ? ?Examination: ? ?General exam: Appears calm and comfortable  ?Respiratory system: Clear to auscultation. Respiratory effort normal. ?Cardiovascular system: S1 & S2 heard, RRR. No JVD, murmurs, rubs, gallops or clicks. No pedal edema. ?Gastrointestinal system: Abdomen is nondistended, soft and nontender. No organomegaly or masses felt. Normal bowel sounds heard. ?Central nervous system: Alert and oriented. No focal neurological deficits. ?Extremities: Symmetric 5 x 5 power. ?Skin: No rashes, lesions or ulcers ?Psychiatry: Judgement and insight appear normal. Mood & affect appropriate.  ? ? ? ?Data Reviewed: I have personally reviewed following labs and imaging studies ? ?CBC: ?Recent Labs  ?Lab 02/13/22 ?2045 02/14/22 ?3299  ?WBC 22.4* 19.3*  ?NEUTROABS 19.4*  --   ?HGB 12.4 10.8*  ?HCT 39.3 34.5*  ?MCV 64.5* 64.5*  ?PLT 279 232  ? ?Basic Metabolic Panel: ?Recent Labs  ?Lab 02/13/22 ?2045 02/14/22 ?2426  ?NA 137 136  ?K 4.1 3.9  ?CL 101 104  ?CO2 25 26  ?GLUCOSE 125* 107*  ?BUN 14  15  ?CREATININE 0.66 0.76  ?CALCIUM 9.2 8.4*  ? ?GFR: ?Estimated Creatinine Clearance: 49.1 mL/min (by C-G formula based on SCr of 0.76 mg/dL). ?Liver Function Tests: ?Recent Labs  ?Lab 02/13/22 ?2045 02/14/22 ?9371  ?AST 29 20  ?ALT 18 16  ?ALKPHOS 79 66  ?BILITOT 1.2 1.3*  ?PROT 7.1 6.3*  ?ALBUMIN 3.9 3.5  ? ?No results for input(s): LIPASE, AMYLASE in the last 168 hours. ?No results for input(s): AMMONIA in the last 168 hours. ?Coagulation Profile: ?No results for input(s): INR, PROTIME in the last 168 hours. ?Cardiac Enzymes: ?No results for  input(s): CKTOTAL, CKMB, CKMBINDEX, TROPONINI in the last 168 hours. ?BNP (last 3 results) ?No results for input(s): PROBNP in the last 8760 hours. ?HbA1C: ?No results for input(s): HGBA1C in the last 72 hours. ?CBG: ?No results for input(s): GLUCAP in the last 168 hours. ?Lipid Profile: ?No results for input(s): CHOL, HDL, LDLCALC, TRIG, CHOLHDL, LDLDIRECT in the last 72 hours. ?Thyroid Function Tests: ?No results for input(s): TSH, T4TOTAL, FREET4, T3FREE, THYROIDAB in the last 72 hours. ?Anemia Panel: ?No results for input(s): VITAMINB12, FOLATE, FERRITIN, TIBC, IRON, RETICCTPCT in the last 72 hours. ?Urine analysis: ?   ?Component Value Date/Time  ? [place] YELLOW 01/27/2014 2253  ? APPEARANCEUR Cloudy (A) 01/25/2021 1013  ? LABSPEC 1.005 01/27/2014 2253  ? LABSPEC 1.025 01/27/2014 1557  ? PHURINE 5.0 01/27/2014 2253  ? GLUCOSEU Negative 01/25/2021 1013  ? GLUCOSEU NEGATIVE 01/27/2014 1557  ? [place] NEGATIVE 01/27/2014 2253  ? BILIRUBINUR Negative 01/25/2021 1013  ? [place] NEGATIVE 01/27/2014 1557  ? [place] NEGATIVE 01/27/2014 2253  ? PROTEINUR Negative 01/25/2021 1013  ? PROTEINUR NEGATIVE 01/27/2014 2253  ? UROBILINOGEN 0.2 01/27/2014 2253  ? NITRITE Positive (A) 01/25/2021 1013  ? NITRITE NEGATIVE 01/27/2014 2253  ? LEUKOCYTESUR 1+ (A) 01/25/2021 1013  ? LEUKOCYTESUR NEGATIVE 01/27/2014 1557  ? ?Sepsis Labs: ?'@LABRCNTIP'$ (procalcitonin:4,lacticidven:4) ? ?) ?Recent Results (from the past 240 hour(s))  ?Resp Panel by RT-PCR (Flu A&B, Covid) Nasopharyngeal Swab     Status: None  ? Collection Time: 02/13/22  8:45 PM  ? Specimen: Nasopharyngeal Swab; Nasopharyngeal(NP) swabs in vial transport medium  ?Result Value Ref Range Status  ? SARS Coronavirus 2 by RT PCR NEGATIVE NEGATIVE Final  ?  Comment: (NOTE) ?SARS-CoV-2 target nucleic acids are NOT DETECTED. ? ?The SARS-CoV-2 RNA is generally detectable in upper respiratory ?specimens during the acute phase of infection. The lowest ?concentration of  SARS-CoV-2 viral copies this assay can detect is ?138 copies/mL. A negative result does not preclude SARS-Cov-2 ?infection and should not be used as the sole basis for treatment or ?other patient management decisions. A negative result may occur with  ?improper specimen collection/handling, submission of specimen other ?than nasopharyngeal swab, presence of viral mutation(s) within the ?areas targeted by this assay, and inadequate number of viral ?copies(<138 copies/mL). A negative result must be combined with ?clinical observations, patient history, and epidemiological ?information. The expected result is Negative. ? ?Fact Sheet for Patients:  ?EntrepreneurPulse.com.au ? ?Fact Sheet for Healthcare Providers:  ?IncredibleEmployment.be ? ?This test is no t yet approved or cleared by the [place] FDA and  ?has been authorized for detection and/or diagnosis of SARS-CoV-2 by ?FDA under an Emergency Use Authorization (EUA). This EUA will remain  ?in effect (meaning this test can be used) for the duration of the ?COVID-19 declaration under Section 564(b)(1) of the Act, 21 ?U.S.C.section 360bbb-3(b)(1), unless the authorization is terminated  ?or revoked sooner.  ? ? ?  ?  Influenza A by PCR NEGATIVE NEGATIVE Final  ? Influenza B by PCR NEGATIVE NEGATIVE Final  ?  Comment: (NOTE) ?The Xpert Xpress SARS-CoV-2/FLU/RSV plus assay is intended as an aid ?in the diagnosis of influenza from Nasopharyngeal swab specimens and ?should not be used as a sole basis for treatment. Nasal washings and ?aspirates are unacceptable for Xpert Xpress SARS-CoV-2/FLU/RSV ?testing. ? ?Fact Sheet for Patients: ?EntrepreneurPulse.com.au ? ?Fact Sheet for Healthcare Providers: ?IncredibleEmployment.be ? ?This test is not yet approved or cleared by the [place] FDA and ?has been authorized for detection and/or diagnosis of SARS-CoV-2 by ?FDA under an Emergency Use  Authorization (EUA). This EUA will remain ?in effect (meaning this test can be used) for the duration of the ?COVID-19 declaration under Section 564(b)(1) of the Act, 21 U.S.C. ?section 360bbb-3(b)(1), unless the auth

## 2022-02-14 NOTE — Assessment & Plan Note (Addendum)
No concern of exacerbation. ?-Continue with home bronchodilators ?

## 2022-02-14 NOTE — Assessment & Plan Note (Addendum)
Improved.  Most likely secondary to sepsis.  CTA was negative for PE and lower venous Doppler was negative for DVT. ?-Continue with supportive care ?-Continue with supplemental oxygen as needed ? ?

## 2022-02-14 NOTE — Assessment & Plan Note (Addendum)
Secondary to diverticulitis.  Significant diffuse tenderness with mild distention. ?Surgery is on board. ?-Continue with supportive care ?

## 2022-02-15 DIAGNOSIS — R1032 Left lower quadrant pain: Secondary | ICD-10-CM | POA: Diagnosis not present

## 2022-02-15 DIAGNOSIS — K5720 Diverticulitis of large intestine with perforation and abscess without bleeding: Secondary | ICD-10-CM | POA: Diagnosis not present

## 2022-02-15 LAB — CBC
HCT: 32.1 % — ABNORMAL LOW (ref 36.0–46.0)
Hemoglobin: 10.2 g/dL — ABNORMAL LOW (ref 12.0–15.0)
MCH: 20.4 pg — ABNORMAL LOW (ref 26.0–34.0)
MCHC: 31.8 g/dL (ref 30.0–36.0)
MCV: 64.2 fL — ABNORMAL LOW (ref 80.0–100.0)
Platelets: 178 10*3/uL (ref 150–400)
RBC: 5 MIL/uL (ref 3.87–5.11)
RDW: 15.9 % — ABNORMAL HIGH (ref 11.5–15.5)
WBC: 13.8 10*3/uL — ABNORMAL HIGH (ref 4.0–10.5)
nRBC: 0 % (ref 0.0–0.2)

## 2022-02-15 LAB — BASIC METABOLIC PANEL
Anion gap: 6 (ref 5–15)
BUN: 12 mg/dL (ref 8–23)
CO2: 28 mmol/L (ref 22–32)
Calcium: 8.6 mg/dL — ABNORMAL LOW (ref 8.9–10.3)
Chloride: 100 mmol/L (ref 98–111)
Creatinine, Ser: 0.68 mg/dL (ref 0.44–1.00)
GFR, Estimated: >60 mL/min (ref >60)
Glucose, Bld: 90 mg/dL (ref 70–99)
Potassium: 3.7 mmol/L (ref 3.5–5.1)
Sodium: 134 mmol/L — ABNORMAL LOW (ref 135–145)

## 2022-02-15 LAB — LACTIC ACID, PLASMA: Lactic Acid, Venous: 0.7 mmol/L (ref 0.5–1.9)

## 2022-02-15 MED ORDER — DEXTROSE-NACL 5-0.9 % IV SOLN
INTRAVENOUS | Status: DC
Start: 2022-02-15 — End: 2022-02-21

## 2022-02-15 MED ORDER — MENTHOL 3 MG MT LOZG
1.0000 | LOZENGE | OROMUCOSAL | Status: DC | PRN
  Administered 2022-02-15 – 2022-02-19 (×2): 3 mg via ORAL
  Filled 2022-02-15 (×2): qty 9

## 2022-02-15 MED ORDER — ENOXAPARIN SODIUM 40 MG/0.4ML IJ SOSY
40.0000 mg | PREFILLED_SYRINGE | INTRAMUSCULAR | Status: DC
  Administered 2022-02-15: 40 mg via SUBCUTANEOUS
  Filled 2022-02-15: qty 0.4

## 2022-02-15 NOTE — Evaluation (Signed)
Occupational Therapy Evaluation ?Patient Details ?Name: Laura Frey ?MRN: [id number] ?DOB: [birth year] ?Today's Date: 02/15/2022 ? ? ?History of Present Illness 88 y.o. female who was admitted to [facility] with Abdominal pain, perforated diverticulum of large intestine, Acute respiratory failure, Sepsis with ARF.  ? ?Clinical Impression ?  ?Pt. Presents with abdominal pain, weakness, limited activity tolerance, and limited functional mobility which limits her ability to complete basic ADL and IADL functioning. Pt. resides at home with her son. Pt.'s son assists with all ADLs, and IADLs, meal preparation, and medication management skills. Pt. Mostly takes sponge baths., and her son assists with her hair care. Pt. Spends most of the time moving  between the bed, and the chair when at home. Pt. reports using a bell to alert her son when she needs something. Pt.'s daughter reports that when she stays at her house she will walk using the rollator, and initiate fixing a light snack from the kitchen. Pt. Requires minA UE ADLs, and MaxA LE ADLs. See below for mobility. Pt. Presents with posterior leaning in standing. Pt. requires, and responds well to increased time, and cues. Pt. education was provided to the pt., and her daughter about A/E use for LE ADLs. Pt. will benefit from OT services for ADL training, A/E training, and pt. education about home modification, and DME. Pt. Plans to return home upon discharge with family to assist pt. as needed. Pt. Reports that her son is currently not working, and will be able to resume assisting with daily care. Pt. Could benefit from follow-up [place] services upon discharge.  ? ? ?   ? ?Recommendations for follow up therapy are one component of a multi-disciplinary discharge planning process, led by the attending physician.  Recommendations may be updated based on patient status, additional functional criteria and insurance authorization.  ? ?Follow Up Recommendations ? Home health OT ?   ?Assistance Recommended at Discharge    ?Patient can return home with the following A lot of help with bathing/dressing/bathroom;Assistance with cooking/housework;Direct supervision/assist for medications management;Help with stairs or ramp for entrance ? ?  ?Functional Status Assessment ?    ?Equipment Recommendations ? BSC/3in1  ?  ?Recommendations for Other Services   ? ? ?  ?Precautions / Restrictions Precautions ?Precautions: Fall ?Restrictions ?Weight Bearing Restrictions: No  ? ?  ? ?Mobility Bed Mobility ?Overal bed mobility: Needs Assistance ?Bed Mobility: Supine to Sit, Sit to Supine ?  ?  ?Supine to sit: Min assist ?Sit to supine: Mod assist ?  ?General bed mobility comments: Increased time, cues for encouragement were required. ?  ? ?Transfers ?Overall transfer level: Needs assistance ?  ?Transfers: Sit to/from Stand ?Sit to Stand: Mod assist ?  ?  ?  ?  ?  ?  ?  ? ?  ?Balance Overall balance assessment: Needs assistance ?  ?Sitting balance-Leahy Scale: Good ?  ?  ?  ?Standing balance-Leahy Scale: Poor ?Standing balance comment: Retropulsion. Pt. is able to right self to midline with cues, and increased time. ?  ?  ?  ?  ?  ?  ?  ?  ?  ?  ?  ?   ? ?ADL either performed or assessed with clinical judgement  ? ?ADL Overall ADL's : Needs assistance/impaired ?Eating/Feeding: Set up ?  ?Grooming: Minimal assistance;Bed level ?  ?  ?  ?Lower Body Bathing: Maximal assistance ?  ?Upper Body Dressing : Minimal assistance ?Upper Body Dressing Details (indicate cue type and reason): gown ?Lower Body Dressing:  Maximal assistance ?  ?  ?  ?  ?  ?  ?  ?Functional mobility during ADLs: Moderate assistance;Maximal assistance ?General ADL Comments: secondary to posterior leaning, and heavy UE reliance on the walker.  ? ? ? ?Vision Baseline Vision/History:  (Wears glasses) ?Patient Visual Report: No change from baseline ?   ?   ?Perception   ?  ?Praxis   ?  ? ?Pertinent Vitals/Pain Pain Assessment ?Pain Assessment:  Faces ?Faces Pain Scale: Hurts even more ?Pain Location: L sided abdonminal pain ?Pain Descriptors / Indicators: Discomfort ?Pain Intervention(s): Limited activity within patient's tolerance, Monitored during session, Repositioned  ? ? ? ?Hand Dominance Right ?  ?Extremity/Trunk Assessment Upper Extremity Assessment ?Upper Extremity Assessment: Generalized weakness (age appropriate, limitations) ?  ?Lower Extremity Assessment ?Lower Extremity Assessment: Generalized weakness ?  ?  ?  ?Communication Communication ?Communication: No difficulties ?  ?Cognition Arousal/Alertness: Awake/alert ?Behavior During Therapy: [facility] for tasks assessed/performed ?Overall Cognitive Status: Within Functional Limits for tasks assessed ?  ?  ?  ?  ?  ?  ?  ?  ?  ?  ?  ?  ?  ?  ?  ?  ?  ?  ?  ?General Comments    ? ?  ?Exercises   ?  ?Shoulder Instructions    ? ? ?Home Living Family/patient expects to be discharged to:: Private residence ?Living Arrangements: Children (son lives with pt, pt will stay at daughter's home occasionally) ?Available Help at Discharge: Family;Available 24 hours/day ?Type of Home: House ?Home Access: Ramped entrance ?  ?  ?Home Layout: One level ?  ?  ?Bathroom Shower/Tub: Tub/shower unit;Curtain (pt only rarely in the shower, generally does sponge baths) ?  ?  ?  ?  ?Home Equipment: Rollator (4 wheels);Grab bars - tub/shower;Hand held shower head;Shower seat ?  ?  ?  ? ?  ?Prior Functioning/Environment Prior Level of Function : Needs assist ?  ?  ?  ?  ?  ?  ?Mobility Comments: Uses a rollator for mobility ?ADLs Comments: Pt.'s son assists pt. with ADLs, and IADLs, meal preparation, medication managements, and pillbox set-up. Pt. does not drive. ?  ? ?  ?  ?OT Problem List: Decreased strength ?  ?   ?OT Treatment/Interventions: Self-care/ADL training;Therapeutic exercise;DME and/or AE instruction;Patient/family education;Therapeutic activities;Neuromuscular education  ?  ?OT Goals(Current goals can be found in  the care plan section) Acute Rehab OT Goals ?Patient Stated Goal: To return home ?OT Goal Formulation: With patient ?Time For Goal Achievement: 02/15/22 ?Potential to Achieve Goals: Good  ?OT Frequency: Min 2X/week ?  ? ?Co-evaluation   ?  ?  ?  ?  ? ?  ?AM-PAC OT "6 Clicks" Daily Activity     ?Outcome Measure Help from another person eating meals?: None ?Help from another person taking care of personal grooming?: A Little ?Help from another person toileting, which includes using toliet, bedpan, or urinal?: A Lot ?Help from another person bathing (including washing, rinsing, drying)?: A Lot ?Help from another person to put on and taking off regular upper body clothing?: A Little ?Help from another person to put on and taking off regular lower body clothing?: A Lot ?6 Click Score: 16 ?  ?End of Session Equipment Utilized During Treatment: Gait belt ? ?Activity Tolerance: Patient tolerated treatment well;Patient limited by fatigue;Patient limited by pain ?Patient left: in bed;with call bell/phone within reach;with bed alarm set ? ?   ?              ?  Time: 7591-6384 ?OT Time Calculation (min): 53 min ?Charges:  OT Evaluation ?$OT Eval High Complexity: 1 High ? ?Harrel Carina, MS, OTR/L  ? ?Harrel Carina ?02/15/2022, 5:38 PM ?

## 2022-02-15 NOTE — Progress Notes (Addendum)
?PROGRESS NOTE ? ? ? ?Laura Frey  MVH:[id number] DOB: August 23, 1932 DOA: 02/13/2022 ?PCP: Marinda Elk, MD  ? ? ? ? ?Brief Narrative:  ? ?Admitted with LLQ abdominal pain, found to have acute perforated diverticulitis. ? ? ?Assessment & Plan: ?  ?Principal Problem: ?  Abdominal pain ?Active Problems: ?  Chronic low back pain (1ry area of Pain) (Bilateral) (R>L) w/ sciatica (Bilateral) ?  Perforated diverticulum of intestine ?  Severe sepsis ([place]) ?  Anemia ?  Acute respiratory failure with hypoxia ([place]) ?  COPD (chronic obstructive pulmonary disease) ([place]) ?  Hypertension ?  Hypothyroid ?  Chronic diastolic CHF (congestive heart failure) ([place]) ?  Acute diverticulitis ? ?# Acute sigmoid diverticulitis with perforation ?Hemodynamically stable. Gen surg following. Symptoms improving. Labs stable ?- maintain NPO, sips w/ meds, advance diet per gen surg ?- no plan for surgery right now ?- continue cefepime/flagyl ?- cont ivf ?- start lovenox for ppx; PT/OT consulted ?- morphine for pain control ? ?# Acute hypoxic respiratory failure ?# COPD ?Likely 2/2 atelectasis, baseline COPD. CTA neg for PE, no concerning pulmonary findings. No wheeze or productive cough or sig sob to suggest copd exacerbation. Weaned down to 1 L today ?- incentive spirometry, continue [place] O2 ?- albuterol prn ? ?# HTN ?Here bp wnl  ?- hold amlodipine for now ?- cont home metop ? ?# Hypothyroid ?- cont home synthroid ? ?# Chronic pain ?- cont home duloxetine ? ? ? ?DVT prophylaxis: lovenox ?Code Status: full ?Family Communication: daughter updated @ bedside 5/2 ? ?Level of care: Med-Surg ?Status is: Inpatient ?Remains inpatient appropriate because: severity of illness ? ? ? ?Consultants:  ?Gen surg ? ?Procedures: ?none ? ?Antimicrobials:  ?Cefepime/flagyl 4/30>  ? ? ?Subjective: ?Ongoing llq pain and nausea, no vomiting. Stools loose.  ? ?Objective: ?Vitals:  ? 02/14/22 2319 02/15/22 8413 02/15/22 2440 02/15/22 0818  ?BP: (!) 133/53 (!) 120/54   (!) 127/54  ?Pulse: 87 91  96  ?Resp: '16 18  19  '$ ?Temp: 98.7 ?F (37.1 ?C)   98.2 ?F (36.8 ?C)  ?TempSrc: Oral   Oral  ?SpO2: 90% 94%  94%  ?Weight:   79.6 kg   ?Height:      ? ? ?Intake/Output Summary (Last 24 hours) at 02/15/2022 1217 ?Last data filed at 02/15/2022 1155 ?Gross per 24 hour  ?Intake --  ?Output 1900 ml  ?Net -1900 ml  ? ?Filed Weights  ? 02/13/22 1826 02/14/22 0332 02/15/22 0339  ?Weight: 73 kg 78.3 kg 79.6 kg  ? ? ?Examination: ? ?General exam: Appears calm and comfortable  ?Respiratory system: Clear to auscultation. Respiratory effort normal. ?Cardiovascular system: S1 & S2 heard, RRR. No JVD, murmurs, rubs, gallops or clicks. No pedal edema. ?Gastrointestinal system: Abdomen is nondistended, soft, ttp llq. No organomegaly or masses felt. Normal bowel sounds heard. ?Central nervous system: Alert and oriented. No focal neurological deficits. ?Extremities: Symmetric 5 x 5 power. ?Skin: No rashes, lesions or ulcers ?Psychiatry: Judgement and insight appear normal. Mood & affect appropriate.  ? ? ? ?Data Reviewed: I have personally reviewed following labs and imaging studies ? ?CBC: ?Recent Labs  ?Lab 02/13/22 ?2045 02/14/22 ?1027 02/15/22 ?0455  ?WBC 22.4* 19.3* 13.8*  ?NEUTROABS 19.4*  --   --   ?HGB 12.4 10.8* 10.2*  ?HCT 39.3 34.5* 32.1*  ?MCV 64.5* 64.5* 64.2*  ?PLT 279 232 178  ? ?Basic Metabolic Panel: ?Recent Labs  ?Lab 02/13/22 ?2045 02/14/22 ?2536 02/14/22 ?1028 02/14/22 ?1321 02/15/22 ?0951  ?  NA 137 136 135 135 134*  ?K 4.1 3.9 4.0 3.8 3.7  ?CL 101 104 102 100 100  ?CO2 '25 26 28 26 28  '$ ?GLUCOSE 125* 107* 95 99 90  ?BUN '14 15 16 15 12  '$ ?CREATININE 0.66 0.76 0.77 0.78 0.68  ?CALCIUM 9.2 8.4* 8.5* 8.6* 8.6*  ? ?GFR: ?Estimated Creatinine Clearance: 49.5 mL/min (by C-G formula based on SCr of 0.68 mg/dL). ?Liver Function Tests: ?Recent Labs  ?Lab 02/13/22 ?2045 02/14/22 ?1761  ?AST 29 20  ?ALT 18 16  ?ALKPHOS 79 66  ?BILITOT 1.2 1.3*  ?PROT 7.1 6.3*  ?ALBUMIN 3.9 3.5  ? ?No results for input(s):  LIPASE, AMYLASE in the last 168 hours. ?No results for input(s): AMMONIA in the last 168 hours. ?Coagulation Profile: ?No results for input(s): INR, PROTIME in the last 168 hours. ?Cardiac Enzymes: ?No results for input(s): CKTOTAL, CKMB, CKMBINDEX, TROPONINI in the last 168 hours. ?BNP (last 3 results) ?No results for input(s): PROBNP in the last 8760 hours. ?HbA1C: ?No results for input(s): HGBA1C in the last 72 hours. ?CBG: ?No results for input(s): GLUCAP in the last 168 hours. ?Lipid Profile: ?No results for input(s): CHOL, HDL, LDLCALC, TRIG, CHOLHDL, LDLDIRECT in the last 72 hours. ?Thyroid Function Tests: ?No results for input(s): TSH, T4TOTAL, FREET4, T3FREE, THYROIDAB in the last 72 hours. ?Anemia Panel: ?No results for input(s): VITAMINB12, FOLATE, FERRITIN, TIBC, IRON, RETICCTPCT in the last 72 hours. ?Urine analysis: ?   ?Component Value Date/Time  ? Level Green YELLOW 01/27/2014 2253  ? APPEARANCEUR Cloudy (A) 01/25/2021 1013  ? LABSPEC 1.005 01/27/2014 2253  ? LABSPEC 1.025 01/27/2014 1557  ? PHURINE 5.0 01/27/2014 2253  ? GLUCOSEU Negative 01/25/2021 1013  ? GLUCOSEU NEGATIVE 01/27/2014 1557  ? [place] NEGATIVE 01/27/2014 2253  ? BILIRUBINUR Negative 01/25/2021 1013  ? [place] NEGATIVE 01/27/2014 1557  ? [place] NEGATIVE 01/27/2014 2253  ? PROTEINUR Negative 01/25/2021 1013  ? PROTEINUR NEGATIVE 01/27/2014 2253  ? UROBILINOGEN 0.2 01/27/2014 2253  ? NITRITE Positive (A) 01/25/2021 1013  ? NITRITE NEGATIVE 01/27/2014 2253  ? LEUKOCYTESUR 1+ (A) 01/25/2021 1013  ? LEUKOCYTESUR NEGATIVE 01/27/2014 1557  ? ?Sepsis Labs: ?'@LABRCNTIP'$ (procalcitonin:4,lacticidven:4) ? ?) ?Recent Results (from the past 240 hour(s))  ?Resp Panel by RT-PCR (Flu A&B, Covid) Nasopharyngeal Swab     Status: None  ? Collection Time: 02/13/22  8:45 PM  ? Specimen: Nasopharyngeal Swab; Nasopharyngeal(NP) swabs in vial transport medium  ?Result Value Ref Range Status  ? SARS Coronavirus 2 by RT PCR NEGATIVE NEGATIVE Final  ?   Comment: (NOTE) ?SARS-CoV-2 target nucleic acids are NOT DETECTED. ? ?The SARS-CoV-2 RNA is generally detectable in upper respiratory ?specimens during the acute phase of infection. The lowest ?concentration of SARS-CoV-2 viral copies this assay can detect is ?138 copies/mL. A negative result does not preclude SARS-Cov-2 ?infection and should not be used as the sole basis for treatment or ?other patient management decisions. A negative result may occur with  ?improper specimen collection/handling, submission of specimen other ?than nasopharyngeal swab, presence of viral mutation(s) within the ?areas targeted by this assay, and inadequate number of viral ?copies(<138 copies/mL). A negative result must be combined with ?clinical observations, patient history, and epidemiological ?information. The expected result is Negative. ? ?Fact Sheet for Patients:  ?EntrepreneurPulse.com.au ? ?Fact Sheet for Healthcare Providers:  ?IncredibleEmployment.be ? ?This test is no t yet approved or cleared by the [place] FDA and  ?has been authorized for detection and/or diagnosis of SARS-CoV-2 by ?FDA under an Emergency Use  Authorization (EUA). This EUA will remain  ?in effect (meaning this test can be used) for the duration of the ?COVID-19 declaration under Section 564(b)(1) of the Act, 21 ?U.S.C.section 360bbb-3(b)(1), unless the authorization is terminated  ?or revoked sooner.  ? ? ?  ? Influenza A by PCR NEGATIVE NEGATIVE Final  ? Influenza B by PCR NEGATIVE NEGATIVE Final  ?  Comment: (NOTE) ?The Xpert Xpress SARS-CoV-2/FLU/RSV plus assay is intended as an aid ?in the diagnosis of influenza from Nasopharyngeal swab specimens and ?should not be used as a sole basis for treatment. Nasal washings and ?aspirates are unacceptable for Xpert Xpress SARS-CoV-2/FLU/RSV ?testing. ? ?Fact Sheet for Patients: ?EntrepreneurPulse.com.au ? ?Fact Sheet for Healthcare  Providers: ?IncredibleEmployment.be ? ?This test is not yet approved or cleared by the [place] FDA and ?has been authorized for detection and/or diagnosis of SARS-CoV-2 by ?FDA under an Emergency Use Aut

## 2022-02-15 NOTE — Evaluation (Signed)
Physical Therapy Evaluation ?Patient Details ?Name: Laura Frey ?MRN: [id number] ?DOB: 1932/08/12 ?Today's Date: 02/15/2022 ? ?History of Present Illness ? 88 y.o. female who was admitted to [facility] with Abdominal pain, perforated diverticulum of large intestine, Acute respiratory failure, Sepsis with ARF.  ?Clinical Impression ? Pt initially hesitant to do a lot with PT, but ultimately she did agree to get up and do some walking and surprised herself and this PT but walking ~125 ft with the walker.  She uses a 4WW at home and needed some extra cuing and education for appropriate use and positioning, but she improved speed and cadence with increased distance and plenty of encouragement.  Per surgery/medical interventions possible on this admission her functional situation could change, however per today's effort d/c to home with HHPT is an appropriate plan. ?   ? ?Recommendations for follow up therapy are one component of a multi-disciplinary discharge planning process, led by the attending physician.  Recommendations may be updated based on patient status, additional functional criteria and insurance authorization. ? ?Follow Up Recommendations Home health PT ? ?  ?Assistance Recommended at Discharge Intermittent Supervision/Assistance  ?Patient can return home with the following ? A little help with walking and/or transfers;A little help with bathing/dressing/bathroom;Assistance with cooking/housework;Assist for transportation ? ?  ?Equipment Recommendations None recommended by PT  ?Recommendations for Other Services ?    ?  ?Functional Status Assessment    ? ?  ?Precautions / Restrictions Precautions ?Precautions: Fall ?Restrictions ?Weight Bearing Restrictions: No  ? ?  ? ?Mobility ? Bed Mobility ?Overal bed mobility: Needs Assistance ?Bed Mobility: Supine to Sit ?  ?  ?Supine to sit: Min assist ?  ?  ?General bed mobility comments: Increased time, cues for encouragement, direct assist to get sidelying to sitting ?   ? ?Transfers ?Overall transfer level: Needs assistance ?Equipment used: Rolling walker (2 wheels) ?Transfers: Sit to/from Stand ?Sit to Stand: Min assist ?  ?  ?  ?  ?  ?General transfer comment: Pt unable to rise w/o assist from standard height bed, raised it ~2" and pt was able to initiate upward movement needing min assist to continue upright motion ?  ? ?Ambulation/Gait ?Ambulation/Gait assistance: Min assist ?Gait Distance (Feet): 125 Feet ?Assistive device: Rolling walker (2 wheels) ?  ?  ?  ?  ?General Gait Details: Pt initially very slow and hesitant, with cuing and some direct assist with walker she was able to increased confidence and cadence.  Needed regular cuing to insure she did not get leaning too far forward but did relatively well with fatigue but no LOBs.  Or remained in the 90s t/o the effort. ? ?Stairs ?  ?  ?  ?  ?  ? ?Wheelchair Mobility ?  ? ?Modified Rankin (Stroke Patients Only) ?  ? ?  ? ?Balance Overall balance assessment: Needs assistance ?  ?Sitting balance-Leahy Scale: Fair ?  ?  ?  ?Standing balance-Leahy Scale: Fair ?  ?  ?  ?  ?  ?  ?  ?  ?  ?  ?  ?  ?   ? ? ? ?Pertinent Vitals/Pain Pain Assessment ?Pain Assessment: 0-10 ?Pain Score: 4  ?Pain Location: L sided abdonminal pain  ? ? ?Home Living Family/patient expects to be discharged to:: Private residence ?Living Arrangements: Children (son lives with pt, pt will stay at daughter's home occasionally) ?Available Help at Discharge: Family;Available 24 hours/day ?Type of Home: House ?Home Access: Ramped entrance ?  ?  ?  ?  Home Layout: One level ?Home Equipment: Rollator (4 wheels);Grab bars - tub/shower;Hand held shower head;Shower seat ?   ?  ?Prior Function Prior Level of Function : Needs assist ?  ?  ?  ?  ?  ?  ?Mobility Comments: Uses a rollator for mobility, rarely out of the home ?ADLs Comments: Pt.'s son assists pt. with ADLs, and IADLs, meal preparation, medication managements, and pillbox set-up. Pt. does not drive. ?   ? ? ?Hand Dominance  ? Dominant Hand: Right ? ?  ?Extremity/Trunk Assessment  ? Upper Extremity Assessment ?Upper Extremity Assessment: Generalized weakness ?  ? ?Lower Extremity Assessment ?Lower Extremity Assessment: Generalized weakness ?  ? ?   ?Communication  ? Communication: No difficulties  ?Cognition Arousal/Alertness: Awake/alert ?Behavior During Therapy: [facility] for tasks assessed/performed ?Overall Cognitive Status: Within Functional Limits for tasks assessed ?  ?  ?  ?  ?  ?  ?  ?  ?  ?  ?  ?  ?  ?  ?  ?  ?  ?  ?  ? ?  ?General Comments   ? ?  ?Exercises    ? ?Assessment/Plan  ?  ?PT Assessment Patient needs continued PT services  ?PT Problem List Decreased strength;Decreased range of motion;Decreased activity tolerance;Decreased balance;Decreased mobility;Decreased safety awareness;Decreased knowledge of use of DME;Pain ? ?   ?  ?PT Treatment Interventions DME instruction;Gait training;Functional mobility training;Therapeutic activities;Therapeutic exercise;Balance training;Patient/family education   ? ?PT Goals (Current goals can be found in the Care Plan section)  ?Acute Rehab PT Goals ?Patient Stated Goal: go home ?PT Goal Formulation: With patient/family ?Time For Goal Achievement: 03/01/22 ?Potential to Achieve Goals: Fair ? ?  ?Frequency Min 2X/week ?  ? ? ?Co-evaluation   ?  ?  ?  ?  ? ? ?  ?AM-PAC PT "6 Clicks" Mobility  ?Outcome Measure Help needed turning from your back to your side while in a flat bed without using bedrails?: A Little ?Help needed moving from lying on your back to sitting on the side of a flat bed without using bedrails?: A Lot ?Help needed moving to and from a bed to a chair (including a wheelchair)?: A Little ?Help needed standing up from a chair using your arms (e.g., wheelchair or bedside chair)?: A Lot ?Help needed to walk in hospital room?: A Little ?Help needed climbing 3-5 steps with a railing? : A Lot ?6 Click Score: 15 ? ?  ?End of Session Equipment Utilized During  Treatment: Gait belt ?Activity Tolerance: Patient tolerated treatment well ?Patient left: with chair alarm set;with call bell/phone within reach;with family/visitor present ?Nurse Communication: Mobility status ?PT Visit Diagnosis: Muscle weakness (generalized) (M62.81);Difficulty in walking, not elsewhere classified (R26.2) ?  ? ?Time: 1610-9604 ?PT Time Calculation (min) (ACUTE ONLY): 28 min ? ? ?Charges:   PT Evaluation ?$PT Eval Low Complexity: 1 Low ?PT Treatments ?$Gait Training: 8-22 mins ?  ?   ? ? ?Kreg Shropshire, DPT ?02/15/2022, 6:40 PM ? ?

## 2022-02-15 NOTE — Progress Notes (Signed)
[facility] ?SURGICAL PROGRESS NOTE (cpt [phone]) ? ?Hospital Day(s): 1. ? ?Interval History: Patient seen and examined, no acute events or new complaints overnight. Patient reports she "does not feel well" this morning. She is unable to elaborate on this further. She continues to have chronic back pain. LLQ pain relatively unchanged. No fever, chills, nausea, emesis. Leukocytosis making improvements; down to 13.8K. Hgb stable at 10.2. Remains without a lactic acidosis. She continues on Cefepime and Flagyl. She is NPO.  ? ?Review of Systems:  ?Constitutional: denies fever, chills  ?HEENT: denies cough or congestion  ?Respiratory: denies any shortness of breath  ?Cardiovascular: denies chest pain or palpitations  ?Gastrointestinal: + abdominal pain, denied N/V ?Genitourinary: denies burning with urination or urinary frequency ?Musculoskeletal: + back pain, denies decreased motor or sensation ? ? ?Vital signs in last 24 hours: [min-max] current  ?Temp:  [98.1 ?F (36.7 ?C)-98.7 ?F (37.1 ?C)] 98.7 ?F (37.1 ?C) (05/01 2319) ?Pulse Rate:  [86-93] 91 (05/02 0338) ?Resp:  [16-20] 18 (05/02 0338) ?BP: (120-137)/(50-62) 120/54 (05/02 0338) ?SpO2:  [90 %-97 %] 94 % (05/02 0338) ?Weight:  [79.6 kg] 79.6 kg (05/02 0339)     Height: '5\' 3"'$  (160 cm) Weight: 79.6 kg BMI (Calculated): 31.09  ? ?Intake/Output last 2 shifts:  ?05/01 0701 - 05/02 0700 ?In: -  ?Out: 1200 [Urine:1200]  ? ?Physical Exam:  ?Constitutional: alert, cooperative and no distress  ?HENT: normocephalic without obvious abnormality  ?Eyes: PERRL, EOM's grossly intact and symmetric  ?Respiratory: breathing non-labored at rest  ?Cardiovascular: regular rate and sinus rhythm  ?Gastrointestinal: Soft, LLQ abdominal tenderness maybe slightly improved, non-distended, no rebound/guarding. She is not overtly peritonitic ?Musculoskeletal: UE and LE FROM, no edema or wounds, motor and sensation grossly intact, NT  ? ? ?Labs:  ? ?  Latest Ref Rng & Units  02/15/2022  ?  4:55 AM 02/14/2022  ?  5:23 AM 02/13/2022  ?  8:45 PM  ?CBC  ?WBC 4.0 - 10.5 K/uL 13.8   19.3   22.4    ?Hemoglobin 12.0 - 15.0 g/dL 10.2   10.8   12.4    ?Hematocrit 36.0 - 46.0 % 32.1   34.5   39.3    ?Platelets 150 - 400 K/uL 178   232   279    ? ? ?  Latest Ref Rng & Units 02/14/2022  ?  1:21 PM 02/14/2022  ? 10:28 AM 02/14/2022  ?  5:23 AM  ?CMP  ?Glucose 70 - 99 mg/dL 99   95   107    ?BUN 8 - 23 mg/dL '15   16   15    '$ ?Creatinine 0.44 - 1.00 mg/dL 0.78   0.77   0.76    ?Sodium 135 - 145 mmol/L 135   135   136    ?Potassium 3.5 - 5.1 mmol/L 3.8   4.0   3.9    ?Chloride 98 - 111 mmol/L 100   102   104    ?CO2 22 - 32 mmol/L '26   28   26    '$ ?Calcium 8.9 - 10.3 mg/dL 8.6   8.5   8.4    ?Total Protein 6.5 - 8.1 g/dL   6.3    ?Total Bilirubin 0.3 - 1.2 mg/dL   1.3    ?Alkaline Phos 38 - 126 U/L   66    ?AST 15 - 41 U/L   20    ?ALT 0 - 44 U/L   16    ? ? ? ?  Imaging studies: No new pertinent imaging studies ? ? ?Assessment/Plan: (ICD-10's: K57.20) ?88 y.o. female with persistent abdominal pain but improved leukocytosis found to have perforated diverticulitis without abscess or peritonitis ?  ?- No emergent surgical interventions. Will attempt conservative measures as outlined below. She understands that should she fail to improve or clinically deteriorate, we would proceed with urgent intervention and likely temporizing colostomy.   ?- If she fails to make improvement tomorrow, we may need to consider repeating CT Abdomen/Pelvis in next 24-48 hours.  ?          - NPO + IVF resuscitation        ?- IV Abx ([facility]); Day 2 ?- Monitor abdominal examination ?- Pain control prn; antiemetics prn ?          - Further management per primary service; we will follow  ? ?All of the above findings and recommendations were discussed with the patient, patient's family (son at bedside), and the medical team, and all of patient's and family's questions were answered to their expressed satisfaction. ? ? ?-- ?Edison Simon,  PA-C ?[facility] ?02/15/2022, 7:23 AM ?M-F: 7am - 4pm ? ?

## 2022-02-15 NOTE — TOC CM/SW Note (Signed)
?  Transition of Care (TOC) Screening Note ? ? ?Patient Details  ?Name: Laura Frey ?Date of Birth: [birth year] ? ? ?Transition of Care (TOC) CM/SW Contact:    ?Candie Chroman, LCSW ?Phone Number: ?02/15/2022, 9:12 AM ? ? ? ?Transition of Care Department [facility]) has reviewed patient and no TOC needs have been identified at this time. We will continue to monitor patient advancement through interdisciplinary progression rounds. If new patient transition needs arise, please place a TOC consult. ? ? ?

## 2022-02-16 ENCOUNTER — Encounter: Payer: Self-pay | Admitting: Obstetrics and Gynecology

## 2022-02-16 ENCOUNTER — Inpatient Hospital Stay: Payer: Medicare Other

## 2022-02-16 DIAGNOSIS — K5780 Diverticulitis of intestine, part unspecified, with perforation and abscess without bleeding: Secondary | ICD-10-CM

## 2022-02-16 DIAGNOSIS — K5720 Diverticulitis of large intestine with perforation and abscess without bleeding: Secondary | ICD-10-CM | POA: Diagnosis not present

## 2022-02-16 LAB — CBC
HCT: 32.3 % — ABNORMAL LOW (ref 36.0–46.0)
Hemoglobin: 10.1 g/dL — ABNORMAL LOW (ref 12.0–15.0)
MCH: 19.9 pg — ABNORMAL LOW (ref 26.0–34.0)
MCHC: 31.3 g/dL (ref 30.0–36.0)
MCV: 63.7 fL — ABNORMAL LOW (ref 80.0–100.0)
Platelets: 219 10*3/uL (ref 150–400)
RBC: 5.07 MIL/uL (ref 3.87–5.11)
RDW: 15.9 % — ABNORMAL HIGH (ref 11.5–15.5)
WBC: 9.3 10*3/uL (ref 4.0–10.5)
nRBC: 0 % (ref 0.0–0.2)

## 2022-02-16 LAB — BASIC METABOLIC PANEL
Anion gap: 7 (ref 5–15)
BUN: 11 mg/dL (ref 8–23)
CO2: 27 mmol/L (ref 22–32)
Calcium: 8.4 mg/dL — ABNORMAL LOW (ref 8.9–10.3)
Chloride: 100 mmol/L (ref 98–111)
Creatinine, Ser: 0.69 mg/dL (ref 0.44–1.00)
GFR, Estimated: >60 mL/min (ref >60)
Glucose, Bld: 121 mg/dL — ABNORMAL HIGH (ref 70–99)
Potassium: 3.3 mmol/L — ABNORMAL LOW (ref 3.5–5.1)
Sodium: 134 mmol/L — ABNORMAL LOW (ref 135–145)

## 2022-02-16 LAB — MAGNESIUM: Magnesium: 1.8 mg/dL (ref 1.7–2.4)

## 2022-02-16 MED ORDER — IOHEXOL 9 MG/ML PO SOLN
500.0000 mL | ORAL | Status: AC
  Administered 2022-02-16 (×2): 500 mL via ORAL

## 2022-02-16 MED ORDER — IOHEXOL 300 MG/ML  SOLN
100.0000 mL | Freq: Once | INTRAMUSCULAR | Status: AC | PRN
  Administered 2022-02-16: 100 mL via INTRAVENOUS

## 2022-02-16 MED ORDER — POTASSIUM CHLORIDE 10 MEQ/100ML IV SOLN
10.0000 meq | INTRAVENOUS | Status: AC
  Administered 2022-02-16 (×3): 10 meq via INTRAVENOUS
  Filled 2022-02-16: qty 100

## 2022-02-16 NOTE — Hospital Course (Addendum)
Taken from prior notes. ? ?Laura Frey is a 88 y.o. female with medical history significant of CHF, COPD, Htn, and c/h back pain coming for rt sided lower back pain and left lower quadrant pain as well. The LLQ pain started after she slid off the from commode  she did not fall or have LOC or hit her head or injure herself in anyway and that was today. ?Pt also reported few days of diarrhea. ?CT abdomen concerning for perforated diverticulitis.  General surgery was consulted and they decided to proceed with conservative measures.  Repeat CT abdomen with mildly decreased pneumo peritoneum but there is a new loculated effusion concerning for abscess development. ?IR was consulted for percutaneous drain placement. ? ?Lower extremity venous Doppler and CTA was obtained for concern of shortness of breath and mild hypoxia, both studies were negative for DVT and PE respectively. ? ?Patient met sepsis criteria secondary to perforated diverticulitis. ?She was started on cefepime and Flagyl and received IV fluid. ?She is currently on clear liquid with n.p.o. after midnight for IR procedure tomorrow. ? ?5/4: Patient has successful gluteal region drain placed with IR, removal of about 25 cc of serous/postural fluid, cultures sent.  Patient tolerated the procedure well. ?Surgery to continue with conservative measures and clear liquid diet. ?We will continue current antibiotics-de-escalate once culture data is available. ?PT is recommending SNF, TOC started the process. ? ?5/5: Patient remained stable with minimum serous discharge and drain.  No bowel movement yet but passing flatus.  Diet advanced to full liquid. ?Surgery would like to continue conservative management and might add some bowel regimen tomorrow if able to tolerate full liquid diet. ? ?5/6: Remains stable.  Diet advanced to soft.  Surgery is not recommending completion of 7 days of outpatient antibiotic with Augmentin.  Abscess cultures remain  negative. ?Patient is stable for discharge and will follow-up with general surgery as an out. ?Waiting for SNF ? ?5/7: Patient remained stable, having bowel movements.  Tolerating soft diet. ?Surgery is not recommending outpatient follow-up. ?Stable for discharge after getting insurance authorization for SNF. ? ?5/8: Patient remained stable.  She is being discharged to rehab for further management. ?Patient will take Augmentin for 5 more days as recommended by general surgery and follow-up with them as an outpatient. ?Patient is also being discharged with drain and will follow-up in drain clinic, most likely continue to have drain for next 2 to 3 weeks. ? ?She will continue the rest of her home medications and follow-up with her providers. ?

## 2022-02-16 NOTE — Progress Notes (Addendum)
ADDENDUM 2:45 PM  ?CT Abdomen/Pelvis personally reviewed and discussed with interventional radiologist (Dr Denna Haggard). Pelvic fluid collection amenable to percutaneous drainage will plan for this tomorrow (05/04). Otherwise CT without any worsening pneumoperitoneum.  ? ?Plan:  ?-- CLD for tonight; NPO at midnight ?-- Plan for percutaneous drain placement tomorrow (05/04) with IR. Assistance is greatly appreciated.  ?-- Hold prophylactic Lovenox ? ? ?[facility] ?SURGICAL PROGRESS NOTE (cpt [phone]) ? ?Hospital Day(s): 2. ? ?Interval History: Patient seen and examined, no acute events or new complaints overnight. Patient reports she is "achy all over from my arthritis." She is unsure of any abdominal pain because "she just doesn't move." No fever, chills, nausea, emesis. Marland Kitchen Her leukocytosis has resolved this morning; now 9.3K. Hgb remains stable at 10.1. Renal function normal; sCr - 0.69; UO - 1400 ccs. Mild hypokalemia to 3.3. She continues on Cefepime & Flagyl. She is NPO this morning ? ?Review of Systems:  ?Constitutional: denies fever, chills  ?HEENT: denies cough or congestion  ?Respiratory: denies any shortness of breath  ?Cardiovascular: denies chest pain or palpitations  ?Gastrointestinal: + abdominal pain, denied N/V ?Genitourinary: denies burning with urination or urinary frequency ?Musculoskeletal: + back pain, denies decreased motor or sensation ? ? ?Vital signs in last 24 hours: [min-max] current  ?Temp:  [98.2 ?F (36.8 ?C)-99.1 ?F (37.3 ?C)] 98.2 ?F (36.8 ?C) (05/03 1308) ?Pulse Rate:  [85-96] 91 (05/03 0437) ?Resp:  [18-19] 18 (05/03 0437) ?BP: (122-135)/(52-60) 122/52 (05/03 0437) ?SpO2:  [89 %-95 %] 92 % (05/03 0518) ?Weight:  [80 kg] 80 kg (05/03 0500)     Height: '5\' 3"'$  (160 cm) Weight: 80 kg BMI (Calculated): 31.25  ? ?Intake/Output last 2 shifts:  ?05/02 0701 - 05/03 0700 ?In: 1345.8 [I.V.:955.8; IV MVHQIONGE:952] ?Out: 1400 [Urine:1400]  ? ?Physical Exam:  ?Constitutional: alert,  cooperative and no distress  ?HENT: normocephalic without obvious abnormality  ?Eyes: PERRL, EOM's grossly intact and symmetric  ?Respiratory: breathing non-labored at rest  ?Cardiovascular: regular rate and sinus rhythm  ?Gastrointestinal: Soft, LLQ abdominal tenderness is unchanged in last 24 hours, non-distended, no rebound/guarding. She is not overtly peritonitic ?Musculoskeletal: UE and LE FROM, no edema or wounds, motor and sensation grossly intact, NT  ? ? ?Labs:  ? ?  Latest Ref Rng & Units 02/16/2022  ?  5:28 AM 02/15/2022  ?  4:55 AM 02/14/2022  ?  5:23 AM  ?CBC  ?WBC 4.0 - 10.5 K/uL 9.3   13.8   19.3    ?Hemoglobin 12.0 - 15.0 g/dL 10.1   10.2   10.8    ?Hematocrit 36.0 - 46.0 % 32.3   32.1   34.5    ?Platelets 150 - 400 K/uL 219   178   232    ? ? ?  Latest Ref Rng & Units 02/16/2022  ?  5:28 AM 02/15/2022  ?  9:51 AM 02/14/2022  ?  1:21 PM  ?CMP  ?Glucose 70 - 99 mg/dL 121   90   99    ?BUN 8 - 23 mg/dL '11   12   15    '$ ?Creatinine 0.44 - 1.00 mg/dL 0.69   0.68   0.78    ?Sodium 135 - 145 mmol/L 134   134   135    ?Potassium 3.5 - 5.1 mmol/L 3.3   3.7   3.8    ?Chloride 98 - 111 mmol/L 100   100   100    ?CO2 22 - 32 mmol/L  $'27   28   26    'a$ ?Calcium 8.9 - 10.3 mg/dL 8.4   8.6   8.6    ? ? ? ?Imaging studies: No new pertinent imaging studies ? ? ?Assessment/Plan: (ICD-10's: K57.20) ?88 y.o. female with persistent abdominal pain but resolved leukocytosis found to have perforated diverticulitis without abscess or peritonitis ?  ? - Although she has laboratory improvement and remains hemodynamically stable, her abdominal examination is relatively unchanged. Given initial concern for perforated diverticulitis, I will repeat CT Abdomen/Pelvis today to reassess her intra-abdominal process and ensure no significant worsening/change.  ? ?- No emergent surgical interventions. Will attempt conservative measures as outlined below. She understands that should she fail to improve or clinically deteriorate, we would proceed with  urgent intervention and likely temporizing colostomy.   ?          - NPO + IVF resuscitation        ?- IV Abx (Cefepime / Flagyl); Day 3 ?- Monitor abdominal examination ?- Pain control prn; antiemetics prn ?          - Further management per primary service; we will follow  ? ?All of the above findings and recommendations were discussed with the patient, patient's family (son at bedside), and the medical team, and all of patient's and family's questions were answered to their expressed satisfaction. ? ?-- ?Edison Simon, PA-C ?[facility] ?02/16/2022, 7:27 AM ?M-F: 7am - 4pm ? ?

## 2022-02-16 NOTE — Progress Notes (Signed)
Mobility Specialist - Progress Note ? ? 02/16/22 1400  ?Mobility  ?Range of Motion/Exercises Right leg;Left leg  ?Level of Assistance Standby assist, set-up cues, supervision of patient - no hands on  ?Distance Ambulated (ft) 0 ft  ?Activity Response Tolerated well  ?$Mobility charge 1 Mobility  ? ? ? ?Pt lying in bed upon arrival, utilizing 1L. Pt declined OOB and EOB activity this date, but agreeable to supine therex with encouragement. No complaints. Pt left in bed upon OT arrival. Family at bedside.  ? ? ?Kathee Delton ?Mobility Specialist ?02/16/22, 2:26 PM ? ? ? ? ?

## 2022-02-16 NOTE — TOC Initial Note (Signed)
Transition of Care (TOC) - Initial/Assessment Note  ? ? ?Patient Details  ?Name: Laura Frey ?MRN: [id number] ?Date of Birth: [birth year] ? ?Transition of Care (TOC) CM/SW Contact:    ?Candie Chroman, LCSW ?Phone Number: ?02/16/2022, 10:24 AM ? ?Clinical Narrative:   CSW met with patient. Sons at bedside. CSW introduced role and explained that therapy recommendations would be discussed. Patient and sons are agreeable to home health. She has worked with [facility] in the past and would like them again. Referral accepted for PT, OT, RN, aide. Discussed recommendation for 3-in-1 but son said they already had that DME at home. No further concerns. CSW encouraged patient and her sons to contact CSW as needed. CSW will continue to follow patient and her family for support and facilitate return home when stable.              ? ?Expected Discharge Plan: [place] ?Barriers to Discharge: Continued Medical Work up ? ? ?Patient Goals and CMS Choice ?  ?  ?Choice offered to / list presented to : Patient, Adult Children ? ?Expected Discharge Plan and Services ?Expected Discharge Plan: [place] ?  ?  ?Post Acute Care Choice: Home Health ?Living arrangements for the past 2 months: [place] ?                ?  ?  ?  ?  ?  ?HH Arranged: OT, PT, Nurse's Aide, RN ?[place] Agency: [place] ([place]) ?Date HH Agency Contacted: 02/16/22 ?  ?Representative spoke with at [place]: Floydene Flock ? ?Prior Living Arrangements/Services ?Living arrangements for the past 2 months: [place] ?Lives with:: Adult Children ?Patient language and need for interpreter reviewed:: Yes ?Do you feel safe going back to the place where you live?: Yes      ?Need for Family Participation in Patient Care: Yes (Comment) ?Care giver support system in place?: Yes (comment) ?Current home services: DME ?Criminal Activity/Legal Involvement Pertinent to Current Situation/Hospitalization: No - Comment as  needed ? ?Activities of Daily Living ?Home Assistive Devices/Equipment: Dentures (specify type), Eyeglasses, Shower chair with back, Walker (specify type) ?ADL Screening (condition at time of admission) ?Patient's cognitive ability adequate to safely complete daily activities?: Yes ?Is the patient deaf or have difficulty hearing?: Yes (wears hearing aid) ?Does the patient have difficulty seeing, even when wearing glasses/contacts?: No ?Does the patient have difficulty concentrating, remembering, or making decisions?: No ?Patient able to express need for assistance with ADLs?: Yes ?Does the patient have difficulty dressing or bathing?: Yes ?Independently performs ADLs?: No ?Communication: Independent ?Dressing (OT): Needs assistance ?Is this a change from baseline?: Pre-admission baseline ?Grooming: Needs assistance ?Feeding: Needs assistance ?Is this a change from baseline?: Pre-admission baseline ?Bathing: Needs assistance ?Is this a change from baseline?: Pre-admission baseline ?Toileting: Independent ?In/Out Bed: Independent ?Walks in Home: Independent with device (comment) ?Does the patient have difficulty walking or climbing stairs?: Yes ?Weakness of Legs: Both ?Weakness of Arms/Hands: None ? ?Permission Sought/Granted ?Permission sought to share information with : Facility Sport and exercise psychologist, Family Supports ?Permission granted to share information with : Yes, Verbal Permission Granted ? Share Information with NAME: Barbaraann Rondo and Lorali Khamis ? Permission granted to share info w AGENCY: [facility] ? Permission granted to share info w Relationship: Sons ? Permission granted to share info w Contact Information: Barbaraann Rondo: [phone]: [phone] ? ?Emotional Assessment ?Appearance:: Appears stated age ?Attitude/Demeanor/Rapport: Engaged, Gracious ?Affect (typically observed): Accepting, Appropriate,  Calm, Pleasant ?Orientation: : Oriented to Self, Oriented to Place, Oriented to  Time, Oriented to  Situation ?Alcohol / Substance Use: Not Applicable ?Psych Involvement: No (comment) ? ?Admission diagnosis:  Perforated diverticulum of large intestine [K57.20] ?Acute respiratory failure with hypoxia ([place]) [J96.01] ?Abdominal pain [R10.9] ?Sepsis with acute respiratory failure without septic shock, due to unspecified organism, unspecified whether hypoxia or hypercapnia present ([place]) [A41.9, R65.20, J96.00] ?Acute diverticulitis [K57.92] ?Patient Active Problem List  ? Diagnosis Date Noted  ? Perforated diverticulum of intestine 02/14/2022  ? CHF (congestive heart failure) ([place]) 02/14/2022  ? Chronic diastolic CHF (congestive heart failure) ([place]) 02/14/2022  ? Abdominal pain 02/14/2022  ? Acute respiratory failure with hypoxia ([place]) 02/14/2022  ? Acute diverticulitis 02/14/2022  ? Closed compression fracture of L3 lumbar vertebra, initial encounter ([place]) 02/16/2021  ? Chronic low back pain (1ry area of Pain) (Bilateral) (R>L) w/o sciatica 01/07/2021  ? Spondylosis without myelopathy or radiculopathy, lumbar region 08/09/2018  ? Osteopenia of lumbar spine 08/06/2018  ? Complaints of weakness of lower extremity 07/17/2018  ? Lower extremity weakness 04/04/2018  ? Lower extremity numbness 04/04/2018  ? Disorder of skeletal system 01/01/2018  ? Pharmacologic therapy 01/01/2018  ? Problems influencing health status 01/01/2018  ? Cervicalgia 01/01/2018  ? Chronic upper back pain 01/01/2018  ? Lower extremity weakness (Bilateral) 01/01/2018  ? Frequent falls 12/11/2017  ? Gait instability 12/11/2017  ? COPD (chronic obstructive pulmonary disease) ([place]) 11/16/2017  ? Thalassemia 11/16/2017  ? Hypertension 11/16/2017  ? Hypothyroid 11/16/2017  ? Abnormal MRI, lumbar spine (2018) 11/16/2017  ? L2 compression fracture 11/16/2017  ? H/O cervical spine surgery (C4-C7 ACDF) 11/16/2017  ? Abnormal MRI, cervical spine (2014) 11/16/2017  ? Chronic low back pain (1ry area of Pain) (Bilateral) (R>L) w/ sciatica (Bilateral) 11/16/2017   ? Lumbar facet joint osteoarthritis (Bilateral) 11/16/2017  ? Lumbar facet hypertrophy (Bilateral) 11/16/2017  ? Lumbar facet syndrome (Bilateral) 11/16/2017  ? Osteoarthritis of lumbar spine 11/16/2017  ? Lumbar spondylosis 11/16/2017  ? Cervical spondylosis 11/16/2017  ? Cervical facet arthropathy (Bilateral) 11/16/2017  ? Chronic pain syndrome 11/16/2017  ? Osteoarthritis 11/16/2017  ? Lumbar Grade 1 Anterolisthesis of L3/L4 11/16/2017  ? Lumbosacral Grade 1 Retrolisthesis of L5/S1 11/16/2017  ? Lumbar lateral recess stenosis (Bilateral) 11/16/2017  ? Chronic lower extremity pain (2ry area of Pain) (Bilateral) (R>L) 11/16/2017  ? Chronic lower extremity radicular pain (Bilateral) (R>L) 11/16/2017  ? Chronic lumbar radiculitis (Right) 11/16/2017  ? DDD (degenerative disc disease), lumbar 11/16/2017  ? DDD (degenerative disc disease), cervical 11/16/2017  ? Hypoxia 02/27/2014  ? Anemia, unspecified 02/27/2014  ? Urinary tract infection 02/27/2014  ? Diastolic CHF, acute ([place]) 02/08/2014  ? Acute diastolic heart failure ([place]) 02/08/2014  ? Shortness of breath 02/05/2014  ? HTN (hypertension) 02/05/2014  ? Anemia 02/05/2014  ? SOB (shortness of breath) 02/05/2014  ? Nonspecific (abnormal) findings on radiological and other examination of gastrointestinal tract 01/31/2014  ? Severe sepsis ([place]) 01/27/2014  ? Hypotension, unspecified 01/27/2014  ? UTI (urinary tract infection) 01/27/2014  ? Unspecified hypothyroidism 01/27/2014  ? GERD (gastroesophageal reflux disease) 01/27/2014  ? Esophageal reflux 01/27/2014  ? Increased frequency of urination 02/12/2013  ? Other chronic cystitis without hematuria 02/12/2013  ? Recurrent UTI 02/12/2013  ? Urge incontinence 02/12/2013  ? ?PCP:  Marinda Elk, MD ?Pharmacy:   ?[facility] - [place], [address] ?[address] ?[place] [address] ?Phone: [phone] Fax: [phone] ? ?[facility] [place], [place] -  [address] ?[address] ?[place] [address] ?Phone: [phone] Fax: [phone] ? ? ? ? ?Social Determinants of Health (SDOH) Interventions ?  ? ?Readmission Risk Interventions ?   ? Noni Saupe

## 2022-02-16 NOTE — Progress Notes (Signed)
Occupational Therapy Treatment ?Patient Details ?Name: Laura Frey ?MRN: [id number] ?DOB: 10-18-1931 ?Today's Date: 02/16/2022 ? ? ?History of present illness 88 y.o. female who was admitted to [facility] with Abdominal pain, perforated diverticulum of large intestine, Acute respiratory failure, Sepsis with ARF. ?  ?OT comments ? Chart reviewed to date, pt greeted in room with son and DIL present. Tx session targeted improving activity tolerance and endurance in order to improve functional mobility and ADL status to facilitate return to PLOF. Improvements noted with STS requiring MIN A, and short amb transfer with MIN A. Pt endorses feeling generally weak, requesting to eat as pt is NPOstatus. Team notified of pt request. Pt is left in bedside chair, NAD, all needs met. OT will continue to follow acutely.   ? ?Recommendations for follow up therapy are one component of a multi-disciplinary discharge planning process, led by the attending physician.  Recommendations may be updated based on patient status, additional functional criteria and insurance authorization. ?   ?Follow Up Recommendations ? Home health OT  ?  ?Assistance Recommended at Discharge Intermittent Supervision/Assistance  ?Patient can return home with the following ? A lot of help with bathing/dressing/bathroom;Assistance with cooking/housework;Direct supervision/assist for medications management;Help with stairs or ramp for entrance ?  ?Equipment Recommendations ? BSC/3in1  ?  ?Recommendations for Other Services   ? ?  ?Precautions / Restrictions Precautions ?Precautions: Fall ?Restrictions ?Weight Bearing Restrictions: No  ? ? ?  ? ?Mobility Bed Mobility ?Overal bed mobility: Needs Assistance ?Bed Mobility: Supine to Sit ?  ?  ?Supine to sit: Min assist, HOB elevated ?  ?  ?General bed mobility comments: step by step vcs for encouragement ?  ? ?Transfers ?Overall transfer level: Needs assistance ?Equipment used: Rolling walker (2 wheels) ?Transfers: Sit  to/from Stand ?Sit to Stand: Min assist ?  ?  ?  ?  ?  ?  ?  ?  ?Balance Overall balance assessment: Needs assistance ?Sitting-balance support: Feet unsupported ?Sitting balance-Leahy Scale: Fair ?  ?  ?Standing balance support: Reliant on assistive device for balance, Bilateral upper extremity supported, During functional activity ?Standing balance-Leahy Scale: Fair ?  ?  ?  ?  ?  ?  ?  ?  ?  ?  ?  ?  ?   ? ?ADL either performed or assessed with clinical judgement  ? ?ADL Overall ADL's : Needs assistance/impaired ?Eating/Feeding: Sitting;Set up ?  ?  ?  ?  ?  ?  ?  ?  ?  ?Lower Body Dressing: Maximal assistance;Bed level ?Lower Body Dressing Details (indicate cue type and reason): pt reports this is baseline ?Toilet Transfer: Min guard;Minimal assistance;Rolling walker (2 wheels) ?Toilet Transfer Details (indicate cue type and reason): simulated, short amb transfer to bedside chair ?  ?  ?  ?  ?  ?  ?  ? ?Extremity/Trunk Assessment   ?  ?  ?  ?  ?  ? ?Vision   ?  ?  ?Perception   ?  ?Praxis   ?  ? ?Cognition Arousal/Alertness: Awake/alert ?Behavior During Therapy: [facility] for tasks assessed/performed ?Overall Cognitive Status: Impaired/Different from baseline ?Area of Impairment: Problem solving ?  ?  ?  ?  ?  ?  ?  ?  ?  ?  ?  ?  ?  ?  ?Problem Solving: Slow processing, Decreased initiation, Difficulty sequencing, Requires verbal cues, Requires tactile cues ?  ?  ?  ?   ?Exercises Other Exercises ?Other Exercises: edu  pt and son/DIL re: role of OT, discharge recommendations, importance of oob mobility ? ?  ?Shoulder Instructions   ? ? ?  ?General Comments vss throughout, pt endorses general fatigue from npo status  ? ? ?Pertinent Vitals/ Pain       Pain Assessment ?Pain Assessment: No/denies pain ? ?Home Living   ?  ?  ?  ?  ?  ?  ?  ?  ?  ?  ?  ?  ?  ?  ?  ?  ?  ?  ? ?  ?Prior Functioning/Environment    ?  ?  ?  ?   ? ?Frequency ? Min 2X/week  ? ? ? ? ?  ?Progress Toward Goals ? ?OT Goals(current goals can now be  found in the care plan section) ? Progress towards OT goals: Progressing toward goals ? ?Acute Rehab OT Goals ?Patient Stated Goal: return home ?OT Goal Formulation: With patient ?Time For Goal Achievement: 03/02/22 ?Potential to Achieve Goals: Good  ?Plan Discharge plan remains appropriate   ? ?Co-evaluation ? ? ?   ?  ?  ?  ?  ? ?  ?AM-PAC OT "6 Clicks" Daily Activity     ?Outcome Measure ? ? Help from another person eating meals?: None ?Help from another person taking care of personal grooming?: A Little ?Help from another person toileting, which includes using toliet, bedpan, or urinal?: A Lot ?Help from another person bathing (including washing, rinsing, drying)?: A Lot ?Help from another person to put on and taking off regular upper body clothing?: A Little ?Help from another person to put on and taking off regular lower body clothing?: A Lot ?6 Click Score: 16 ? ?  ?End of Session Equipment Utilized During Treatment: Gait belt;Rolling walker (2 wheels);Oxygen ? ?OT Visit Diagnosis: Unsteadiness on feet (R26.81);Muscle weakness (generalized) (M62.81) ?  ?Activity Tolerance Patient tolerated treatment well ?  ?Patient Left in chair;with call bell/phone within reach;with chair alarm set;with family/visitor present ?  ?Nurse Communication Mobility status ?  ? ?   ? ?Time: 3014-1597 ?OT Time Calculation (min): 29 min ? ?Charges: OT General Charges ?$OT Visit: 1 Visit ?OT Treatments ?$Self Care/Home Management : 8-22 mins ?$Therapeutic Activity: 8-22 mins ?Shanon Payor, OTD OTR/L  ?02/16/22, 4:15 PM  ?

## 2022-02-16 NOTE — H&P (Signed)
Chief Complaint: Patient was seen in consultation today for pelvic abscess drain  at the request of Denna Haggard, PA  Referring Physician(s): Denna Haggard, PA  Supervising Physician: Pernell Dupre  Patient Status: [facility] - In-pt  History of Present Illness: Laura Frey is a 88 y.o. female with medical history significant for asthma, anemia, melanoma, GERD, HA, PVD, SOB and UTI. Pt presented to ED 02/13/22 c/o LLQ pain x 3 days and diarrhea. Pt had CT abd/pelvis that showed pelvic abscess. Pt was referred by Denna Haggard, PA to IR for pelvic fluid abscess drain. Procedure was approved by Dr. Juliette Alcide. Procedure tentatively scheduled for 02/17/22.   Past Medical History:  Diagnosis Date   Anemia    hx Thalassemia minor anemia   Asthma    Cancer (HCC)    melanoma on back   Chronic back pain    ESI/uses BC powders   Depression    GERD (gastroesophageal reflux disease)    Headache(784.0)    Hypertension    Hypothyroidism    Peripheral vascular disease (HCC)    legs   Sepsis (HCC) 01/27/2014   Shortness of breath    Tendinitis of both rotator cuffs    UTI (urinary tract infection)     Past Surgical History:  Procedure Laterality Date   ABDOMINAL HYSTERECTOMY     vagina   ANTERIOR CERVICAL DECOMP/DISCECTOMY FUSION N/A 05/01/2013   Procedure: ANTERIOR CERVICAL DECOMPRESSION/DISCECTOMY FUSION 3 LEVELS;  Surgeon: Mariam Dollar, MD;  Location: [facility];  Service: Neurosurgery;  Laterality: N/A;  ANTERIOR CERVICAL DECOMPRESSION/DISCECTOMY FUSION 3 LEVELS   APPENDECTOMY     BLADDER REPAIR     after hysterectomy same day   EYE SURGERY Bilateral    cataracts   TONSILLECTOMY     TUBAL LIGATION      Allergies: Ciprofloxacin, Hydrocodone, Oxycodone, Zoloft [sertraline hcl], Sertraline, Sulfa antibiotics, Tylenol [acetaminophen], and Ultram [tramadol]  Medications: Prior to Admission medications   Medication Sig Start Date End Date Taking? Authorizing Provider   amLODipine (NORVASC) 5 MG tablet Take 2 tablets (10 mg total) by mouth daily. 02/04/14  Yes Rhetta Mura, MD  Aspirin-Caffeine 1000-65 MG PACK Take 1 packet by mouth 2 (two) times daily.   Yes [provider]  Cholecalciferol 10 MCG (400 UNIT) CAPS Take by mouth. Take by mouth daily.   Yes [provider]  DULoxetine (CYMBALTA) 60 MG capsule  05/14/18  Yes [provider]  fexofenadine (ALLEGRA) 180 MG tablet Take 180 mg by mouth daily.   Yes [provider]  levothyroxine (SYNTHROID, LEVOTHROID) 75 MCG tablet Take 75 mcg by mouth daily before breakfast.   Yes [provider]  metoprolol (TOPROL-XL) 200 MG 24 hr tablet Take 200 mg by mouth daily.   Yes [provider]  Multiple Vitamin (MULTIVITAMIN WITH MINERALS) TABS Take 1 tablet by mouth daily.   Yes [provider]  omeprazole (PRILOSEC) 20 MG capsule Take 20 mg by mouth daily.   Yes [provider]  VITAMIN E PO Take 500 Units by mouth daily.   Yes [provider]  albuterol (PROVENTIL HFA;VENTOLIN HFA) 108 (90 BASE) MCG/ACT inhaler Inhale 2 puffs into the lungs every 6 (six) hours as needed for wheezing.    [provider]  LORazepam (ATIVAN) 0.5 MG tablet Take by mouth. 06/20/18   [provider]  magnesium oxide (MAG-OX) 400 MG tablet Take 400 mg by mouth daily.    [provider]  Family History  Problem Relation Age of Onset   Thalassemia Son    Thalassemia Paternal Aunt     Social History   Socioeconomic History   Marital status: Widowed    Spouse name: Not on file   Number of children: Not on file   Years of education: Not on file   Highest education level: Not on file  Occupational History   Not on file  Tobacco Use   Smoking status: Former    Packs/day: 1.00    Years: 30.00    Pack years: 30.00    Types: Cigarettes    Quit date: 04/30/1995    Years since quitting: 26.8   Smokeless tobacco: Never   Substance and Sexual Activity   Alcohol use: No   Drug use: No   Sexual activity: Not Currently  Other Topics Concern   Not on file  Social History Narrative   Not on file   Social Determinants of Health   Financial Resource Strain: Not on file  Food Insecurity: Not on file  Transportation Needs: Not on file  Physical Activity: Not on file  Stress: Not on file  Social Connections: Not on file     Review of Systems: A 12 point ROS discussed and pertinent positives are indicated in the HPI above.  All other systems are negative.  Review of Systems  Constitutional:  Negative for appetite change, chills, fever and unexpected weight change.  Respiratory:  Negative for cough and shortness of breath.   Cardiovascular:  Negative for chest pain and leg swelling.  Gastrointestinal:  Positive for abdominal pain. Negative for nausea and vomiting.  Neurological:  Positive for weakness and headaches. Negative for dizziness.   Vital Signs: BP 122/66 (BP Location: Left Arm)   Pulse 86   Temp 97.9 F (36.6 C)   Resp 18   Ht 5\' 3"  (1.6 m)   Wt 176 lb 5.9 oz (80 kg)   SpO2 94%   BMI 31.24 kg/m   Physical Exam Vitals reviewed.  Constitutional:      General: She is not in acute distress.    Appearance: She is ill-appearing.  HENT:     Head: Normocephalic and atraumatic.     Mouth/Throat:     Mouth: Mucous membranes are dry.     Pharynx: Oropharynx is clear.  Eyes:     Extraocular Movements: Extraocular movements intact.     Pupils: Pupils are equal, round, and reactive to light.  Cardiovascular:     Rate and Rhythm: Normal rate and regular rhythm.     Pulses: Normal pulses.     Heart sounds: Normal heart sounds.  Pulmonary:     Effort: Pulmonary effort is normal. No respiratory distress.     Breath sounds: Rhonchi present.     Comments: Rhonchi to RUL Abdominal:     General: Bowel sounds are normal.     Palpations: Abdomen is soft.  Musculoskeletal:     Right lower leg:  No edema.     Left lower leg: No edema.  Skin:    General: Skin is warm and dry.  Neurological:     Mental Status: She is alert and oriented to person, place, and time.  Psychiatric:        Mood and Affect: Mood normal.        Behavior: Behavior normal.        Thought Content: Thought content normal.        Judgment: Judgment normal.  Imaging: CT Angio Chest Pulmonary Embolism (PE) W or WO Contrast  Result Date: 02/14/2022 CLINICAL DATA:  Pulmonary embolism suspected, high probability EXAM: CT ANGIOGRAPHY CHEST WITH CONTRAST TECHNIQUE: Multidetector CT imaging of the chest was performed using the standard protocol during bolus administration of intravenous contrast. Multiplanar CT image reconstructions and MIPs were obtained to evaluate the vascular anatomy. RADIATION DOSE REDUCTION: This exam was performed according to the departmental dose-optimization program which includes automated exposure control, adjustment of the mA and/or kV according to patient size and/or use of iterative reconstruction technique. CONTRAST:  50mL OMNIPAQUE IOHEXOL 350 MG/ML SOLN COMPARISON:  Abdominal CT from yesterday.  Chest CT 02/05/2014 FINDINGS: Cardiovascular: Satisfactory opacification of the pulmonary arteries to the segmental level. No evidence of pulmonary embolism. Normal heart size. No pericardial effusion. Probable left ventricular and pulmonary outflow wall thickening. Aortic and coronary atheromatous calcification. Mediastinum/Nodes: Negative for adenopathy. Small sliding hiatal hernia with lower esophageal thickening which was also seen previously. Lungs/Pleura: Chronic right middle lobe collapse with blunted vessel enhancement. Mild atelectatic densities bilaterally. There is no edema, consolidation, effusion, or pneumothorax. Upper Abdomen: Reference preceding abdominal CT; known pneumoperitoneum. Musculoskeletal: Bulky spondylosis with multilevel spurring. Review of the MIP images confirms the above  findings. IMPRESSION: Negative for pulmonary embolism. Electronically Signed   By: Tiburcio Pea M.D.   On: 02/14/2022 06:57   CT ABDOMEN PELVIS W CONTRAST  Result Date: 02/16/2022 CLINICAL DATA:  Follow-up perforated diverticulitis. Worsening abdominal pain. EXAM: CT ABDOMEN AND PELVIS WITH CONTRAST TECHNIQUE: Multidetector CT imaging of the abdomen and pelvis was performed using the standard protocol following bolus administration of intravenous contrast. RADIATION DOSE REDUCTION: This exam was performed according to the departmental dose-optimization program which includes automated exposure control, adjustment of the mA and/or kV according to patient size and/or use of iterative reconstruction technique. CONTRAST:  OMNIPAQUE IOHEXOL 300 MG/ML  SOLN COMPARISON:  02/13/2022 FINDINGS: Lower Chest: New small left pleural effusion and left lower lobe atelectasis. Hepatobiliary: No hepatic masses identified. Multiple tiny less than 5 mm gallstones are again seen. Gallbladder is distended, but there are no other findings of acute cholecystitis or biliary ductal dilatation. Pancreas:  No mass or inflammatory changes. Spleen: Within normal limits in size and appearance. Adrenals/Urinary Tract: No masses identified. No evidence of ureteral calculi or hydronephrosis. Stomach/Bowel: Free intraperitoneal air is again seen but mildly decreased since previous study. Moderate diverticulitis is again seen involving the descending colon, without significant change since prior study. A new rim enhancing fluid collection is seen in the pelvic cul-de-sac which measures 4.8 x 4.1 cm, consistent with abscess. Small hiatal hernia again noted. Vascular/Lymphatic: No pathologically enlarged lymph nodes. No acute vascular findings. Aortic atherosclerotic calcification noted. Reproductive: Prior hysterectomy noted. Adnexal regions are unremarkable in appearance. Other:  None. Musculoskeletal: No suspicious bone lesions  identified. Old L2 and L3 vertebral body compression fractures again noted. IMPRESSION: Perforated diverticulitis involving the descending colon shows no significant change since previous study. Mild decrease in free intraperitoneal air since prior exam. New 4.8 cm rim enhancing fluid collection in pelvic cul-de-sac, consistent with abscess. New small left pleural effusion and left lower lobe atelectasis. Cholelithiasis. Gallbladder is distended, which may be due to NPO status. No other findings of acute cholecystitis. Stable small hiatal hernia. Aortic Atherosclerosis (ICD10-I70.0). Electronically Signed   By: Danae Orleans M.D.   On: 02/16/2022 11:55   CT ABDOMEN PELVIS W CONTRAST  Result Date: 02/13/2022 CLINICAL DATA:  Free air under the right hemidiaphragm on recent  chest x-ray EXAM: CT ABDOMEN AND PELVIS WITH CONTRAST TECHNIQUE: Multidetector CT imaging of the abdomen and pelvis was performed using the standard protocol following bolus administration of intravenous contrast. RADIATION DOSE REDUCTION: This exam was performed according to the departmental dose-optimization program which includes automated exposure control, adjustment of the mA and/or kV according to patient size and/or use of iterative reconstruction technique. CONTRAST:  OMNIPAQUE IOHEXOL 300 MG/ML  SOLN COMPARISON:  Chest x-ray from earlier in the same day. FINDINGS: Lower chest: Lung bases show no focal infiltrate or sizable effusion. Hepatobiliary: Liver is well visualized and within normal limits. Gallbladder is well distended with multiple gallstones within. No biliary ductal dilatation is seen. Pancreas: Unremarkable. No pancreatic ductal dilatation or surrounding inflammatory changes. Spleen: Normal in size without focal abnormality. Adrenals/Urinary Tract: Adrenal glands are within normal limits. Kidneys demonstrate a normal enhancement pattern bilaterally. Subcentimeter hypodensity is noted in the right kidney inferiorly  likely representing a small cyst. No follow-up is recommended. No obstructive changes are seen. The bladder is partially distended. Stomach/Bowel: Colon shows evidence of diverticular change is well as diverticulitis at the junction of the descending and sigmoid colons. Some small foci of extraluminal air are noted adjacent to the area of diverticulitis as well as more significant air in the more superior aspect of the abdominal cavity similar to that seen on recent chest x-ray. These changes are most consistent with perforated diverticulitis. No abscess is noted at this time. More proximal colon appears within normal limits. The appendix has been surgically removed. Small bowel and stomach are within normal limits with the exception of a hiatal hernia. Vascular/Lymphatic: Aortic atherosclerosis. No enlarged abdominal or pelvic lymph nodes. Reproductive: Status post hysterectomy. No adnexal masses. Other: No abdominal wall hernia or abnormality. No abdominopelvic ascites. Musculoskeletal: Degenerative changes of lumbar spine are noted. Chronic L2 and L3 compression fractures are noted. IMPRESSION: Changes consistent with acute diverticulitis with perforation and considerable free intraperitoneal air. This corresponds with that seen on recent chest x-ray. Cholelithiasis without complicating factors. Hiatal hernia. Electronically Signed   By: Alcide Clever M.D.   On: 02/13/2022 22:11   US Venous Img Lower Bilateral (DVT)  Result Date: 02/14/2022 CLINICAL DATA:  Bilateral edema EXAM: BILATERAL LOWER EXTREMITY VENOUS DOPPLER ULTRASOUND TECHNIQUE: Gray-scale sonography with compression, as well as color and duplex ultrasound, were performed to evaluate the deep venous system(s) from the level of the common femoral vein through the popliteal and proximal calf veins. COMPARISON:  03/26/2002 by report only FINDINGS: VENOUS Normal compressibility of the common femoral, superficial femoral, and popliteal veins, as well as  the visualized calf veins. Visualized portions of profunda femoral vein and great saphenous vein unremarkable. No filling defects to suggest DVT on grayscale or color Doppler imaging. Doppler waveforms show normal direction of venous flow, normal respiratory plasticity and response to augmentation. OTHER None. Limitations: none IMPRESSION: Negative. Electronically Signed   By: Corlis Leak M.D.   On: 02/14/2022 08:23   DG Chest Portable 1 View  Result Date: 02/13/2022 CLINICAL DATA:  Hypoxia EXAM: PORTABLE CHEST 1 VIEW COMPARISON:  02/05/2014 FINDINGS: Cardiac shadow is within normal limits. Aortic calcifications are noted. The lungs are well aerated bilaterally. No focal infiltrate or sizable effusion is seen. There is air beneath the right hemidiaphragm on this frontal film. Postsurgical changes are noted in the cervical spine. Degenerative change of thoracic spine is noted. IMPRESSION: Free air beneath the right hemidiaphragm. CT of the abdomen and pelvis is recommended for further evaluation.  Critical Value/emergent results were called by telephone at the time of interpretation on 02/13/2022 at 8:25 pm to [facility] , who verbally acknowledged these results. Electronically Signed   By: Alcide Clever M.D.   On: 02/13/2022 20:28    Labs:  CBC: Recent Labs    02/13/22 2045 02/14/22 0523 02/15/22 0455 02/16/22 0528  WBC 22.4* 19.3* 13.8* 9.3  HGB 12.4 10.8* 10.2* 10.1*  HCT 39.3 34.5* 32.1* 32.3*  PLT 279 232 178 219    COAGS: No results for input(s): INR, APTT in the last 8760 hours.  BMP: Recent Labs    02/14/22 1028 02/14/22 1321 02/15/22 0951 02/16/22 0528  NA 135 135 134* 134*  K 4.0 3.8 3.7 3.3*  CL 102 100 100 100  CO2 28 26 28 27   GLUCOSE 95 99 90 121*  BUN 16 15 12 11   CALCIUM 8.5* 8.6* 8.6* 8.4*  CREATININE 0.77 0.78 0.68 0.69  GFRNONAA >60 >60 >60 >60    LIVER FUNCTION TESTS: Recent Labs    02/13/22 2045 02/14/22 0523  BILITOT 1.2 1.3*  AST 29 20  ALT  18 16  ALKPHOS 79 66  PROT 7.1 6.3*  ALBUMIN 3.9 3.5    TUMOR MARKERS: No results for input(s): AFPTM, CEA, CA199, CHROMGRNA in the last 8760 hours.  Assessment and Plan: History of asthma, anemia, melanoma, GERD, HA, PVD, SOB and UTI. Pt presented to ED 02/13/22 c/o LLQ pain x 3 days and diarrhea. Pt had CT abd/pelvis that showed pelvic abscess. Pt was referred by Denna Haggard, PA to IR for pelvic fluid abscess drain. Procedure was approved by Dr. Juliette Alcide. Procedure tentatively scheduled for 02/17/22.   Pt resting in bed. She is A&O, calm and pleasant.  She is in no distress.  Pt daughter at bedside. Pt request daughter to sign consent.  Orders placed for pt to be NPO at [facility].  Last Lovenox was given 5/2.   Risks and benefits pelvic abscess drain placement discussed with the patient including bleeding, infection, damage to adjacent structures, bowel perforation/fistula connection, and sepsis. All of the patient's and daughter's questions were answered, patient is agreeable to proceed.  Consent signed and in chart.   Thank you for this interesting consult.  I greatly enjoyed meeting Laura Frey and look forward to participating in their care.  A copy of this report was sent to the requesting provider on this date.  Electronically Signed: Shon Hough, NP 02/16/2022, 4:16 PM   I spent a total of 20 minutes in face to face in clinical consultation, greater than 50% of which was counseling/coordinating care for pelvic abscess.

## 2022-02-16 NOTE — Progress Notes (Signed)
?Progress Note ? ? ?Patient: Laura Frey BVQ:[id number] DOB: 05/17/1932 DOA: 02/13/2022     2 ?DOS: the patient was seen and examined on 02/16/2022 ?  ?Brief hospital course: ?Taken from prior notes. ? ?Laura Frey is a 88 y.o. female with medical history significant of CHF, COPD, Htn, and c/h back pain coming for rt sided lower back pain and left lower quadrant pain as well. The LLQ pain started after she slid off the from commode  she did not fall or have LOC or hit her head or injure herself in anyway and that was today. ?Pt also reported few days of diarrhea. ?CT abdomen concerning for perforated diverticulitis.  General surgery was consulted and they decided to proceed with conservative measures.  Repeat CT abdomen with mildly decreased pneumo peritoneum but there is a new loculated effusion concerning for abscess development. ?IR was consulted for percutaneous drain placement. ? ?Lower extremity venous Doppler and CTA was obtained for concern of shortness of breath and mild hypoxia, both studies were negative for DVT and PE respectively. ? ?Patient met sepsis criteria secondary to perforated diverticulitis. ?She was started on cefepime and Flagyl and received IV fluid. ?She is currently on clear liquid with n.p.o. after midnight for IR procedure tomorrow. ? ? ?Assessment and Plan: ?* Perforated diverticulum of intestine ?Patient continued to have abdominal pain.  Repeat CT abdomen with stable rather slightly improved hemoperitoneum but with the new development of abscess. ?IR was consulted by general surgery for percutaneous drain placement which will be done tomorrow. ?-Continue cefepime and Flagyl ?-N.p.o. after midnight for percutaneous drain placement by IR ?-Continue with clear liquid ?-Continue to monitor as if she deteriorates she will need urgent exploratory laparotomy ? ?Abdominal pain ?Secondary to diverticulitis.  Significant diffuse tenderness with mild distention. ?Surgery is on  board. ?-Continue with supportive care ? ?Severe sepsis ([place]) ?Met sepsis criteria with leukocytosis, tachycardia and secondary to start perforation with diverticulitis.  Repeat imaging with concern of abscess formation. ?-Percutaneous drain placement by IR tomorrow ?-Continue antibiotics as mentioned above ? ?Anemia ?Marland Kitchen  Hemoglobin seems stable. ?-Continue to monitor ? ?Acute respiratory failure with hypoxia ([place]) ?Improved.  Most likely secondary to sepsis.  CTA was negative for PE and lower venous Doppler was negative for DVT. ?-Continue with supportive care ?-Continue with supplemental oxygen as needed ? ? ?COPD (chronic obstructive pulmonary disease) ([place]) ?No concern of exacerbation. ?-Continue with home bronchodilators ? ?Hypertension ?Blood pressure within goal.. ?On amlodipine and metoprolol at home. ?-Continue metoprolol and amlodipine ? ?Hypothyroid ?- Continue home dose of Synthroid ? ?Chronic diastolic CHF (congestive heart failure) ([place]) ?Appears euvolemic. ? ? ?Subjective: Patient continued to feel abdominal pain.  No nausea or vomiting.  Passing flatus, no bowel movement since she came to the hospital.  She was having some diarrhea over the weekend.  Son at bedside. ? ?Physical Exam: ?Vitals:  ? 02/16/22 0518 02/16/22 0743 02/16/22 1600 02/16/22 1611  ?BP:  (!) 131/54  122/66  ?Pulse:  89 82 86  ?Resp:  18  18  ?Temp:  97.6 ?F (36.4 ?C)  97.9 ?F (36.6 ?C)  ?TempSrc:      ?SpO2: 92% 93% 90% 94%  ?Weight:      ?Height:      ? ?General.  Frail elderly lady in no acute distress. ?Pulmonary.  Lungs clear bilaterally, normal respiratory effort. ?CV.  Regular rate and rhythm, no JVD, rub or murmur. ?Abdomen.  Soft, mild diffuse tenderness, mildly distended, BS positive. ?CNS.  Alert and oriented .  No focal neurologic deficit. ?Extremities.  No edema, no cyanosis, pulses intact and symmetrical. ?Psychiatry.  Judgment and insight appears normal. ? ?Data Reviewed: ?Prior notes, labs and images  reviewed. ? ?Family Communication: Discussed with son at bedside ? ?Disposition: ?Status is: Inpatient ?Remains inpatient appropriate because: Severity of illness ? ? Planned Discharge Destination: Home ? ?DVT prophylaxis.  SCDs ?Time spent: 50 minutes ? ?This record has been created using Systems analyst. Errors have been sought and corrected,but may not always be located. Such creation errors do not reflect on the standard of care. ? ?Author: ?Lorella Nimrod, MD ?02/16/2022 5:18 PM ? ?For on call review [email or web address].  ?

## 2022-02-16 NOTE — Progress Notes (Signed)
Physical Therapy Treatment ?Patient Details ?Name: Laura Frey ?MRN: [id number] ?DOB: Aug 30, 1932 ?Today's Date: 02/16/2022 ? ? ?History of Present Illness 88 y.o. female who was admitted to [facility] with Abdominal pain, perforated diverticulum of large intestine, Acute respiratory failure, Sepsis with ARF. ? ?  ?PT Comments  ? ? Pt not initially feeling that she could do a lot but with cuing from PT and family willing to do a little.  Unfortunately she was much weaker and more limited today and despite apparent great effort she only managed to do a very slow, labored and unsteady bout of <10 ft of ambulation.  Similarly she struggled to transfer from recliner to bed, again needing constant assist, heavy cuing and showing very poor confidence and execution with the effort.   ?Recommendations for follow up therapy are one component of a multi-disciplinary discharge planning process, led by the attending physician.  Recommendations may be updated based on patient status, additional functional criteria and insurance authorization. ? ?Follow Up Recommendations ? Skilled nursing-short term rehab (<3 hours/day) ?  ?  ?Assistance Recommended at Discharge Intermittent Supervision/Assistance  ?Patient can return home with the following A little help with walking and/or transfers;A little help with bathing/dressing/bathroom;Assistance with cooking/housework;Assist for transportation ?  ?Equipment Recommendations ?    ?  ?Recommendations for Other Services   ? ? ?  ?Precautions / Restrictions Precautions ?Precautions: Fall ?Restrictions ?Weight Bearing Restrictions: No  ?  ? ?Mobility ? Bed Mobility ?Overal bed mobility: Needs Assistance ?Bed Mobility: Sit to Supine ?  ?  ?  ?Sit to supine: Mod assist, Max assist ?  ?General bed mobility comments: Pt very fatigued after minimal ambulation, heavy assist to get back to and scoot up in bed ?  ? ?Transfers ?Overall transfer level: Needs assistance ?Equipment used: Rolling walker (2  wheels) ?Transfers: Sit to/from Stand ?Sit to Stand: Min assist ?  ?  ?  ?  ?  ?General transfer comment: Pt unable to rise w/o assist from standard height recliner. Unable to even initiate upward movement needing heavy assist to continue upright motion.  After protracted minimal ambulation and seated rest break we did stand pivot to bed, which again needed heavy assist. ?  ? ?Ambulation/Gait ?Ambulation/Gait assistance: Max assist ?Gait Distance (Feet): 6 Feet ?Assistive device: Rolling walker (2 wheels) ?  ?  ?  ?  ?General Gait Details: Pt did not do nearly as well with ambulation today as on eval.  She very much struggled to get weight over the walker (retro lean most of the time needing direct assist just to stay up) and though she was able to advance the L foot relatively well she consistently could only advance the R foot and inch or 2 despite much encouragement (and apparent pt effort) to try and step at least to the R foot if not advance past it.  Pt simply could not attain any cadence, fatigued very quickly and was unsteady the entire time - On 1L t/o with sats remaining low 90s to high 80s ? ? ?Stairs ?  ?  ?  ?  ?  ? ? ?Wheelchair Mobility ?  ? ?Modified Rankin (Stroke Patients Only) ?  ? ? ?  ?Balance Overall balance assessment: Needs assistance ?Sitting-balance support: Feet unsupported ?Sitting balance-Leahy Scale: Fair ?  ?  ?Standing balance support: Reliant on assistive device for balance, Bilateral upper extremity supported, During functional activity ?Standing balance-Leahy Scale: Poor ?  ?  ?  ?  ?  ?  ?  ?  ?  ?  ?  ?  ?  ? ?  ?  Cognition Arousal/Alertness: Awake/alert ?  ?Overall Cognitive Status: Impaired/Different from baseline ?Area of Impairment: Problem solving ?  ?  ?  ?  ?  ?  ?  ?  ?  ?  ?  ?  ?  ?  ?Problem Solving: Slow processing, Decreased initiation, Difficulty sequencing, Requires verbal cues, Requires tactile cues ?  ?  ?  ? ?  ?Exercises   ? ?  ?General Comments General comments  (skin integrity, edema, etc.): vss throughout, pt endorses general fatigue from npo status ?  ?  ? ?Pertinent Vitals/Pain Pain Assessment ?Faces Pain Scale: Hurts even more ?Pain Location: L sided abdonminal pain  ? ? ?Home Living   ?  ?  ?  ?  ?  ?  ?  ?  ?  ?   ?  ?Prior Function    ?  ?  ?   ? ?PT Goals (current goals can now be found in the care plan section) Acute Rehab PT Goals ?Patient Stated Goal: go home ?Progress towards PT goals: Not progressing toward goals - comment (pt very weak and limited) ? ?  ?Frequency ? ? ? Min 2X/week ? ? ? ?  ?PT Plan Discharge plan needs to be updated  ? ? ?Co-evaluation   ?  ?  ?  ?  ? ?  ?AM-PAC PT "6 Clicks" Mobility   ?Outcome Measure ? Help needed turning from your back to your side while in a flat bed without using bedrails?: A Lot ?Help needed moving from lying on your back to sitting on the side of a flat bed without using bedrails?: A Lot ?Help needed moving to and from a bed to a chair (including a wheelchair)?: A Lot ?Help needed standing up from a chair using your arms (e.g., wheelchair or bedside chair)?: A Lot ?Help needed to walk in hospital room?: A Lot ?Help needed climbing 3-5 steps with a railing? : Total ?6 Click Score: 11 ? ?  ?End of Session Equipment Utilized During Treatment: Gait belt;Oxygen ?Activity Tolerance: Patient tolerated treatment well ?Patient left: with bed alarm set;with call bell/phone within reach;with family/visitor present ?Nurse Communication: Mobility status ?PT Visit Diagnosis: Muscle weakness (generalized) (M62.81);Difficulty in walking, not elsewhere classified (R26.2) ?  ? ? ?Time: 0034-9179 ?PT Time Calculation (min) (ACUTE ONLY): 25 min ? ?Charges:  $Gait Training: 8-22 mins ?$Therapeutic Activity: 8-22 mins          ?          ? ?Kreg Shropshire, DPT ?02/16/2022, 5:55 PM ? ?

## 2022-02-17 ENCOUNTER — Inpatient Hospital Stay: Payer: Medicare Other

## 2022-02-17 DIAGNOSIS — K5720 Diverticulitis of large intestine with perforation and abscess without bleeding: Secondary | ICD-10-CM | POA: Diagnosis not present

## 2022-02-17 LAB — BASIC METABOLIC PANEL
Anion gap: 5 (ref 5–15)
BUN: 8 mg/dL (ref 8–23)
CO2: 28 mmol/L (ref 22–32)
Calcium: 8.4 mg/dL — ABNORMAL LOW (ref 8.9–10.3)
Chloride: 104 mmol/L (ref 98–111)
Creatinine, Ser: 0.6 mg/dL (ref 0.44–1.00)
GFR, Estimated: >60 mL/min (ref >60)
Glucose, Bld: 103 mg/dL — ABNORMAL HIGH (ref 70–99)
Potassium: 3.6 mmol/L (ref 3.5–5.1)
Sodium: 137 mmol/L (ref 135–145)

## 2022-02-17 LAB — CBC
HCT: 33.6 % — ABNORMAL LOW (ref 36.0–46.0)
Hemoglobin: 10.6 g/dL — ABNORMAL LOW (ref 12.0–15.0)
MCH: 20.3 pg — ABNORMAL LOW (ref 26.0–34.0)
MCHC: 31.5 g/dL (ref 30.0–36.0)
MCV: 64.4 fL — ABNORMAL LOW (ref 80.0–100.0)
Platelets: 256 10*3/uL (ref 150–400)
RBC: 5.22 MIL/uL — ABNORMAL HIGH (ref 3.87–5.11)
RDW: 15.8 % — ABNORMAL HIGH (ref 11.5–15.5)
WBC: 7.4 10*3/uL (ref 4.0–10.5)
nRBC: 0 % (ref 0.0–0.2)

## 2022-02-17 MED ORDER — MIDAZOLAM HCL 2 MG/2ML IJ SOLN
INTRAMUSCULAR | Status: AC
  Filled 2022-02-17: qty 2

## 2022-02-17 MED ORDER — FENTANYL CITRATE (PF) 100 MCG/2ML IJ SOLN
INTRAMUSCULAR | Status: AC
Start: 2022-02-17 — End: 2022-02-17
  Filled 2022-02-17: qty 2

## 2022-02-17 MED ORDER — FENTANYL CITRATE (PF) 100 MCG/2ML IJ SOLN
INTRAMUSCULAR | Status: AC | PRN
  Administered 2022-02-17: 25 ug via INTRAVENOUS

## 2022-02-17 MED ORDER — ENOXAPARIN SODIUM 40 MG/0.4ML IJ SOSY
40.0000 mg | PREFILLED_SYRINGE | INTRAMUSCULAR | Status: DC
  Administered 2022-02-18 – 2022-02-21 (×4): 40 mg via SUBCUTANEOUS
  Filled 2022-02-17 (×4): qty 0.4

## 2022-02-17 NOTE — Assessment & Plan Note (Signed)
Improved.  Most likely secondary to sepsis.  CTA was negative for PE and lower venous Doppler was negative for DVT. ?-Continue with supportive care ?-Continue with supplemental oxygen as needed ? ?

## 2022-02-17 NOTE — Progress Notes (Signed)
Occupational Therapy Treatment ?Patient Details ?Name: Laura Frey ?MRN: [id number] ?DOB: Jul 03, 1932 ?Today's Date: 02/17/2022 ? ? ?History of present illness 88 y.o. female who was admitted to [facility] with Abdominal pain, perforated diverticulum of large intestine, Acute respiratory failure, Sepsis with ARF. ?  ?OT comments ? Chart reviewed, RN cleared pt for participation in OT tx session. Tx session targeted progressing functional mobility to facilitate return to PLOF. Supine>sit completed with MOD A wit HOB raised, STS with MOD A, attempted steps up the bed to L with MOD A approx 2 steps. Pt returned to bed sit>supine with MOD A. Pt is limited by pain but reports continued weakness affecting optimal ADL completion. Pt is left as received with daughter present, NAD, all needs met. Discharge recommendation has been upgraded to STR as pt would continue to benefit from ongoing OT to address functional deficits. OT will follow acutely.   ? ?Recommendations for follow up therapy are one component of a multi-disciplinary discharge planning process, led by the attending physician.  Recommendations may be updated based on patient status, additional functional criteria and insurance authorization. ?   ?Follow Up Recommendations ? Skilled nursing-short term rehab (<3 hours/day)  ?  ?Assistance Recommended at Discharge Frequent or constant Supervision/Assistance  ?Patient can return home with the following ? A lot of help with bathing/dressing/bathroom;Assistance with cooking/housework;Direct supervision/assist for medications management;Help with stairs or ramp for entrance;A lot of help with walking and/or transfers ?  ?Equipment Recommendations ? BSC/3in1  ?  ?Recommendations for Other Services   ? ?  ?Precautions / Restrictions Precautions ?Precautions: Fall ?Precaution Comments: jp drain ?Restrictions ?Weight Bearing Restrictions: No  ? ? ?  ? ?Mobility Bed Mobility ?Overal bed mobility: Needs Assistance ?Bed Mobility:  Supine to Sit, Sit to Supine ?  ?  ?Supine to sit: Mod assist, HOB elevated ?Sit to supine: Mod assist, HOB elevated ?  ?  ?  ? ?Transfers ?Overall transfer level: Needs assistance ?Equipment used: Rolling walker (2 wheels) ?Transfers: Sit to/from Stand ?Sit to Stand: Mod assist ?  ?  ?  ?  ?  ?General transfer comment: attempted steps up the bed to the L with MOD A with RW ?  ?  ?Balance Overall balance assessment: Needs assistance ?Sitting-balance support: Feet supported ?Sitting balance-Leahy Scale: Fair ?  ?  ?  ?  ?  ?  ?  ?  ?  ?  ?  ?  ?  ?  ?  ?  ?   ? ?ADL either performed or assessed with clinical judgement  ? ?ADL Overall ADL's : Needs assistance/impaired ?  ?  ?  ?  ?  ?  ?  ?  ?  ?  ?Lower Body Dressing: Maximal assistance;Bed level ?  ?  ?  ?  ?  ?  ?  ?  ?  ?  ? ?Extremity/Trunk Assessment   ?  ?  ?  ?  ?  ? ?Vision   ?  ?  ?Perception   ?  ?Praxis   ?  ? ?Cognition   ?  ?  ?  ?  ?  ?  ?  ?  ?  ?  ?  ?  ?  ?  ?  ?  ?  ?  ?  ?  ?  ?   ?Exercises   ? ?  ?Shoulder Instructions   ? ? ?  ?General Comments    ? ? ?Pertinent Vitals/ Pain  Pain Assessment ?Pain Assessment: Faces ?Faces Pain Scale: Hurts even more ?Pain Location: drain insertion area, buttocks ?Pain Descriptors / Indicators: Discomfort ?Pain Intervention(s): Limited activity within patient's tolerance, Monitored during session, Repositioned ? ?Home Living   ?  ?  ?  ?  ?  ?  ?  ?  ?  ?  ?  ?  ?  ?  ?  ?  ?  ?  ? ?  ?Prior Functioning/Environment    ?  ?  ?  ?   ? ?Frequency ? Min 2X/week  ? ? ? ? ?  ?Progress Toward Goals ? ?OT Goals(current goals can now be found in the care plan section) ? Progress towards OT goals: Progressing toward goals ? ?Acute Rehab OT Goals ?Patient Stated Goal: go to rehab ?OT Goal Formulation: With patient/family ?Time For Goal Achievement: 03/03/22 ?Potential to Achieve Goals: Good  ?Plan Discharge plan needs to be updated   ? ?Co-evaluation ? ? ?   ?  ?  ?  ?  ? ?  ?AM-PAC OT "6 Clicks" Daily Activity      ?Outcome Measure ? ? Help from another person eating meals?: None ?Help from another person taking care of personal grooming?: A Little ?Help from another person toileting, which includes using toliet, bedpan, or urinal?: A Lot ?Help from another person bathing (including washing, rinsing, drying)?: A Lot ?Help from another person to put on and taking off regular upper body clothing?: A Little ?Help from another person to put on and taking off regular lower body clothing?: A Lot ?6 Click Score: 16 ? ?  ?End of Session Equipment Utilized During Treatment: Gait belt;Rolling walker (2 wheels);Oxygen ? ?OT Visit Diagnosis: Unsteadiness on feet (R26.81);Muscle weakness (generalized) (M62.81) ?  ?Activity Tolerance Patient tolerated treatment well ?  ?Patient Left with call bell/phone within reach;with family/visitor present;in bed;with bed alarm set ?  ?Nurse Communication Mobility status ?  ? ?   ? ?Time: 9741-6384 ?OT Time Calculation (min): 20 min ? ?Charges: OT General Charges ?$OT Visit: 1 Visit ?OT Treatments ?$Therapeutic Activity: 8-22 mins ? ?Shanon Payor, OTD OTR/L  ?02/17/22, 3:56 PM  ?

## 2022-02-17 NOTE — Assessment & Plan Note (Signed)
Patient continued to have abdominal pain.  Repeat CT abdomen with stable rather slightly improved hemoperitoneum but with the new development of abscess. ?S/p gluteal accessed drain placement by IR, cultures sent ?-Continue cefepime and Flagyl ?-Continue with clear liquid ?-Continue to monitor as if she deteriorates she will need urgent exploratory laparotomy ?

## 2022-02-17 NOTE — Assessment & Plan Note (Signed)
.    Hemoglobin seems stable. ?-Continue to monitor ?

## 2022-02-17 NOTE — Progress Notes (Signed)
?Progress Note ? ? ?Patient: Laura Frey TXM:[id number] DOB: [birth year] DOA: 02/13/2022     3 ?DOS: the patient was seen and examined on 02/17/2022 ?  ?Brief hospital course: ?Taken from prior notes. ? ?Eshal Propps is a 88 y.o. female with medical history significant of CHF, COPD, Htn, and c/h back pain coming for rt sided lower back pain and left lower quadrant pain as well. The LLQ pain started after she slid off the from commode  she did not fall or have LOC or hit her head or injure herself in anyway and that was today. ?Pt also reported few days of diarrhea. ?CT abdomen concerning for perforated diverticulitis.  General surgery was consulted and they decided to proceed with conservative measures.  Repeat CT abdomen with mildly decreased pneumo peritoneum but there is a new loculated effusion concerning for abscess development. ?IR was consulted for percutaneous drain placement. ? ?Lower extremity venous Doppler and CTA was obtained for concern of shortness of breath and mild hypoxia, both studies were negative for DVT and PE respectively. ? ?Patient met sepsis criteria secondary to perforated diverticulitis. ?She was started on cefepime and Flagyl and received IV fluid. ?She is currently on clear liquid with n.p.o. after midnight for IR procedure tomorrow. ? ?5/4: Patient has successful gluteal region drain placed with IR, removal of about 25 cc of serous/postural fluid, cultures sent.  Patient tolerated the procedure well. ?Surgery to continue with conservative measures and clear liquid diet. ?We will continue current antibiotics-de-escalate once culture data is available. ?PT is recommending SNF, TOC started the process ? ? ?Assessment and Plan: ?* Perforated diverticulum of intestine ?Patient continued to have abdominal pain.  Repeat CT abdomen with stable rather slightly improved hemoperitoneum but with the new development of abscess. ?S/p gluteal accessed drain placement by IR, cultures  sent ?-Continue cefepime and Flagyl ?-Continue with clear liquid ?-Continue to monitor as if she deteriorates she will need urgent exploratory laparotomy ? ?Abdominal pain ?Secondary to diverticulitis.  Seems improving today. ?Surgery is on board. ?-Continue with supportive care ? ?Severe sepsis ([place]) ?Met sepsis criteria with leukocytosis, tachycardia and secondary to start perforation with diverticulitis.  Repeat imaging with concern of abscess formation. ?S/p percutaneous drain placement by IR, culture sent. ?-Follow-up abscess culture results ?-Continue antibiotics as mentioned above ? ?Anemia ?Marland Kitchen  Hemoglobin seems stable. ?-Continue to monitor ? ?Acute respiratory failure with hypoxia ([place]) ?Improved.  Most likely secondary to sepsis.  CTA was negative for PE and lower venous Doppler was negative for DVT. ?-Continue with supportive care ?-Continue with supplemental oxygen as needed ? ? ?COPD (chronic obstructive pulmonary disease) ([place]) ?No concern of exacerbation. ?-Continue with home bronchodilators ? ?Hypertension ?Blood pressure within goal.. ?On amlodipine and metoprolol at home. ?-Continue metoprolol and amlodipine ? ?Hypothyroid ?- Continue home dose of Synthroid ? ?Chronic diastolic CHF (congestive heart failure) ([place]) ?Appears euvolemic. ? ? ?Subjective: Patient was seen and examined after percutaneous drain placement.  Seems little lethargic and drowsy after getting fentanyl.  Denies any pain.  Daughter at bedside ? ?Physical Exam: ?Vitals:  ? 02/17/22 1212 02/17/22 1216 02/17/22 1226 02/17/22 1236  ?BP: (!) 162/66 (!) 161/67 (!) 156/90 (!) 148/84  ?Pulse: 94 93 92 90  ?Resp: _0 ?Temp:    (!) 97.5 ?F (36.4 ?C)  ?TempSrc:    Oral  ?SpO2: 95% 92% 94% 92%  ?Weight:      ?Height:      ? ?General.  Lethargic elderly lady,  in no acute distress. ?Pulmonary.  Lungs clear bilaterally, normal respiratory effort. ?CV.  Regular rate and rhythm, no JVD, rub or murmur. ?Abdomen.  Soft, nontender,  nondistended, BS positive. ?CNS.  Somnolent.  No focal neurologic deficit. ?Extremities.  No edema, no cyanosis, pulses intact and symmetrical. ?Psychiatry.  Judgment and insight appears normal. ? ?Data Reviewed: ?Prior notes, labs and images reviewed ? ?Family Communication: Discussed with daughter at bedside ? ?Disposition: ?Status is: Inpatient ?Remains inpatient appropriate because: Severity of illness ? ? Planned Discharge Destination: Skilled nursing facility ? ?DVT prophylaxis.  Lovenox ?Time spent: 45 minutes ? ?This record has been created using Systems analyst. Errors have been sought and corrected,but may not always be located. Such creation errors do not reflect on the standard of care. ? ?Author: ?Lorella Nimrod, MD ?02/17/2022 3:03 PM ? ?For on call review [email or web address].  ?

## 2022-02-17 NOTE — Progress Notes (Signed)
[facility] ?SURGICAL PROGRESS NOTE (cpt [id number]) ? ?Hospital Day(s): 3.  ? ?Interval History: Patient seen and examined, no acute events or new complaints overnight. Patient continues to endorse back and leg pain immediately upon initiating interview. When asked about her abdomen she states "I don't know, I haven't pushed on it." No fever, chills, emesis. She remains without leukocytosis; 7.4K. Hgb remains stable at 10.6. Renal function remains normal; sCr - 0.60; UO - 1600 ccs.  No electrolyte derangements. She is NPO this morning. She is on cefepime and flagyl.  ? ?Review of Systems:  ?Constitutional: denies fever, chills  ?HEENT: denies cough or congestion  ?Respiratory: denies any shortness of breath  ?Cardiovascular: denies chest pain or palpitations  ?Gastrointestinal: + abdominal pain (improved), denied N/V ?Genitourinary: denies burning with urination or urinary frequency ?Musculoskeletal: + back pain, denies decreased motor or sensation ?  ? ?Vital signs in last 24 hours: [min-max] current  ?Temp:  [97.2 ?F (36.2 ?C)-97.9 ?F (36.6 ?C)] 97.2 ?F (36.2 ?C) (05/04 8588) ?Pulse Rate:  [81-89] 81 (05/04 0437) ?Resp:  [16-18] 18 (05/04 0437) ?BP: (122-135)/(54-66) 125/58 (05/04 0437) ?SpO2:  [90 %-95 %] 95 % (05/04 0437) ?Weight:  [79.5 kg] 79.5 kg (05/04 0440)     Height: '5\' 3"'$  (160 cm) Weight: 79.5 kg BMI (Calculated): 31.05  ? ?Intake/Output last 2 shifts:  ?05/03 0701 - 05/04 0700 ?In: 1 [P.O.:780] ?Out: 1600 [Urine:1600]  ? ?Physical Exam:  ?Constitutional: alert, cooperative and no distress  ?HENT: normocephalic without obvious abnormality  ?Eyes: PERRL, EOM's grossly intact and symmetric  ?Respiratory: breathing non-labored at rest  ?Cardiovascular: regular rate and sinus rhythm  ?Gastrointestinal: Soft, LLQ abdominal tenderness is improving today, certainly less tender, non-distended, no rebound/guarding. She is not overtly peritonitic ?Musculoskeletal: UE and LE FROM, no edema or wounds,  motor and sensation grossly intact, NT  ?  ? ? ?Labs:  ? ?  Latest Ref Rng & Units 02/17/2022  ?  5:35 AM 02/16/2022  ?  5:28 AM 02/15/2022  ?  4:55 AM  ?CBC  ?WBC 4.0 - 10.5 K/uL 7.4   9.3   13.8    ?Hemoglobin 12.0 - 15.0 g/dL 10.6   10.1   10.2    ?Hematocrit 36.0 - 46.0 % 33.6   32.3   32.1    ?Platelets 150 - 400 K/uL 256   219   178    ? ? ?  Latest Ref Rng & Units 02/17/2022  ?  5:35 AM 02/16/2022  ?  5:28 AM 02/15/2022  ?  9:51 AM  ?CMP  ?Glucose 70 - 99 mg/dL 103   121   90    ?BUN 8 - 23 mg/dL '8   11   12    '$ ?Creatinine 0.44 - 1.00 mg/dL 0.60   0.69   0.68    ?Sodium 135 - 145 mmol/L 137   134   134    ?Potassium 3.5 - 5.1 mmol/L 3.6   3.3   3.7    ?Chloride 98 - 111 mmol/L 104   100   100    ?CO2 22 - 32 mmol/L '28   27   28    '$ ?Calcium 8.9 - 10.3 mg/dL 8.4   8.4   8.6    ? ? ? ?Imaging studies: No new pertinent imaging studies ? ? ?Assessment/Plan: (ICD-10's: K57.20) ?88 y.o. female with persistent abdominal pain but resolved leukocytosis found to have perforated diverticulitis without abscess or peritonitis ?  ? -  Plan for percutaneous drainage with IR today; Greatly appreciate their assistance  ?        - NPO this morning for IR procedure; Okay to resume CLD after completion ? - Continue IVF support  ?- No emergent surgical interventions. Will attempt conservative measures as outlined below. She understands that should she fail to improve or clinically deteriorate, we would proceed with urgent intervention and likely temporizing colostomy.       ?- IV Abx (Cefepime / Flagyl); Day 4 ?- Monitor abdominal examination ?- Pain control prn; antiemetics prn ?          - Further management per primary service; we will follow  ?  ?All of the above findings and recommendations were discussed with the patient, patient's family (sone at bedside this morning), and the medical team, and all of patient's and family's questions were answered to their expressed satisfaction. ? ?-- ?Edison Simon, PA-C ?[facility] ?02/17/2022, 7:16 AM ?M-F: 7am - 4pm ? ?

## 2022-02-17 NOTE — Progress Notes (Signed)
Mobility Specialist - Progress Note ? ? 02/17/22 1500  ?Mobility  ?Activity Refused mobility  ? ? ? ?Pt declined mobility, pt just finished OT session and requests to rest at this time. Will attempt another date/time.  ? ? ?Kathee Delton ?Mobility Specialist ?02/17/22, 3:29 PM ? ? ? ? ?

## 2022-02-17 NOTE — Assessment & Plan Note (Signed)
Met sepsis criteria with leukocytosis, tachycardia and secondary to start perforation with diverticulitis.  Repeat imaging with concern of abscess formation. ?S/p percutaneous drain placement by IR, culture sent. ?-Follow-up abscess culture results ?-Continue antibiotics as mentioned above ?

## 2022-02-17 NOTE — Care Management Important Message (Signed)
Important Message ? ?Patient Details  ?Name: Laura Frey ?MRN: [id number] ?Date of Birth: May 14, 1932 ? ? ?Medicare Important Message Given:  Yes ? ? ? ? ?Dannette Barbara ?02/17/2022, 12:34 PM ?

## 2022-02-17 NOTE — Assessment & Plan Note (Signed)
Secondary to diverticulitis.  Seems improving today. ?Surgery is on board. ?-Continue with supportive care ?

## 2022-02-17 NOTE — TOC Progression Note (Signed)
Transition of Care (TOC) - Progression Note  ? ? ?Patient Details  ?Name: Laura Frey ?MRN: [id number] ?Date of Birth: Nov 27, 1931 ? ?Transition of Care (TOC) CM/SW Contact  ?Candie Chroman, LCSW ?Phone Number: ?02/17/2022, 1:16 PM ? ?Clinical Narrative:  PT has changed recommendation to SNF. Met with patient and daughter at bedside and confirmed they are agreeable. Preference is [facility]. Sent referral and left admissions coordinator a message asking her to review.  ? ?Expected Discharge Plan: [place] ?Barriers to Discharge: Continued Medical Work up ? ?Expected Discharge Plan and Services ?Expected Discharge Plan: [place] ?  ?  ?Post Acute Care Choice: Home Health ?Living arrangements for the past 2 months: [place] ?                ?  ?  ?  ?  ?  ?HH Arranged: OT, PT, Nurse's Aide, RN ?[place] Agency: [facility] ([place]) ?Date HH Agency Contacted: 02/16/22 ?  ?Representative spoke with at [place]: Floydene Flock ? ? ?Social Determinants of Health (SDOH) Interventions ?  ? ?Readmission Risk Interventions ?   ? View : No data to display.  ?  ?  ?  ? ? ?

## 2022-02-17 NOTE — NC FL2 (Signed)
?[place] MEDICAID FL2 LEVEL OF CARE SCREENING TOOL  ?  ? ?IDENTIFICATION  ?Patient Name: ?Laura Frey Birthdate: 26-Mar-1932 Sex: female Admission Date (Current Location): ?02/13/2022  ?[place] and [place] Number: ? [place] ?  Facility and Address:  ?[facility], [address], [place], [place] [address] ?     Provider Number: ?[id number]  ?Attending Physician Name and Address:  ?Lorella Nimrod, MD ? Relative Name and Phone Number:  ?  ?   ?Current Level of Care: ?Hospital Recommended Level of Care: ?[place] Prior Approval Number: ?  ? ?Date Approved/Denied: ?  PASRR Number: ?[id number] A ? ?Discharge Plan: ?SNF ?  ? ?Current Diagnoses: ?Patient Active Problem List  ? Diagnosis Date Noted  ? Perforated diverticulum of intestine 02/14/2022  ? CHF (congestive heart failure) ([place]) 02/14/2022  ? Chronic diastolic CHF (congestive heart failure) ([place]) 02/14/2022  ? Abdominal pain 02/14/2022  ? Acute respiratory failure with hypoxia ([place]) 02/14/2022  ? Closed compression fracture of L3 lumbar vertebra, initial encounter ([place]) 02/16/2021  ? Chronic low back pain (1ry area of Pain) (Bilateral) (R>L) w/o sciatica 01/07/2021  ? Spondylosis without myelopathy or radiculopathy, lumbar region 08/09/2018  ? Osteopenia of lumbar spine 08/06/2018  ? Complaints of weakness of lower extremity 07/17/2018  ? Lower extremity weakness 04/04/2018  ? Lower extremity numbness 04/04/2018  ? Disorder of skeletal system 01/01/2018  ? Pharmacologic therapy 01/01/2018  ? Problems influencing health status 01/01/2018  ? Cervicalgia 01/01/2018  ? Chronic upper back pain 01/01/2018  ? Lower extremity weakness (Bilateral) 01/01/2018  ? Frequent falls 12/11/2017  ? Gait instability 12/11/2017  ? COPD (chronic obstructive pulmonary disease) ([place]) 11/16/2017  ? Thalassemia 11/16/2017  ? Hypertension 11/16/2017  ? Hypothyroid 11/16/2017  ? Abnormal MRI, lumbar spine (2018) 11/16/2017  ? L2 compression  fracture 11/16/2017  ? H/O cervical spine surgery (C4-C7 ACDF) 11/16/2017  ? Abnormal MRI, cervical spine (2014) 11/16/2017  ? Lumbar facet joint osteoarthritis (Bilateral) 11/16/2017  ? Lumbar facet hypertrophy (Bilateral) 11/16/2017  ? Lumbar facet syndrome (Bilateral) 11/16/2017  ? Osteoarthritis of lumbar spine 11/16/2017  ? Lumbar spondylosis 11/16/2017  ? Cervical spondylosis 11/16/2017  ? Cervical facet arthropathy (Bilateral) 11/16/2017  ? Chronic pain syndrome 11/16/2017  ? Osteoarthritis 11/16/2017  ? Lumbar Grade 1 Anterolisthesis of L3/L4 11/16/2017  ? Lumbosacral Grade 1 Retrolisthesis of L5/S1 11/16/2017  ? Lumbar lateral recess stenosis (Bilateral) 11/16/2017  ? Chronic lower extremity pain (2ry area of Pain) (Bilateral) (R>L) 11/16/2017  ? Chronic lower extremity radicular pain (Bilateral) (R>L) 11/16/2017  ? Chronic lumbar radiculitis (Right) 11/16/2017  ? DDD (degenerative disc disease), lumbar 11/16/2017  ? DDD (degenerative disc disease), cervical 11/16/2017  ? Hypoxia 02/27/2014  ? Anemia, unspecified 02/27/2014  ? Urinary tract infection 02/27/2014  ? Diastolic CHF, acute ([place]) 02/08/2014  ? Acute diastolic heart failure ([place]) 02/08/2014  ? Shortness of breath 02/05/2014  ? HTN (hypertension) 02/05/2014  ? Anemia 02/05/2014  ? SOB (shortness of breath) 02/05/2014  ? Nonspecific (abnormal) findings on radiological and other examination of gastrointestinal tract 01/31/2014  ? Severe sepsis ([place]) 01/27/2014  ? Hypotension, unspecified 01/27/2014  ? UTI (urinary tract infection) 01/27/2014  ? Unspecified hypothyroidism 01/27/2014  ? GERD (gastroesophageal reflux disease) 01/27/2014  ? Esophageal reflux 01/27/2014  ? Increased frequency of urination 02/12/2013  ? Other chronic cystitis without hematuria 02/12/2013  ? Recurrent UTI 02/12/2013  ? Urge incontinence 02/12/2013  ? ? ?Orientation RESPIRATION BLADDER Height & Weight   ?  ?Self, Time,  Situation, Place ? O2 (Nasal Cannula 2 L) Incontinent,  External catheter Weight: 175 lb 4.3 oz (79.5 kg) ?Height:  '5\' 3"'$  (160 cm)  ?BEHAVIORAL SYMPTOMS/MOOD NEUROLOGICAL BOWEL NUTRITION STATUS  ? (None)  (None) Continent Diet (Currently on clear liquids. Advancing as tolerated.)  ?AMBULATORY STATUS COMMUNICATION OF NEEDS Skin   ?Extensive Assist Verbally Normal ?  ?  ?  ?    ?     ?     ? ? ?Personal Care Assistance Level of Assistance  ?Bathing, Feeding, Dressing Bathing Assistance: Maximum assistance ?Feeding assistance: Limited assistance ?Dressing Assistance: Maximum assistance ?   ? ?Functional Limitations Info  ?Sight, Hearing, Speech Sight Info: Adequate ?Hearing Info: Adequate ?Speech Info: Adequate  ? ? ?SPECIAL CARE FACTORS FREQUENCY  ?PT (By licensed PT), OT (By licensed OT)   ?  ?PT Frequency: 5 x week ?OT Frequency: 5 x week ?  ?  ?  ?   ? ? ?Contractures Contractures Info: Not present  ? ? ?Additional Factors Info  ?Code Status, Allergies Code Status Info: Full code ?Allergies Info: Ciprofloxacin, Hydrocodone, Oxycodone, Zoloft (Sertraline Hcl), Sertraline, Sulfa Antibiotics, Tylenol (Acetaminophen), Ultram (Tramadol) ?  ?  ?  ?   ? ?Current Medications (02/17/2022):  This is the current hospital active medication list ?Current Facility-Administered Medications  ?Medication Dose Route Frequency Provider Last Rate Last Admin  ? albuterol (PROVENTIL) (2.5 MG/3ML) 0.083% nebulizer solution 3 mL  3 mL Inhalation Q6H PRN Wouk, Ailene Rud, MD      ? ceFEPIme (MAXIPIME) 2 g in sodium chloride 0.9 % 100 mL IVPB  2 g Intravenous Q12H Florina Ou V, MD 200 mL/hr at 02/17/22 0928 2 g at 02/17/22 6237  ? darifenacin (ENABLEX) 24 hr tablet 7.5 mg  7.5 mg Oral Daily Florina Ou V, MD   7.5 mg at 02/17/22 6283  ? dextrose 5 %-0.9 % sodium chloride infusion   Intravenous Continuous Lorella Nimrod, MD 50 mL/hr at 02/16/22 2126 New Bag at 02/16/22 2126  ? DULoxetine (CYMBALTA) DR capsule 60 mg  60 mg Oral Daily Para Skeans, MD   60 mg at 02/17/22 0816  ? fentaNYL  (SUBLIMAZE) 100 MCG/2ML injection           ? levothyroxine (SYNTHROID) tablet 75 mcg  75 mcg Oral Q0600 Para Skeans, MD   75 mcg at 02/17/22 0641  ? loratadine (CLARITIN) tablet 10 mg  10 mg Oral Daily Gwynne Edinger, MD   10 mg at 02/17/22 1517  ? MEDLINE mouth rinse  15 mL Mouth Rinse BID Para Skeans, MD   15 mL at 02/17/22 0816  ? menthol-cetylpyridinium (CEPACOL) lozenge 3 mg  1 lozenge Oral PRN Gwynne Edinger, MD   3 mg at 02/15/22 0406  ? metoprolol succinate (TOPROL-XL) 24 hr tablet 200 mg  200 mg Oral Daily Para Skeans, MD   200 mg at 02/17/22 0816  ? metroNIDAZOLE (FLAGYL) IVPB 500 mg  500 mg Intravenous Q12H Florina Ou V, MD 100 mL/hr at 02/17/22 1257 500 mg at 02/17/22 1257  ? morphine (PF) 2 MG/ML injection 2 mg  2 mg Intravenous Q4H PRN Para Skeans, MD   2 mg at 02/17/22 0932  ? pantoprazole (PROTONIX) EC tablet 40 mg  40 mg Oral Daily Gwynne Edinger, MD   40 mg at 02/17/22 6160  ? sodium chloride flush (NS) 0.9 % injection 3 mL  3 mL Intravenous Q12H Para Skeans, MD   3  mL at 02/17/22 0817  ? ? ? ?Discharge Medications: ?Please see discharge summary for a list of discharge medications. ? ?Relevant Imaging Results: ? ?Relevant Lab Results: ? ? ?Additional Information ?SS#: [id number]. Percutaneous drain placed today (5/4). ? ?Candie Chroman, LCSW ? ? ? ? ?

## 2022-02-17 NOTE — Procedures (Signed)
Interventional Radiology Procedure Note ? ?Date of Procedure: 02/17/2022  ?Procedure: CT drain placement  ? ?Findings:  ?1. CT 12 Fr drain placement, right pelvic collection via right transgluteal approach   ? ?Complications: No immediate complications noted.  ? ?Estimated Blood Loss: minimal ? ?Follow-up and Recommendations: ?1. Per surgery  ? ? ?Albin Felling, MD  ?Vascular & Interventional Radiology  ?02/17/2022 1:08 PM ? ? ? ?

## 2022-02-18 DIAGNOSIS — K5720 Diverticulitis of large intestine with perforation and abscess without bleeding: Secondary | ICD-10-CM | POA: Diagnosis not present

## 2022-02-18 LAB — CBC
HCT: 33 % — ABNORMAL LOW (ref 36.0–46.0)
Hemoglobin: 10.1 g/dL — ABNORMAL LOW (ref 12.0–15.0)
MCH: 19.8 pg — ABNORMAL LOW (ref 26.0–34.0)
MCHC: 30.6 g/dL (ref 30.0–36.0)
MCV: 64.7 fL — ABNORMAL LOW (ref 80.0–100.0)
Platelets: 270 10*3/uL (ref 150–400)
RBC: 5.1 MIL/uL (ref 3.87–5.11)
RDW: 15.7 % — ABNORMAL HIGH (ref 11.5–15.5)
WBC: 6.5 10*3/uL (ref 4.0–10.5)
nRBC: 0 % (ref 0.0–0.2)

## 2022-02-18 LAB — BASIC METABOLIC PANEL
Anion gap: 7 (ref 5–15)
BUN: 7 mg/dL — ABNORMAL LOW (ref 8–23)
CO2: 28 mmol/L (ref 22–32)
Calcium: 8.5 mg/dL — ABNORMAL LOW (ref 8.9–10.3)
Chloride: 104 mmol/L (ref 98–111)
Creatinine, Ser: 0.56 mg/dL (ref 0.44–1.00)
GFR, Estimated: >60 mL/min (ref >60)
Glucose, Bld: 102 mg/dL — ABNORMAL HIGH (ref 70–99)
Potassium: 3.5 mmol/L (ref 3.5–5.1)
Sodium: 139 mmol/L (ref 135–145)

## 2022-02-18 LAB — CULTURE, BLOOD (ROUTINE X 2)
Culture: NO GROWTH
Culture: NO GROWTH

## 2022-02-18 MED ORDER — MAGNESIUM HYDROXIDE 400 MG/5ML PO SUSP
30.0000 mL | Freq: Every day | ORAL | Status: DC
  Administered 2022-02-18 – 2022-02-20 (×3): 30 mL via ORAL
  Filled 2022-02-18 (×3): qty 30

## 2022-02-18 MED ORDER — BISACODYL 10 MG RE SUPP: 10.0000 mg | Freq: Every day | RECTAL | Status: DC | PRN

## 2022-02-18 NOTE — Assessment & Plan Note (Signed)
.    Hemoglobin seems stable. ?-Continue to monitor ?

## 2022-02-18 NOTE — Progress Notes (Signed)
?Progress Note ? ? ?Patient: Laura Frey RJJ:[id number] DOB: 1932-03-12 DOA: 02/13/2022     4 ?DOS: the patient was seen and examined on 02/18/2022 ?  ?Brief hospital course: ?Taken from prior notes. ? ?Laura Frey is a 88 y.o. female with medical history significant of CHF, COPD, Htn, and c/h back pain coming for rt sided lower back pain and left lower quadrant pain as well. The LLQ pain started after she slid off the from commode  she did not fall or have LOC or hit her head or injure herself in anyway and that was today. ?Pt also reported few days of diarrhea. ?CT abdomen concerning for perforated diverticulitis.  General surgery was consulted and they decided to proceed with conservative measures.  Repeat CT abdomen with mildly decreased pneumo peritoneum but there is a new loculated effusion concerning for abscess development. ?IR was consulted for percutaneous drain placement. ? ?Lower extremity venous Doppler and CTA was obtained for concern of shortness of breath and mild hypoxia, both studies were negative for DVT and PE respectively. ? ?Patient met sepsis criteria secondary to perforated diverticulitis. ?She was started on cefepime and Flagyl and received IV fluid. ?She is currently on clear liquid with n.p.o. after midnight for IR procedure tomorrow. ? ?5/4: Patient has successful gluteal region drain placed with IR, removal of about 25 cc of serous/postural fluid, cultures sent.  Patient tolerated the procedure well. ?Surgery to continue with conservative measures and clear liquid diet. ?We will continue current antibiotics-de-escalate once culture data is available. ?PT is recommending SNF, TOC started the process. ? ?5/5: Patient remained stable with minimum serous discharge and drain.  No bowel movement yet but passing flatus.  Diet advanced to full liquid. ?Surgery would like to continue conservative management and might add some bowel regimen tomorrow if able to tolerate full liquid  diet. ? ? ?Assessment and Plan: ?* Perforated diverticulum of intestine ?Patient continued to have abdominal pain.  Repeat CT abdomen with stable rather slightly improved hemoperitoneum but with the new development of abscess. ?S/p gluteal accessed drain placement by IR, cultures sent ?-Continue cefepime and Flagyl-day 5 ?-Diet advance to full liquid ?-Continue to monitor as if she deteriorates she will need urgent exploratory laparotomy ? ?Abdominal pain ?Secondary to diverticulitis.  Seems improving today. ?Surgery is on board. ?-Continue with supportive care ? ?Severe sepsis ([place]) ?Met sepsis criteria with leukocytosis, tachycardia and secondary to start perforation with diverticulitis.  Repeat imaging with concern of abscess formation. ?S/p percutaneous drain placement by IR, culture sent. ?-Follow-up abscess culture results ?-Continue antibiotics as mentioned above ? ?Anemia ?Mar[place] Kitchen  Hemoglobin seems stable. ?-Continue to monitor ? ?Acute respiratory failure with hypoxia ([place]) ?Improved.  Most likely secondary to sepsis.  CTA was negative for PE and lower venous Doppler was negative for DVT. ?-Continue with supportive care ?-Continue with supplemental oxygen as needed ? ? ?COPD (chronic obstructive pulmonary disease) ([place]) ?No concern of exacerbation. ?-Continue with home bronchodilators ? ?Hypertension ?Blood pressure within goal.. ?On amlodipine and metoprolol at home. ?-Continue metoprolol and amlodipine ? ?Hypothyroid ?- Continue home dose of Synthroid ? ?Chronic diastolic CHF (congestive heart failure) ([place]) ?Appears euvolemic. ? ? ?Subjective: Patient was feeling much improved and happy with the full liquid diet.  Denies any nausea or vomiting.  Passing flatus, no bowel movement yet.  Son at bedside ? ?Physical Exam: ?Vitals:  ? 02/18/22 0440 02/18/22 0541 02/18/22 0722 02/18/22 1523  ?BP: (!) 128/59  (!) 134/54 (!) 132/58  ?Pulse: 81  82 81  ?Resp: '18  18 16  ' ?Temp: 97.9 ?F (36.6 ?C)  97.8 ?F (36.6 ?C) 98.4  ?F (36.9 ?C)  ?TempSrc: Oral   Oral  ?SpO2: 91%  98% 94%  ?Weight:  82.7 kg    ?Height:      ? ?General.  Pleasant elderly lady, in no acute distress. ?Pulmonary.  Lungs clear bilaterally, normal respiratory effort. ?CV.  Regular rate and rhythm, no JVD, rub or murmur. ?Abdomen.  Soft, nontender, nondistended, BS positive. ?CNS.  Alert and oriented .  No focal neurologic deficit. ?Extremities.  No edema, no cyanosis, pulses intact and symmetrical. ?Psychiatry.  Judgment and insight appears normal. ? ?Data Reviewed: ?Prior notes and labs reviewed ? ?Family Communication: Discussed with son at bedside ? ?Disposition: ?Status is: Inpatient ?Remains inpatient appropriate because: Severity of illness ? ? Planned Discharge Destination: Skilled nursing facility ? ?DVT prophylaxis.  Lovenox ?Time spent: 45 minutes ? ?This record has been created using Systems analyst. Errors have been sought and corrected,but may not always be located. Such creation errors do not reflect on the standard of care. ? ?Author: ?Laura Nimrod, MD ?02/18/2022 3:31 PM ? ?For on call review [email or web address].  ?

## 2022-02-18 NOTE — Assessment & Plan Note (Signed)
Secondary to diverticulitis.  Seems improving today. ?Surgery is on board. ?-Continue with supportive care ?

## 2022-02-18 NOTE — Assessment & Plan Note (Signed)
Patient continued to have abdominal pain.  Repeat CT abdomen with stable rather slightly improved hemoperitoneum but with the new development of abscess. ?S/p gluteal accessed drain placement by IR, cultures sent ?-Continue cefepime and Flagyl-day 5 ?-Diet advance to full liquid ?-Continue to monitor as if she deteriorates she will need urgent exploratory laparotomy ?

## 2022-02-18 NOTE — Progress Notes (Signed)
PT Cancellation Note ? ?Patient Details ?Name: Sopheap Basic ?MRN: [id number] ?DOB: December 09, 1931 ? ? ?Cancelled Treatment:     PT attempt. Pt has out of town visitor who just arrived and family requesting to hold PT until a later time. Acute PT will continue to follow and progress as able per pt tolerance. Continued recommendation for DC to rehab once medically cleared.  ? ? ?Willette Pa ?02/18/2022, 4:22 PM ?

## 2022-02-18 NOTE — Plan of Care (Signed)

## 2022-02-18 NOTE — Progress Notes (Signed)
[facility] ?SURGICAL PROGRESS NOTE (cpt [phone]) ? ?Hospital Day(s): 4.  ? ?Interval History: Patient seen and examined, no acute events or new complaints overnight. Patient continues to endorse back and leg pain immediately upon initiating interview. When asked about her abdomen she states "I don't know, I haven't pushed on it." No fever, chills, emesis. She remains without leukocytosis; 6.5 K. Hgb remains stable at 10.1. Renal function remains normal; sCr - 0.56; UO - 1250 ccs.  No electrolyte derangements.  She has tolerated clear liquids well, denies bowel movement though reports flatus.  She is on cefepime and flagyl.  ?Still complains of her drain placement procedure. ? ?Review of Systems:  ?Constitutional: denies fever, chills  ?HEENT: denies cough or congestion  ?Respiratory: denies any shortness of breath  ?Cardiovascular: denies chest pain or palpitations  ?Gastrointestinal: + abdominal pain (improved), denied N/V ?Genitourinary: denies burning with urination or urinary frequency ?Musculoskeletal: + back pain, denies decreased motor or sensation ?  ? ?Vital signs in last 24 hours: [min-max] current  ?Temp:  [97.5 ?F (36.4 ?C)-98 ?F (36.7 ?C)] 97.8 ?F (36.6 ?C) (05/05 6387) ?Pulse Rate:  [79-94] 82 (05/05 0722) ?Resp:  [16-20] 18 (05/05 5643) ?BP: (126-162)/(54-90) 134/54 (05/05 3295) ?SpO2:  [91 %-99 %] 98 % (05/05 0722) ?Weight:  [82.7 kg] 82.7 kg (05/05 0541)     Height: '5\' 3"'$  (160 cm) Weight: 82.7 kg BMI (Calculated): 32.3  ? ?Intake/Output last 2 shifts:  ?05/04 0701 - 05/05 0700 ?In: 840 [P.O.:840] ?Out: 1260 [Urine:1250; Drains:10]  ? ?Physical Exam:  ?Constitutional: alert, cooperative and no distress.  Nontoxic. ?HENT: normocephalic without obvious abnormality  ?Eyes: PERRL, EOM's grossly intact and symmetric  ?Respiratory: breathing non-labored at rest  ?Cardiovascular: regular rate and sinus rhythm  ?Gastrointestinal: Soft, LLQ abdominal tenderness is diminishing yet more today,  non-distended, no rebound/guarding. She has no involuntary guarding. ?Musculoskeletal: UE and LE FROM, no edema or wounds, motor and sensation grossly intact, NT  ?  ? ? ?Labs:  ? ?  Latest Ref Rng & Units 02/18/2022  ?  4:54 AM 02/17/2022  ?  5:35 AM 02/16/2022  ?  5:28 AM  ?CBC  ?WBC 4.0 - 10.5 K/uL 6.5   7.4   9.3    ?Hemoglobin 12.0 - 15.0 g/dL 10.1   10.6   10.1    ?Hematocrit 36.0 - 46.0 % 33.0   33.6   32.3    ?Platelets 150 - 400 K/uL 270   256   219    ? ? ?  Latest Ref Rng & Units 02/18/2022  ?  4:54 AM 02/17/2022  ?  5:35 AM 02/16/2022  ?  5:28 AM  ?CMP  ?Glucose 70 - 99 mg/dL 102   103   121    ?BUN 8 - 23 mg/dL '7   8   11    '$ ?Creatinine 0.44 - 1.00 mg/dL 0.56   0.60   0.69    ?Sodium 135 - 145 mmol/L 139   137   134    ?Potassium 3.5 - 5.1 mmol/L 3.5   3.6   3.3    ?Chloride 98 - 111 mmol/L 104   104   100    ?CO2 22 - 32 mmol/L '28   28   27    '$ ?Calcium 8.9 - 10.3 mg/dL 8.5   8.4   8.4    ? ? ? ?Imaging studies: No new pertinent imaging studies ? ? ?Assessment/Plan: (ICD-10's: K57.20) ?88 y.o. female  with persistent abdominal pain but resolved leukocytosis found to have perforated diverticulitis without abscess or peritonitis ?  ? -Drain output currently is minimal and serous. ?        -Full liquid diet today, may advance diet as tolerated. ?- No emergent surgical interventions. Will attempt conservative measures as outlined below. She understands that should she fail to improve or clinically deteriorate, we would proceed with urgent intervention and likely temporizing colostomy.       ?- IV Abx (Cefepime / Flagyl); Day 5 ?- Monitor abdominal examination ?- Pain control prn; antiemetics prn ?          - Further management per primary service; we will follow  ? -May need to consider initiation of gentle bowel regimen to restore bowel function.  We will see how she does with a full liquid diet with resuming bowel activity. ?  ?All of the above findings and recommendations were discussed with the patient, patient's  family (sone at bedside this morning), and the medical team, and all of patient's and family's questions were answered to their expressed satisfaction. ? ?-- ?Ronny Bacon, M.D., FACS ?[facility] ? ?02/18/2022 ; 12:22 PM ? ? ?

## 2022-02-18 NOTE — TOC Progression Note (Addendum)
Transition of Care (TOC) - Progression Note  ? ? ?Patient Details  ?Name: Laura Frey ?MRN: [id number] ?Date of Birth: 1931-12-21 ? ?Transition of Care (TOC) CM/SW Contact  ?Candie Chroman, LCSW ?Phone Number: ?02/18/2022, 9:51 AM ? ?Clinical Narrative: [facility] has offered a bed. Patient and son are aware.   ? ?3:38 pm: Per MD, patient will likely discharge Monday. [facility] admissions coordinator is aware. Uploaded clinicals into [facility] portal to start insurance authorization. ? ?Expected Discharge Plan: [place] ?Barriers to Discharge: Continued Medical Work up ? ?Expected Discharge Plan and Services ?Expected Discharge Plan: [place] ?  ?  ?Post Acute Care Choice: Home Health ?Living arrangements for the past 2 months: [place] ?                ?  ?  ?  ?  ?  ?HH Arranged: OT, PT, Nurse's Aide, RN ?[place] Agency: [facility] ?Date HH Agency Contacted: 02/16/22 ?  ?Representative spoke with at [place]: Floydene Flock ? ? ?Social Determinants of Health (SDOH) Interventions ?  ? ?Readmission Risk Interventions ?   ? View : No data to display.  ?  ?  ?  ? ? ?

## 2022-02-19 DIAGNOSIS — K5792 Diverticulitis of intestine, part unspecified, without perforation or abscess without bleeding: Secondary | ICD-10-CM

## 2022-02-19 LAB — BASIC METABOLIC PANEL
Anion gap: 6 (ref 5–15)
BUN: 5 mg/dL — ABNORMAL LOW (ref 8–23)
CO2: 30 mmol/L (ref 22–32)
Calcium: 8.6 mg/dL — ABNORMAL LOW (ref 8.9–10.3)
Chloride: 102 mmol/L (ref 98–111)
Creatinine, Ser: 0.55 mg/dL (ref 0.44–1.00)
GFR, Estimated: >60 mL/min (ref >60)
Glucose, Bld: 107 mg/dL — ABNORMAL HIGH (ref 70–99)
Potassium: 3.5 mmol/L (ref 3.5–5.1)
Sodium: 138 mmol/L (ref 135–145)

## 2022-02-19 NOTE — Progress Notes (Signed)
CC: Diverticulitis ?Subjective: ?No major complaints, feeling well. ?Discussed with son.  He is concerned about morphine. ?Currently she is allergic to pretty much every other narcotic other than morphine ?Currently at the discussion with her regarding judicious use of narcotics given her advanced age ? ?Objective: ?Vital signs in last 24 hours: ?Temp:  [97.6 ?F (36.4 ?C)-98.4 ?F (36.9 ?C)] 97.6 ?F (36.4 ?C) (05/06 1007) ?Pulse Rate:  [74-86] 74 (05/06 0748) ?Resp:  [16-20] 18 (05/06 0748) ?BP: (132-151)/(58-71) 135/59 (05/06 0748) ?SpO2:  [94 %-99 %] 98 % (05/06 0748) ?Weight:  [82.6 kg] 82.6 kg (05/06 0500) ?Last BM Date : 02/13/22 (last documented) ? ?Intake/Output from previous day: ?05/05 0701 - 05/06 0700 ?In: 500.3 [I.V.:3; IV Piggyback:497.3] ?Out: 2015 [Urine:2000; Drains:15] ?Intake/Output this shift: ?Total I/O ?In: 480 [P.O.:480] ?Out: -  ? ?Physical exam: ?Constitutional: alert, cooperative and no distress  ?HENT: normocephalic without obvious abnormality  ?Eyes: , EOM's grossly intact and symmetric  ?Respiratory: breathing non-labored at rest  ?Cardiovascular: regular rate and sinus rhythm  ?Gastrointestinal: Soft, non tender  non-distended, no rebound/guarding. She is not  peritonitic. Drain w serous output. ? Musculoskeletal: UE and LE FROM, no edema or wounds, motor and sensation grossly intact, NT  ? ?Lab Results: ?CBC  ?Recent Labs  ?  02/17/22 ?1219 02/18/22 ?0454  ?WBC 7.4 6.5  ?HGB 10.6* 10.1*  ?HCT 33.6* 33.0*  ?PLT 256 270  ? ?BMET ?Recent Labs  ?  02/18/22 ?7588 02/19/22 ?0629  ?NA 139 138  ?K 3.5 3.5  ?CL 104 102  ?CO2 28 30  ?GLUCOSE 102* 107*  ?BUN 7* 5*  ?CREATININE 0.56 0.55  ?CALCIUM 8.5* 8.6*  ? ?PT/INR ?No results for input(s): LABPROT, INR in the last 72 hours. ?ABG ?No results for input(s): PHART, HCO3 in the last 72 hours. ? ?Invalid input(s): PCO2, PO2 ? ?Studies/Results: ?No results found. ? ?Anti-infectives: ?Anti-infectives (From admission, onward)  ? ? Start     Dose/Rate  Route Frequency Ordered Stop  ? 02/14/22 1200  metroNIDAZOLE (FLAGYL) IVPB 500 mg       ? 500 mg ?100 mL/hr over 60 Minutes Intravenous Every 12 hours 02/14/22 0048    ? 02/13/22 2245  metroNIDAZOLE (FLAGYL) IVPB 500 mg       ? 500 mg ?100 mL/hr over 60 Minutes Intravenous  Once 02/13/22 2235 02/14/22 0038  ? 02/13/22 2245  ceFEPIme (MAXIPIME) 2 g in sodium chloride 0.9 % 100 mL IVPB       ? 2 g ?200 mL/hr over 30 Minutes Intravenous Every 12 hours 02/13/22 2241    ? ?  ? ? ?Assessment/Plan: ?Diverticulitis with abscess in an elderly and fragile female. ?She continues to improve clinically without evidence of peritonitis or complications. ?She will continue antibiotic therapy. ?No need for surgical intervention ?We will be happy to see her as an outpatient. ?Encouraged increased mobility and physical therapy. ?Recommend 7 days of outpatient antibiotic therapy may use Augmentin. ?We will be available ?I spent  35 minutes in this encounter including coordination of her care, counseling , placing orders and performing appropriate documentation ? ?Caroleen Hamman, MD, FACS ? ?02/19/2022 ? ? ? ?  ?

## 2022-02-19 NOTE — Progress Notes (Signed)
Physical Therapy Treatment ?Patient Details ?Name: Laura Frey ?MRN: [id number] ?DOB: 1932-06-26 ?Today's Date: 02/19/2022 ? ? ?History of Present Illness 88 y.o. female who was admitted to [facility] with Abdominal pain, perforated diverticulum of large intestine, Acute respiratory failure, Sepsis with ARF. ? ?  ?PT Comments  ? ? Physical therapy session completed this date. Patient continues to be very limited by pain and fatigue, decreased progress in therapy session. Patient did not state a numerical number for pain, however facial expressions reported at least a 6/10. Patient continues to require Mod A with all bed mobility. Requiring significant cueing for hand placement and assistance at BLEs and through HHA to pull into sitting. Once in sitting, patient was unable to remain in an upright posture without Min A through HHA. Sit to stand continues to require Min A with significant increase in bed height. Once in standing patient had an unannounced urine/liquid BM. Attempted to provide sig cueing to patient to ambulate towards the bathroom, however patient was unable to step a step forward(only slide LLE up ~.5inches). Patient tolerated ~5 minutes in standing with Min A for Max A clean up from NT, however patient fatigued and clean up had to be resumed in supine. Max A required for rolling bilaterally. Patient was left in bed with NT/family present. Patient would continue to benefit from skilled physical therapy in order to optimize patient's return to PLOF. Continue to recommend STR upon discharge from acute hospitalization.  ?  ?Recommendations for follow up therapy are one component of a multi-disciplinary discharge planning process, led by the attending physician.  Recommendations may be updated based on patient status, additional functional criteria and insurance authorization. ? ?Follow Up Recommendations ? Skilled nursing-short term rehab (<3 hours/day) ?  ?  ?Assistance Recommended at Discharge Intermittent  Supervision/Assistance  ?Patient can return home with the following A little help with walking and/or transfers;A little help with bathing/dressing/bathroom;Assistance with cooking/housework;Assist for transportation ?  ?Equipment Recommendations ? None recommended by PT  ?  ?Recommendations for Other Services   ? ? ?  ?Precautions / Restrictions Precautions ?Precautions: Fall ?Precaution Comments: jp drain ?Restrictions ?Weight Bearing Restrictions: No  ?  ? ?Mobility ? Bed Mobility ?Overal bed mobility: Needs Assistance ?Bed Mobility: Supine to Sit, Sit to Supine ?  ?  ?Supine to sit: Mod assist, HOB elevated ?Sit to supine: Mod assist, HOB elevated ?  ?General bed mobility comments: heavy assist to get back into bed, MAX A +2 to reposition ?  ? ?Transfers ?Overall transfer level: Needs assistance ?Equipment used: Rolling walker (2 wheels) ?Transfers: Sit to/from Stand ?Sit to Stand: Mod assist, From elevated surface ?  ?  ?  ?  ?  ?General transfer comment: attempted steps forward however patient was able to slide L foot forward more than ~half an inch with significant cueing, upon standing patient had unannounced urinary/bowl movement ?  ? ?Ambulation/Gait ?  ?  ?  ?  ?  ?  ?  ?General Gait Details: patient unable to ambulate forward from EOB despite max cueing, significant posterior lean at the hips, Min A to remain in standing ? ? ?Stairs ?  ?  ?  ?  ?  ? ? ?Wheelchair Mobility ?  ? ?Modified Rankin (Stroke Patients Only) ?  ? ? ?  ?Balance Overall balance assessment: Needs assistance ?Sitting-balance support: Feet supported ?Sitting balance-Leahy Scale: Poor ?Sitting balance - Comments: Min A through HHA to remain in upright posture without posterior lean ?Postural control: Posterior  lean ?Standing balance support: Reliant on assistive device for balance, Bilateral upper extremity supported, During functional activity ?Standing balance-Leahy Scale: Poor ?Standing balance comment: Retropulsion. Pt. is able to  right self to midline with cues, and increased time. ?  ?  ?  ?  ?  ?  ?  ?  ?  ?  ?  ?  ? ?  ?Cognition Arousal/Alertness: Awake/alert ?Behavior During Therapy: [facility] for tasks assessed/performed ?Overall Cognitive Status: Impaired/Different from baseline ?Area of Impairment: Problem solving ?  ?  ?  ?  ?  ?  ?  ?  ?  ?  ?  ?  ?  ?  ?Problem Solving: Slow processing, Decreased initiation, Difficulty sequencing, Requires verbal cues, Requires tactile cues ?  ?  ?  ? ?  ?Exercises   ? ?  ?General Comments   ?  ?  ? ?Pertinent Vitals/Pain Pain Assessment ?Pain Assessment: No/denies pain ?Faces Pain Scale: Hurts even more ?Pain Location: drain insertion area, buttocks ?Pain Descriptors / Indicators: Discomfort ?Pain Intervention(s): Monitored during session, Limited activity within patient's tolerance, Repositioned  ? ? ?Home Living   ?  ?  ?  ?  ?  ?  ?  ?  ?  ?   ?  ?Prior Function    ?  ?  ?   ? ?PT Goals (current goals can now be found in the care plan section) Acute Rehab PT Goals ?Patient Stated Goal: go home ?PT Goal Formulation: With patient/family ?Time For Goal Achievement: 03/01/22 ?Potential to Achieve Goals: Fair ?Progress towards PT goals: Not progressing toward goals - comment (patient is very weak and limited) ? ?  ?Frequency ? ? ? Min 2X/week ? ? ? ?  ?PT Plan Current plan remains appropriate  ? ? ?Co-evaluation   ?  ?  ?  ?  ? ?  ?AM-PAC PT "6 Clicks" Mobility   ?Outcome Measure ? Help needed turning from your back to your side while in a flat bed without using bedrails?: A Lot ?Help needed moving from lying on your back to sitting on the side of a flat bed without using bedrails?: A Lot ?Help needed moving to and from a bed to a chair (including a wheelchair)?: A Lot ?Help needed standing up from a chair using your arms (e.g., wheelchair or bedside chair)?: A Lot ?Help needed to walk in hospital room?: A Lot ?Help needed climbing 3-5 steps with a railing? : Total ?6 Click Score: 11 ? ?  ?End of Session  Equipment Utilized During Treatment: Gait belt;Oxygen ?Activity Tolerance: Patient tolerated treatment well ?Patient left: with bed alarm set;with call bell/phone within reach;with family/visitor present ?Nurse Communication: Mobility status ?PT Visit Diagnosis: Muscle weakness (generalized) (M62.81);Difficulty in walking, not elsewhere classified (R26.2) ?  ? ? ?Time: 4503-8882 ?PT Time Calculation (min) (ACUTE ONLY): 30 min ? ?Charges:  $Therapeutic Activity: 8-22 mins ?$Self Care/Home Management: 8-22          ?          ? ?Iva Boop, PT  ?02/19/22. 11:17 AM ? ? ?

## 2022-02-19 NOTE — Assessment & Plan Note (Signed)
Patient continued to have abdominal pain.  Repeat CT abdomen with stable rather slightly improved hemoperitoneum but with the new development of abscess. ?S/p gluteal accessed drain placement by IR, cultures sent ?-Continue cefepime and Flagyl-day 6 ?-Diet advance to soft ?-Continue to monitor as if she deteriorates she will need urgent exploratory laparotomy ?

## 2022-02-19 NOTE — Progress Notes (Signed)
?Progress Note ? ? ?Patient: Laura Frey JEH:[id number] DOB: 1932-04-24 DOA: 02/13/2022     5 ?DOS: the patient was seen and examined on 02/19/2022 ?  ?Brief hospital course: ?Taken from prior notes. ? ?Laura Frey is a 88 y.o. female with medical history significant of CHF, COPD, Htn, and c/h back pain coming for rt sided lower back pain and left lower quadrant pain as well. The LLQ pain started after she slid off the from commode  she did not fall or have LOC or hit her head or injure herself in anyway and that was today. ?Pt also reported few days of diarrhea. ?CT abdomen concerning for perforated diverticulitis.  General surgery was consulted and they decided to proceed with conservative measures.  Repeat CT abdomen with mildly decreased pneumo peritoneum but there is a new loculated effusion concerning for abscess development. ?IR was consulted for percutaneous drain placement. ? ?Lower extremity venous Doppler and CTA was obtained for concern of shortness of breath and mild hypoxia, both studies were negative for DVT and PE respectively. ? ?Patient met sepsis criteria secondary to perforated diverticulitis. ?She was started on cefepime and Flagyl and received IV fluid. ?She is currently on clear liquid with n.p.o. after midnight for IR procedure tomorrow. ? ?5/4: Patient has successful gluteal region drain placed with IR, removal of about 25 cc of serous/postural fluid, cultures sent.  Patient tolerated the procedure well. ?Surgery to continue with conservative measures and clear liquid diet. ?We will continue current antibiotics-de-escalate once culture data is available. ?PT is recommending SNF, TOC started the process. ? ?5/5: Patient remained stable with minimum serous discharge and drain.  No bowel movement yet but passing flatus.  Diet advanced to full liquid. ?Surgery would like to continue conservative management and might add some bowel regimen tomorrow if able to tolerate full liquid  diet. ? ?5/6: Remains stable.  Diet advanced to soft.  Surgery is not recommending completion of 7 days of outpatient antibiotic with Augmentin.  Abscess cultures remain negative. ?Patient is stable for discharge and will follow-up with general surgery as an out. ?Waiting for SNF ? ? ?Assessment and Plan: ?* Perforated diverticulum of intestine ?Patient continued to have abdominal pain.  Repeat CT abdomen with stable rather slightly improved hemoperitoneum but with the new development of abscess. ?S/p gluteal accessed drain placement by IR, cultures sent ?-Continue cefepime and Flagyl-day 6 ?-Diet advance to soft ?-Continue to monitor as if she deteriorates she will need urgent exploratory laparotomy ? ?Abdominal pain ?Secondary to diverticulitis.  Seems improving today. ?Surgery is on board. ?-Continue with supportive care ? ?Severe sepsis ([place]) ?Met sepsis criteria with leukocytosis, tachycardia and secondary to start perforation with diverticulitis.  Repeat imaging with concern of abscess formation. ?S/p percutaneous drain placement by IR, culture sent. ?-Follow-up abscess culture results ?-Continue antibiotics as mentioned above ? ?Anemia ?[place] Kitchen  Hemoglobin seems stable. ?-Continue to monitor ? ?Acute respiratory failure with hypoxia ([place]) ?Improved.  Most likely secondary to sepsis.  CTA was negative for PE and lower venous Doppler was negative for DVT. ?-Continue with supportive care ?-Continue with supplemental oxygen as needed ? ? ?COPD (chronic obstructive pulmonary disease) ([place]) ?No concern of exacerbation. ?-Continue with home bronchodilators ? ?Hypertension ?Blood pressure within goal.. ?On amlodipine and metoprolol at home. ?-Continue metoprolol and amlodipine ? ?Hypothyroid ?- Continue home dose of Synthroid ? ?Chronic diastolic CHF (congestive heart failure) ([place]) ?Appears euvolemic. ? ? ?Subjective: Patient was seen and examined today.  Had a bowel movement  this morning.  Denies any nausea or vomiting.   Appetite improving. ? ?Physical Exam: ?Vitals:  ? 02/19/22 0500 02/19/22 0529 02/19/22 0748 02/19/22 1643  ?BP:  (!) 145/67 (!) 135/59 (!) 151/69  ?Pulse:  79 74 83  ?Resp:  _0 ?Temp:  98.1 ?F (36.7 ?C) 97.6 ?F (36.4 ?C) 98 ?F (36.7 ?C)  ?TempSrc:  Oral Oral Oral  ?SpO2:  99% 98% 98%  ?Weight: 82.6 kg     ?Height:      ? ?General.     In no acute distress. ?Pulmonary.  Lungs clear bilaterally, normal respiratory effort. ?CV.  Regular rate and rhythm, no JVD, rub or murmur. ?Abdomen.  Soft, nontender, nondistended, BS positive. ?CNS.  Alert and oriented .  No focal neurologic deficit. ?Extremities.  No edema, no cyanosis, pulses intact and symmetrical. ?Psychiatry.  Judgment and insight appears normal. ? ?Data Reviewed: ?Prior notes and labs reviewed ? ?Family Communication: Discussed with son at bedside ? ?Disposition: ?Status is: Inpatient ?Remains inpatient appropriate because: Severity of illness ? ? Planned Discharge Destination: Skilled nursing facility ? ?DVT prophylaxis.  Lovenox ?Time spent: 40 minutes ? ?This record has been created using Systems analyst. Errors have been sought and corrected,but may not always be located. Such creation errors do not reflect on the standard of care. ? ?Author: ?Lorella Nimrod, MD ?02/19/2022 4:58 PM ? ?For on call review [email or web address].  ?

## 2022-02-19 NOTE — Plan of Care (Signed)

## 2022-02-20 LAB — BASIC METABOLIC PANEL
Anion gap: 5 (ref 5–15)
BUN: <5 mg/dL — ABNORMAL LOW (ref 8–23)
CO2: 30 mmol/L (ref 22–32)
Calcium: 8.3 mg/dL — ABNORMAL LOW (ref 8.9–10.3)
Chloride: 104 mmol/L (ref 98–111)
Creatinine, Ser: 0.56 mg/dL (ref 0.44–1.00)
GFR, Estimated: >60 mL/min (ref >60)
Glucose, Bld: 109 mg/dL — ABNORMAL HIGH (ref 70–99)
Potassium: 3.6 mmol/L (ref 3.5–5.1)
Sodium: 139 mmol/L (ref 135–145)

## 2022-02-20 MED ORDER — SODIUM CHLORIDE 0.9% FLUSH
5.0000 mL | Freq: Three times a day (TID) | INTRAVENOUS | Status: DC
  Administered 2022-02-20 – 2022-02-21 (×5): 5 mL

## 2022-02-20 NOTE — TOC Progression Note (Addendum)
Transition of Care (TOC) - Progression Note  ? ? ?Patient Details  ?Name: Skyelar Halliday ?MRN: [id number] ?Date of Birth: 12-03-1931 ? ?Transition of Care (TOC) CM/SW Contact  ?Patrena Santalucia E Teran Knittle, LCSW ?Phone Number: ?02/20/2022, 12:06 PM ? ?Clinical Narrative:   Plan for DC to [facility]. Patient has auth in [facility] portal. Notified Tammy in Admissions at [facility] who confirmed patient can come tomorrow.  ? ? ? ?Expected Discharge Plan: [place] ?Barriers to Discharge: Continued Medical Work up ? ?Expected Discharge Plan and Services ?Expected Discharge Plan: [place] ?  ?  ?Post Acute Care Choice: Home Health ?Living arrangements for the past 2 months: [place] ?                ?  ?  ?  ?  ?  ?HH Arranged: OT, PT, Nurse's Aide, RN ?[place] Agency: [place] ([place]) ?Date HH Agency Contacted: 02/16/22 ?  ?Representative spoke with at [place]: Floydene Flock ? ? ?Social Determinants of Health (SDOH) Interventions ?  ? ?Readmission Risk Interventions ?   ? View : No data to display.  ?  ?  ?  ? ? ?

## 2022-02-20 NOTE — Plan of Care (Signed)

## 2022-02-20 NOTE — Assessment & Plan Note (Signed)
Met sepsis criteria with leukocytosis, tachycardia and secondary to start perforation with diverticulitis.  Repeat imaging with concern of abscess formation. ?S/p percutaneous drain placement by IR, culture sent. ?-Abscess culture results remain negative. ?-Continue antibiotics as mentioned above ?

## 2022-02-20 NOTE — Assessment & Plan Note (Addendum)
Patient continued to have abdominal pain.  Repeat CT abdomen with stable rather slightly improved hemoperitoneum but with the new development of abscess. ?S/p gluteal accessed drain placement by IR, cultures sent ?-Continue cefepime and Flagyl-day 7, surgery is recommending discharge on Augmentin for 1 week. ?-Diet advance to soft ?-Continue to monitor as if she deteriorates she will need urgent exploratory laparotomy ?

## 2022-02-20 NOTE — Progress Notes (Signed)
?Progress Note ? ? ?Patient: Laura Frey HMC:[id number] DOB: [birth year] DOA: 02/13/2022     6 ?DOS: the patient was seen and examined on 02/20/2022 ?  ?Brief hospital course: ?Taken from prior notes. ? ?Shanikia Kernodle is a 88 y.o. female with medical history significant of CHF, COPD, Htn, and c/h back pain coming for rt sided lower back pain and left lower quadrant pain as well. The LLQ pain started after she slid off the from commode  she did not fall or have LOC or hit her head or injure herself in anyway and that was today. ?Pt also reported few days of diarrhea. ?CT abdomen concerning for perforated diverticulitis.  General surgery was consulted and they decided to proceed with conservative measures.  Repeat CT abdomen with mildly decreased pneumo peritoneum but there is a new loculated effusion concerning for abscess development. ?IR was consulted for percutaneous drain placement. ? ?Lower extremity venous Doppler and CTA was obtained for concern of shortness of breath and mild hypoxia, both studies were negative for DVT and PE respectively. ? ?Patient met sepsis criteria secondary to perforated diverticulitis. ?She was started on cefepime and Flagyl and received IV fluid. ?She is currently on clear liquid with n.p.o. after midnight for IR procedure tomorrow. ? ?5/4: Patient has successful gluteal region drain placed with IR, removal of about 25 cc of serous/postural fluid, cultures sent.  Patient tolerated the procedure well. ?Surgery to continue with conservative measures and clear liquid diet. ?We will continue current antibiotics-de-escalate once culture data is available. ?PT is recommending SNF, TOC started the process. ? ?5/5: Patient remained stable with minimum serous discharge and drain.  No bowel movement yet but passing flatus.  Diet advanced to full liquid. ?Surgery would like to continue conservative management and might add some bowel regimen tomorrow if able to tolerate full liquid  diet. ? ?5/6: Remains stable.  Diet advanced to soft.  Surgery is not recommending completion of 7 days of outpatient antibiotic with Augmentin.  Abscess cultures remain negative. ?Patient is stable for discharge and will follow-up with general surgery as an out. ?Waiting for SNF ? ?5/7: Patient remained stable, having bowel movements.  Tolerating soft diet. ?Surgery is not recommending outpatient follow-up. ?Stable for discharge after getting insurance authorization for SNF. ? ? ?Assessment and Plan: ?* Perforated diverticulum of intestine ?Patient continued to have abdominal pain.  Repeat CT abdomen with stable rather slightly improved hemoperitoneum but with the new development of abscess. ?S/p gluteal accessed drain placement by IR, cultures sent ?-Continue cefepime and Flagyl-day 7, surgery is recommending discharge on Augmentin for 1 week. ?-Diet advance to soft ?-Continue to monitor as if she deteriorates she will need urgent exploratory laparotomy ? ?Abdominal pain ?Secondary to diverticulitis.  Seems improving today. ?Surgery is on board. ?-Continue with supportive care ? ?Severe sepsis ([place]) ?Met sepsis criteria with leukocytosis, tachycardia and secondary to start perforation with diverticulitis.  Repeat imaging with concern of abscess formation. ?S/p percutaneous drain placement by IR, culture sent. ?-Abscess culture results remain negative. ?-Continue antibiotics as mentioned above ? ?Anemia ?Marland Kitchen  Hemoglobin seems stable. ?-Continue to monitor ? ?Acute respiratory failure with hypoxia ([place]) ?Improved.  Most likely secondary to sepsis.  CTA was negative for PE and lower venous Doppler was negative for DVT. ?-Continue with supportive care ?-Continue with supplemental oxygen as needed ? ? ?COPD (chronic obstructive pulmonary disease) ([place]) ?No concern of exacerbation. ?-Continue with home bronchodilators ? ?Hypertension ?Blood pressure within goal.. ?On amlodipine and metoprolol at  home. ?-Continue  metoprolol and amlodipine ? ?Hypothyroid ?- Continue home dose of Synthroid ? ?Chronic diastolic CHF (congestive heart failure) ([place]) ?Appears euvolemic. ? ? ?Subjective: Patient was seen and examined today.  No new complaint.  Abdominal pain improved.  Having bowel movements.  Tolerating soft diet well. ? ?Physical Exam: ?Vitals:  ? 02/20/22 0402 02/20/22 0625 02/20/22 0847 02/20/22 1005  ?BP: (!) 132/53  (!) 158/57 (!) 143/63  ?Pulse: 74  79 84  ?Resp: '16  18 16  ' ?Temp: 98.3 ?F (36.8 ?C)  98.5 ?F (36.9 ?C) 98.3 ?F (36.8 ?C)  ?TempSrc:   Oral Oral  ?SpO2: 96%  98% 95%  ?Weight:  82.5 kg    ?Height:      ? ?General.     In no acute distress. ?Pulmonary.  Lungs clear bilaterally, normal respiratory effort. ?CV.  Regular rate and rhythm, no JVD, rub or murmur. ?Abdomen.  Soft, nontender, nondistended, BS positive.  Drain with serous secretions ?CNS.  Alert and oriented .  No focal neurologic deficit. ?Extremities.  No edema, no cyanosis, pulses intact and symmetrical. ?Psychiatry.  Judgment and insight appears normal. ? ?Data Reviewed: ?Prior notes and labs reviewed ? ?Family Communication: Discussed with son at bedside ? ?Disposition: ?Status is: Inpatient ?Remains inpatient appropriate because: Severity of illness, pending insurance authorization for SNF. ? ? Planned Discharge Destination: Skilled nursing facility ? ?DVT prophylaxis.  Lovenox ?Time spent: 40 minutes ? ?This record has been created using Systems analyst. Errors have been sought and corrected,but may not always be located. Such creation errors do not reflect on the standard of care. ? ?Author: ?Lorella Nimrod, MD ?02/20/2022 3:48 PM ? ?For on call review [email or web address].  ?

## 2022-02-20 NOTE — Assessment & Plan Note (Signed)
Secondary to diverticulitis.  Seems improving today. ?Surgery is on board. ?-Continue with supportive care ?

## 2022-02-21 MED ORDER — AMLODIPINE BESYLATE 5 MG PO TABS
5.0000 mg | ORAL_TABLET | Freq: Every day | ORAL | Status: DC
  Administered 2022-02-21: 5 mg via ORAL
  Filled 2022-02-21: qty 1

## 2022-02-21 MED ORDER — MAGNESIUM HYDROXIDE 400 MG/5ML PO SUSP: 30.0000 mL | Freq: Every evening | ORAL | 0 refills | Status: AC | PRN

## 2022-02-21 MED ORDER — AMOXICILLIN-POT CLAVULANATE 875-125 MG PO TABS: 1.0000 | ORAL_TABLET | Freq: Two times a day (BID) | ORAL | 0 refills | Status: DC

## 2022-02-21 NOTE — Progress Notes (Signed)
Physical Therapy Treatment ?Patient Details ?Name: Laura Frey ?MRN: [id number] ?DOB: January 19, 1932 ?Today's Date: 02/21/2022 ? ? ?History of Present Illness 88 y.o. female who was admitted to [facility] with Abdominal pain, perforated diverticulum of large intestine, Acute respiratory failure, Sepsis with ARF. ? ?  ?PT Comments  ? ? Pt in bed on entry, son at bedside. Pt agreeable to session. Focused on bed level leg exercises, then moved to sitting on EOB with emphasis on breathing and time in sitting. Son/RN in room at exit. Pt c/o pain in Right hip with bed mobility mostly. Remains very weak.    ?Recommendations for follow up therapy are one component of a multi-disciplinary discharge planning process, led by the attending physician.  Recommendations may be updated based on patient status, additional functional criteria and insurance authorization. ? ?Follow Up Recommendations ? Skilled nursing-short term rehab (<3 hours/day) ?  ?  ?Assistance Recommended at Discharge Intermittent Supervision/Assistance  ?Patient can return home with the following A little help with walking and/or transfers;A little help with bathing/dressing/bathroom;Assistance with cooking/housework;Assist for transportation ?  ?Equipment Recommendations ? None recommended by PT  ?  ?Recommendations for Other Services   ? ? ?  ?Precautions / Restrictions Precautions ?Precautions: Fall ?Precaution Comments: jp drain gluteal ?Restrictions ?Weight Bearing Restrictions: No  ?  ? ?Mobility ? Bed Mobility ?Overal bed mobility: Needs Assistance ?Bed Mobility: Supine to Sit ?  ?  ?Supine to sit: Mod assist ?  ?  ?  ?  ? ?Transfers ?  ?  ?  ?  ?  ?  ?  ?  ?  ?  ?  ? ?Ambulation/Gait ?  ?  ?  ?  ?  ?  ?  ?  ? ? ?Stairs ?  ?  ?  ?  ?  ? ? ?Wheelchair Mobility ?  ? ?Modified Rankin (Stroke Patients Only) ?  ? ? ?  ?Balance   ?  ?  ?  ?  ?  ?  ?  ?  ?  ?  ?  ?  ?  ?  ?  ?  ?  ?  ?  ? ?  ?Cognition Arousal/Alertness: Awake/alert ?Behavior During Therapy: [name] for  tasks assessed/performed ?Overall Cognitive Status: Within Functional Limits for tasks assessed ?  ?  ?  ?  ?  ?  ?  ?  ?  ?  ?  ?  ?  ?  ?  ?  ?  ?  ?  ? ?  ?Exercises General Exercises - Lower Extremity ?Short Arc Quad: AROM, Both, 10 reps, Supine ?Heel Slides: AAROM, Both, 15 reps, Supine ?Hip ABduction/ADduction: AAROM, AROM, Both, 15 reps, Supine ? ?  ?General Comments   ?  ?  ? ?Pertinent Vitals/Pain Pain Assessment ?Pain Assessment: Faces ?Faces Pain Scale: Hurts little more ?Pain Location: right hip pain ?Pain Intervention(s): Limited activity within patient's tolerance, Monitored during session, Premedicated before session  ? ? ?Home Living   ?  ?  ?  ?  ?  ?  ?  ?  ?  ?   ?  ?Prior Function    ?  ?  ?   ? ?PT Goals (current goals can now be found in the care plan section) Acute Rehab PT Goals ?Patient Stated Goal: go home ?PT Goal Formulation: With patient/family ?Time For Goal Achievement: 03/01/22 ?Potential to Achieve Goals: Fair ?Progress towards PT goals: Progressing toward goals ? ?  ?Frequency ? ? ?  Min 2X/week ? ? ? ?  ?PT Plan Current plan remains appropriate  ? ? ?Co-evaluation   ?  ?  ?  ?  ? ?  ?AM-PAC PT "6 Clicks" Mobility   ?Outcome Measure ? Help needed turning from your back to your side while in a flat bed without using bedrails?: A Lot ?Help needed moving from lying on your back to sitting on the side of a flat bed without using bedrails?: A Lot ?Help needed moving to and from a bed to a chair (including a wheelchair)?: Total ?Help needed standing up from a chair using your arms (e.g., wheelchair or bedside chair)?: Total ?Help needed to walk in hospital room?: Total ?Help needed climbing 3-5 steps with a railing? : Total ?6 Click Score: 8 ? ?  ?End of Session Equipment Utilized During Treatment: Gait belt;Oxygen ?Activity Tolerance: Patient tolerated treatment well ?Patient left: with bed alarm set;with call bell/phone within reach;with family/visitor present;with nursing/sitter in  room ?Nurse Communication: Mobility status ?PT Visit Diagnosis: Muscle weakness (generalized) (M62.81);Difficulty in walking, not elsewhere classified (R26.2) ?  ? ? ?Time: 6644-0347 ?PT Time Calculation (min) (ACUTE ONLY): 30 min ? ?Charges:  $Therapeutic Exercise: 23-37 mins          ?          ?12:39 PM, 02/21/22 ?Etta Grandchild, PT, DPT ?Physical Therapist - [place] ?[facility]  ?[phone] (ASCOM)  ? ? ?Jasneet Schobert C ?02/21/2022, 12:38 PM ? ?

## 2022-02-21 NOTE — Discharge Summary (Signed)
?Physician Discharge Summary ?  ?Patient: Laura Frey MRN: [id number] DOB: 1931/12/09  ?Admit date:     02/13/2022  ?Discharge date: 02/21/22  ?Discharge Physician: Lorella Nimrod  ? ?PCP: Marinda Elk, MD  ? ?Recommendations at discharge:  ?Advance diet slowly as tolerated, currently tolerating soft diet well. ?Continue with bowel regimen and titrate for 1-2 soft bowel movements daily. ?Patient is being discharged with colonic drain, placed in right gluteal region, she needs to follow-up with drain clinic and outpatient general surgery for further recommendations. ?Patient is will need 2 L of oxygen during sleep and at night. ?Avoid opioids as they were causing some hypoxia. ?Patient is also being discharged on 5 days of Augmentin. ?Follow-up with general surgery ?Follow-up with primary care provider ? ?Discharge Diagnoses: ?Principal Problem: ?  Perforated diverticulum of intestine ?Active Problems: ?  Abdominal pain ?  Severe sepsis ([place]) ?  Anemia ?  Acute respiratory failure with hypoxia ([place]) ?  COPD (chronic obstructive pulmonary disease) ([place]) ?  Hypertension ?  Hypothyroid ?  Chronic diastolic CHF (congestive heart failure) ([place]) ? ?Resolved Problems: ?  Chronic low back pain (1ry area of Pain) (Bilateral) (R>L) w/ sciatica (Bilateral) ?  Acute diverticulitis ? ?Hospital Course: ?Taken from prior notes. ? ?Laura Frey is a 88 y.o. female with medical history significant of CHF, COPD, Htn, and c/h back pain coming for rt sided lower back pain and left lower quadrant pain as well. The LLQ pain started after she slid off the from commode  she did not fall or have LOC or hit her head or injure herself in anyway and that was today. ?Pt also reported few days of diarrhea. ?CT abdomen concerning for perforated diverticulitis.  General surgery was consulted and they decided to proceed with conservative measures.  Repeat CT abdomen with mildly decreased pneumo peritoneum but there is a new loculated  effusion concerning for abscess development. ?IR was consulted for percutaneous drain placement. ? ?Lower extremity venous Doppler and CTA was obtained for concern of shortness of breath and mild hypoxia, both studies were negative for DVT and PE respectively. ? ?Patient met sepsis criteria secondary to perforated diverticulitis. ?She was started on cefepime and Flagyl and received IV fluid. ?She is currently on clear liquid with n.p.o. after midnight for IR procedure tomorrow. ? ?5/4: Patient has successful gluteal region drain placed with IR, removal of about 25 cc of serous/postural fluid, cultures sent.  Patient tolerated the procedure well. ?Surgery to continue with conservative measures and clear liquid diet. ?We will continue current antibiotics-de-escalate once culture data is available. ?PT is recommending SNF, TOC started the process. ? ?5/5: Patient remained stable with minimum serous discharge and drain.  No bowel movement yet but passing flatus.  Diet advanced to full liquid. ?Surgery would like to continue conservative management and might add some bowel regimen tomorrow if able to tolerate full liquid diet. ? ?5/6: Remains stable.  Diet advanced to soft.  Surgery is not recommending completion of 7 days of outpatient antibiotic with Augmentin.  Abscess cultures remain negative. ?Patient is stable for discharge and will follow-up with general surgery as an out. ?Waiting for SNF ? ?5/7: Patient remained stable, having bowel movements.  Tolerating soft diet. ?Surgery is not recommending outpatient follow-up. ?Stable for discharge after getting insurance authorization for SNF. ? ?5/8: Patient remained stable.  She is being discharged to rehab for further management. ?Patient will take Augmentin for 5 more days as recommended by general surgery and  follow-up with them as an outpatient. ?Patient is also being discharged with drain and will follow-up in drain clinic, most likely continue to have drain for  next 2 to 3 weeks. ? ?She will continue the rest of her home medications and follow-up with her providers. ? ?Assessment and Plan: ?* Perforated diverticulum of intestine ?Patient continued to have abdominal pain.  Repeat CT abdomen with stable rather slightly improved hemoperitoneum but with the new development of abscess. ?S/p gluteal accessed drain placement by IR, cultures sent ?-Continue cefepime and Flagyl-day 7, surgery is recommending discharge on Augmentin for 1 week. ?-Diet advance to soft ?-Continue to monitor as if she deteriorates she will need urgent exploratory laparotomy ? ?Abdominal pain ?Secondary to diverticulitis.  Seems improving today. ?Surgery is on board. ?-Continue with supportive care ? ?Severe sepsis ([place]) ?Met sepsis criteria with leukocytosis, tachycardia and secondary to start perforation with diverticulitis.  Repeat imaging with concern of abscess formation. ?S/p percutaneous drain placement by IR, culture sent. ?-Abscess culture results remain negative. ?-Continue antibiotics as mentioned above ? ?Anemia ?Marland Kitchen  Hemoglobin seems stable. ?-Continue to monitor ? ?Acute respiratory failure with hypoxia ([place]) ?Improved.  Most likely secondary to sepsis.  CTA was negative for PE and lower venous Doppler was negative for DVT. ?-Continue with supportive care ?-Continue with supplemental oxygen as needed ? ? ?COPD (chronic obstructive pulmonary disease) ([place]) ?No concern of exacerbation. ?-Continue with home bronchodilators ? ?Hypertension ?Blood pressure within goal.. ?On amlodipine and metoprolol at home. ?-Continue metoprolol and amlodipine ? ?Hypothyroid ?- Continue home dose of Synthroid ? ?Chronic diastolic CHF (congestive heart failure) ([place]) ?Appears euvolemic. ? ? ?Consultants: General surgery, IR ?Procedures performed: Percutaneous drain placement for colonic abscess ?Disposition: Skilled nursing facility ?Diet recommendation:  ?Discharge Diet Orders (From admission, onward)  ? ?  Start      Ordered  ? 02/21/22 0000  Diet - low sodium heart healthy       ? 02/21/22 1155  ? ?  ?  ? ?  ? ?Cardiac diet ?DISCHARGE MEDICATION: ?Allergies as of 02/21/2022   ? ?   Reactions  ? Ciprofloxacin Swelling, Rash  ? Hydrocodone   ? hallucinations  ? Oxycodone   ? hallucinations  ? Zoloft [sertraline Hcl]   ? Burns stomach  ? Sertraline Other (See Comments)  ? "Burns stomach"  ? Sulfa Antibiotics   ? Blisters in mouth  ? Tylenol [acetaminophen]   ? headaches  ? Ultram [tramadol]   ? headaches  ? ?  ? ?  ?Medication List  ?  ? ?STOP taking these medications   ? ?LORazepam 0.5 MG tablet ?Commonly known as: ATIVAN ?  ? ?  ? ?TAKE these medications   ? ?albuterol 108 (90 Base) MCG/ACT inhaler ?Commonly known as: VENTOLIN HFA ?Inhale 2 puffs into the lungs every 6 (six) hours as needed for wheezing. ?  ?amLODipine 5 MG tablet ?Commonly known as: NORVASC ?Take 2 tablets (10 mg total) by mouth daily. ?  ?amoxicillin-clavulanate 875-125 MG tablet ?Commonly known as: AUGMENTIN ?Take 1 tablet by mouth 2 (two) times daily for 5 days. ?  ?Aspirin-Caffeine 1000-65 MG Pack ?Take 1 packet by mouth 2 (two) times daily. ?  ?Cholecalciferol 10 MCG (400 UNIT) Caps ?Take by mouth. Take by mouth daily. ?  ?DULoxetine 60 MG capsule ?Commonly known as: CYMBALTA ?  ?fexofenadine 180 MG tablet ?Commonly known as: ALLEGRA ?Take 180 mg by mouth daily. ?  ?levothyroxine 75 MCG tablet ?Commonly known as: SYNTHROID ?Take 75  mcg by mouth daily before breakfast. ?  ?magnesium hydroxide 400 MG/5ML suspension ?Commonly known as: MILK OF MAGNESIA ?Take 30 mLs by mouth at bedtime as needed for mild constipation. ?  ?magnesium oxide 400 MG tablet ?Commonly known as: MAG-OX ?Take 400 mg by mouth daily. ?  ?metoprolol 200 MG 24 hr tablet ?Commonly known as: TOPROL-XL ?Take 200 mg by mouth daily. ?  ?multivitamin with minerals Tabs tablet ?Take 1 tablet by mouth daily. ?  ?omeprazole 20 MG capsule ?Commonly known as: PRILOSEC ?Take 20 mg by mouth daily. ?   ?VITAMIN E PO ?Take 500 Units by mouth daily. ?  ? ?  ? ?  ?  ? ? ?  ?Discharge Care Instructions  ?(From admission, onward)  ?  ? ? ?  ? ?  Start     Ordered  ? 02/21/22 0000  Leave dressing on - Keep

## 2022-02-21 NOTE — Progress Notes (Signed)
Report called to kim at [facility] ? ?

## 2022-02-21 NOTE — Care Management Important Message (Signed)
Important Message ? ?Patient Details  ?Name: Laura Frey ?MRN: [id number] ?Date of Birth: 08-23-1932 ? ? ?Medicare Important Message Given:  Yes ? ? ? ? ?Dannette Barbara ?02/21/2022, 12:21 PM ?

## 2022-02-21 NOTE — Progress Notes (Signed)
Patient had a percutaneous pigtail drainage catheter placed by IR during this admission and planning for discharge soon. The patient should discharge with a prescription for saline flushes and flush drainage catheter with 5 cc of sterile saline daily and record output daily.  ? ?Order to our IR clinic was placed and the patient will hear from our clinic staff to schedule drain follow up appointment time. ? ?[facility] ?[address]  ?[address]  ?[place] [address] ?[phone] ? ?Tsosie Billing D, PA-C ?02/21/2022, 1:17 PM ? ?  ?

## 2022-02-21 NOTE — TOC Transition Note (Signed)
Transition of Care (TOC) - CM/SW Discharge Note ? ? ?Patient Details  ?Name: Laura Frey ?MRN: [id number] ?Date of Birth: 1932/05/03 ? ?Transition of Care (TOC) CM/SW Contact:  ?Candie Chroman, LCSW ?Phone Number: ?02/21/2022, 1:46 PM ? ? ?Clinical Narrative:  Patient has orders to discharge to [facility] today. RN has already called report. EMS transport has been arranged and she is 1st on the list. No further concerns. CSW signing off.  ? ?Final next level of care: [place] ?Barriers to Discharge: Continued Medical Work up ? ? ?Patient Goals and CMS Choice ?  ?  ?Choice offered to / list presented to : Patient, Adult Children ? ?Discharge Placement ?PASRR number recieved: 02/17/22 ?           ?Patient chooses bed at: [facility] [place] ?Patient to be transferred to facility by: EMS ?Name of family member notified: Keslie Gritz ?Patient and family notified of of transfer: 02/21/22 ? ?Discharge Plan and Services ?  ?  ?Post Acute Care Choice: Home Health          ?  ?  ?  ?  ?  ?HH Arranged: OT, PT, Nurse's Aide, RN ?[place] Agency: [place] ([place]) ?Date HH Agency Contacted: 02/16/22 ?  ?Representative spoke with at [place]: Floydene Flock ? ?Social Determinants of Health (SDOH) Interventions ?  ? ? ?Readmission Risk Interventions ?   ? View : No data to display.  ?  ?  ?  ? ? ? ? ? ?

## 2022-02-21 NOTE — Discharge Summary (Shared)
Report called to kim at [facility] ? ?

## 2022-02-22 LAB — AEROBIC/ANAEROBIC CULTURE W GRAM STAIN (SURGICAL/DEEP WOUND): Culture: NO GROWTH

## 2022-02-23 ENCOUNTER — Emergency Department: Payer: Medicare Other

## 2022-02-23 ENCOUNTER — Other Ambulatory Visit: Payer: Self-pay

## 2022-02-23 ENCOUNTER — Observation Stay
Admission: EM | Admit: 2022-02-23 | Discharge: 2022-02-25 | Disposition: A | Discharge status: Home Health | Payer: Medicare Other | Attending: Internal Medicine | Admitting: Internal Medicine

## 2022-02-23 DIAGNOSIS — R2681 Unsteadiness on feet: Secondary | ICD-10-CM | POA: Insufficient documentation

## 2022-02-23 DIAGNOSIS — Z515 Encounter for palliative care: Secondary | ICD-10-CM | POA: Insufficient documentation

## 2022-02-23 DIAGNOSIS — J449 Chronic obstructive pulmonary disease, unspecified: Secondary | ICD-10-CM | POA: Diagnosis not present

## 2022-02-23 DIAGNOSIS — J9601 Acute respiratory failure with hypoxia: Secondary | ICD-10-CM | POA: Diagnosis not present

## 2022-02-23 DIAGNOSIS — M6281 Muscle weakness (generalized): Secondary | ICD-10-CM | POA: Diagnosis not present

## 2022-02-23 DIAGNOSIS — I509 Heart failure, unspecified: Secondary | ICD-10-CM | POA: Diagnosis not present

## 2022-02-23 DIAGNOSIS — K5720 Diverticulitis of large intestine with perforation and abscess without bleeding: Secondary | ICD-10-CM | POA: Diagnosis not present

## 2022-02-23 DIAGNOSIS — R0602 Shortness of breath: Secondary | ICD-10-CM

## 2022-02-23 DIAGNOSIS — I110 Hypertensive heart disease with heart failure: Secondary | ICD-10-CM | POA: Insufficient documentation

## 2022-02-23 DIAGNOSIS — D72829 Elevated white blood cell count, unspecified: Secondary | ICD-10-CM | POA: Diagnosis not present

## 2022-02-23 DIAGNOSIS — K5780 Diverticulitis of intestine, part unspecified, with perforation and abscess without bleeding: Secondary | ICD-10-CM | POA: Diagnosis not present

## 2022-02-23 DIAGNOSIS — J90 Pleural effusion, not elsewhere classified: Principal | ICD-10-CM | POA: Insufficient documentation

## 2022-02-23 DIAGNOSIS — Z87891 Personal history of nicotine dependence: Secondary | ICD-10-CM | POA: Insufficient documentation

## 2022-02-23 DIAGNOSIS — Z20822 Contact with and (suspected) exposure to covid-19: Secondary | ICD-10-CM | POA: Insufficient documentation

## 2022-02-23 LAB — BASIC METABOLIC PANEL
Anion gap: 6 (ref 5–15)
BUN: 5 mg/dL — ABNORMAL LOW (ref 8–23)
CO2: 32 mmol/L (ref 22–32)
Calcium: 8.6 mg/dL — ABNORMAL LOW (ref 8.9–10.3)
Chloride: 96 mmol/L — ABNORMAL LOW (ref 98–111)
Creatinine, Ser: 0.57 mg/dL (ref 0.44–1.00)
GFR, Estimated: >60 mL/min (ref >60)
Glucose, Bld: 119 mg/dL — ABNORMAL HIGH (ref 70–99)
Potassium: 4.5 mmol/L (ref 3.5–5.1)
Sodium: 134 mmol/L — ABNORMAL LOW (ref 135–145)

## 2022-02-23 LAB — CBC
HCT: 35.3 % — ABNORMAL LOW (ref 36.0–46.0)
Hemoglobin: 11 g/dL — ABNORMAL LOW (ref 12.0–15.0)
MCH: 20 pg — ABNORMAL LOW (ref 26.0–34.0)
MCHC: 31.2 g/dL (ref 30.0–36.0)
MCV: 64.3 fL — ABNORMAL LOW (ref 80.0–100.0)
Platelets: 387 10*3/uL (ref 150–400)
RBC: 5.49 MIL/uL — ABNORMAL HIGH (ref 3.87–5.11)
RDW: 15.9 % — ABNORMAL HIGH (ref 11.5–15.5)
WBC: 14 10*3/uL — ABNORMAL HIGH (ref 4.0–10.5)
nRBC: 0 % (ref 0.0–0.2)

## 2022-02-23 LAB — TROPONIN I (HIGH SENSITIVITY)
Troponin I (High Sensitivity): 7 ng/L (ref <18)
Troponin I (High Sensitivity): 7 ng/L (ref <18)

## 2022-02-23 LAB — PROCALCITONIN: Procalcitonin: <0.1 ng/mL

## 2022-02-23 LAB — RESP PANEL BY RT-PCR (FLU A&B, COVID) ARPGX2
Influenza A by PCR: NEGATIVE
Influenza B by PCR: NEGATIVE
SARS Coronavirus 2 by RT PCR: NEGATIVE

## 2022-02-23 LAB — BRAIN NATRIURETIC PEPTIDE: B Natriuretic Peptide: 124.9 pg/mL — ABNORMAL HIGH (ref 0.0–100.0)

## 2022-02-23 MED ORDER — DULOXETINE HCL 20 MG PO CPEP
20.0000 mg | ORAL_CAPSULE | Freq: Every day | ORAL | Status: DC
  Administered 2022-02-24 – 2022-02-25 (×2): 20 mg via ORAL
  Filled 2022-02-23 (×2): qty 1

## 2022-02-23 MED ORDER — ORAL CARE MOUTH RINSE
15.0000 mL | Freq: Two times a day (BID) | OROMUCOSAL | Status: DC
  Administered 2022-02-24 (×2): 15 mL via OROMUCOSAL

## 2022-02-23 MED ORDER — LEVOTHYROXINE SODIUM 50 MCG PO TABS
75.0000 ug | ORAL_TABLET | Freq: Every day | ORAL | Status: DC
  Administered 2022-02-24: 75 ug via ORAL
  Filled 2022-02-23: qty 2

## 2022-02-23 MED ORDER — METOPROLOL SUCCINATE ER 50 MG PO TB24
200.0000 mg | ORAL_TABLET | Freq: Every day | ORAL | Status: DC
  Administered 2022-02-24 – 2022-02-25 (×2): 200 mg via ORAL
  Filled 2022-02-23 (×2): qty 4

## 2022-02-23 MED ORDER — IBUPROFEN 600 MG PO TABS
600.0000 mg | ORAL_TABLET | ORAL | Status: AC
  Administered 2022-02-23: 600 mg via ORAL
  Filled 2022-02-23: qty 1

## 2022-02-23 MED ORDER — ADULT MULTIVITAMIN W/MINERALS CH
1.0000 | ORAL_TABLET | Freq: Every day | ORAL | Status: DC
  Administered 2022-02-24 – 2022-02-25 (×2): 1 via ORAL
  Filled 2022-02-23 (×2): qty 1

## 2022-02-23 MED ORDER — MAGNESIUM OXIDE 400 MG PO TABS
400.0000 mg | ORAL_TABLET | Freq: Every day | ORAL | Status: DC
Start: 2022-02-24 — End: 2022-02-25
  Administered 2022-02-24 – 2022-02-25 (×2): 400 mg via ORAL
  Filled 2022-02-23 (×4): qty 1

## 2022-02-23 MED ORDER — ASPIRIN-CAFFEINE 1000-65 MG PO PACK: 1.0000 | PACK | Freq: Two times a day (BID) | ORAL | Status: DC | PRN

## 2022-02-23 MED ORDER — ENOXAPARIN SODIUM 40 MG/0.4ML IJ SOSY
40.0000 mg | PREFILLED_SYRINGE | INTRAMUSCULAR | Status: DC
  Administered 2022-02-23: 40 mg via SUBCUTANEOUS
  Filled 2022-02-23: qty 0.4

## 2022-02-23 MED ORDER — LORATADINE 10 MG PO TABS
10.0000 mg | ORAL_TABLET | Freq: Every day | ORAL | Status: DC
  Administered 2022-02-24 – 2022-02-25 (×2): 10 mg via ORAL
  Filled 2022-02-23 (×2): qty 1

## 2022-02-23 MED ORDER — PANTOPRAZOLE SODIUM 40 MG PO TBEC
40.0000 mg | DELAYED_RELEASE_TABLET | Freq: Every day | ORAL | Status: DC
Start: 2022-02-24 — End: 2022-02-25
  Administered 2022-02-24 – 2022-02-25 (×2): 40 mg via ORAL
  Filled 2022-02-23 (×2): qty 1

## 2022-02-23 MED ORDER — IPRATROPIUM-ALBUTEROL 0.5-2.5 (3) MG/3ML IN SOLN
3.0000 mL | Freq: Once | RESPIRATORY_TRACT | Status: AC
  Administered 2022-02-23: 3 mL via RESPIRATORY_TRACT
  Filled 2022-02-23: qty 3

## 2022-02-23 MED ORDER — AMOXICILLIN-POT CLAVULANATE 875-125 MG PO TABS
1.0000 | ORAL_TABLET | Freq: Two times a day (BID) | ORAL | Status: DC
  Administered 2022-02-23 – 2022-02-25 (×4): 1 via ORAL
  Filled 2022-02-23 (×4): qty 1

## 2022-02-23 MED ORDER — MAGNESIUM HYDROXIDE 400 MG/5ML PO SUSP: 30.0000 mL | Freq: Every evening | ORAL | Status: DC | PRN

## 2022-02-23 MED ORDER — ALBUTEROL SULFATE (2.5 MG/3ML) 0.083% IN NEBU: 3.0000 mL | INHALATION_SOLUTION | Freq: Four times a day (QID) | RESPIRATORY_TRACT | Status: DC | PRN

## 2022-02-23 MED ORDER — AMLODIPINE BESYLATE 5 MG PO TABS
10.0000 mg | ORAL_TABLET | Freq: Every day | ORAL | Status: DC
  Administered 2022-02-24 – 2022-02-25 (×2): 10 mg via ORAL
  Filled 2022-02-23 (×3): qty 2

## 2022-02-23 MED ORDER — METHYLPREDNISOLONE SODIUM SUCC 125 MG IJ SOLR
125.0000 mg | INTRAMUSCULAR | Status: AC
  Administered 2022-02-23: 125 mg via INTRAVENOUS
  Filled 2022-02-23: qty 2

## 2022-02-23 NOTE — Progress Notes (Signed)
Patient came to room and was sleeping almost lethargic. Patient did wake up when we were turning her and changing her into her gown and getting telemetry on her. Patient went back to sleep she is hard to arouse, she couldn't eat dinner or take her evening medication. Made MD aware, no new orders at this time. ?

## 2022-02-23 NOTE — H&P (Signed)
?History and Physical  ? ? ?Patient: Laura Frey TKP:[id number] DOB: October 11, 1932 ?DOA: 02/23/2022 ?DOS: the patient was seen and examined on 02/23/2022 ?PCP: Marinda Elk, MD  ?Patient coming from: SNF[facility] ? ?Chief Complaint:  ?Chief Complaint  ?Patient presents with  ? Shortness of Breath  ?  At [facility], sob , pts son called, pt had recent bowel obstruction surgery and drain , svn given by ems   ? ?HPI: Laura Frey is a 88 y.o. female with medical history significant of CHF, COPD, depression is being readmitted for worsening shortness of breath and new onset pleural effusion.  Patient was discharged on 02/21/2022 to [facility].  She had a percutaneous pigtail drainage placed by IR on 5/8 for perforated diverticulitis with complication leading to pneumoperitoneum and new loculated effusion concerning for abscess. ? ?Son felt that patient was not getting care that she needs at [facility] and called 911 claiming that patient had some difficulty breathing per son patient was not given appropriate diet and not getting any therapy while there at [facility].  He is hoping to take her home with home health at discharge from here. ? ?While in the ED she underwent chest x-ray which shows small new left pleural effusion.  She is being admitted for further evaluation and management.  Son is wanting evaluation of her pigtail catheter to see if it can come out while she is here. ? ? ?Review of Systems: As mentioned in the history of present illness. All other systems reviewed and are negative. ?Past Medical History:  ?Diagnosis Date  ? Anemia   ? hx Thalassemia minor anemia  ? Asthma   ? Cancer [facility])   ? melanoma on back  ? Chronic back pain   ? ESI/uses BC powders  ? Depression   ? GERD (gastroesophageal reflux disease)   ? Headache(784.0)   ? Hypertension   ? Hypothyroidism   ? Peripheral vascular disease ([place])   ? legs  ? Sepsis ([place]) 01/27/2014  ? Shortness of breath   ? Tendinitis of  both rotator cuffs   ? UTI (urinary tract infection)   ? ?Past Surgical History:  ?Procedure Laterality Date  ? ABDOMINAL HYSTERECTOMY    ? vagina  ? ANTERIOR CERVICAL DECOMP/DISCECTOMY FUSION N/A 05/01/2013  ? Procedure: ANTERIOR CERVICAL DECOMPRESSION/DISCECTOMY FUSION 3 LEVELS;  Surgeon: Elaina Hoops, MD;  Location: [place] [facility];  Service: Neurosurgery;  Laterality: N/A;  ANTERIOR CERVICAL DECOMPRESSION/DISCECTOMY FUSION 3 LEVELS  ? APPENDECTOMY    ? BLADDER REPAIR    ? after hysterectomy same day  ? EYE SURGERY Bilateral   ? cataracts  ? TONSILLECTOMY    ? TUBAL LIGATION    ? ?Social History:  reports that she quit smoking about 26 years ago. Her smoking use included cigarettes. She has a 30.00 pack-year smoking history. She has never used smokeless tobacco. She reports that she does not drink alcohol and does not use drugs. ? ?Allergies  ?Allergen Reactions  ? Ciprofloxacin Swelling and Rash  ? Hydrocodone   ?  hallucinations  ? Oxycodone   ?  hallucinations  ? Zoloft [Sertraline Hcl]   ?  Burns stomach  ? Sertraline Other (See Comments)  ?  "Burns stomach"  ? Sulfa Antibiotics   ?  Blisters in mouth  ? Tylenol [Acetaminophen]   ?  headaches  ? Ultram [Tramadol]   ?  headaches  ? ? ?Family History  ?Problem Relation Age of Onset  ?  Thalassemia Son   ? Thalassemia Paternal Aunt   ? ? ?Prior to Admission medications   ?Medication Sig Start Date End Date Taking? Authorizing Provider  ?albuterol (PROVENTIL HFA;VENTOLIN HFA) 108 (90 BASE) MCG/ACT inhaler Inhale 2 puffs into the lungs every 6 (six) hours as needed for wheezing.    [provider]  ?amLODipine (NORVASC) 5 MG tablet Take 2 tablets (10 mg total) by mouth daily. 02/04/14   Nita Sells, MD  ?amoxicillin-clavulanate (AUGMENTIN) 875-125 MG tablet Take 1 tablet by mouth 2 (two) times daily for 5 days. 02/21/22 02/26/22  Lorella Nimrod, MD  ?Aspirin-Caffeine 1000-65 MG PACK Take 1 packet by mouth 2 (two) times daily.    [provider]   ?Cholecalciferol 10 MCG (400 UNIT) CAPS Take by mouth. Take by mouth daily.    [provider]  ?DULoxetine (CYMBALTA) 60 MG capsule  05/14/18   [provider]  ?fexofenadine (ALLEGRA) 180 MG tablet Take 180 mg by mouth daily.    [provider]  ?levothyroxine (SYNTHROID, LEVOTHROID) 75 MCG tablet Take 75 mcg by mouth daily before breakfast.    [provider]  ?magnesium hydroxide (MILK OF MAGNESIA) 400 MG/5ML suspension Take 30 mLs by mouth at bedtime as needed for mild constipation. 02/21/22   Lorella Nimrod, MD  ?magnesium oxide (MAG-OX) 400 MG tablet Take 400 mg by mouth daily.    [provider]  ?metoprolol (TOPROL-XL) 200 MG 24 hr tablet Take 200 mg by mouth daily.    [provider]  ?Multiple Vitamin (MULTIVITAMIN WITH MINERALS) TABS Take 1 tablet by mouth daily.    [provider]  ?omeprazole (PRILOSEC) 20 MG capsule Take 20 mg by mouth daily.    [provider]  ?VITAMIN E PO Take 500 Units by mouth daily.    [provider]  ? ? ?Physical Exam: ?Vitals:  ? 02/23/22 0092 02/23/22 3300 02/23/22 0925  ?BP:   (!) 141/67  ?Pulse: 95    ?Resp: 19    ?Temp: 98.8 ?F (37.1 ?C)    ?TempSrc: Oral    ?SpO2: 96%    ?Weight:  85 kg   ?Height:  '5\' 7"'$  (1.702 m)   ? ?General.     In no acute distress. ?Pulmonary.  Lungs clear bilaterally, normal respiratory effort. ?CV.  Regular rate and rhythm, no JVD, rub or murmur. ?Abdomen.  Soft, nontender, nondistended, BS positive.  Right pigtail drain with serous fluid. ?CNS.  Alert and oriented .  No focal neurologic deficit. ?Extremities.  No edema, no cyanosis, pulses intact and symmetrical. ?Psychiatry.  Judgment and insight appears normal.  ? ?Data Reviewed: ? ?WBC of 14.0.  Hemoglobin 11.0 ? ?Assessment and Plan: ?No notes have been filed under this hospital service. ?Service: Hospitalist ? ?New small pleural effusion ?Patient is not quite symptomatic from this.  I would just monitor for now I  doubt any intervention needed.  BNP of 124 is not suggestive of new onset CHF ? ?Leukocytosis ?Likely reactive, could be bronchitis ?Patient is already on antibiotic from last discharge.  I would continue same for now ? ?Acute hypoxic respiratory failure ?Patient is requiring 4 L oxygen via nasal cannula ? ?Diverticulitis with abscess status post pigtail drain by IR on 5/8 ?On Augmentin ?We will request surgery to evaluate for removal of drain per son request ? ?Disposition ?Her son is requesting her to be discharged home with home health instead of going to [facility] which is very reasonable ? ? ?  Advance Care Planning:   Code Status: Full Code confirmed with son at bedside ? ?Consults: None ? ?Family Communication: d/w son at bedside ? ?Severity of Illness: ?The appropriate patient status for this patient is OBSERVATION. Observation status is judged to be reasonable and necessary in order to provide the required intensity of service to ensure the patient's safety. The patient's presenting symptoms, physical exam findings, and initial radiographic and laboratory data in the context of their medical condition is felt to place them at decreased risk for further clinical deterioration. Furthermore, it is anticipated that the patient will be medically stable for discharge from the hospital within 2 midnights of admission.  ? ?Author: ?Max Sane, MD ?02/23/2022 2:14 PM ? ?For on call review [email or web address].  ?

## 2022-02-23 NOTE — Plan of Care (Signed)
?  Problem: Coping: ?Goal: Level of anxiety will decrease ?Outcome: Not Applicable ?  ?Problem: Safety: ?Goal: Ability to remain free from injury will improve ?Outcome: Progressing ?  ?

## 2022-02-23 NOTE — ED Triage Notes (Signed)
At [facility], sob , pts son called, pt had recent bowel obstruction surgery and drain , svn given by ems  ?

## 2022-02-23 NOTE — Progress Notes (Signed)
Came by to ensure that patient and family or caregivers are aware that we know she has been readmitted.  She continues to be quite lethargic.  I know if no change or new abdominal pain or discomfort.  Her abdomen remains benign on exam. ?Drain continues to be clear serous.  White blood cell count is 14 up from 6.5 on May 5. ?Chest x-ray changes noted. ?We will monitor output with you and remove drain as soon as feasible. ? ?We appreciate the medical admission, and the alert of her readmission. ? ?

## 2022-02-23 NOTE — ED Notes (Signed)
Informed RN bed assigned 

## 2022-02-23 NOTE — ED Provider Notes (Signed)
? ?[facility] ?Provider Note ? ? ? Event Date/Time  ? First MD Initiated Contact with Patient 02/23/22 [phone]   ?  (approximate) ? ? ?History  ? ?Shortness of Breath (At [facility], sob , pts son called, pt had recent bowel obstruction surgery and drain , svn given by ems ) ? ? ?HPI ? ?Laura Frey is a 88 y.o. female with history of recent perforated diverticulum, severe sepsis, hypoxic respiratory failure COPD hypertension hypothyroidism and CHF.  I have reviewed patient's discharge summary from May 8 and summarized as above.  Percutaneous drain and also had evaluation for DVT PE due to shortness of breath.  Patient's respiratory failure was felt likely secondary to sepsis.  Also supplemental oxygen treatment.  There was no concern for COPD exacerbation as per hospital notes ? ?Patient reports that she has not been happy with the care she has received at the [facility] facility to this time, particularly as she feels that they have not been able to reasonably treat her and have not given her a good diet.  She reports that they do not like the facility, and would like to consider possibly home care. ? ?In addition, her son reports that he called 911 as it seemed that she was having some wheezing and difficulty breathing labored breathing this morning while eating breakfast.  She has had this before, and was treated with nebulizer treatment. ? ?Patient reports she has noticed some wheezing over the last couple days and mild shortness of breath for the last day.  Denies shortness of breath or chest pain.  No leg swelling.  Reports she feels like she has a little bit of wheezing and slight productive cough. ?  ? ? ?Physical Exam  ? ?Triage Vital Signs: ?ED Triage Vitals  ?Enc Vitals Group  ?   BP 02/23/22 0925 (!) 141/67  ?   Pulse Rate 02/23/22 0922 95  ?   Resp 02/23/22 0922 19  ?   Temp 02/23/22 0922 98.8 ?F (37.1 ?C)  ?   Temp Source 02/23/22 0922 Oral  ?   SpO2 02/23/22 0922 96 %   ?   Weight 02/23/22 0923 187 lb 6.3 oz (85 kg)  ?   Height 02/23/22 0923 '5\' 7"'$  (1.702 m)  ?   Head Circumference --   ?   [facility] --   ?   Pain Score 02/23/22 0923 0  ?   Pain Loc --   ?   Pain Edu? --   ?   Excl. in [place]? --   ? ? ?Most recent vital signs: ?Vitals:  ? 02/23/22 0922 02/23/22 0925  ?BP:  (!) 141/67  ?Pulse: 95   ?Resp: 19   ?Temp: 98.8 ?F (37.1 ?C)   ?SpO2: 96%   ? ? ? ?General: Awake, no distress.  Appears just slightly dyspneic ?CV:  Good peripheral perfusion.  Normal rate and heart ?Resp:  Just slightly dyspneic.  Saturation 94% on 2 L, dropped to about 90% as she speaks.  Goes back up after she stops talking.  Speaks in phrases.  Very mild accessory muscle use.  Very faint crackles noted in all lung base on the right, diminished lung sounds over the left lower lung base.  No acute distress or extremis ?Abd:  No distention.  ?Other:  No calf tenderness or lower extremity edema ? ?Patient's right gluteal surgical drain, was changed today per patient and had discharge, currently has a new drain  in place without drainage into it. ? ?Able to range the hips well, but reports tenderness over her right lower buttock region.  She reports its been present for a week now.  Occurred after she had a fall off the commode ? ?No deformity or shortening ? ? ?ED Results / Procedures / Treatments  ? ?Labs ?(all labs ordered are listed, but only abnormal results are displayed) ?Labs Reviewed  ?CBC - Abnormal; Notable for the following components:  ?    Result Value  ? WBC 14.0 (*)   ? RBC 5.49 (*)   ? Hemoglobin 11.0 (*)   ? HCT 35.3 (*)   ? MCV 64.3 (*)   ? MCH 20.0 (*)   ? RDW 15.9 (*)   ? All other components within normal limits  ?BASIC METABOLIC PANEL - Abnormal; Notable for the following components:  ? Sodium 134 (*)   ? Chloride 96 (*)   ? Glucose, Bld 119 (*)   ? BUN 5 (*)   ? Calcium 8.6 (*)   ? All other components within normal limits  ?BRAIN NATRIURETIC PEPTIDE - Abnormal; Notable for the following  components:  ? B Natriuretic Peptide 124.9 (*)   ? All other components within normal limits  ?RESP PANEL BY RT-PCR (FLU A&B, COVID) [id number]  ?CULTURE, BLOOD (ROUTINE X 2)  ?CULTURE, BLOOD (ROUTINE X 2)  ?PROCALCITONIN  ?TROPONIN I (HIGH SENSITIVITY)  ?TROPONIN I (HIGH SENSITIVITY)  ? ? ? ?EKG ? ?Reviewed and interpreted by me at 930 ?Heart rate 99 QRS 140 ?QTc 470 ?Normal sinus rhythm, left bundle branch block.  No obvious ischemia is present but given left bundle ? ? ?RADIOLOGY ? ?Personally interpreted the patient's chest x-ray, concerning for left pleural effusion ? ?DG Chest 2 View ? ?Result Date: 02/23/2022 ?CLINICAL DATA:  Dyspnea, RIGHT hip pain for 1 week after slipping on the commode EXAM: CHEST - 2 VIEW COMPARISON:  02/13/2022 FINDINGS: Normal heart size, mediastinal contours, and pulmonary vascularity. Atherosclerotic calcification aorta. Mild RIGHT basilar atelectasis. Small LEFT pleural effusion and accompanying LEFT basilar atelectasis. Chronic central peribronchial thickening. Upper lungs clear. No pneumothorax. Bones demineralized with note of prior cervical spine fusion. IMPRESSION: New LEFT pleural effusion and basilar atelectasis. Chronic bronchitic changes with subsegmental atelectasis at RIGHT base. Aortic Atherosclerosis (ICD10-I70.0). Electronically Signed   By: Lavonia Dana M.D.   On: 02/23/2022 10:20  ? ?DG HIP UNILAT WITH PELVIS 2-3 VIEWS RIGHT ? ?Result Date: 02/23/2022 ?CLINICAL DATA:  RIGHT hip pain for 1 week after slipping on the commode EXAM: DG HIP (WITH OR WITHOUT PELVIS) 2-3V RIGHT COMPARISON:  None FINDINGS: Osseous demineralization. Hip and SI joint spaces preserved. No acute fracture, dislocation, or bone destruction. Lateral soft tissue swelling at the level of the hip. Pigtail drainage catheter in RIGHT pelvis. Diverticulosis of descending and sigmoid colon. IMPRESSION: Osseous demineralization without acute bony findings. Colonic diverticulosis. Electronically Signed   By: Lavonia Dana M.D.   On: 02/23/2022 10:21   ? ? ? ?PROCEDURES: ? ?Critical Care performed: No ? ?Procedures ? ? ?MEDICATIONS ORDERED IN ED: ?Medications  ?metoprolol succinate (TOPROL-XL) 24 hr tablet 200 mg (has no administration in time range)  ?magnesium oxide (MAG-OX) tablet 400 mg (has no administration in time range)  ?multivitamin with minerals tablet 1 tablet (has no administration in time range)  ?albuterol (PROVENTIL) (2.5 MG/3ML) 0.083% nebulizer solution 3 mL (has no administration in time range)  ?levothyroxine (SYNTHROID) tablet 75 mcg (has no administration in time range)  ?  amLODipine (NORVASC) tablet 10 mg (has no administration in time range)  ?DULoxetine (CYMBALTA) DR capsule 20 mg (has no administration in time range)  ?Aspirin-Caffeine 1000-65 MG PACK 1 packet (has no administration in time range)  ?amoxicillin-clavulanate (AUGMENTIN) 875-125 MG per tablet 1 tablet (has no administration in time range)  ?magnesium hydroxide (MILK OF MAGNESIA) suspension 30 mL (has no administration in time range)  ?pantoprazole (PROTONIX) EC tablet 40 mg (has no administration in time range)  ?loratadine (CLARITIN) tablet 10 mg (has no administration in time range)  ?enoxaparin (LOVENOX) injection 40 mg (has no administration in time range)  ?ipratropium-albuterol (DUONEB) 0.5-2.5 (3) MG/3ML nebulizer solution 3 mL (3 mLs Nebulization Given 02/23/22 1019)  ?methylPREDNISolone sodium succinate (SOLU-MEDROL) 125 mg/2 mL injection 125 mg (125 mg Intravenous Given 02/23/22 1019)  ?ibuprofen (ADVIL) tablet 600 mg (600 mg Oral Given 02/23/22 1055)  ? ? ? ?IMPRESSION / MDM / ASSESSMENT AND PLAN / ED COURSE  ?I reviewed the triage vital signs and the nursing notes. ?             ?               ? ?Differential diagnosis includes, but is not limited to, new pleural effusion, CHF, pneumonia, COPD, reactive airway disease, felt less likely ACS as no chest pain, also was recently excluded for PE and DVT studies which were negative  studies within the last admission, no pleuritic chest pain or signs or symptoms that would obviously suggest a need to reevaluate for PE at this time.  Based on the chest x-ray I am concerned the patient has

## 2022-02-24 ENCOUNTER — Encounter: Payer: Self-pay | Admitting: Internal Medicine

## 2022-02-24 ENCOUNTER — Observation Stay: Payer: Medicare Other

## 2022-02-24 DIAGNOSIS — K5720 Diverticulitis of large intestine with perforation and abscess without bleeding: Secondary | ICD-10-CM

## 2022-02-24 DIAGNOSIS — Z515 Encounter for palliative care: Secondary | ICD-10-CM

## 2022-02-24 DIAGNOSIS — R0602 Shortness of breath: Secondary | ICD-10-CM

## 2022-02-24 DIAGNOSIS — J90 Pleural effusion, not elsewhere classified: Secondary | ICD-10-CM | POA: Diagnosis not present

## 2022-02-24 DIAGNOSIS — J9601 Acute respiratory failure with hypoxia: Secondary | ICD-10-CM

## 2022-02-24 DIAGNOSIS — Z7189 Other specified counseling: Secondary | ICD-10-CM | POA: Diagnosis not present

## 2022-02-24 LAB — CBC
HCT: 33.8 % — ABNORMAL LOW (ref 36.0–46.0)
Hemoglobin: 10.5 g/dL — ABNORMAL LOW (ref 12.0–15.0)
MCH: 19.9 pg — ABNORMAL LOW (ref 26.0–34.0)
MCHC: 31.1 g/dL (ref 30.0–36.0)
MCV: 64.1 fL — ABNORMAL LOW (ref 80.0–100.0)
Platelets: 419 10*3/uL — ABNORMAL HIGH (ref 150–400)
RBC: 5.27 MIL/uL — ABNORMAL HIGH (ref 3.87–5.11)
RDW: 15.9 % — ABNORMAL HIGH (ref 11.5–15.5)
WBC: 11.8 10*3/uL — ABNORMAL HIGH (ref 4.0–10.5)
nRBC: 0 % (ref 0.0–0.2)

## 2022-02-24 LAB — BASIC METABOLIC PANEL
Anion gap: 6 (ref 5–15)
BUN: 10 mg/dL (ref 8–23)
CO2: 33 mmol/L — ABNORMAL HIGH (ref 22–32)
Calcium: 8.8 mg/dL — ABNORMAL LOW (ref 8.9–10.3)
Chloride: 98 mmol/L (ref 98–111)
Creatinine, Ser: 0.62 mg/dL (ref 0.44–1.00)
GFR, Estimated: >60 mL/min (ref >60)
Glucose, Bld: 169 mg/dL — ABNORMAL HIGH (ref 70–99)
Potassium: 4.5 mmol/L (ref 3.5–5.1)
Sodium: 137 mmol/L (ref 135–145)

## 2022-02-24 LAB — TSH: TSH: 4.927 u[IU]/mL — ABNORMAL HIGH (ref 0.350–4.500)

## 2022-02-24 MED ORDER — ENOXAPARIN SODIUM 40 MG/0.4ML IJ SOSY: 40.0000 mg | PREFILLED_SYRINGE | INTRAMUSCULAR | Status: DC

## 2022-02-24 MED ORDER — IOHEXOL 9 MG/ML PO SOLN
500.0000 mL | ORAL | Status: AC
  Administered 2022-02-24 (×2): 500 mL via ORAL

## 2022-02-24 MED ORDER — LEVOTHYROXINE SODIUM 88 MCG PO TABS
88.0000 ug | ORAL_TABLET | Freq: Every day | ORAL | Status: DC
  Administered 2022-02-25: 88 ug via ORAL
  Filled 2022-02-24: qty 1

## 2022-02-24 MED ORDER — IOHEXOL 300 MG/ML  SOLN
100.0000 mL | Freq: Once | INTRAMUSCULAR | Status: AC | PRN
  Administered 2022-02-24: 100 mL via INTRAVENOUS

## 2022-02-24 MED ORDER — IBUPROFEN 400 MG PO TABS
600.0000 mg | ORAL_TABLET | ORAL | Status: AC
  Administered 2022-02-24: 600 mg via ORAL
  Filled 2022-02-24: qty 2

## 2022-02-24 NOTE — TOC Progression Note (Signed)
Transition of Care (TOC) - Progression Note  ? ? ?Patient Details  ?Name: Laura Frey ?MRN: [id number] ?Date of Birth: 1932/01/21 ? ?Transition of Care (TOC) CM/SW Contact  ?Pete Pelt, RN ?Phone Number: ?02/24/2022, 2:00 PM ? ?Clinical Narrative: patient will discharge tomorrow per hospitalist.  Patient will go home with home health through [facility] per jason.  Home oxygen ordered through [employer]. ? ? ? ?Expected Discharge Plan: [place] ?Barriers to Discharge: Continued Medical Work up ? ?Expected Discharge Plan and Services ?Expected Discharge Plan: [place] ?  ?  ?  ?  ?                ?  ?  ?  ?  ?  ?  ?  ?  ?  ?  ? ? ?Social Determinants of Health (SDOH) Interventions ?  ? ?Readmission Risk Interventions ?   ? View : No data to display.  ?  ?  ?  ? ? ?

## 2022-02-24 NOTE — Progress Notes (Signed)
Patient now very alert. She is oriented x4. Significant change from initial assessment. Patient was very lethargic and sleepy. Granddaughter at bedside during initial assessment and very concerned about patient's mentation as she is normally the complete opposite. There was concern that the patient was not voiding. She has voided 650cc but the bedding was wet. It is suspected that the drainage may be coming from the drain to the right buttock. Dressing and linen were both changed. Nursing will reassess in a few hours. ?

## 2022-02-24 NOTE — Consult Note (Signed)
Consultation Note Date: 02/24/2022   Patient Name: Laura Frey  DOB: [birth year]  MRN: [id number]  Age / Sex: 88 y.o., female  PCP: Marinda Elk, MD Referring Physician: Max Sane, MD  Reason for Consultation: Establishing goals of care  HPI/Patient Profile: 88 y.o. female  with past medical history of CHF, COPD, depression, hypertension, hypothyroidism, and PVD admitted on 02/23/2022 with shortness of breath.  Patient recently discharged 5/8 to rehab facility after being hospitalized for perforated diverticulitis which required drain placement.  Patient found to have new small pleural effusion; unlikely to need intervention.  Also concern for bronchitis; continuing antibiotics.  PMT consulted to discuss goals of care.  Clinical Assessment and Goals of Care: I have reviewed medical records including EPIC notes, labs and imaging, assessed the patient and then met with patient and son Barbaraann Rondo to discuss diagnosis prognosis, [place], EOL wishes, disposition and options.  I introduced Palliative Medicine as specialized medical care for people living with serious illness. It focuses on providing relief from the symptoms and stress of a serious illness. The goal is to improve quality of life for both the patient and the family.  We reviewed recent stay at rehab facility-patient and family unhappy and do not want to return to rehab.  Family hopeful to return home with home health.  Patient's son explained that patient is mostly doing well -complains of some coughing.  Denies pain.  We reviewed plan for CT scan today to see if drain can be removed.  Patient and family encouraged by this.   We discussed patient's current illness and what it means in the larger context of patient's on-going co-morbidities.    I attempted to elicit values and goals of care important to the patient.    The difference between aggressive medical intervention and comfort  care was considered in light of the patient's goals of care.  Advance directives, concepts specific to code status, artificial feeding and hydration, and rehospitalization were considered and discussed. We reviewed a MOST form however patient was uncertain about options and not ready to complete form. We discussed CODE STATUS she tells me she is uncertain and would like time to think about it, son shares that she has previously expressed desire for full code.  For other options on the MOST form patient did confirm she would want to be rehospitalized if indicated and she also stated she would take a feeding tube for a trial if indicated.  Son shares there is no designated healthcare power of attorney; family to make decisions jointly.  Discussed with patient and son the importance of continued conversation with family and the medical providers regarding overall plan of care and treatment options, ensuring decisions are within the context of the patients values and GOCs.    Questions and concerns were addressed. The family was encouraged to call with questions or concerns.  Primary Decision Maker PATIENT joined by children    SUMMARY OF RECOMMENDATIONS   Family requesting return home with home health; do not want to return to rehab MOST form given to patient and family to review, discussed options, provided education, however no decisions made at this time as patient is unsure  Code Status/Advance Care Planning: Full code  Discharge Planning: Home with Home Health      Primary Diagnoses: Present on Admission:  Pleural effusion   I have reviewed the medical record, interviewed the patient and family, and examined the patient. The following aspects are pertinent.  Past Medical History:  Diagnosis Date   Anemia    hx Thalassemia minor anemia   Asthma    Cancer (HCC)    melanoma on back   Chronic back pain    ESI/uses BC powders   Depression    GERD (gastroesophageal reflux  disease)    Headache(784.0)    Hypertension    Hypothyroidism    Peripheral vascular disease (HCC)    legs   Sepsis ([place]) 01/27/2014   Shortness of breath    Tendinitis of both rotator cuffs    UTI (urinary tract infection)    Social History   Socioeconomic History   Marital status: Widowed    Spouse name: Not on file   Number of children: Not on file   Years of education: Not on file   Highest education level: Not on file  Occupational History   Not on file  Tobacco Use   Smoking status: Former    Packs/day: 1.00    Years: 30.00    Pack years: 30.00    Types: Cigarettes    Quit date: 04/30/1995    Years since quitting: 26.8   Smokeless tobacco: Never  Substance and Sexual Activity   Alcohol use: No   Drug use: No   Sexual activity: Not Currently  Other Topics Concern   Not on file  Social History Narrative   Not on file   Social Determinants of Health   Financial Resource Strain: Not on file  Food Insecurity: Not on file  Transportation Needs: Not on file  Physical Activity: Not on file  Stress: Not on file  Social Connections: Not on file   Family History  Problem Relation Age of Onset   Thalassemia Son    Thalassemia Paternal Aunt    Scheduled Meds:  amLODipine  10 mg Oral Daily   amoxicillin-clavulanate  1 tablet Oral BID   DULoxetine  20 mg Oral Daily   enoxaparin (LOVENOX) injection  40 mg Subcutaneous Q24H   [START ON 02/25/2022] levothyroxine  88 mcg Oral QAC breakfast   loratadine  10 mg Oral Daily   magnesium oxide  400 mg Oral Daily   mouth rinse  15 mL Mouth Rinse BID   metoprolol  200 mg Oral Daily   multivitamin with minerals  1 tablet Oral Daily   pantoprazole  40 mg Oral Daily   Continuous Infusions: PRN Meds:.albuterol, Aspirin-Caffeine, magnesium hydroxide Allergies  Allergen Reactions   Ciprofloxacin Swelling and Rash   Hydrocodone     hallucinations   Oxycodone     hallucinations   Zoloft [Sertraline Hcl]     Burns stomach    Amoxicillin Diarrhea   Sertraline Other (See Comments)    "Burns stomach"   Sulfa Antibiotics     Blisters in mouth   Trazodone     Other reaction(s): Unknown   Tylenol [Acetaminophen]     headaches   Ultram [Tramadol]     headaches   Cefuroxime Axetil Rash   Review of Systems  Constitutional:  Positive for activity change and fatigue.  Gastrointestinal:        Some loose stools  Neurological:  Positive for weakness.   Physical Exam Constitutional:      General: She is not in acute distress.    Appearance: She is ill-appearing.  Pulmonary:     Effort: Pulmonary effort is normal.  Skin:    General: Skin is warm and dry.  Neurological:     Mental Status: She is alert and oriented to  person, place, and time.    Vital Signs: BP 124/73 (BP Location: Left Arm)   Pulse (!) 104   Temp 97.8 F (36.6 C)   Resp 18   Ht _0  (1.702 m)   Wt 79.3 kg   SpO2 98%   BMI 27.38 kg/m  Pain Scale: 0-10   Pain Score: 0-No pain   SpO2: SpO2: 98 % O2 Device:SpO2: 98 % O2 Flow Rate: .O2 Flow Rate (L/min): 2 L/min  IO: Intake/output summary:  Intake/Output Summary (Last 24 hours) at 02/24/2022 1102 Last data filed at 02/24/2022 1020 Gross per 24 hour  Intake 240 ml  Output 1175 ml  Net -935 ml    LBM: Last BM Date : 02/23/22 Baseline Weight: Weight: 85 kg Most recent weight: Weight: 79.3 kg     Palliative Assessment/Data: PPS 50%     *Please note that this is a verbal dictation therefore any spelling or grammatical errors are due to the "[place] [employer]" system interpretation.  Juel Burrow, DNP, AGNP-C Palliative Medicine Team [phone] Pager: [phone]

## 2022-02-24 NOTE — Progress Notes (Signed)
Physical Therapy Evaluation ?Patient Details ?Name: Laura Frey ?MRN: [id number] ?DOB: 08/12/1932 ?Today's Date: 02/24/2022 ? ?History of Present Illness ? Pt admitted for pleural effusion with complaints of SOB symptoms. Pt with recent admission 4/30-5/8 with SNF stay. History includes anemia, GERD, HTN, depression, and PVD.  ?Clinical Impression ? Pt is a pleasant 88 year old female who was admitted for pleural effusion. Pt performs bed mobility with cga, transfers with mod assist, and ambulation with min assist and RW. All mobility performed on RA with sats decreasing rapidly. Pt demonstrates deficits with strength/mobility/endurance. Pt is making good progress and wishes not to return back to SNF. Family in agreement with transition to home when medically stable. Would benefit from skilled PT to address above deficits and promote optimal return to PLOF. Recommend transition to [place] upon discharge from acute hospitalization. ? ?SaO2 on room air at rest = 91% ?SaO2 on room air while ambulating = 81% ?SaO2 on 2 liters of O2 while resting= 96% ? ?   ? ?Recommendations for follow up therapy are one component of a multi-disciplinary discharge planning process, led by the attending physician.  Recommendations may be updated based on patient status, additional functional criteria and insurance authorization. ? ?Follow Up Recommendations Home health PT ? ?  ?Assistance Recommended at Discharge Frequent or constant Supervision/Assistance  ?Patient can return home with the following ? A little help with walking and/or transfers;A little help with bathing/dressing/bathroom;Assistance with cooking/housework;Assist for transportation ? ?  ?Equipment Recommendations None recommended by PT  ?Recommendations for Other Services ?    ?  ?Functional Status Assessment Patient has had a recent decline in their functional status and demonstrates the ability to make significant improvements in function in a reasonable and predictable  amount of time.  ? ?  ?Precautions / Restrictions Precautions ?Precautions: Fall ?Precaution Comments: jp drain gluteal ?Restrictions ?Weight Bearing Restrictions: No  ? ?  ? ?Mobility ? Bed Mobility ?Overal bed mobility: Needs Assistance ?Bed Mobility: Supine to Sit ?  ?  ?Supine to sit: Min guard ?  ?  ?General bed mobility comments: safe technique with use of bed rails to accomplish mobility. Once seated, slight dizziness, improved with time. All mobility performed on RA. ?  ? ?Transfers ?Overall transfer level: Needs assistance ?Equipment used: Rolling walker (2 wheels) ?Transfers: Sit to/from Stand ?Sit to Stand: Mod assist ?  ?  ?  ?  ?  ?General transfer comment: needs cues for foot placement and RW use. Once standing, forward flexed posture, bracing B LEs against bed. Cues for upright posture. ?  ? ?Ambulation/Gait ?Ambulation/Gait assistance: Min assist ?Gait Distance (Feet): 5 Feet ?Assistive device: Rolling walker (2 wheels) ?Gait Pattern/deviations: Step-to pattern ?  ?  ?  ?General Gait Details: ambulate several steps in room to window and then to recliner. Cues for sequencing. Very slow gait> O2 sats decreased to 81% with exertion on RA. Donned 2L of O2 with slow improvement to 91%. RN aware ? ?Stairs ?  ?  ?  ?  ?  ? ?Wheelchair Mobility ?  ? ?Modified Rankin (Stroke Patients Only) ?  ? ?  ? ?Balance Overall balance assessment: Needs assistance ?Sitting-balance support: Feet supported ?Sitting balance-Leahy Scale: Good ?  ?  ?Standing balance support: Bilateral upper extremity supported ?Standing balance-Leahy Scale: Fair ?  ?  ?  ?  ?  ?  ?  ?  ?  ?  ?  ?  ?   ? ? ? ?Pertinent Vitals/Pain Pain  Assessment ?Pain Assessment: No/denies pain  ? ? ?Home Living Family/patient expects to be discharged to:: Private residence ?Living Arrangements: Children (adult son lives with her) ?Available Help at Discharge: Family;Available 24 hours/day ?Type of Home: House ?Home Access: Ramped entrance ?  ?  ?  ?Home  Layout: One level ?Home Equipment: Rollator (4 wheels);Grab bars - tub/shower;Hand held shower head;Shower seat ?Additional Comments: was previously at SNF, pt/family prefer not to return  ?  ?Prior Function Prior Level of Function : Needs assist ?  ?  ?  ?  ?  ?  ?Mobility Comments: Uses a rollator for mobility, rarely out of the home. has been minimally mobile at SNF ?ADLs Comments: Pt.'s son assists pt. with ADLs, and IADLs, meal preparation, medication managements, and pillbox set-up. Pt. does not drive. ?  ? ? ?Hand Dominance  ?   ? ?  ?Extremity/Trunk Assessment  ? Upper Extremity Assessment ?Upper Extremity Assessment: Overall WFL for tasks assessed ?  ? ?Lower Extremity Assessment ?Lower Extremity Assessment: Generalized weakness (B LE grossly 3+/5) ?  ? ?   ?Communication  ? Communication: No difficulties  ?Cognition Arousal/Alertness: Awake/alert ?Behavior During Therapy: [facility] for tasks assessed/performed ?Overall Cognitive Status: Within Functional Limits for tasks assessed ?  ?  ?  ?  ?  ?  ?  ?  ?  ?  ?  ?  ?  ?  ?  ?  ?General Comments: alert and oriented, easily distracted ?  ?  ? ?  ?General Comments   ? ?  ?Exercises Other Exercises ?Other Exercises: supine/seated ther-ex performed on B LE including AP, LAQ, and alt marching. 10 reps with cues for technique ?Other Exercises: rolling performed to B sides for donning of brief due to incontience at baseline. Able to roll with cga  ? ?Assessment/Plan  ?  ?PT Assessment Patient needs continued PT services  ?PT Problem List Decreased strength;Decreased range of motion;Decreased activity tolerance;Decreased balance;Decreased mobility;Decreased safety awareness;Decreased knowledge of use of DME;Pain ? ?   ?  ?PT Treatment Interventions DME instruction;Gait training;Functional mobility training;Therapeutic activities;Therapeutic exercise;Balance training;Patient/family education   ? ?PT Goals (Current goals can be found in the Care Plan section)  ?Acute Rehab  PT Goals ?Patient Stated Goal: go home ?PT Goal Formulation: With patient/family ?Time For Goal Achievement: 03/10/22 ?Potential to Achieve Goals: Fair ? ?  ?Frequency Min 2X/week ?  ? ? ?Co-evaluation   ?  ?  ?  ?  ? ? ?  ?AM-PAC PT "6 Clicks" Mobility  ?Outcome Measure Help needed turning from your back to your side while in a flat bed without using bedrails?: A Little ?Help needed moving from lying on your back to sitting on the side of a flat bed without using bedrails?: A Little ?Help needed moving to and from a bed to a chair (including a wheelchair)?: A Lot ?Help needed standing up from a chair using your arms (e.g., wheelchair or bedside chair)?: A Lot ?Help needed to walk in hospital room?: A Lot ?Help needed climbing 3-5 steps with a railing? : Total ?6 Click Score: 13 ? ?  ?End of Session Equipment Utilized During Treatment: Gait belt;Oxygen ?Activity Tolerance: Patient tolerated treatment well ?Patient left: in chair;with chair alarm set;with family/visitor present;with SCD's reapplied ?Nurse Communication: Mobility status ?PT Visit Diagnosis: Muscle weakness (generalized) (M62.81);Difficulty in walking, not elsewhere classified (R26.2) ?  ? ?Time: 3825-0539 ?PT Time Calculation (min) (ACUTE ONLY): 33 min ? ? ?Charges:   PT Evaluation ?$PT Eval  Low Complexity: 1 Low ?PT Treatments ?$Therapeutic Exercise: 8-22 mins ?  ?   ? ? ?Greggory Stallion, PT, DPT, GCS ?[phone] ? ? ?Melford Tullier ?02/24/2022, 2:58 PM ?

## 2022-02-24 NOTE — [place] FL2 (Signed)
?[place] MEDICAID FL2 LEVEL OF CARE SCREENING TOOL  ?  ? ?IDENTIFICATION  ?Patient Name: ?Laura Frey Birthdate: 01/31/1932 Sex: female Admission Date (Current Location): ?02/23/2022  ?[place] and [place] Number: ? [place] ?  Facility and Address:  ?[facility], [address] [place], [place] [address] ?     Provider Number: ?[id number]  ?Attending Physician Name and Address:  ?Max Sane, MD ? Relative Name and Phone Number:  ?  ?   ?Current Level of Care: ?Hospital Recommended Level of Care: ?[place] Prior Approval Number: ?  ? ?Date Approved/Denied: ?  PASRR Number: ?[id number] A ? ?Discharge Plan: ?SNF ?  ? ?Current Diagnoses: ?Patient Active Problem List  ? Diagnosis Date Noted  ? Pleural effusion 02/23/2022  ? Perforated diverticulum of intestine 02/14/2022  ? CHF (congestive heart failure) ([place]) 02/14/2022  ? Chronic diastolic CHF (congestive heart failure) ([place]) 02/14/2022  ? Abdominal pain 02/14/2022  ? Acute respiratory failure with hypoxia ([place]) 02/14/2022  ? Closed compression fracture of L3 lumbar vertebra, initial encounter ([place]) 02/16/2021  ? Chronic low back pain (1ry area of Pain) (Bilateral) (R>L) w/o sciatica 01/07/2021  ? Spondylosis without myelopathy or radiculopathy, lumbar region 08/09/2018  ? Osteopenia of lumbar spine 08/06/2018  ? Complaints of weakness of lower extremity 07/17/2018  ? Lower extremity weakness 04/04/2018  ? Lower extremity numbness 04/04/2018  ? Disorder of skeletal system 01/01/2018  ? Pharmacologic therapy 01/01/2018  ? Problems influencing health status 01/01/2018  ? Cervicalgia 01/01/2018  ? Chronic upper back pain 01/01/2018  ? Lower extremity weakness (Bilateral) 01/01/2018  ? Frequent falls 12/11/2017  ? Gait instability 12/11/2017  ? COPD (chronic obstructive pulmonary disease) ([place]) 11/16/2017  ? Thalassemia 11/16/2017  ? Hypertension 11/16/2017  ? Hypothyroid 11/16/2017  ? Abnormal MRI, lumbar spine (2018)  11/16/2017  ? L2 compression fracture 11/16/2017  ? H/O cervical spine surgery (C4-C7 ACDF) 11/16/2017  ? Abnormal MRI, cervical spine (2014) 11/16/2017  ? Lumbar facet joint osteoarthritis (Bilateral) 11/16/2017  ? Lumbar facet hypertrophy (Bilateral) 11/16/2017  ? Lumbar facet syndrome (Bilateral) 11/16/2017  ? Osteoarthritis of lumbar spine 11/16/2017  ? Lumbar spondylosis 11/16/2017  ? Cervical spondylosis 11/16/2017  ? Cervical facet arthropathy (Bilateral) 11/16/2017  ? Chronic pain syndrome 11/16/2017  ? Osteoarthritis 11/16/2017  ? Lumbar Grade 1 Anterolisthesis of L3/L4 11/16/2017  ? Lumbosacral Grade 1 Retrolisthesis of L5/S1 11/16/2017  ? Lumbar lateral recess stenosis (Bilateral) 11/16/2017  ? Chronic lower extremity pain (2ry area of Pain) (Bilateral) (R>L) 11/16/2017  ? Chronic lower extremity radicular pain (Bilateral) (R>L) 11/16/2017  ? Chronic lumbar radiculitis (Right) 11/16/2017  ? DDD (degenerative disc disease), lumbar 11/16/2017  ? DDD (degenerative disc disease), cervical 11/16/2017  ? Hypoxia 02/27/2014  ? Anemia, unspecified 02/27/2014  ? Urinary tract infection 02/27/2014  ? Diastolic CHF, acute ([place]) 02/08/2014  ? Acute diastolic heart failure ([place]) 02/08/2014  ? Shortness of breath 02/05/2014  ? HTN (hypertension) 02/05/2014  ? Anemia 02/05/2014  ? SOB (shortness of breath) 02/05/2014  ? Nonspecific (abnormal) findings on radiological and other examination of gastrointestinal tract 01/31/2014  ? Severe sepsis ([place]) 01/27/2014  ? Hypotension, unspecified 01/27/2014  ? UTI (urinary tract infection) 01/27/2014  ? Unspecified hypothyroidism 01/27/2014  ? GERD (gastroesophageal reflux disease) 01/27/2014  ? Esophageal reflux 01/27/2014  ? Increased frequency of urination 02/12/2013  ? Other chronic cystitis without hematuria 02/12/2013  ? Recurrent UTI 02/12/2013  ? Urge incontinence 02/12/2013  ? ? ?Orientation RESPIRATION BLADDER Height & Weight   ?  ?  Self, Time, Situation, Place ? O2  Incontinent, External catheter Weight: 79.3 kg ?Height:  '5\' 7"'$  (170.2 cm)  ?BEHAVIORAL SYMPTOMS/MOOD NEUROLOGICAL BOWEL NUTRITION STATUS  ?    Continent Diet (soft)  ?AMBULATORY STATUS COMMUNICATION OF NEEDS Skin   ?Extensive Assist Verbally Normal ?  ?  ?  ?    ?     ?     ? ? ?Personal Care Assistance Level of Assistance  ?Bathing, Feeding, Dressing Bathing Assistance: Maximum assistance ?Feeding assistance: Maximum assistance ?Dressing Assistance: Maximum assistance ?   ? ?Functional Limitations Info  ?Sight, Hearing, Speech Sight Info: Adequate ?Hearing Info: Adequate ?Speech Info: Adequate  ? ? ?SPECIAL CARE FACTORS FREQUENCY  ?PT (By licensed PT), OT (By licensed OT)   ?  ?PT Frequency: 5x weekly ?OT Frequency: 5x weekly ?  ?  ?  ?   ? ? ?Contractures Contractures Info: Not present  ? ? ?Additional Factors Info  ?Code Status, Allergies Code Status Info: FULL CODE ?Allergies Info: Ciprofloxacin, Hydrocodone, Oxycodone, Zoloft (sertraline Hcl), sulta antibiotics, tylenol, ultran (tramadol) ?  ?  ?  ?   ? ?Current Medications (02/24/2022):  This is the current hospital active medication list ?Current Facility-Administered Medications  ?Medication Dose Route Frequency Provider Last Rate Last Admin  ? albuterol (PROVENTIL) (2.5 MG/3ML) 0.083% nebulizer solution 3 mL  3 mL Inhalation Q6H PRN Max Sane, MD      ? amLODipine (NORVASC) tablet 10 mg  10 mg Oral Daily Max Sane, MD      ? amoxicillin-clavulanate (AUGMENTIN) 875-125 MG per tablet 1 tablet  1 tablet Oral BID Max Sane, MD   1 tablet at 02/23/22 2242  ? Aspirin-Caffeine 1000-65 MG PACK 1 packet  1 packet Oral BID PRN Max Sane, MD      ? DULoxetine (CYMBALTA) DR capsule 20 mg  20 mg Oral Daily Manuella Ghazi, Vipul, MD      ? enoxaparin (LOVENOX) injection 40 mg  40 mg Subcutaneous Q24H Max Sane, MD   40 mg at 02/23/22 2246  ? [START ON 02/25/2022] levothyroxine (SYNTHROID) tablet 88 mcg  88 mcg Oral QAC breakfast Max Sane, MD      ? loratadine (CLARITIN)  tablet 10 mg  10 mg Oral Daily Manuella Ghazi, Vipul, MD      ? magnesium hydroxide (MILK OF MAGNESIA) suspension 30 mL  30 mL Oral QHS PRN Max Sane, MD      ? magnesium oxide (MAG-OX) tablet 400 mg  400 mg Oral Daily Max Sane, MD      ? MEDLINE mouth rinse  15 mL Mouth Rinse BID Max Sane, MD      ? metoprolol succinate (TOPROL-XL) 24 hr tablet 200 mg  200 mg Oral Daily Max Sane, MD      ? multivitamin with minerals tablet 1 tablet  1 tablet Oral Daily Manuella Ghazi, Vipul, MD      ? pantoprazole (PROTONIX) EC tablet 40 mg  40 mg Oral Daily Max Sane, MD      ? ? ? ?Discharge Medications: ?Please see discharge summary for a list of discharge medications. ? ?Relevant Imaging Results: ? ?Relevant Lab Results: ? ? ?Additional Information ?SSN [id number] ? ?Pete Pelt, RN ? ? ? ? ?

## 2022-02-24 NOTE — Progress Notes (Signed)
Patient complains of pain to the right buttock this morning with no prn pain medication order. NIght provider contacted via secure chate to request medication per patient request. ?

## 2022-02-24 NOTE — TOC Initial Note (Signed)
Transition of Care (TOC) - Initial/Assessment Note  ? ? ?Patient Details  ?Name: Laura Frey ?MRN: [id number] ?Date of Birth: Aug 30, 1932 ? ?Transition of Care (TOC) CM/SW Contact:    ?Pete Pelt, RN ?Phone Number: ?02/24/2022, 9:27 AM ? ?Clinical Narrative:      Snf recommended by PT OT, Bed search sent to [facility]           ? ? ?Expected Discharge Plan: [place] ?Barriers to Discharge: Continued Medical Work up ? ? ?Patient Goals and CMS Choice ?  ?  ?  ? ?Expected Discharge Plan and Services ?Expected Discharge Plan: [place] ?  ?  ?  ?  ?                ?  ?  ?  ?  ?  ?  ?  ?  ?  ?  ? ?Prior Living Arrangements/Services ?  ?  ?Patient language and need for interpreter reviewed:: Yes ?Do you feel safe going back to the place where you live?: Yes      ?Need for Family Participation in Patient Care: Yes (Comment) ?Care giver support system in place?: Yes (comment) ?  ?Criminal Activity/Legal Involvement Pertinent to Current Situation/Hospitalization: No - Comment as needed ? ?Activities of Daily Living ?Home Assistive Devices/Equipment: Eyeglasses, Dentures (specify type) ?ADL Screening (condition at time of admission) ?Patient's cognitive ability adequate to safely complete daily activities?: No ?Is the patient deaf or have difficulty hearing?: Yes ?Does the patient have difficulty seeing, even when wearing glasses/contacts?: No ?Does the patient have difficulty concentrating, remembering, or making decisions?: No ?Patient able to express need for assistance with ADLs?: Yes ?Does the patient have difficulty dressing or bathing?: Yes ?Independently performs ADLs?: No ?Does the patient have difficulty walking or climbing stairs?: Yes ?Weakness of Legs: Both ?Weakness of Arms/Hands: None ? ?Permission Sought/Granted ?Permission sought to share information with : Case Freight forwarder, Customer service manager ?Permission granted to share information with : Yes, Verbal  Permission Granted ?   ? Permission granted to share info w AGENCY: SNF, [facility] ?   ?   ? ?Emotional Assessment ?Appearance:: Appears stated age ?Attitude/Demeanor/Rapport: Gracious ?Affect (typically observed): Pleasant ?Orientation: : Oriented to Self, Oriented to Place, Oriented to  Time ?Alcohol / Substance Use: Alcohol Use ?Psych Involvement: No (comment) ? ?Admission diagnosis:  Shortness of breath [R06.02] ?Pleural effusion [J90] ?Pleural effusion on left [J90] ?Patient Active Problem List  ? Diagnosis Date Noted  ? Pleural effusion 02/23/2022  ? Perforated diverticulum of intestine 02/14/2022  ? CHF (congestive heart failure) ([place]) 02/14/2022  ? Chronic diastolic CHF (congestive heart failure) ([place]) 02/14/2022  ? Abdominal pain 02/14/2022  ? Acute respiratory failure with hypoxia ([place]) 02/14/2022  ? Closed compression fracture of L3 lumbar vertebra, initial encounter ([place]) 02/16/2021  ? Chronic low back pain (1ry area of Pain) (Bilateral) (R>L) w/o sciatica 01/07/2021  ? Spondylosis without myelopathy or radiculopathy, lumbar region 08/09/2018  ? Osteopenia of lumbar spine 08/06/2018  ? Complaints of weakness of lower extremity 07/17/2018  ? Lower extremity weakness 04/04/2018  ? Lower extremity numbness 04/04/2018  ? Disorder of skeletal system 01/01/2018  ? Pharmacologic therapy 01/01/2018  ? Problems influencing health status 01/01/2018  ? Cervicalgia 01/01/2018  ? Chronic upper back pain 01/01/2018  ? Lower extremity weakness (Bilateral) 01/01/2018  ? Frequent falls 12/11/2017  ? Gait instability 12/11/2017  ? COPD (chronic obstructive pulmonary disease) ([place]) 11/16/2017  ? Thalassemia 11/16/2017  ?  Hypertension 11/16/2017  ? Hypothyroid 11/16/2017  ? Abnormal MRI, lumbar spine (2018) 11/16/2017  ? L2 compression fracture 11/16/2017  ? H/O cervical spine surgery (C4-C7 ACDF) 11/16/2017  ? Abnormal MRI, cervical spine (2014) 11/16/2017  ? Lumbar facet joint osteoarthritis (Bilateral)  11/16/2017  ? Lumbar facet hypertrophy (Bilateral) 11/16/2017  ? Lumbar facet syndrome (Bilateral) 11/16/2017  ? Osteoarthritis of lumbar spine 11/16/2017  ? Lumbar spondylosis 11/16/2017  ? Cervical spondylosis 11/16/2017  ? Cervical facet arthropathy (Bilateral) 11/16/2017  ? Chronic pain syndrome 11/16/2017  ? Osteoarthritis 11/16/2017  ? Lumbar Grade 1 Anterolisthesis of L3/L4 11/16/2017  ? Lumbosacral Grade 1 Retrolisthesis of L5/S1 11/16/2017  ? Lumbar lateral recess stenosis (Bilateral) 11/16/2017  ? Chronic lower extremity pain (2ry area of Pain) (Bilateral) (R>L) 11/16/2017  ? Chronic lower extremity radicular pain (Bilateral) (R>L) 11/16/2017  ? Chronic lumbar radiculitis (Right) 11/16/2017  ? DDD (degenerative disc disease), lumbar 11/16/2017  ? DDD (degenerative disc disease), cervical 11/16/2017  ? Hypoxia 02/27/2014  ? Anemia, unspecified 02/27/2014  ? Urinary tract infection 02/27/2014  ? Diastolic CHF, acute ([place]) 02/08/2014  ? Acute diastolic heart failure ([place]) 02/08/2014  ? Shortness of breath 02/05/2014  ? HTN (hypertension) 02/05/2014  ? Anemia 02/05/2014  ? SOB (shortness of breath) 02/05/2014  ? Nonspecific (abnormal) findings on radiological and other examination of gastrointestinal tract 01/31/2014  ? Severe sepsis ([place]) 01/27/2014  ? Hypotension, unspecified 01/27/2014  ? UTI (urinary tract infection) 01/27/2014  ? Unspecified hypothyroidism 01/27/2014  ? GERD (gastroesophageal reflux disease) 01/27/2014  ? Esophageal reflux 01/27/2014  ? Increased frequency of urination 02/12/2013  ? Other chronic cystitis without hematuria 02/12/2013  ? Recurrent UTI 02/12/2013  ? Urge incontinence 02/12/2013  ? ?PCP:  Marinda Elk, MD ?Pharmacy:   ?[facility] - [place], [address] ?[address] ?[place] [address] ?Phone: [phone] Fax: [phone] ? ?[facility] [place] - [address] ?[address] ?[place]  [address] ?Phone: [phone] Fax: [phone] ? ? ? ? ?Social Determinants of Health (SDOH) Interventions ?  ? ?Readmission Risk Interventions ?   ? View : No data to display.  ?  ?  ?  ? ? ? ?

## 2022-02-24 NOTE — Evaluation (Signed)
Occupational Therapy Evaluation ?Patient Details ?Name: Laura Frey ?MRN: [id number] ?DOB: [birth year] ?Today's Date: 02/24/2022 ? ? ?History of Present Illness Pt admitted for pleural effusion with complaints of SOB symptoms. Pt with recent admission 4/30-5/8 with SNF stay. History includes anemia, GERD, HTN, depression, and PVD.  ? ?Clinical Impression ?  ?Laura Frey was seen for OT evaluation this date. Prior to hospital admission, pt was MOD I for limited mobility, assist from son for LBD. Pt lives with son available 24/7. Pt presents to acute OT demonstrating impaired ADL performance and functional mobility 2/2 decreased activity tolerance and functional strength/ROM/balance deficits. Pt currently requires MAX A don B socks seated EOB - assist required at baseline. MIN A + RW  sit<>stand - t/f completed by son with min cues from OT for hand placement. SETUP seated grooming tasksPt would benefit from skilled OT to address noted impairments and functional limitations (see below for any additional details). Upon hospital discharge, recommend HHOT to maximize pt safety and return to PLOF. ?  ? ?Recommendations for follow up therapy are one component of a multi-disciplinary discharge planning process, led by the attending physician.  Recommendations may be updated based on patient status, additional functional criteria and insurance authorization.  ? ?Follow Up Recommendations ? Home health OT  ?  ?Assistance Recommended at Discharge Frequent or constant Supervision/Assistance  ?Patient can return home with the following A lot of help with bathing/dressing/bathroom;Assistance with cooking/housework;Direct supervision/assist for medications management;Help with stairs or ramp for entrance;A lot of help with walking and/or transfers ? ?  ?Functional Status Assessment ? Patient has had a recent decline in their functional status and demonstrates the ability to make significant improvements in function in a reasonable  and predictable amount of time.  ?Equipment Recommendations ? Hospital bed  ?  ?Recommendations for Other Services   ? ? ?  ?Precautions / Restrictions Precautions ?Precautions: Fall ?Precaution Comments: jp drain gluteal ?Restrictions ?Weight Bearing Restrictions: No  ? ?  ? ?Mobility Bed Mobility ?Overal bed mobility: Needs Assistance ?Bed Mobility: Supine to Sit, Sit to Supine ?  ?  ?Supine to sit: Min guard ?Sit to supine: Mod assist ?  ?General bed mobility comments: son assisted return to bed ?  ? ?Transfers ?Overall transfer level: Needs assistance ?Equipment used: Rolling walker (2 wheels) ?Transfers: Sit to/from Stand ?Sit to Stand: Min assist ?  ?  ?  ?  ?  ?General transfer comment: son assisted ?  ? ?  ?Balance Overall balance assessment: Needs assistance ?Sitting-balance support: Feet supported ?Sitting balance-Leahy Scale: Good ?  ?  ?Standing balance support: Bilateral upper extremity supported ?Standing balance-Leahy Scale: Fair ?  ?  ?  ?  ?  ?  ?  ?  ?  ?  ?  ?  ?   ? ?ADL either performed or assessed with clinical judgement  ? ?ADL Overall ADL's : Needs assistance/impaired ?  ?  ?  ?  ?  ?  ?  ?  ?  ?  ?  ?  ?  ?  ?  ?  ?  ?  ?  ?General ADL Comments: MAX A don B socks seated EOB - assist required at baseline. MIN A + RW for ADL t/f - completed by son. SETUP seated grooming tasks  ? ? ? ? ?Pertinent Vitals/Pain Pain Assessment ?Pain Assessment: No/denies pain  ? ? ? ?Hand Dominance   ?  ?Extremity/Trunk Assessment Upper Extremity Assessment ?Upper Extremity Assessment: Overall WFL for  tasks assessed ?  ?Lower Extremity Assessment ?Lower Extremity Assessment: Generalized weakness ?  ?  ?  ?Communication Communication ?Communication: No difficulties ?  ?Cognition Arousal/Alertness: Awake/alert ?Behavior During Therapy: [facility] for tasks assessed/performed ?Overall Cognitive Status: Within Functional Limits for tasks assessed ?  ?  ?  ?  ?  ?  ?  ?  ?  ?  ?  ?  ?  ?  ?  ?  ?General Comments: alert and  oriented, easily distracted ?  ?  ?General Comments    ? ?  ?Exercises Exercises: Other exercises ?  ?Shoulder Instructions    ? ? ?Home Living Family/patient expects to be discharged to:: Private residence ?Living Arrangements: Children (adult son lives with her) ?Available Help at Discharge: Family;Available 24 hours/day ?Type of Home: House ?Home Access: Ramped entrance ?  ?  ?Home Layout: One level ?  ?  ?Bathroom Shower/Tub: Tub/shower unit;Curtain ?  ?  ?  ?  ?Home Equipment: Rollator (4 wheels);Grab bars - tub/shower;Hand held shower head;Shower seat ?  ?Additional Comments: was previously at SNF, pt/family prefer not to return ?  ? ?  ?Prior Functioning/Environment Prior Level of Function : Needs assist ?  ?  ?  ?  ?  ?  ?Mobility Comments: Uses a rollator for mobility, rarely out of the home. has been minimally mobile at SNF ?ADLs Comments: Pt.'s son assists pt. with ADLs, and IADLs, meal preparation, medication managements, and pillbox set-up. Pt. does not drive. ?  ? ?  ?  ?OT Problem List: Decreased strength;Decreased activity tolerance;Impaired balance (sitting and/or standing);Decreased range of motion;Decreased safety awareness ?  ?   ?OT Treatment/Interventions: Self-care/ADL training;Therapeutic exercise;DME and/or AE instruction;Patient/family education;Therapeutic activities;Neuromuscular education  ?  ?OT Goals(Current goals can be found in the care plan section) Acute Rehab OT Goals ?Patient Stated Goal: to go home ?OT Goal Formulation: With patient/family ?Time For Goal Achievement: 03/10/22 ?Potential to Achieve Goals: Good ?ADL Goals ?Pt Will Perform Grooming: with modified independence;sitting ?Pt Will Perform Lower Body Dressing: with min assist;sit to/from stand ?Pt Will Transfer to Toilet: with min guard assist;stand pivot transfer;bedside commode  ?OT Frequency: Min 2X/week ?  ? ?Co-evaluation   ?  ?  ?  ?  ? ?  ?AM-PAC OT "6 Clicks" Daily Activity     ?Outcome Measure Help from another  person eating meals?: None ?Help from another person taking care of personal grooming?: A Little ?Help from another person toileting, which includes using toliet, bedpan, or urinal?: A Lot ?Help from another person bathing (including washing, rinsing, drying)?: A Lot ?Help from another person to put on and taking off regular upper body clothing?: A Little ?Help from another person to put on and taking off regular lower body clothing?: A Lot ?6 Click Score: 16 ?  ?End of Session Equipment Utilized During Treatment: Rolling walker (2 wheels) ? ?Activity Tolerance: Patient tolerated treatment well ?Patient left:   ? ?OT Visit Diagnosis: Unsteadiness on feet (R26.81);Muscle weakness (generalized) (M62.81)  ?              ?Time: 3220-2542 ?OT Time Calculation (min): 15 min ?Charges:  OT General Charges ?$OT Visit: 1 Visit ?OT Evaluation ?$OT Eval Moderate Complexity: 1 Mod ? ?Dessie Coma, M.S. OTR/L  ?02/24/22, 4:33 PM  ?ascom [phone] ? ?

## 2022-02-24 NOTE — Plan of Care (Signed)
  Problem: Education: Goal: Knowledge of General Education information will improve Description: Including pain rating scale, medication(s)/side effects and non-pharmacologic comfort measures Outcome: Progressing   Problem: Health Behavior/Discharge Planning: Goal: Ability to manage health-related needs will improve Outcome: Progressing   Problem: Clinical Measurements: Goal: Ability to maintain clinical measurements within normal limits will improve Outcome: Progressing Goal: Respiratory complications will improve Outcome: Progressing   Problem: Nutrition: Goal: Adequate nutrition will be maintained Outcome: Progressing   Problem: Elimination: Goal: Will not experience complications related to urinary retention Outcome: Progressing   Problem: Pain Managment: Goal: General experience of comfort will improve Outcome: Progressing   Problem: Safety: Goal: Ability to remain free from injury will improve Outcome: Progressing   Problem: Skin Integrity: Goal: Risk for impaired skin integrity will decrease Outcome: Progressing   

## 2022-02-24 NOTE — Consult Note (Addendum)
ADDENDUM 12:58 PM ?CT Abdomen/Pelvis reviewed, new left lower quadrant air/fluid collection with possible fistula, previous pelvic fluid collection appears resolved. Discussed findings with IR. Will plan on placement of percutaneous drain in new LLQ air fluid collection tomorrow. Will also plan to study gluteal drain. Orders placed. NPO at midnight.  ? ? ? ?[facility] ?SURGICAL CONSULTATION NOTE (initial) - cpt: 59563 ? ? ?HISTORY OF PRESENT ILLNESS (HPI):  ?88 y.o. female known to our service following recent admission from 04/30 - 05/08 for diverticulitis with abscess requiring drain placement on 05/04. Cx from this were without growth. She was discharged to [facility]. She unfortunately presents again to the ED on 05/10 for SOB. Notes indicate patient was having some wheezing while eating breakfast this morning. She has a history of similar and is treated with nebulizer. Symptoms have been persistent over the last few days. No fever, chills, CP. Additionally, it appears some of her complaints on arrival where with the facility itself as well. From an abdominal standpoint, she denied any abdominal pain. Drain with serous output. She is having bowel function.  Work up in the ED revealed a leukocytosis to 14.0K (now 11.8K), renal function normal with sCr - 0.57. She did have CXR concerning for new left pleural effusion. She was admitted to the medicine service and her Augmentin (which she was discharged with) was continued. Son did request our evaluation for drain removal.   ? ?Surgery is consulted by hospitalist physician Dr. Max Sane, MD in this context for evaluation and management of diverticulitis with drain placed on 05/04. ? ? ?PAST MEDICAL HISTORY (PMH):  ?Past Medical History:  ?Diagnosis Date  ? Anemia   ? hx Thalassemia minor anemia  ? Asthma   ? Cancer [facility])   ? melanoma on back  ? Chronic back pain   ? ESI/uses BC powders  ? Depression   ? GERD (gastroesophageal reflux disease)    ? Headache(784.0)   ? Hypertension   ? Hypothyroidism   ? Peripheral vascular disease ([place])   ? legs  ? Sepsis ([place]) 01/27/2014  ? Shortness of breath   ? Tendinitis of both rotator cuffs   ? UTI (urinary tract infection)   ?  ? ?PAST SURGICAL HISTORY ([place]):  ?Past Surgical History:  ?Procedure Laterality Date  ? ABDOMINAL HYSTERECTOMY    ? vagina  ? ANTERIOR CERVICAL DECOMP/DISCECTOMY FUSION N/A 05/01/2013  ? Procedure: ANTERIOR CERVICAL DECOMPRESSION/DISCECTOMY FUSION 3 LEVELS;  Surgeon: Elaina Hoops, MD;  Location: [facility];  Service: Neurosurgery;  Laterality: N/A;  ANTERIOR CERVICAL DECOMPRESSION/DISCECTOMY FUSION 3 LEVELS  ? APPENDECTOMY    ? BLADDER REPAIR    ? after hysterectomy same day  ? EYE SURGERY Bilateral   ? cataracts  ? TONSILLECTOMY    ? TUBAL LIGATION    ?  ? ?MEDICATIONS:  ?Prior to Admission medications   ?Medication Sig Start Date End Date Taking? Authorizing Provider  ?amLODipine (NORVASC) 10 MG tablet Take 10 mg by mouth daily. 02/08/22  Yes [provider]  ?amoxicillin-clavulanate (AUGMENTIN) 875-125 MG tablet Take 1 tablet by mouth 2 (two) times daily for 5 days. 02/21/22 02/26/22 Yes Lorella Nimrod, MD  ?Cholecalciferol 10 MCG (400 UNIT) CAPS Take by mouth. Take by mouth daily.   Yes [provider]  ?DULoxetine (CYMBALTA) 60 MG capsule  05/14/18  Yes [provider]  ?levothyroxine (SYNTHROID, LEVOTHROID) 75 MCG tablet Take 75 mcg by mouth daily before breakfast.   Yes [provider]  ?  magnesium oxide (MAG-OX) 400 MG tablet Take 400 mg by mouth daily.   Yes [provider]  ?metoprolol (TOPROL-XL) 200 MG 24 hr tablet Take 200 mg by mouth daily.   Yes [provider]  ?Multiple Vitamin (MULTIVITAMIN WITH MINERALS) TABS Take 1 tablet by mouth daily.   Yes [provider]  ?omeprazole (PRILOSEC) 20 MG capsule Take 20 mg by mouth daily.   Yes [provider]  ?VITAMIN E PO Take 500 Units by mouth daily.   Yes [provider]  ?albuterol (PROVENTIL HFA;VENTOLIN HFA) 108 (90 BASE) MCG/ACT inhaler Inhale 2 puffs into the lungs every 6 (six) hours as needed for wheezing. ?Patient not taking: Reported on 02/23/2022    [provider]  ?Aspirin-Caffeine 1000-65 MG PACK Take 1 packet by mouth 2 (two) times daily.    [provider]  ?fexofenadine (ALLEGRA) 180 MG tablet Take 180 mg by mouth daily.    [provider]  ?magnesium hydroxide (MILK OF MAGNESIA) 400 MG/5ML suspension Take 30 mLs by mouth at bedtime as needed for mild constipation. 02/21/22   Lorella Nimrod, MD  ?  ? ?ALLERGIES:  ?Allergies  ?Allergen Reactions  ? Ciprofloxacin Swelling and Rash  ? Hydrocodone   ?  hallucinations  ? Oxycodone   ?  hallucinations  ? Zoloft [Sertraline Hcl]   ?  Burns stomach  ? Amoxicillin Diarrhea  ? Sertraline Other (See Comments)  ?  "Burns stomach"  ? Sulfa Antibiotics   ?  Blisters in mouth  ? Trazodone   ?  Other reaction(s): Unknown  ? Tylenol [Acetaminophen]   ?  headaches  ? Ultram [Tramadol]   ?  headaches  ? Cefuroxime Axetil Rash  ?  ? ?SOCIAL HISTORY:  ?Social History  ? ?Socioeconomic History  ? Marital status: Widowed  ?  Spouse name: Not on file  ? Number of children: Not on file  ? Years of education: Not on file  ? Highest education level: Not on file  ?Occupational History  ? Not on file  ?Tobacco Use  ? Smoking status: Former  ?  Packs/day: 1.00  ?  Years: 30.00  ?  Pack years: 30.00  ?  Types: Cigarettes  ?  Quit date: 04/30/1995  ?  Years since quitting: 26.8  ? Smokeless tobacco: Never  ?Substance and Sexual Activity  ? Alcohol use: No  ? Drug use: No  ? Sexual activity: Not Currently  ?Other Topics Concern  ? Not on file  ?Social History Narrative  ? Not on file  ? ?Social Determinants of Health  ? ?Financial Resource Strain: Not on file  ?Food Insecurity: Not on file  ?Transportation Needs: Not on file  ?Physical Activity: Not on file  ?Stress: Not on file  ?Social Connections: Not on file   ?Intimate Partner Violence: Not on file  ?  ? ?FAMILY HISTORY:  ?Family History  ?Problem Relation Age of Onset  ? Thalassemia Son   ? Thalassemia Paternal Aunt   ?  ? ? ?REVIEW OF SYSTEMS:  ?Review of Systems  ?Constitutional:  Negative for chills and fever.  ?HENT:  Negative for congestion and sore throat.   ?Respiratory:  Positive for cough, shortness of breath and wheezing.   ?Cardiovascular:  Negative for chest pain and palpitations.  ?Gastrointestinal:  Negative for abdominal pain, diarrhea, nausea and vomiting.  ?Genitourinary:  Negative for dysuria and urgency.  ?All other systems reviewed and are negative. ? ?VITAL SIGNS:  ?Temp:  [  98 ?F (36.7 ?C)-98.8 ?F (37.1 ?C)] 98 ?F (36.7 ?C) (05/11 0932) ?Pulse Rate:  [76-102] 102 (05/11 0433) ?Resp:  [14-19] 19 (05/11 0433) ?BP: (135-143)/(63-76) 142/68 (05/11 0433) ?SpO2:  [95 %-98 %] 97 % (05/11 0433) ?Weight:  [85 kg] 85 kg (05/10 0923)     Height: '5\' 7"'$  (170.2 cm) Weight: 85 kg BMI (Calculated): 29.34  ? ?INTAKE/OUTPUT:  ?05/10 0701 - 05/11 0700 ?In: -  ?Out: 950 [Urine:950] ? ?PHYSICAL EXAM:  ?Physical Exam ?Vitals and nursing note reviewed.  ?Constitutional:   ?   General: She is not in acute distress. ?   Appearance: She is well-developed. She is not ill-appearing.  ?   Interventions: Nasal cannula in place.  ?   Comments: Patient resting in bed, she objectively looks the best I have seen her, NAD  ?HENT:  ?   Head: Normocephalic and atraumatic.  ?Cardiovascular:  ?   Rate and Rhythm: Normal rate.  ?   Pulses: Normal pulses.  ?Pulmonary:  ?   Effort: Pulmonary effort is normal. No tachypnea.  ?Abdominal:  ?   General: Abdomen is flat. There is no distension.  ?   Palpations: Abdomen is soft.  ?   Tenderness: There is no abdominal tenderness.  ?   Comments: Abdomen is soft, non-tender, non-distended, no rebound/guarding. She is certainly without peritonitis  ?Genitourinary: ?   Comments: Surgical drain in right buttock; output serous  ?Musculoskeletal:  ?    Right lower leg: No edema.  ?   Left lower leg: No edema.  ?Skin: ?   General: Skin is warm and dry.  ?   Coloration: Skin is not pale.  ?   Findings: No erythema.  ?Neurological:  ?   General: No focal de

## 2022-02-24 NOTE — H&P (Addendum)
Chief Complaint: Patient was seen in consultation today for intra-abdominal fluid collection  at the request of Lynden Oxford, [place]  Referring Physician(s): Lynden Oxford, [place]  Supervising Physician: Richarda Overlie  Patient Status: [facility] - In-pt  History of Present Illness: Laura Frey is a 88 y.o. female well-known to IR service having had transgluteal pelvic abscess drain placed on 02/17/2022.  Surgery ordered CT abdomen pelvis 02/24/2022 found that previous pelvic fluid collection has mostly resolved but new left lower quadrant air fluid collection with possible fistula has developed.  Lynden Oxford, PA has referred patient to IR once again to place percutaneous pelvic fluid collection drain.  This procedure was approved by Dr. Bryn Gulling, IR.  Procedure is tentatively scheduled for 02/25/2022.  CT abdomen pelvis 02/24/2022: IMPRESSION: 1. Again noted is sequelae of recent acute sigmoid diverticulitis. There is a new extraluminal collection of gas and trace fluid in the left lower quadrant of the abdomen abutting the undersurface of the ventral abdominal wall measuring 5.4 x 3.0 cm. There is associated fistulous communication with the nearby inflamed segment of sigmoid colon. 2. Decrease in size of right posterior pelvic fluid collection status post percutaneous drainage catheter placement. 3. Increase in volume of left pleural effusion with overlying compressive type atelectasis of the left lower lobe. 4. Interval decrease in pneumoperitoneum. 5. Chronic right middle lobe collapse. 6. Gallstones. Mild gallbladder wall thickening, which is nonspecific and may reflect incomplete distension. If there is a clinical concern for cholecystitis right upper quadrant sonogram is advised.   Past Medical History:  Diagnosis Date   Anemia    hx Thalassemia minor anemia   Asthma    Cancer (HCC)    melanoma on back   Chronic back pain    ESI/uses BC powders   Depression    GERD  (gastroesophageal reflux disease)    Headache(784.0)    Hypertension    Hypothyroidism    Peripheral vascular disease (HCC)    legs   Sepsis (HCC) 01/27/2014   Shortness of breath    Tendinitis of both rotator cuffs    UTI (urinary tract infection)     Past Surgical History:  Procedure Laterality Date   ABDOMINAL HYSTERECTOMY     vagina   ANTERIOR CERVICAL DECOMP/DISCECTOMY FUSION N/A 05/01/2013   Procedure: ANTERIOR CERVICAL DECOMPRESSION/DISCECTOMY FUSION 3 LEVELS;  Surgeon: Mariam Dollar, MD;  Location: [facility];  Service: Neurosurgery;  Laterality: N/A;  ANTERIOR CERVICAL DECOMPRESSION/DISCECTOMY FUSION 3 LEVELS   APPENDECTOMY     BLADDER REPAIR     after hysterectomy same day   EYE SURGERY Bilateral    cataracts   TONSILLECTOMY     TUBAL LIGATION      Allergies: Ciprofloxacin, Hydrocodone, Oxycodone, Zoloft [sertraline hcl], Amoxicillin, Sertraline, Sulfa antibiotics, Trazodone, Tylenol [acetaminophen], Ultram [tramadol], and Cefuroxime axetil  Medications: Prior to Admission medications   Medication Sig Start Date End Date Taking? Authorizing Provider  amLODipine (NORVASC) 10 MG tablet Take 10 mg by mouth daily. 02/08/22  Yes [provider]  amoxicillin-clavulanate (AUGMENTIN) 875-125 MG tablet Take 1 tablet by mouth 2 (two) times daily for 5 days. 02/21/22 02/26/22 Yes Arnetha Courser, MD  Cholecalciferol 10 MCG (400 UNIT) CAPS Take by mouth. Take by mouth daily.   Yes [provider]  DULoxetine (CYMBALTA) 60 MG capsule  05/14/18  Yes [provider]  levothyroxine (SYNTHROID, LEVOTHROID) 75 MCG tablet Take 75 mcg by mouth daily before breakfast.   Yes [provider]  magnesium oxide (MAG-OX)  400 MG tablet Take 400 mg by mouth daily.   Yes [provider]  metoprolol (TOPROL-XL) 200 MG 24 hr tablet Take 200 mg by mouth daily.   Yes [provider]  Multiple Vitamin (MULTIVITAMIN WITH MINERALS) TABS Take 1 tablet by mouth  daily.   Yes [provider]  omeprazole (PRILOSEC) 20 MG capsule Take 20 mg by mouth daily.   Yes [provider]  VITAMIN E PO Take 500 Units by mouth daily.   Yes [provider]  albuterol (PROVENTIL HFA;VENTOLIN HFA) 108 (90 BASE) MCG/ACT inhaler Inhale 2 puffs into the lungs every 6 (six) hours as needed for wheezing. Patient not taking: Reported on 02/23/2022    [provider]  Aspirin-Caffeine 1000-65 MG PACK Take 1 packet by mouth 2 (two) times daily.    [provider]  fexofenadine (ALLEGRA) 180 MG tablet Take 180 mg by mouth daily.    [provider]  magnesium hydroxide (MILK OF MAGNESIA) 400 MG/5ML suspension Take 30 mLs by mouth at bedtime as needed for mild constipation. 02/21/22   Arnetha Courser, MD     Family History  Problem Relation Age of Onset   Thalassemia Son    Thalassemia Paternal Aunt     Social History   Socioeconomic History   Marital status: Widowed    Spouse name: Not on file   Number of children: Not on file   Years of education: Not on file   Highest education level: Not on file  Occupational History   Not on file  Tobacco Use   Smoking status: Former    Packs/day: 1.00    Years: 30.00    Pack years: 30.00    Types: Cigarettes    Quit date: 04/30/1995    Years since quitting: 26.8   Smokeless tobacco: Never  Substance and Sexual Activity   Alcohol use: No   Drug use: No   Sexual activity: Not Currently  Other Topics Concern   Not on file  Social History Narrative   Not on file   Social Determinants of Health   Financial Resource Strain: Not on file  Food Insecurity: Not on file  Transportation Needs: Not on file  Physical Activity: Not on file  Stress: Not on file  Social Connections: Not on file    Review of Systems: A 12 point ROS discussed and pertinent positives are indicated in the HPI above.  All other systems are negative.  Review of Systems  Constitutional:  Negative for  appetite change, chills and fever.  Respiratory:  Positive for shortness of breath.   Cardiovascular:  Negative for chest pain and leg swelling.  Gastrointestinal:  Negative for abdominal pain.  Musculoskeletal:  Positive for arthralgias.   Vital Signs: BP (!) 145/78 (BP Location: Left Arm)   Pulse 94   Temp (!) 97.5 F (36.4 C)   Resp 18   Ht 5\' 7"  (1.702 m)   Wt 174 lb 13.2 oz (79.3 kg)   SpO2 97%   BMI 27.38 kg/m   Physical Exam Vitals reviewed.  Constitutional:      General: She is not in acute distress.    Appearance: She is ill-appearing.  HENT:     Head: Normocephalic and atraumatic.     Mouth/Throat:     Mouth: Mucous membranes are dry.     Pharynx: Oropharynx is clear.  Eyes:     Extraocular Movements: Extraocular movements intact.     Pupils: Pupils are equal, round,  and reactive to light.  Cardiovascular:     Rate and Rhythm: Regular rhythm. Tachycardia present.     Pulses: Normal pulses.     Heart sounds: Normal heart sounds.  Pulmonary:     Effort: Pulmonary effort is normal. No respiratory distress.  Abdominal:     General: Bowel sounds are normal. There is no distension.     Tenderness: There is no abdominal tenderness. There is no guarding.  Musculoskeletal:     Right lower leg: No edema.     Left lower leg: No edema.  Skin:    General: Skin is warm and dry.     Comments: Right transgluteal drain in place. There is irritation and redness at insertion site. Scant amt cloudy, yellow fluid in JP drain. Sutures/statlock in place. Drain flushes easily.   Neurological:     Mental Status: She is alert and oriented to person, place, and time.  Psychiatric:        Mood and Affect: Mood normal.        Behavior: Behavior normal.        Thought Content: Thought content normal.        Judgment: Judgment normal.    Imaging: DG Chest 2 View  Result Date: 02/23/2022 CLINICAL DATA:  Dyspnea, RIGHT hip pain for 1 week after slipping on the commode EXAM: CHEST -  2 VIEW COMPARISON:  02/13/2022 FINDINGS: Normal heart size, mediastinal contours, and pulmonary vascularity. Atherosclerotic calcification aorta. Mild RIGHT basilar atelectasis. Small LEFT pleural effusion and accompanying LEFT basilar atelectasis. Chronic central peribronchial thickening. Upper lungs clear. No pneumothorax. Bones demineralized with note of prior cervical spine fusion. IMPRESSION: New LEFT pleural effusion and basilar atelectasis. Chronic bronchitic changes with subsegmental atelectasis at RIGHT base. Aortic Atherosclerosis (ICD10-I70.0). Electronically Signed   By: Ulyses Southward M.D.   On: 02/23/2022 10:20   CT Angio Chest Pulmonary Embolism (PE) W or WO Contrast  Result Date: 02/14/2022 CLINICAL DATA:  Pulmonary embolism suspected, high probability EXAM: CT ANGIOGRAPHY CHEST WITH CONTRAST TECHNIQUE: Multidetector CT imaging of the chest was performed using the standard protocol during bolus administration of intravenous contrast. Multiplanar CT image reconstructions and MIPs were obtained to evaluate the vascular anatomy. RADIATION DOSE REDUCTION: This exam was performed according to the departmental dose-optimization program which includes automated exposure control, adjustment of the mA and/or kV according to patient size and/or use of iterative reconstruction technique. CONTRAST:  50mL OMNIPAQUE IOHEXOL 350 MG/ML SOLN COMPARISON:  Abdominal CT from yesterday.  Chest CT 02/05/2014 FINDINGS: Cardiovascular: Satisfactory opacification of the pulmonary arteries to the segmental level. No evidence of pulmonary embolism. Normal heart size. No pericardial effusion. Probable left ventricular and pulmonary outflow wall thickening. Aortic and coronary atheromatous calcification. Mediastinum/Nodes: Negative for adenopathy. Small sliding hiatal hernia with lower esophageal thickening which was also seen previously. Lungs/Pleura: Chronic right middle lobe collapse with blunted vessel enhancement. Mild  atelectatic densities bilaterally. There is no edema, consolidation, effusion, or pneumothorax. Upper Abdomen: Reference preceding abdominal CT; known pneumoperitoneum. Musculoskeletal: Bulky spondylosis with multilevel spurring. Review of the MIP images confirms the above findings. IMPRESSION: Negative for pulmonary embolism. Electronically Signed   By: Tiburcio Pea M.D.   On: 02/14/2022 06:57   CT ABDOMEN PELVIS W CONTRAST  Result Date: 02/16/2022 CLINICAL DATA:  Follow-up perforated diverticulitis. Worsening abdominal pain. EXAM: CT ABDOMEN AND PELVIS WITH CONTRAST TECHNIQUE: Multidetector CT imaging of the abdomen and pelvis was performed using the standard protocol following bolus administration of intravenous contrast. RADIATION DOSE REDUCTION:  This exam was performed according to the departmental dose-optimization program which includes automated exposure control, adjustment of the mA and/or kV according to patient size and/or use of iterative reconstruction technique. CONTRAST:  OMNIPAQUE IOHEXOL 300 MG/ML  SOLN COMPARISON:  02/13/2022 FINDINGS: Lower Chest: New small left pleural effusion and left lower lobe atelectasis. Hepatobiliary: No hepatic masses identified. Multiple tiny less than 5 mm gallstones are again seen. Gallbladder is distended, but there are no other findings of acute cholecystitis or biliary ductal dilatation. Pancreas:  No mass or inflammatory changes. Spleen: Within normal limits in size and appearance. Adrenals/Urinary Tract: No masses identified. No evidence of ureteral calculi or hydronephrosis. Stomach/Bowel: Free intraperitoneal air is again seen but mildly decreased since previous study. Moderate diverticulitis is again seen involving the descending colon, without significant change since prior study. A new rim enhancing fluid collection is seen in the pelvic cul-de-sac which measures 4.8 x 4.1 cm, consistent with abscess. Small hiatal hernia again noted.  Vascular/Lymphatic: No pathologically enlarged lymph nodes. No acute vascular findings. Aortic atherosclerotic calcification noted. Reproductive: Prior hysterectomy noted. Adnexal regions are unremarkable in appearance. Other:  None. Musculoskeletal: No suspicious bone lesions identified. Old L2 and L3 vertebral body compression fractures again noted. IMPRESSION: Perforated diverticulitis involving the descending colon shows no significant change since previous study. Mild decrease in free intraperitoneal air since prior exam. New 4.8 cm rim enhancing fluid collection in pelvic cul-de-sac, consistent with abscess. New small left pleural effusion and left lower lobe atelectasis. Cholelithiasis. Gallbladder is distended, which may be due to NPO status. No other findings of acute cholecystitis. Stable small hiatal hernia. Aortic Atherosclerosis (ICD10-I70.0). Electronically Signed   By: Danae Orleans M.D.   On: 02/16/2022 11:55   CT ABDOMEN PELVIS W CONTRAST  Result Date: 02/13/2022 CLINICAL DATA:  Free air under the right hemidiaphragm on recent chest x-ray EXAM: CT ABDOMEN AND PELVIS WITH CONTRAST TECHNIQUE: Multidetector CT imaging of the abdomen and pelvis was performed using the standard protocol following bolus administration of intravenous contrast. RADIATION DOSE REDUCTION: This exam was performed according to the departmental dose-optimization program which includes automated exposure control, adjustment of the mA and/or kV according to patient size and/or use of iterative reconstruction technique. CONTRAST:  OMNIPAQUE IOHEXOL 300 MG/ML  SOLN COMPARISON:  Chest x-ray from earlier in the same day. FINDINGS: Lower chest: Lung bases show no focal infiltrate or sizable effusion. Hepatobiliary: Liver is well visualized and within normal limits. Gallbladder is well distended with multiple gallstones within. No biliary ductal dilatation is seen. Pancreas: Unremarkable. No pancreatic ductal dilatation or  surrounding inflammatory changes. Spleen: Normal in size without focal abnormality. Adrenals/Urinary Tract: Adrenal glands are within normal limits. Kidneys demonstrate a normal enhancement pattern bilaterally. Subcentimeter hypodensity is noted in the right kidney inferiorly likely representing a small cyst. No follow-up is recommended. No obstructive changes are seen. The bladder is partially distended. Stomach/Bowel: Colon shows evidence of diverticular change is well as diverticulitis at the junction of the descending and sigmoid colons. Some small foci of extraluminal air are noted adjacent to the area of diverticulitis as well as more significant air in the more superior aspect of the abdominal cavity similar to that seen on recent chest x-ray. These changes are most consistent with perforated diverticulitis. No abscess is noted at this time. More proximal colon appears within normal limits. The appendix has been surgically removed. Small bowel and stomach are within normal limits with the exception of a hiatal hernia. Vascular/Lymphatic: Aortic atherosclerosis.  No enlarged abdominal or pelvic lymph nodes. Reproductive: Status post hysterectomy. No adnexal masses. Other: No abdominal wall hernia or abnormality. No abdominopelvic ascites. Musculoskeletal: Degenerative changes of lumbar spine are noted. Chronic L2 and L3 compression fractures are noted. IMPRESSION: Changes consistent with acute diverticulitis with perforation and considerable free intraperitoneal air. This corresponds with that seen on recent chest x-ray. Cholelithiasis without complicating factors. Hiatal hernia. Electronically Signed   By: Alcide Clever M.D.   On: 02/13/2022 22:11   CT CHEST ABDOMEN PELVIS W CONTRAST  Result Date: 02/24/2022 CLINICAL DATA:  Concern for diverticulitis with abscess.  Sepsis. EXAM: CT CHEST, ABDOMEN, AND PELVIS WITH CONTRAST TECHNIQUE: Multidetector CT imaging of the chest, abdomen and pelvis was performed  following the standard protocol during bolus administration of intravenous contrast. RADIATION DOSE REDUCTION: This exam was performed according to the departmental dose-optimization program which includes automated exposure control, adjustment of the mA and/or kV according to patient size and/or use of iterative reconstruction technique. CONTRAST:  OMNIPAQUE IOHEXOL 300 MG/ML  SOLN COMPARISON:  CT AP 02/16/2022.  CT chest 02/14/2022. FINDINGS: CT CHEST FINDINGS Cardiovascular: There is mild cardiac enlargement. Aortic atherosclerosis and coronary artery calcifications. No pericardial effusion identified. Mediastinum/Nodes: Thyroid gland, trachea, and esophagus demonstrate no significant findings. No axillary or supraclavicular lymph nodes. Prominent mediastinal lymph nodes are identified, none of which meet CT criteria for adenopathy. These appears similar when compared with the previous exam. Lungs/Pleura: Moderate left pleural effusion is increased in volume from previous exam with overlying compressive type atelectasis of the left lower lobe. Mild pleural thickening overlying the posterior right lower lobe is similar. Chronic right middle lobe collapse. Unchanged. Centrilobular emphysema. No signs of airspace consolidation to suggest pneumonia. Musculoskeletal: Thoracic degenerative disc disease. No acute or suspicious osseous findings. CT ABDOMEN PELVIS FINDINGS Hepatobiliary: No focal liver abnormality. Stones are identified layering within the gallbladder measuring up to 4 mm. Partially decompressed gallbladder with mild wall thickening and enhancement, nonspecific. Common bile duct is normal in caliber measuring up to 5 mm. No intrahepatic bile duct dilatation. Pancreas: No inflammation, main duct dilatation or mass identified. There is similar appearance of atrophy involving the distal tail of pancreas. Spleen: Normal in size without focal abnormality. Adrenals/Urinary Tract: Normal adrenal glands. No  nephrolithiasis, hydronephrosis or kidney mass identified. Bladder is unremarkable. Stomach/Bowel: Small hiatal hernia. Patient is status post appendectomy. Chronic diverticulosis is identified throughout the colon and is most severe at the level of the sigmoid colon. Wall thickening with surrounding inflammatory fat stranding is again noted involving the proximal sigmoid colon. Anterior and slightly midline to the sigmoid colon is an extraluminal collection of gas and trace amount of fluid which abutting the undersurface of the left lower quadrant ventral abdominal wall. This measures 5.4 x 3.0 cm, image 90/2. There is fistulous communication of this predominantly gas containing collection via gas communicates with the inflamed segment of sigmoid colon. Fistula tract contains gas measuring approximately 3.2 cm in length, image 92/2. Percutaneous drainage catheter is identified entering from a right medial gluteal approach with pigtail seen on image 100/2. The previous right posterior pelvic fluid collection measures 2.4 x 1.7 cm, image 103/2. This is compared with 4.1 x 4.7 cm previously. Vascular/Lymphatic: Aortic atherosclerosis. No aneurysm. No signs of abdominopelvic adenopathy. Reproductive: Status post hysterectomy. No adnexal masses. Other: There are a few scattered foci of pneumoperitoneum within the abdomen for example, within the left anterior abdomen, image 74/2 but this is decreased overall when compared with the previous  exam. Musculoskeletal: Chronic appearing superior endplate deformities are again noted L2 and L3. No acute or suspicious osseous findings. IMPRESSION: 1. Again noted is sequelae of recent acute sigmoid diverticulitis. There is a new extraluminal collection of gas and trace fluid in the left lower quadrant of the abdomen abutting the undersurface of the ventral abdominal wall measuring 5.4 x 3.0 cm. There is associated fistulous communication with the nearby inflamed segment of sigmoid  colon. 2. Decrease in size of right posterior pelvic fluid collection status post percutaneous drainage catheter placement. 3. Increase in volume of left pleural effusion with overlying compressive type atelectasis of the left lower lobe. 4. Interval decrease in pneumoperitoneum. 5. Chronic right middle lobe collapse. 6. Gallstones. Mild gallbladder wall thickening, which is nonspecific and may reflect incomplete distension. If there is a clinical concern for cholecystitis right upper quadrant sonogram is advised. 7. Aortic Atherosclerosis (ICD10-I70.0) and Emphysema (ICD10-J43.9). These results will be called to the ordering clinician or representative by the Radiologist Assistant, and communication documented in the PACS or [employer]. Electronically Signed   By: Signa Kell M.D.   On: 02/24/2022 11:21   US Venous Img Lower Bilateral (DVT)  Result Date: 02/14/2022 CLINICAL DATA:  Bilateral edema EXAM: BILATERAL LOWER EXTREMITY VENOUS DOPPLER ULTRASOUND TECHNIQUE: Gray-scale sonography with compression, as well as color and duplex ultrasound, were performed to evaluate the deep venous system(s) from the level of the common femoral vein through the popliteal and proximal calf veins. COMPARISON:  03/26/2002 by report only FINDINGS: VENOUS Normal compressibility of the common femoral, superficial femoral, and popliteal veins, as well as the visualized calf veins. Visualized portions of profunda femoral vein and great saphenous vein unremarkable. No filling defects to suggest DVT on grayscale or color Doppler imaging. Doppler waveforms show normal direction of venous flow, normal respiratory plasticity and response to augmentation. OTHER None. Limitations: none IMPRESSION: Negative. Electronically Signed   By: Corlis Leak M.D.   On: 02/14/2022 08:23   DG Chest Portable 1 View  Result Date: 02/13/2022 CLINICAL DATA:  Hypoxia EXAM: PORTABLE CHEST 1 VIEW COMPARISON:  02/05/2014 FINDINGS: Cardiac shadow is  within normal limits. Aortic calcifications are noted. The lungs are well aerated bilaterally. No focal infiltrate or sizable effusion is seen. There is air beneath the right hemidiaphragm on this frontal film. Postsurgical changes are noted in the cervical spine. Degenerative change of thoracic spine is noted. IMPRESSION: Free air beneath the right hemidiaphragm. CT of the abdomen and pelvis is recommended for further evaluation. Critical Value/emergent results were called by telephone at the time of interpretation on 02/13/2022 at 8:25 pm to [facility] , who verbally acknowledged these results. Electronically Signed   By: Alcide Clever M.D.   On: 02/13/2022 20:28   DG HIP UNILAT WITH PELVIS 2-3 VIEWS RIGHT  Result Date: 02/23/2022 CLINICAL DATA:  RIGHT hip pain for 1 week after slipping on the commode EXAM: DG HIP (WITH OR WITHOUT PELVIS) 2-3V RIGHT COMPARISON:  None FINDINGS: Osseous demineralization. Hip and SI joint spaces preserved. No acute fracture, dislocation, or bone destruction. Lateral soft tissue swelling at the level of the hip. Pigtail drainage catheter in RIGHT pelvis. Diverticulosis of descending and sigmoid colon. IMPRESSION: Osseous demineralization without acute bony findings. Colonic diverticulosis. Electronically Signed   By: Ulyses Southward M.D.   On: 02/23/2022 10:21   CT IMAGE GUIDED DRAINAGE BY PERCUTANEOUS CATHETER  Result Date: 02/17/2022 INDICATION: Pelvic abscess, perforated diverticulitis EXAM: CT-guided drain placement of pelvic fluid collection TECHNIQUE: Multidetector  CT imaging of the pelvis was performed following the standard protocol without IV contrast. RADIATION DOSE REDUCTION: This exam was performed according to the departmental dose-optimization program which includes automated exposure control, adjustment of the mA and/or kV according to patient size and/or use of iterative reconstruction technique. MEDICATIONS: The patient is currently admitted to the hospital  and receiving intravenous antibiotics. The antibiotics were administered within an appropriate time frame prior to the initiation of the procedure. ANESTHESIA/SEDATION: Local analgesia COMPLICATIONS: None immediate. PROCEDURE: Informed written consent was obtained from the patient after a thorough discussion of the procedural risks, benefits and alternatives. All questions were addressed. Maximal Sterile Barrier Technique was utilized including caps, mask, sterile gowns, sterile gloves, sterile drape, hand hygiene and skin antiseptic. A timeout was performed prior to the initiation of the procedure. The patient was placed prone on the exam table. Limited CT of the pelvis was performed for planning purposes. This demonstrated a perirectal fluid collection in the lower posterior pelvis. Skin entry site was marked, the overlying skin was prepped and draped in a standard sterile fashion. Local analgesia was obtained with 1% lidocaine. Using intermittent CT fluoroscopy, a 19 gauge Yueh catheter was advanced into the fluid collection. Location was confirmed with CT and return of purulent material. Over an 035 wire, the tract was serially dilated and a 12 French locking multipurpose drainage catheter was advanced into the fluid collection. Location was confirmed with CT an additional return of purulent material. Sample was sent to the lab for analysis. Locking loop was formed, the catheter was secured to skin using silk suture and a dressing. It was attached to bulb suction. The patient tolerated the procedure well without immediate complication. IMPRESSION: Successful CT-guided placement of a 12 French drainage catheter into pelvic fluid collection via a right transgluteal approach. Electronically Signed   By: Olive Bass M.D.   On: 02/17/2022 14:06    Labs:  CBC: Recent Labs    02/17/22 0535 02/18/22 0454 02/23/22 0928 02/24/22 0410  WBC 7.4 6.5 14.0* 11.8*  HGB 10.6* 10.1* 11.0* 10.5*  HCT 33.6* 33.0*  35.3* 33.8*  PLT 256 270 387 419*    COAGS: No results for input(s): INR, APTT in the last 8760 hours.  BMP: Recent Labs    02/19/22 0629 02/20/22 0507 02/23/22 0928 02/24/22 0410  NA 138 139 134* 137  K 3.5 3.6 4.5 4.5  CL 102 104 96* 98  CO2 30 30 32 33*  GLUCOSE 107* 109* 119* 169*  BUN 5* <5* 5* 10  CALCIUM 8.6* 8.3* 8.6* 8.8*  CREATININE 0.55 0.56 0.57 0.62  GFRNONAA >60 >60 >60 >60    LIVER FUNCTION TESTS: Recent Labs    02/13/22 2045 02/14/22 0523  BILITOT 1.2 1.3*  AST 29 20  ALT 18 16  ALKPHOS 79 66  PROT 7.1 6.3*  ALBUMIN 3.9 3.5    TUMOR MARKERS: No results for input(s): AFPTM, CEA, CA199, CHROMGRNA in the last 8760 hours.  Assessment and Plan: Previous transgluteal pelvic abscess drain placed on 02/17/2022.  Surgery ordered CT abdomen pelvis 02/24/2022 that found previous pelvic fluid collection has mostly resolved but new left lower quadrant air fluid collection with possible fistula has developed.  Lynden Oxford, PA has referred patient to IR once again to place percutaneous pelvic fluid collection drain.  This procedure was approved by Dr. Bryn Gulling, IR.  Procedure is tentatively scheduled for 02/25/2022.  Pt resting in bed reading dinner menu with son at bedside.  She is A&O,  calm and pleasant.  Pt understands that she needs to be NPO after MN.  Lovenox held.   *Right transgluteal drain in place from 02/17/22. There is irritation and redness at insertion site. Scant amt cloudy, yellow fluid in JP drain. Sutures/statlock in place. Drain flushes easily. Pt and son requests that previous pelvic drain be assessed and possible removed during IR procedure.    CT abdomen pelvis 02/24/2022: IMPRESSION: 1. Again noted is sequelae of recent acute sigmoid diverticulitis. There is a new extraluminal collection of gas and trace fluid in the left lower quadrant of the abdomen abutting the undersurface of the ventral abdominal wall measuring 5.4 x 3.0 cm. There is  associated fistulous communication with the nearby inflamed segment of sigmoid colon. 2. Decrease in size of right posterior pelvic fluid collection status post percutaneous drainage catheter placement. 3. Increase in volume of left pleural effusion with overlying compressive type atelectasis of the left lower lobe. 4. Interval decrease in pneumoperitoneum. 5. Chronic right middle lobe collapse. 6. Gallstones. Mild gallbladder wall thickening, which is nonspecific and may reflect incomplete distension. If there is a clinical concern for cholecystitis right upper quadrant sonogram is advised.  Risks and benefits discussed with the patient including bleeding, infection, damage to adjacent structures, bowel perforation/fistula connection, and sepsis.  All of the patient's questions were answered, patient is agreeable to proceed. Consent signed and in radiology APP office.   Thank you for this interesting consult.  I greatly enjoyed meeting Sharma Lawrance and look forward to participating in their care.  A copy of this report was sent to the requesting provider on this date.  Electronically Signed: Shon Hough, NP 02/24/2022, 4:31 PM   I spent a total of 20 minutes in face to face in clinical consultation, greater than 50% of which was counseling/coordinating care for intra-abdominal fluid collection drain placement.

## 2022-02-25 ENCOUNTER — Observation Stay: Payer: Medicare Other

## 2022-02-25 ENCOUNTER — Observation Stay: Payer: Medicare Other | Admitting: Radiology

## 2022-02-25 DIAGNOSIS — K5780 Diverticulitis of intestine, part unspecified, with perforation and abscess without bleeding: Secondary | ICD-10-CM | POA: Diagnosis not present

## 2022-02-25 DIAGNOSIS — R0602 Shortness of breath: Secondary | ICD-10-CM | POA: Diagnosis not present

## 2022-02-25 DIAGNOSIS — J90 Pleural effusion, not elsewhere classified: Secondary | ICD-10-CM | POA: Diagnosis not present

## 2022-02-25 HISTORY — PX: IR SINUS/FIST TUBE CHK-NON GI: IMG673

## 2022-02-25 MED ORDER — MIDAZOLAM HCL 2 MG/2ML IJ SOLN
INTRAMUSCULAR | Status: AC
  Filled 2022-02-25: qty 2

## 2022-02-25 MED ORDER — IBUPROFEN 400 MG PO TABS
600.0000 mg | ORAL_TABLET | ORAL | Status: AC
  Administered 2022-02-25: 600 mg via ORAL
  Filled 2022-02-25: qty 2

## 2022-02-25 MED ORDER — FENTANYL CITRATE (PF) 100 MCG/2ML IJ SOLN
INTRAMUSCULAR | Status: AC | PRN
  Administered 2022-02-25 (×2): 25 ug via INTRAVENOUS

## 2022-02-25 MED ORDER — LEVOTHYROXINE SODIUM 88 MCG PO TABS: 88.0000 ug | ORAL_TABLET | Freq: Every day | ORAL | 0 refills | Status: AC

## 2022-02-25 MED ORDER — AMOXICILLIN-POT CLAVULANATE 875-125 MG PO TABS: 1.0000 | ORAL_TABLET | Freq: Two times a day (BID) | ORAL | 0 refills | Status: AC

## 2022-02-25 MED ORDER — IOHEXOL 350 MG/ML SOLN: 10.0000 mL | Freq: Once | INTRAVENOUS | Status: DC | PRN

## 2022-02-25 MED ORDER — FENTANYL CITRATE (PF) 100 MCG/2ML IJ SOLN
INTRAMUSCULAR | Status: AC
  Filled 2022-02-25: qty 2

## 2022-02-25 NOTE — Progress Notes (Signed)
Patient presented to [facility] IR today for evaluation of current RLQ (Transgluteal approach) drain and placement of a new intra-abdominal abscess drain. ? ?A new LLQ drain was placed by Dr. Anselm Pancoast. The RLQ (transgluteal approach) drain demonstrated scant serous output. Contrast injection under fluoroscopy was negative for a fistula and the drain was removed. The drain and sutures were removed intact, mild erythema and drainage observed at the skin insertion site. The patient tolerated drain removal well and the site was covered with gauze/tape.  ? ?Per communication with the hospitalist, the patient has tentative plans for discharge today. The patient will follow up with [facility]iology in 10-14 days at [facility]. A scheduler from our office will call the patient with a date/time of her appointment.  ? ?The drain is to gravity and daily flushes are not required. Patient needs to document the daily drain output and keep the site clean and dry.  ? Soyla Dryer, AGACNP-BC ?[phone] ?02/25/2022, 1:50 PM ? ? ?

## 2022-02-25 NOTE — Procedures (Signed)
Interventional Radiology Procedure: ? ? ?Indications: Diverticular abscess ? ?Procedure: 1) CT guided drain placement 2) Injection of right transgluteal drain and removal of drain ? ?Findings: Placed 10 Fr drain in left anterior abdominal gas collection.  8 ml of purulent fluid removed along with gas.  ? ?Injected existing transgluteal drain and no evidence for a fistula in the pelvis.  Transgluteal drain was removed.  ? ?Complications: No immediate complications noted. ?    ?EBL: Minimal ? ?Plan: Patient will need follow up CT in 1-2 weeks and drain injection prior to drain removal.  ? ? ?Steadman Prosperi R. Anselm Pancoast, MD  ?Pager: [phone] ? ? ? ?  ?

## 2022-02-25 NOTE — Progress Notes (Signed)
[facility] ?SURGICAL PROGRESS NOTE (cpt [phone]) ? ?Hospital Day(s): 0.  ? ?Interval History: Patient seen and examined, no acute events or new complaints overnight. Patient reports she is doing well this morning; she is concerned about when she can go home. No abdominal pain, fever, chills, nausea, emesis. No new labs this morning. She is NPO this morning. Plan for new percutaneous drain placement for new LLQ air fluid collection and drain study of gluteal drain.  ? ?Review of Systems:  ?Constitutional: denies fever, chills  ?HEENT: denies cough or congestion  ?Respiratory: denies any shortness of breath  ?Cardiovascular: denies chest pain or palpitations  ?Gastrointestinal: denies abdominal pain, N/V ?Genitourinary: denies burning with urination or urinary frequency ?Musculoskeletal: denies pain, decreased motor or sensation ? ?Vital signs in last 24 hours: [min-max] current  ?Temp:  [97.5 ?F (36.4 ?C)-98.2 ?F (36.8 ?C)] 98.1 ?F (36.7 ?C) (05/12 0456) ?Pulse Rate:  [83-104] 87 (05/12 0456) ?Resp:  [18] 18 (05/12 0456) ?BP: (124-159)/(61-78) 159/72 (05/12 0456) ?SpO2:  [95 %-98 %] 96 % (05/12 0456) ?Weight:  [79.3 kg] 79.3 kg (05/11 0734)     Height: '5\' 7"'$  (170.2 cm) Weight: 79.3 kg BMI (Calculated): 27.37  ? ?Intake/Output last 2 shifts:  ?05/11 0701 - 05/12 0700 ?In: 720 [P.O.:720] ?Out: 1125 [Urine:1125]  ? ?Physical Exam:  ?Constitutional: alert, cooperative and no distress  ?HENT: normocephalic without obvious abnormality  ?Eyes: PERRL, EOM's grossly intact and symmetric  ?Respiratory: breathing non-labored at rest; on [place] ?Cardiovascular: regular rate and sinus rhythm  ?Gastrointestinal: soft, non-tender, and non-distended. No rebound/guarding. She Is certainly not peritonitic. Gluteal drain present; minimal output; serous. ?Musculoskeletal: no edema or wounds, motor and sensation grossly intact, NT  ? ? ?Labs:  ? ?  Latest Ref Rng & Units 02/24/2022  ?  4:10 AM 02/23/2022  ?  9:28 AM 02/18/2022  ?   4:54 AM  ?CBC  ?WBC 4.0 - 10.5 K/uL 11.8   14.0   6.5    ?Hemoglobin 12.0 - 15.0 g/dL 10.5   11.0   10.1    ?Hematocrit 36.0 - 46.0 % 33.8   35.3   33.0    ?Platelets 150 - 400 K/uL 419   387   270    ? ? ?  Latest Ref Rng & Units 02/24/2022  ?  4:10 AM 02/23/2022  ?  9:28 AM 02/20/2022  ?  5:07 AM  ?CMP  ?Glucose 70 - 99 mg/dL 169   119   109    ?BUN 8 - 23 mg/dL 10   5   <5    ?Creatinine 0.44 - 1.00 mg/dL 0.62   0.57   0.56    ?Sodium 135 - 145 mmol/L 137   134   139    ?Potassium 3.5 - 5.1 mmol/L 4.5   4.5   3.6    ?Chloride 98 - 111 mmol/L 98   96   104    ?CO2 22 - 32 mmol/L 33   32   30    ?Calcium 8.9 - 10.3 mg/dL 8.8   8.6   8.3    ? ? ? ?Imaging studies: No new pertinent imaging studies ? ? ?Assessment/Plan: (ICD-10's: K57.92) ?88 y.o. female presenting with wheezing and SOB with recent history of perforated diverticulitis with abscess s/p percutaneous drain placement on 05/04 ? ? - Greatly appreciate IR assistance with placement of new drain in LLQ air/fluid collection +/- fistula and study of current gluteal drain. Okay to  remove gluteal drain pending study results ? - NPO for IR procedure; should be okay to resume diet following completion of this  ? - Complete PO Abx course; may need to adjust length pending results of procedure today  ? - Monitor abdominal examination; on-going bowel function ?- Pain control prn; antiemetics prn ?- Further management per primary service; we will follow   ? ?All of the above findings and recommendations were discussed with the patient, patient's family (son - at bedside), and the medical team, and all of patient's and family's questions were answered to their expressed satisfaction. ? ?-- ?Edison Simon, PA-C ?[facility] ?02/25/2022, 7:20 AM ?M-F: 7am - 4pm ? ?

## 2022-02-25 NOTE — Progress Notes (Signed)
Patient clinically stable post 10 FR drain placed abdomen with 8 ml purulent material removed, tolerated well with Fentanyl 50 mcg IV for procedure along with Local per Dr Anselm Pancoast, tolerated well. Awake/alert and oriented post procedure. Taken to Ir with gluteal drain checked and then removed per orders per IR team.tolerated well. Report given to [facility] on 1C post procedure at bedside. ?

## 2022-02-25 NOTE — TOC Transition Note (Signed)
Transition of Care (TOC) - CM/SW Discharge Note ? ? ?Patient Details  ?Name: Laura Frey ?MRN: [id number] ?Date of Birth: [birth year] ? ?Transition of Care (TOC) CM/SW Contact:  ?Shelbie Hutching, RN ?Phone Number: ?02/25/2022, 1:47 PM ? ? ?Clinical Narrative:    ?Patient will discharge today after IR procedure.  [name] with [facility] is aware of discharge today, orders are in.  Oxygen is being delivered to the room soon.   ? ? ?Final next level of care: [place] ?Barriers to Discharge: Barriers Resolved ? ? ?Patient Goals and CMS Choice ?  ?CMS Medicare.gov Compare Post Acute Care list provided to:: Patient ?Choice offered to / list presented to : Patient, Adult Children ? ?Discharge Placement ?  ?           ?  ?  ?  ?  ? ?Discharge Plan and Services ?  ?  ?           ?DME Arranged: Oxygen ?DME Agency: [employer] ?Date DME Agency Contacted: 02/25/22 ?Time DME Agency Contacted: [phone] ?Representative spoke with at DME Agency: Andree Coss ?HH Arranged: PT, OT, Nurse's Aide ?[place] Agency: [place] ([place] ?Date HH Agency Contacted: 02/25/22 ?Time [place]: 3428 ?Representative spoke with at [place]: Floydene Flock ? ?Social Determinants of Health (SDOH) Interventions ?  ? ? ?Readmission Risk Interventions ?   ? View : No data to display.  ?  ?  ?  ? ? ? ? ? ?

## 2022-02-25 NOTE — Progress Notes (Signed)
[facility]) Hospital Liaison note: ? ?Notified via Epic workque from Dr. Max Sane of request for [place] services. Will continue to follow for disposition. ? ?Please call with any outpatient palliative questions or concerns. ? ?Thank you for the opportunity to participate in this patient's care. ? ?Thank you, ?Lorelee Market, LPN ?[facility] Hospital Liaison ?[phone] ?

## 2022-02-27 NOTE — Progress Notes (Signed)
?  Progress Note ? ? ?Patient: Laura Frey OFB:[id number] DOB: [birth year] DOA: 02/23/2022     0 ?DOS: the patient was seen and examined on 02/27/2022 ?  ?Brief hospital course: ?Admitted for hypoxia and left-sided pleural effusion.  ?5/11 getting surgical opinion for percutaneous drain ? ?Assessment and Plan: ? ?Left sided small pleural effusion ?Patient is minimally symptomatic from this but may require oxygen and will likely qualify for 2 L. ?Her oxygen saturation dropped in the 80s with ambulation so we will set her up with 2 L oxygen ?She does not have enough fluid to drain ? ?Acute hypoxic respiratory failure ?Requiring 2 L oxygen.  Initially she required 4 L but will set up for 2 L oxygen based on the oxygen qualification criteria.  This is likely due to new small pleural effusion ? ?Diverticular abscess ?We will get IR to place a new drain and remove the previous 1 tomorrow ? ? ?  ? ?Subjective: Patient feeling much better and willing to have her drain under IR ? ?Physical Exam: ?Vitals:  ? 02/25/22 1305 02/25/22 1314 02/25/22 1325 02/25/22 1408  ?BP: (!) 143/60 (!) 142/78 (!) 144/78 139/79  ?Pulse: 78 78 78 78  ?Resp: '16 16 20 17  '$ ?Temp:    98 ?F (36.7 ?C)  ?TempSrc:    Oral  ?SpO2: 95% 96% 96% 95%  ?Weight:      ?Height:      ? ?88 year old female lying in the bed comfortably without any acute distress ?Lungs clear to auscultation bilaterally, no wheezing rales rhonchi crepitation ?Cardiovascular S1-S2 normal ?Abdomen soft, benign ?Neuro alert and oriented, nonfocal ?Psych normal mood and affect ?GU surgical drain present in the right buttock.  Serous output in the bulb ? ?Data Reviewed: ? ?There are no new results to review at this time. ? ?Family Communication: Son updated at bedside ? ? ? Planned Discharge Destination: Home ? ? ? ?Time spent: 35 minutes ? ?Author: ?Max Sane, MD ?02/27/2022 2:36 PM ? ?For on call review [email or web address].  ?

## 2022-02-27 NOTE — Discharge Summary (Signed)
?Physician Discharge Summary ?  ?Patient: Laura Frey MRN: [id number] DOB: 1932/10/12  ?Admit date:     02/23/2022  ?Discharge date: 02/25/2022  ?Discharge Physician: Max Sane  ? ?PCP: Marinda Elk, MD  ? ?Recommendations at discharge:  ? ?Follow-up with outpatient providers as requested ? ?Discharge Diagnoses: ?Principal Problem: ?  Pleural effusion on left ? ? ?Hospital Course: ?Assessment and Plan: ? ?Left sided small pleural effusion ?Patient is minimally symptomatic from this but may require oxygen and will likely qualify for 2 L. ?Her oxygen saturation dropped in the 80s with ambulation so we will set her up with 2 L oxygen ?She does not have enough fluid to drain ?  ?Acute hypoxic respiratory failure ?Requiring 2 L oxygen.  Initially she required 4 L but will set up for 2 L oxygen based on the oxygen qualification criteria.  This is likely multifactorial new small pleural effusion, bronchitis and/or atelectasis ? ?Diverticular abscess ?CT-guided drain placement on 5/12 and removal of previous transgluteal drain ?8 mm of purulent fluid removed along with gas.  Injection in the transgluteal drain showed no evidence for fistula in the pelvis. ? ? ?  ? ? ?Consultants: Surgery and IR ?Procedures performed: IR guided drain placement on 5/12 ?Disposition: Home health ?Diet recommendation:  ?Discharge Diet Orders (From admission, onward)  ? ?  Start     Ordered  ? 02/25/22 0000  Diet - low sodium heart healthy       ? 02/25/22 1349  ? ?  ?  ? ?  ? ?Carb modified diet ?DISCHARGE MEDICATION: ?Allergies as of 02/25/2022   ? ?   Reactions  ? Ciprofloxacin Swelling, Rash  ? Hydrocodone   ? hallucinations  ? Oxycodone   ? hallucinations  ? Zoloft [sertraline Hcl]   ? Burns stomach  ? Amoxicillin Diarrhea  ? Sertraline Other (See Comments)  ? "Burns stomach"  ? Sulfa Antibiotics   ? Blisters in mouth  ? Trazodone   ? Other reaction(s): Unknown  ? Tylenol [acetaminophen]   ? headaches  ? Ultram [tramadol]   ?  headaches  ? Cefuroxime Axetil Rash  ? ?  ? ?  ?Medication List  ?  ? ?TAKE these medications   ? ?amLODipine 10 MG tablet ?Commonly known as: NORVASC ?Take 10 mg by mouth daily. ?  ?amoxicillin-clavulanate 875-125 MG tablet ?Commonly known as: AUGMENTIN ?Take 1 tablet by mouth 2 (two) times daily for 5 days. ?  ?Aspirin-Caffeine 1000-65 MG Pack ?Take 1 packet by mouth 2 (two) times daily. ?  ?Cholecalciferol 10 MCG (400 UNIT) Caps ?Take by mouth. Take by mouth daily. ?  ?DULoxetine 60 MG capsule ?Commonly known as: CYMBALTA ?  ?fexofenadine 180 MG tablet ?Commonly known as: ALLEGRA ?Take 180 mg by mouth daily. ?  ?levothyroxine 88 MCG tablet ?Commonly known as: SYNTHROID ?Take 1 tablet (88 mcg total) by mouth daily before breakfast. ?What changed:  ?medication strength ?how much to take ?  ?magnesium hydroxide 400 MG/5ML suspension ?Commonly known as: MILK OF MAGNESIA ?Take 30 mLs by mouth at bedtime as needed for mild constipation. ?  ?magnesium oxide 400 MG tablet ?Commonly known as: MAG-OX ?Take 400 mg by mouth daily. ?  ?metoprolol 200 MG 24 hr tablet ?Commonly known as: TOPROL-XL ?Take 200 mg by mouth daily. ?  ?multivitamin with minerals Tabs tablet ?Take 1 tablet by mouth daily. ?  ?omeprazole 20 MG capsule ?Commonly known as: PRILOSEC ?Take 20 mg by mouth daily. ?  ?VITAMIN  E PO ?Take 500 Units by mouth daily. ?  ? ?  ? ? Follow-up Information   ? ? [place] Follow up.   ?Why: Follow up with [facility] Radiology in 10-14 days. A scheduler from our office will call you with a date/time of your appointment. Please call our office with any questions/concerns prior to your visit. ?Contact information: ?[address] ?[place] [address] ?[phone] ? ? ?  ?  ? ? Marinda Elk, MD. Schedule an appointment as soon as possible for a visit in 3 day(s).   ?Specialty: Physician Assistant ?Why: [facility] Discharge F/UP ?Contact information: ?[address] ?[facility] ?[place] [address] ?[phone] ? ? ?  ?  ? ?  ?  ? ?  ? ?Discharge Exam: ?Filed Weights  ? 02/23/22 6301 02/24/22 0734  ?Weight: 85 kg 79.63 kg  ? ?88 year old female lying in the bed comfortably without any acute distress ?Lungs clear to auscultation bilaterally, no wheezing rales rhonchi crepitation ?Cardiovascular S1-S2 normal ?Abdomen soft, benign, 10 French drain in the left anterior abdominal area ?Neuro alert and oriented, nonfocal ?Psych normal mood and affect ? ? ?Condition at discharge: good ? ?The results of significant diagnostics from this hospitalization (including imaging, microbiology, ancillary and laboratory) are listed below for reference.  ? ?Imaging Studies: ?DG Chest 2 View ? ?Result Date: 02/23/2022 ?CLINICAL DATA:  Dyspnea, RIGHT hip pain for 1 week after slipping on the commode EXAM: CHEST - 2 VIEW COMPARISON:  02/13/2022 FINDINGS: Normal heart size, mediastinal contours, and pulmonary vascularity. Atherosclerotic calcification aorta. Mild RIGHT basilar atelectasis. Small LEFT pleural effusion and accompanying LEFT basilar atelectasis. Chronic central peribronchial thickening. Upper lungs clear. No pneumothorax. Bones demineralized with note of prior cervical spine fusion. IMPRESSION: New LEFT pleural effusion and basilar atelectasis. Chronic bronchitic changes with subsegmental atelectasis at RIGHT base. Aortic Atherosclerosis (ICD10-I70.0). Electronically Signed   By: Lavonia Dana M.D.   On: 02/23/2022 10:20  ? ?CT Angio Chest Pulmonary Embolism (PE) W or WO Contrast ? ?Result Date: 02/14/2022 ?CLINICAL DATA:  Pulmonary embolism suspected, high probability EXAM: CT ANGIOGRAPHY CHEST WITH CONTRAST TECHNIQUE: Multidetector CT imaging of the chest was performed using the standard protocol during bolus administration of intravenous contrast. Multiplanar CT image reconstructions and MIPs were obtained to evaluate the vascular anatomy. RADIATION DOSE REDUCTION: This exam was  performed according to the departmental dose-optimization program which includes automated exposure control, adjustment of the mA and/or kV according to patient size and/or use of iterative reconstruction technique. CONTRAST:  50m OMNIPAQUE IOHEXOL 350 MG/ML SOLN COMPARISON:  Abdominal CT from yesterday.  Chest CT 02/05/2014 FINDINGS: Cardiovascular: Satisfactory opacification of the pulmonary arteries to the segmental level. No evidence of pulmonary embolism. Normal heart size. No pericardial effusion. Probable left ventricular and pulmonary outflow wall thickening. Aortic and coronary atheromatous calcification. Mediastinum/Nodes: Negative for adenopathy. Small sliding hiatal hernia with lower esophageal thickening which was also seen previously. Lungs/Pleura: Chronic right middle lobe collapse with blunted vessel enhancement. Mild atelectatic densities bilaterally. There is no edema, consolidation, effusion, or pneumothorax. Upper Abdomen: Reference preceding abdominal CT; known pneumoperitoneum. Musculoskeletal: Bulky spondylosis with multilevel spurring. Review of the MIP images confirms the above findings. IMPRESSION: Negative for pulmonary embolism. Electronically Signed   By: JJorje GuildM.D.   On: 02/14/2022 06:57  ? ?CT ABDOMEN PELVIS W CONTRAST ? ?Result Date: 02/16/2022 ?CLINICAL DATA:  Follow-up perforated diverticulitis. Worsening abdominal pain. EXAM: CT ABDOMEN AND PELVIS WITH CONTRAST TECHNIQUE:  Multidetector CT imaging of the abdomen and pelvis was performed using the standard protocol following bolus administration of intravenous contrast. RADIATION DOSE REDUCTION: This exam was performed according to the departmental dose-optimization program which includes automated exposure control, adjustment of the mA and/or kV according to patient size and/or use of iterative reconstruction technique. CONTRAST:  176m OMNIPAQUE IOHEXOL 300 MG/ML  SOLN COMPARISON:  02/13/2022 FINDINGS: Lower Chest: New  small left pleural effusion and left lower lobe atelectasis. Hepatobiliary: No hepatic masses identified. Multiple tiny less than 5 mm gallstones are again seen. Gallbladder is distended, but there are no other findi

## 2022-02-28 LAB — CULTURE, BLOOD (ROUTINE X 2)
Culture: NO GROWTH
Culture: NO GROWTH

## 2022-02-28 LAB — AEROBIC/ANAEROBIC CULTURE W GRAM STAIN (SURGICAL/DEEP WOUND)

## 2022-03-01 ENCOUNTER — Other Ambulatory Visit: Payer: Self-pay | Admitting: Physician Assistant

## 2022-03-01 DIAGNOSIS — K5780 Diverticulitis of intestine, part unspecified, with perforation and abscess without bleeding: Secondary | ICD-10-CM

## 2022-03-03 ENCOUNTER — Telehealth: Payer: Self-pay | Admitting: Student

## 2022-03-03 NOTE — Telephone Encounter (Signed)
Spoke with patient's daughter Laura Frey, regarding the Palliative referral/services and all questions were answered and she was in agreement with beginning services with [place].  I have scheduled a MyChart Palliative Consult for 03/15/22 @ 9 AM

## 2022-03-08 ENCOUNTER — Ambulatory Visit: Payer: Medicare Other | Admitting: Surgery

## 2022-03-10 ENCOUNTER — Ambulatory Visit
Admission: RE | Admit: 2022-03-10 | Discharge: 2022-03-10 | Disposition: A | Discharge status: Home / Self Care | Payer: Medicare Other | Source: Ambulatory Visit | Attending: Student | Admitting: Student

## 2022-03-10 ENCOUNTER — Ambulatory Visit
Admission: RE | Admit: 2022-03-10 | Discharge: 2022-03-10 | Disposition: A | Discharge status: Home / Self Care | Payer: Medicare Other | Source: Ambulatory Visit | Attending: Physician Assistant | Admitting: Physician Assistant

## 2022-03-10 DIAGNOSIS — K5780 Diverticulitis of intestine, part unspecified, with perforation and abscess without bleeding: Secondary | ICD-10-CM

## 2022-03-10 HISTORY — PX: IR RADIOLOGIST EVAL & MGMT: IMG5224

## 2022-03-10 MED ORDER — IOPAMIDOL (ISOVUE-300) INJECTION 61%
100.0000 mL | Freq: Once | INTRAVENOUS | Status: AC | PRN
  Administered 2022-03-10: 100 mL via INTRAVENOUS

## 2022-03-11 ENCOUNTER — Other Ambulatory Visit: Payer: Self-pay | Admitting: Physician Assistant

## 2022-03-11 DIAGNOSIS — L02211 Cutaneous abscess of abdominal wall: Secondary | ICD-10-CM

## 2022-03-15 ENCOUNTER — Encounter: Payer: Self-pay | Admitting: Surgery

## 2022-03-15 ENCOUNTER — Telehealth: Payer: Self-pay | Admitting: Student

## 2022-03-15 ENCOUNTER — Ambulatory Visit: Payer: Medicare Other | Admitting: Surgery

## 2022-03-15 ENCOUNTER — Other Ambulatory Visit: Payer: Self-pay

## 2022-03-15 VITALS — BP 130/69 | HR 88 | Temp 98.7°F | Ht 62.0 in | Wt 152.0 lb

## 2022-03-15 DIAGNOSIS — K5720 Diverticulitis of large intestine with perforation and abscess without bleeding: Secondary | ICD-10-CM | POA: Diagnosis not present

## 2022-03-15 DIAGNOSIS — J418 Mixed simple and mucopurulent chronic bronchitis: Secondary | ICD-10-CM | POA: Insufficient documentation

## 2022-03-15 DIAGNOSIS — M4600 Spinal enthesopathy, site unspecified: Secondary | ICD-10-CM | POA: Insufficient documentation

## 2022-03-15 DIAGNOSIS — K5780 Diverticulitis of intestine, part unspecified, with perforation and abscess without bleeding: Secondary | ICD-10-CM

## 2022-03-15 NOTE — Progress Notes (Signed)
Patient ID: Laura Frey, female   DOB: 11-Jul-1932, 88 y.o.   MRN: [id number]  Chief Complaint: Continued diverticular abscess drain present.  History of Present Illness Laura Frey is a 88 y.o. female with a follow-up by interventional radiology confirming that her current drain at least on the last set of images communicated with the colon.  This was her second drain placed for complicated diverticulitis.  The first 1 was transgluteal on the right, which did extremely well and has been removed.  She still has some sciatica pain which began all this primarily limited to the right, she had this pain prior to drain placement.  She currently is tolerating the drain on the left, and reportedly over the last 72 hours has had very little to no output.  She denies fevers and chills, she denies nausea and vomiting.  She reports a good appetite.  She still has some lingering left lower quadrant drain pain.  She has a planned repeat drain study by interventional radiology scheduled for June 8.  Past Medical History Past Medical History:  Diagnosis Date   Anemia    hx Thalassemia minor anemia   Asthma    Cancer (HCC)    melanoma on back   Chronic back pain    ESI/uses BC powders   Depression    GERD (gastroesophageal reflux disease)    Headache(784.0)    Hypertension    Hypothyroidism    Peripheral vascular disease (HCC)    legs   Sepsis ([place]) 01/27/2014   Shortness of breath    Tendinitis of both rotator cuffs    UTI (urinary tract infection)       Past Surgical History:  Procedure Laterality Date   ABDOMINAL HYSTERECTOMY     vagina   ANTERIOR CERVICAL DECOMP/DISCECTOMY FUSION N/A 05/01/2013   Procedure: ANTERIOR CERVICAL DECOMPRESSION/DISCECTOMY FUSION 3 LEVELS;  Surgeon: Elaina Hoops, MD;  Location: [facility];  Service: Neurosurgery;  Laterality: N/A;  ANTERIOR CERVICAL DECOMPRESSION/DISCECTOMY FUSION 3 LEVELS   APPENDECTOMY     BLADDER REPAIR     after hysterectomy same day    EYE SURGERY Bilateral    cataracts   IR RADIOLOGIST EVAL & MGMT  03/10/2022   IR SINUS/FIST TUBE CHK-NON GI  02/25/2022   TONSILLECTOMY     TUBAL LIGATION      Allergies  Allergen Reactions   Ciprofloxacin Swelling and Rash   Hydrocodone     hallucinations   Oxycodone     hallucinations   Zoloft [Sertraline Hcl]     Burns stomach   Amoxicillin Diarrhea   Sertraline Other (See Comments)    "Burns stomach"   Sulfa Antibiotics     Blisters in mouth   Trazodone     Other reaction(s): Unknown   Tylenol [Acetaminophen]     headaches   Ultram [Tramadol]     headaches   Cefuroxime Axetil Rash    Current Outpatient Medications  Medication Sig Dispense Refill   amLODipine (NORVASC) 10 MG tablet Take 10 mg by mouth daily.     Aspirin-Caffeine 1000-65 MG PACK Take 1 packet by mouth 2 (two) times daily.     Cholecalciferol 10 MCG (400 UNIT) CAPS Take by mouth. Take by mouth daily.     DULoxetine (CYMBALTA) 60 MG capsule   0   fexofenadine (ALLEGRA) 180 MG tablet Take 180 mg by mouth daily.     levothyroxine (SYNTHROID) 88 MCG tablet Take 1 tablet (88 mcg total) by mouth daily  before breakfast. 30 tablet 0   magnesium hydroxide (MILK OF MAGNESIA) 400 MG/5ML suspension Take 30 mLs by mouth at bedtime as needed for mild constipation. 355 mL 0   magnesium oxide (MAG-OX) 400 MG tablet Take 400 mg by mouth daily.     metoprolol (TOPROL-XL) 200 MG 24 hr tablet Take 200 mg by mouth daily.     Multiple Vitamin (MULTIVITAMIN WITH MINERALS) TABS Take 1 tablet by mouth daily.     omeprazole (PRILOSEC) 20 MG capsule Take 20 mg by mouth daily.     VITAMIN E PO Take 500 Units by mouth daily.     No current facility-administered medications for this visit.    Family History Family History  Problem Relation Age of Onset   Thalassemia Son    Thalassemia Paternal Aunt       Social History Social History   Tobacco Use   Smoking status: Former    Packs/day: 1.00    Years: 30.00    Pack  years: 30.00    Types: Cigarettes    Quit date: 04/30/1995    Years since quitting: 26.8   Smokeless tobacco: Never  Substance Use Topics   Alcohol use: No   Drug use: No        Review of Systems  All other systems reviewed and are negative.   Physical Exam Blood pressure 130/69, pulse 88, temperature 98.7 F (37.1 C), temperature source Oral, height '5\' 2"'$  (1.575 m), weight 152 lb (68.9 kg), SpO2 91 %. Last Weight  Most recent update: 03/15/2022  1:52 PM    Weight  68.9 kg (152 lb)             CONSTITUTIONAL: Well developed, and nourished, appropriately responsive and aware without distress.  EYES: Sclera non-icteric.   EARS, NOSE, MOUTH AND THROAT:  The oropharynx is clear. Oral mucosa is pink and moist.   Hearing is intact to voice.  NECK: Trachea is midline, and there is no jugular venous distension.  LYMPH NODES:  Lymph nodes in the neck are not enlarged. RESPIRATORY:  Lungs are clear, and breath sounds are equal bilaterally. Normal respiratory effort without pathologic use of accessory muscles. CARDIOVASCULAR: Heart is regular in rate and rhythm. GI: The abdomen is soft, nontender, and nondistended. There were no palpable masses.  There is a passive left lower quadrant drain with bile type bag without any significant drainage present.    SKIN: Skin turgor is normal. No pathologic skin lesions appreciated.  NEUROLOGIC:    Cranial nerves are grossly without defect. PSYCH:  Alert and oriented to person, place and time. Affect is appropriate for situation.  Data Reviewed I have personally reviewed what is currently available of the patient's imaging, recent labs and medical records.   Labs:     Latest Ref Rng & Units 02/24/2022    4:10 AM 02/23/2022    9:28 AM 02/18/2022    4:54 AM  CBC  WBC 4.0 - 10.5 K/uL 11.8   14.0   6.5    Hemoglobin 12.0 - 15.0 g/dL 10.5   11.0   10.1    Hematocrit 36.0 - 46.0 % 33.8   35.3   33.0    Platelets 150 - 400 K/uL 419   387   270         Latest Ref Rng & Units 02/24/2022    4:10 AM 02/23/2022    9:28 AM 02/20/2022    5:07 AM  CMP  Glucose 70 -  99 mg/dL 169   119   109    BUN 8 - 23 mg/dL 10   5   <5    Creatinine 0.44 - 1.00 mg/dL 0.62   0.57   0.56    Sodium 135 - 145 mmol/L 137   134   139    Potassium 3.5 - 5.1 mmol/L 4.5   4.5   3.6    Chloride 98 - 111 mmol/L 98   96   104    CO2 22 - 32 mmol/L 33   32   30    Calcium 8.9 - 10.3 mg/dL 8.8   8.6   8.3        Imaging: Radiology review:  CLINICAL DATA:  Diverticulitis status post percutaneous drainage, follow-up   EXAM: CT ABDOMEN AND PELVIS WITH CONTRAST   TECHNIQUE: Multidetector CT imaging of the abdomen and pelvis was performed using the standard protocol following bolus administration of intravenous contrast.   RADIATION DOSE REDUCTION: This exam was performed according to the departmental dose-optimization program which includes automated exposure control, adjustment of the mA and/or kV according to patient size and/or use of iterative reconstruction technique.   CONTRAST:  154m ISOVUE-300 IOPAMIDOL (ISOVUE-300) INJECTION 61%   COMPARISON:  Multiple priors from may 2023   FINDINGS: Inferior chest: The lung bases are well-aerated.   Hepatobiliary: The liver is normal in size without focal abnormality. No intrahepatic or extrahepatic biliary ductal dilation. Cholelithiasis.   Spleen: Normal in size without focal abnormality.   Pancreas: No pancreatic ductal dilatation or surrounding inflammatory changes.   Adrenals/Urinary Tract: Adrenal glands are unremarkable. Kidneys are normal, without renal calculi, focal lesion, or hydronephrosis. Bladder is unremarkable.   Stomach/Bowel: The stomach and small bowel appear normal. Again seen is extensive diverticular disease primarily of the sigmoid colon and descending colon. A left lower quadrant percutaneous drainage catheters present. It terminates in a decompressed cavity. Again seen is  a soft tissue tract suspicious for fistulous connection with the adjacent sigmoid colon (see key image, series 2, image 61).   Reproductive: Status post hysterectomy. No adnexal masses.   Lymphatic: No enlarged lymph nodes in the abdomen or pelvis.   Vasculature: The abdominal aorta is normal in caliber. Atherosclerosis.   Other: No abdominopelvic ascites.   Musculoskeletal: No aggressive osseous lesions. The soft tissues are unremarkable.   IMPRESSION: Extensive diverticular disease status post interval placement of a left lower quadrant percutaneous drainage catheter with appropriate decompression of the abscess cavity. There remains a soft tissue tract which connects to the adjacent sigmoid colon, suspicious for fistulous connection. Attention on forthcoming drain injection study.     Electronically Signed   By: YAlbin FellingM.D.   On: 03/10/2022 14:27 Within last 24 hrs: No results found. INDICATION: Perforated diverticulum of intestine   EXAM: Injection of percutaneous drainage catheter using fluoroscopy   MEDICATIONS: The patient is currently admitted to the hospital and receiving intravenous antibiotics. The antibiotics were administered within an appropriate time frame prior to the initiation of the procedure.   ANESTHESIA/SEDATION: None   COMPLICATIONS: None immediate.   PROCEDURE: Informed written consent was obtained from the patient after a thorough discussion of the procedural risks, benefits and alternatives. All questions were addressed. Maximal Sterile Barrier Technique was utilized including caps, mask, sterile gowns, sterile gloves, sterile drape, hand hygiene and skin antiseptic. A timeout was performed prior to the initiation of the procedure.   The patient was placed supine on the exam table.  Injection of the existing left lower quadrant percutaneous drainage catheter was performed using fluoroscopy visualization. Images demonstrate  a decompressed abscess cavity. There is brisk filling of a fistulous channel to the adjacent sigmoid colon. Reflux of contrast material is seen into the distal descending colon.   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 Drain left to bag drainage. Patient will return in 2 weeks for repeat drain injection and check.     Electronically Signed   By: Albin Felling M.D.   On: 03/10/2022 14:49 Assessment    Well-controlled colonic fistula. Well-tolerated bowel function without hindrance secondary to fistula. Patient Active Problem List   Diagnosis Date Noted   Mixed simple and mucopurulent chronic bronchitis ([place]) 03/15/2022   Spinal enthesopathy ([place]) 03/15/2022   Pleural effusion on left 02/23/2022   Perforated diverticulum of intestine 02/14/2022   CHF (congestive heart failure) (HCC) 02/14/2022   Chronic diastolic CHF (congestive heart failure) ([place]) 02/14/2022   Abdominal pain 02/14/2022   Acute respiratory failure with hypoxia ([place]) 02/14/2022   Closed compression fracture of L3 lumbar vertebra, initial encounter ([place]) 02/16/2021   Chronic low back pain (1ry area of Pain) (Bilateral) (R>L) w/o sciatica 01/07/2021   Spondylosis without myelopathy or radiculopathy, lumbar region 08/09/2018   Osteopenia of lumbar spine 08/06/2018   Complaints of weakness of lower extremity 07/17/2018   Lower extremity weakness 04/04/2018   Lower extremity numbness 04/04/2018   Disorder of skeletal system 01/01/2018   Pharmacologic therapy 01/01/2018   Problems influencing health status 01/01/2018   Cervicalgia 01/01/2018   Chronic upper back pain 01/01/2018   Lower extremity weakness (Bilateral) 01/01/2018   Frequent falls 12/11/2017   Gait instability 12/11/2017   COPD (chronic obstructive pulmonary disease) ([place]) 11/16/2017   Thalassemia 11/16/2017   Hypertension  11/16/2017   Hypothyroid 11/16/2017   Abnormal MRI, lumbar spine (2018) 11/16/2017   L2 compression fracture 11/16/2017   H/O cervical spine surgery (C4-C7 ACDF) 11/16/2017   Abnormal MRI, cervical spine (2014) 11/16/2017   Lumbar facet joint osteoarthritis (Bilateral) 11/16/2017   Lumbar facet hypertrophy (Bilateral) 11/16/2017   Lumbar facet syndrome (Bilateral) 11/16/2017   Osteoarthritis of lumbar spine 11/16/2017   Lumbar spondylosis 11/16/2017   Cervical spondylosis 11/16/2017   Cervical facet arthropathy (Bilateral) 11/16/2017   Chronic pain syndrome 11/16/2017   Osteoarthritis 11/16/2017   Lumbar Grade 1 Anterolisthesis of L3/L4 11/16/2017   Lumbosacral Grade 1 Retrolisthesis of L5/S1 11/16/2017   Lumbar lateral recess stenosis (Bilateral) 11/16/2017   Chronic lower extremity pain (2ry area of Pain) (Bilateral) (R>L) 11/16/2017   Chronic lower extremity radicular pain (Bilateral) (R>L) 11/16/2017   Chronic lumbar radiculitis (Right) 11/16/2017   DDD (degenerative disc disease), lumbar 11/16/2017   DDD (degenerative disc disease), cervical 11/16/2017   Hypoxia 02/27/2014   Anemia, unspecified 02/27/2014   Urinary tract infection 05/39/7673   Diastolic CHF, acute ([place]) 41/93/7902   Acute diastolic heart failure ([place]) 02/08/2014   Shortness of breath 02/05/2014   HTN (hypertension) 02/05/2014   Anemia 02/05/2014   SOB (shortness of breath) 02/05/2014   Nonspecific (abnormal) findings on radiological and other examination of gastrointestinal tract 01/31/2014   Severe sepsis ([place]) 01/27/2014   Hypotension, unspecified 01/27/2014   UTI (urinary tract infection) 01/27/2014   Unspecified hypothyroidism 01/27/2014   GERD (gastroesophageal reflux disease) 01/27/2014   Esophageal reflux 01/27/2014   Increased frequency of urination 02/12/2013   Other chronic cystitis without hematuria  02/12/2013   Recurrent UTI 02/12/2013   Urge incontinence 02/12/2013    Plan    Follow-up  per IR June 8, follow-up with [place] thereafter or within 4 to 6 weeks.  Face-to-face time spent with the patient and accompanying care providers(if present) was 20 minutes, with more than 50% of the time spent counseling, educating, and coordinating care of the patient.    These notes generated with voice recognition software. I apologize for typographical errors.  Ronny Bacon M.D., FACS 03/15/2022, 2:26 PM

## 2022-03-15 NOTE — Telephone Encounter (Signed)
Palliative NP spoke with patient's daughter Di Kindle. She states patient was up all night in pain and today is not a good day for appointment. She also has two other appointments this week. Rescheduled consult to 6/6 @ 315 in person.

## 2022-03-15 NOTE — Patient Instructions (Signed)
Diverticulitis  Diverticulitis is infection or inflammation of small pouches (diverticula) in the colon that form due to a condition called diverticulosis. Diverticula can trap stool (feces) and bacteria, causing infection and inflammation. Diverticulitis may cause severe stomach pain and diarrhea. It may lead to tissue damage in the colon that causes bleeding or blockage. The diverticula may also burst (rupture) and cause infected stool to enter other areas of the abdomen. What are the causes? This condition is caused by stool becoming trapped in the diverticula, which allows bacteria to grow in the diverticula. This leads to inflammation and infection. What increases the risk? You are more likely to develop this condition if you have diverticulosis. The risk increases if you: Are overweight or obese. Do not get enough exercise. Drink alcohol. Use tobacco products. Eat a diet that has a lot of red meat such as beef, pork, or lamb. Eat a diet that does not include enough fiber. High-fiber foods include fruits, vegetables, beans, nuts, and whole grains. Are over 40 years of age. What are the signs or symptoms? Symptoms of this condition may include: Pain and tenderness in the abdomen. The pain is normally located on the left side of the abdomen, but it may occur in other areas. Fever and chills. Nausea. Vomiting. Cramping. Bloating. Changes in bowel routines. Blood in your stool. How is this diagnosed? This condition is diagnosed based on: Your medical history. A physical exam. Tests to make sure there is nothing else causing your condition. These tests may include: Blood tests. Urine tests. CT scan of the abdomen. How is this treated? Most cases of this condition are mild and can be treated at home. Treatment may include: Taking over-the-counter pain medicines. Following a clear liquid diet. Taking antibiotic medicines by mouth. Resting. More severe cases may need to be treated  at a hospital. Treatment may include: Not eating or drinking. Taking prescription pain medicine. Receiving antibiotic medicines through an IV. Receiving fluids and nutrition through an IV. Surgery. When your condition is under control, your health care provider may recommend that you have a colonoscopy. This is an exam to look at the entire large intestine. During the exam, a lubricated, bendable tube is inserted into the anus and then passed into the rectum, colon, and other parts of the large intestine. A colonoscopy can show how severe your diverticula are and whether something else may be causing your symptoms. Follow these instructions at home: Medicines Take over-the-counter and prescription medicines only as told by your health care provider. These include fiber supplements, probiotics, and stool softeners. If you were prescribed an antibiotic medicine, take it as told by your health care provider. Do not stop taking the antibiotic even if you start to feel better. Ask your health care provider if the medicine prescribed to you requires you to avoid driving or using machinery. Eating and drinking  Follow a full liquid diet or another diet as directed by your health care provider. After your symptoms improve, your health care provider may tell you to change your diet. He or she may recommend that you eat a diet that contains at least 25 grams (25 g) of fiber daily. Fiber makes it easier to pass stool. Healthy sources of fiber include: Berries. One cup contains 4-8 grams of fiber. Beans or lentils. One-half cup contains 5-8 grams of fiber. Green vegetables. One cup contains 4 grams of fiber. Avoid eating red meat. General instructions Do not use any products that contain nicotine or tobacco, such as   cigarettes, e-cigarettes, and chewing tobacco. If you need help quitting, ask your health care provider. Exercise for at least 30 minutes, 3 times each week. You should exercise hard enough to  raise your heart rate and break a sweat. Keep all follow-up visits as told by your health care provider. This is important. You may need to have a colonoscopy. Contact a health care provider if: Your pain does not improve. Your bowel movements do not return to normal. Get help right away if: Your pain gets worse. Your symptoms do not get better with treatment. Your symptoms suddenly get worse. You have a fever. You vomit more than one time. You have stools that are bloody, black, or tarry. Summary Diverticulitis is infection or inflammation of small pouches (diverticula) in the colon that form due to a condition called diverticulosis. Diverticula can trap stool (feces) and bacteria, causing infection and inflammation. You are at higher risk for this condition if you have diverticulosis and you eat a diet that does not include enough fiber. Most cases of this condition are mild and can be treated at home. More severe cases may need to be treated at a hospital. When your condition is under control, your health care provider may recommend that you have an exam called a colonoscopy. This exam can show how severe your diverticula are and whether something else may be causing your symptoms. Keep all follow-up visits as told by your health care provider. This is important. This information is not intended to replace advice given to you by your health care provider. Make sure you discuss any questions you have with your health care provider. Document Revised: 07/15/2019 Document Reviewed: 07/15/2019 Elsevier Patient Education  2023 [employer]  

## 2022-03-22 ENCOUNTER — Other Ambulatory Visit: Payer: Self-pay | Admitting: Student

## 2022-03-22 DIAGNOSIS — R52 Pain, unspecified: Secondary | ICD-10-CM

## 2022-03-22 DIAGNOSIS — R531 Weakness: Secondary | ICD-10-CM

## 2022-03-22 DIAGNOSIS — R0602 Shortness of breath: Secondary | ICD-10-CM

## 2022-03-22 DIAGNOSIS — Z515 Encounter for palliative care: Secondary | ICD-10-CM

## 2022-03-22 DIAGNOSIS — K5780 Diverticulitis of intestine, part unspecified, with perforation and abscess without bleeding: Secondary | ICD-10-CM

## 2022-03-22 NOTE — Progress Notes (Unsigned)
[facility] [facility] Palliative Care Consult Note Telephone: [phone]  Fax: [phone]   Date of encounter: 03/22/22 3:22 PM PATIENT NAME: Laura Frey [address] [place] [id number]   [phone] (home)  DOB: 1931-12-22 MRN: [id number] PRIMARY CARE PROVIDER:    Marinda Elk, MD,  [place] [address] [phone]  REFERRING PROVIDER:   Marinda Elk, MD [place] [facility] [place],  [place] [address] [phone]  RESPONSIBLE PARTY:    Contact Information     Name Relation Home Work [place] Daughter [phone]     Jaella, Weinert   [phone]   EVAGELIA, KNACK [phone]          I met face to face with patient and family in the home. Palliative Care was asked to follow this patient by consultation request of  Marinda Elk, MD to address advance care planning and complex medical decision making. This is the initial visit.                                     ASSESSMENT AND PLAN / RECOMMENDATIONS:   Advance Care Planning/Goals of Care: Goals include to maximize quality of life and symptom management. Patient/health care surrogate gave his/her permission to discuss.Our advance care planning conversation included a discussion about:    The value and importance of advance care planning  Experiences with loved ones who have been seriously ill or have died  Exploration of personal, cultural or spiritual beliefs that might influence medical decisions  Exploration of goals of care in the event of a sudden injury or illness  Daughter Di Kindle is [place].  CODE STATUS: Full Code  Education provided on Palliative Medicine vs. Hospice services. Reviewed MOST form today; family will discuss further and we will complete on next visit. She is open to hospitalizations, expresses Full Code today, but also states she is unsure with staying a  Full Code. Will continue to provide supportive care, symptom management.   Symptom Management/Plan:  Shortness of breath- secondary to hypoxia, pleural effusion, HF, COPD. Continue oxygen at 2 lpm via nasal canula.   Generalized weakness- continue HHPT, OT as directed with [facility]. Use rollator walker for ambulation. Monitor for falls/safety.   Pain-patient endorses lower back pain, radiating down right leg. Patient and family report pain managed as of last night with tramadol and ibuprofen. Continue tramadol every 6 hours PRN, ibuprofen PRN. Continue PT as directed. Patient to have MRI scheduled given severe back pain.   Diverticulitis of large intestine with perforation-drain in place. Presently no drainage in bag. She is to follow up with IR as scheduled. HH SN as directed.   Follow up Palliative Care Visit: Palliative care will continue to follow for complex medical decision making, advance care planning, and clarification of goals. Return in 4-6 weeks or prn.   This visit was coded based on medical decision making (MDM).  PPS: 40%  HOSPICE ELIGIBILITY/DIAGNOSIS: TBD  Chief Complaint: Palliative Medicine initial consult.   HISTORY OF PRESENT ILLNESS:  Laura Frey is a 88 y.o. year old female  with chronic diastolic CHF, hypertension, hypothyroidism, COPD, chronic low back pain, OA, Hx of falls. Hospitalized 5/10-5/12/23 due to left pleural effusion, acute hypoxic respiratory failure, diverticular abscess with drain placement; 4/30-02/21/22 due to  perforated diverticulum of intestine.   Patient  states her back pain is managed as of last night; she reports severe back pain radiating down right leg. She endorses shortness of breath with exertion; she is wearing oxygen at 2 lpm. Currently has drain d/t perforated diverticula; will go on Thursday for follow up. Endorses good appetite; eating 2-3 good meals. Denies nausea or constipation. Had to be lied down in floor since coming  home; requires EMS to pick her up out of floor. Receiving OT, PT and SN services. Son reports patient to have an MRI of back. She is ambulating to and from bathroom with rollator walker. F/u appointment with PCP on July 10th. A 10-point ROS is negative, except for the pertinent positives and negatives detailed per the HPI.  History obtained from review of EMR, discussion with primary team, and interview with family, facility staff/caregiver and/or Ms. Mullany.  I reviewed available labs, medications, imaging, studies and related documents from the EMR.  Records reviewed and summarized above.    Physical Exam: Pulse 76, resp 20, sats 94% at 2 lpm Constitutional: NAD General: frail appearing EYES: anicteric sclera, lids intact, no discharge  ENMT: intact hearing, oral mucous membranes moist, dentition intact CV: S1S2, RRR, no LE edema Pulmonary: LCTA, no increased work of breathing, no cough, room air Abdomen: normo-active BS + 4 quadrants, soft and non tender, drain present GU: deferred MSK:  moves all extremities, ambulatory Skin: warm and dry, no rashes or wounds on visible skin Neuro:  +generalized weakness,  A & O x  3 Psych: non-anxious affect, pleasant Hem/lymph/immuno: no widespread bruising CURRENT PROBLEM LIST:  Patient Active Problem List   Diagnosis Date Noted   Mixed simple and mucopurulent chronic bronchitis (HCC) 03/15/2022   Spinal enthesopathy ([place]) 03/15/2022   Pleural effusion on left 02/23/2022   Perforated diverticulum of intestine 02/14/2022   CHF (congestive heart failure) (HCC) 02/14/2022   Chronic diastolic CHF (congestive heart failure) ([place]) 02/14/2022   Abdominal pain 02/14/2022   Acute respiratory failure with hypoxia (HCC) 02/14/2022   Closed compression fracture of L3 lumbar vertebra, initial encounter ([place]) 02/16/2021   Chronic low back pain (1ry area of Pain) (Bilateral) (R>L) w/o sciatica 01/07/2021   Spondylosis without myelopathy or radiculopathy,  lumbar region 08/09/2018   Osteopenia of lumbar spine 08/06/2018   Complaints of weakness of lower extremity 07/17/2018   Lower extremity weakness 04/04/2018   Lower extremity numbness 04/04/2018   Disorder of skeletal system 01/01/2018   Pharmacologic therapy 01/01/2018   Problems influencing health status 01/01/2018   Cervicalgia 01/01/2018   Chronic upper back pain 01/01/2018   Lower extremity weakness (Bilateral) 01/01/2018   Frequent falls 12/11/2017   Gait instability 12/11/2017   COPD (chronic obstructive pulmonary disease) ([place]) 11/16/2017   Thalassemia 11/16/2017   Hypertension 11/16/2017   Hypothyroid 11/16/2017   Abnormal MRI, lumbar spine (2018) 11/16/2017   L2 compression fracture 11/16/2017   H/O cervical spine surgery (C4-C7 ACDF) 11/16/2017   Abnormal MRI, cervical spine (2014) 11/16/2017   Lumbar facet joint osteoarthritis (Bilateral) 11/16/2017   Lumbar facet hypertrophy (Bilateral) 11/16/2017   Lumbar facet syndrome (Bilateral) 11/16/2017   Osteoarthritis of lumbar spine 11/16/2017   Lumbar spondylosis 11/16/2017   Cervical spondylosis 11/16/2017   Cervical facet arthropathy (Bilateral) 11/16/2017   Chronic pain syndrome 11/16/2017   Osteoarthritis 11/16/2017   Lumbar Grade 1 Anterolisthesis of L3/L4 11/16/2017   Lumbosacral Grade 1 Retrolisthesis of L5/S1 11/16/2017   Lumbar lateral recess stenosis (Bilateral) 11/16/2017   Chronic lower extremity pain (2ry area of  Pain) (Bilateral) (R>L) 11/16/2017   Chronic lower extremity radicular pain (Bilateral) (R>L) 11/16/2017   Chronic lumbar radiculitis (Right) 11/16/2017   DDD (degenerative disc disease), lumbar 11/16/2017   DDD (degenerative disc disease), cervical 11/16/2017   Hypoxia 02/27/2014   Anemia, unspecified 02/27/2014   Urinary tract infection 78/58/8502   Diastolic CHF, acute ([place]) 77/41/2878   Acute diastolic heart failure ([place]) 02/08/2014   Shortness of breath 02/05/2014   HTN (hypertension)  02/05/2014   Anemia 02/05/2014   SOB (shortness of breath) 02/05/2014   Nonspecific (abnormal) findings on radiological and other examination of gastrointestinal tract 01/31/2014   Severe sepsis ([place]) 01/27/2014   Hypotension, unspecified 01/27/2014   UTI (urinary tract infection) 01/27/2014   Unspecified hypothyroidism 01/27/2014   GERD (gastroesophageal reflux disease) 01/27/2014   Esophageal reflux 01/27/2014   Increased frequency of urination 02/12/2013   Other chronic cystitis without hematuria 02/12/2013   Recurrent UTI 02/12/2013   Urge incontinence 02/12/2013   PAST MEDICAL HISTORY:  Active Ambulatory Problems    Diagnosis Date Noted   Severe sepsis ([place]) 01/27/2014   Hypotension, unspecified 01/27/2014   UTI (urinary tract infection) 01/27/2014   Unspecified hypothyroidism 01/27/2014   GERD (gastroesophageal reflux disease) 01/27/2014   Nonspecific (abnormal) findings on radiological and other examination of gastrointestinal tract 01/31/2014   Shortness of breath 02/05/2014   HTN (hypertension) 02/05/2014   Anemia 02/05/2014   SOB (shortness of breath) 67/67/2094   Diastolic CHF, acute ([place]) 02/08/2014   COPD (chronic obstructive pulmonary disease) ([place]) 11/16/2017   Hypoxia 02/27/2014   Increased frequency of urination 02/12/2013   Other chronic cystitis without hematuria 02/12/2013   Recurrent UTI 02/12/2013   Thalassemia 11/16/2017   Urge incontinence 02/12/2013   Anemia, unspecified 70/96/2836   Acute diastolic heart failure ([place]) 02/08/2014   Esophageal reflux 01/27/2014   Hypertension 11/16/2017   Hypothyroid 11/16/2017   Urinary tract infection 02/27/2014   Abnormal MRI, lumbar spine (2018) 11/16/2017   L2 compression fracture 11/16/2017   H/O cervical spine surgery (C4-C7 ACDF) 11/16/2017   Abnormal MRI, cervical spine (2014) 11/16/2017   Lumbar facet joint osteoarthritis (Bilateral) 11/16/2017   Lumbar facet hypertrophy (Bilateral) 11/16/2017   Lumbar  facet syndrome (Bilateral) 11/16/2017   Osteoarthritis of lumbar spine 11/16/2017   Lumbar spondylosis 11/16/2017   Cervical spondylosis 11/16/2017   Cervical facet arthropathy (Bilateral) 11/16/2017   Chronic pain syndrome 11/16/2017   Osteoarthritis 11/16/2017   Lumbar Grade 1 Anterolisthesis of L3/L4 11/16/2017   Lumbosacral Grade 1 Retrolisthesis of L5/S1 11/16/2017   Lumbar lateral recess stenosis (Bilateral) 11/16/2017   Chronic lower extremity pain (2ry area of Pain) (Bilateral) (R>L) 11/16/2017   Chronic lower extremity radicular pain (Bilateral) (R>L) 11/16/2017   Chronic lumbar radiculitis (Right) 11/16/2017   DDD (degenerative disc disease), lumbar 11/16/2017   DDD (degenerative disc disease), cervical 11/16/2017   Frequent falls 12/11/2017   Gait instability 12/11/2017   Disorder of skeletal system 01/01/2018   Pharmacologic therapy 01/01/2018   Problems influencing health status 01/01/2018   Cervicalgia 01/01/2018   Chronic upper back pain 01/01/2018   Lower extremity weakness (Bilateral) 01/01/2018   Lower extremity weakness 04/04/2018   Lower extremity numbness 04/04/2018   Complaints of weakness of lower extremity 07/17/2018   Osteopenia of lumbar spine 08/06/2018   Spondylosis without myelopathy or radiculopathy, lumbar region 08/09/2018   Chronic low back pain (1ry area of Pain) (Bilateral) (R>L) w/o sciatica 01/07/2021   Closed compression fracture of L3 lumbar vertebra, initial encounter ([place]) 02/16/2021  Perforated diverticulum of intestine 02/14/2022   CHF (congestive heart failure) (HCC) 02/14/2022   Chronic diastolic CHF (congestive heart failure) ([place]) 02/14/2022   Abdominal pain 02/14/2022   Acute respiratory failure with hypoxia (HCC) 02/14/2022   Pleural effusion on left 02/23/2022   Mixed simple and mucopurulent chronic bronchitis ([place]) 03/15/2022   Spinal enthesopathy ([place]) 03/15/2022   Resolved Ambulatory Problems    Diagnosis Date Noted   HCAP  (healthcare-associated pneumonia) 01/27/2014   Chronic low back pain (1ry area of Pain) (Bilateral) (R>L) w/ sciatica (Bilateral) 11/16/2017   Acute diverticulitis 02/14/2022   Past Medical History:  Diagnosis Date   Asthma    Cancer ([place])    Chronic back pain    Depression    Headache(784.0)    Hypothyroidism    Peripheral vascular disease ([place])    Sepsis ([place]) 01/27/2014   Tendinitis of both rotator cuffs    SOCIAL HX:  Social History   Tobacco Use   Smoking status: Former    Packs/day: 1.00    Years: 30.00    Pack years: 30.00    Types: Cigarettes    Quit date: 04/30/1995    Years since quitting: 26.9   Smokeless tobacco: Never  Substance Use Topics   Alcohol use: No   FAMILY HX:  Family History  Problem Relation Age of Onset   Thalassemia Son    Thalassemia Paternal Aunt       ALLERGIES:  Allergies  Allergen Reactions   Ciprofloxacin Swelling and Rash   Hydrocodone     hallucinations   Oxycodone     hallucinations   Zoloft [Sertraline Hcl]     Burns stomach   Amoxicillin Diarrhea   Sertraline Other (See Comments)    "Burns stomach"   Sulfa Antibiotics     Blisters in mouth   Trazodone     Other reaction(s): Unknown   Tylenol [Acetaminophen]     headaches   Ultram [Tramadol]     headaches   Cefuroxime Axetil Rash     PERTINENT MEDICATIONS:  Outpatient Encounter Medications as of 03/22/2022  Medication Sig   amLODipine (NORVASC) 10 MG tablet Take 10 mg by mouth daily.   Aspirin-Caffeine 1000-65 MG PACK Take 1 packet by mouth 2 (two) times daily.   Cholecalciferol 10 MCG (400 UNIT) CAPS Take by mouth. Take by mouth daily.   DULoxetine (CYMBALTA) 60 MG capsule    fexofenadine (ALLEGRA) 180 MG tablet Take 180 mg by mouth daily.   levothyroxine (SYNTHROID) 88 MCG tablet Take 1 tablet (88 mcg total) by mouth daily before breakfast.   magnesium hydroxide (MILK OF MAGNESIA) 400 MG/5ML suspension Take 30 mLs by mouth at bedtime as needed for mild  constipation.   magnesium oxide (MAG-OX) 400 MG tablet Take 400 mg by mouth daily.   metoprolol (TOPROL-XL) 200 MG 24 hr tablet Take 200 mg by mouth daily.   Multiple Vitamin (MULTIVITAMIN WITH MINERALS) TABS Take 1 tablet by mouth daily.   omeprazole (PRILOSEC) 20 MG capsule Take 20 mg by mouth daily.   VITAMIN E PO Take 500 Units by mouth daily.   No facility-administered encounter medications on file as of 03/22/2022.   Thank you for the opportunity to participate in the care of Ms. Sherrin.  The palliative care team will continue to follow. Please call our office at [phone] if we can be of additional assistance.   Ezekiel Slocumb, NP   COVID-19 PATIENT SCREENING TOOL Asked and negative response unless otherwise noted:  Have you had symptoms of covid, tested positive or been in contact with someone with symptoms/positive test in the past 5-10 days?

## 2022-03-23 ENCOUNTER — Other Ambulatory Visit: Payer: Self-pay | Admitting: Physician Assistant

## 2022-03-23 DIAGNOSIS — S32030A Wedge compression fracture of third lumbar vertebra, initial encounter for closed fracture: Secondary | ICD-10-CM

## 2022-03-24 ENCOUNTER — Other Ambulatory Visit: Payer: Medicare Other

## 2022-03-24 ENCOUNTER — Other Ambulatory Visit: Payer: Self-pay | Admitting: Physician Assistant

## 2022-03-24 DIAGNOSIS — S32030A Wedge compression fracture of third lumbar vertebra, initial encounter for closed fracture: Secondary | ICD-10-CM

## 2022-03-29 ENCOUNTER — Ambulatory Visit
Admission: RE | Admit: 2022-03-29 | Discharge: 2022-03-29 | Disposition: A | Discharge status: Home / Self Care | Payer: Medicare Other | Source: Ambulatory Visit | Attending: Physician Assistant | Admitting: Physician Assistant

## 2022-03-29 DIAGNOSIS — L02211 Cutaneous abscess of abdominal wall: Secondary | ICD-10-CM

## 2022-03-29 HISTORY — PX: IR RADIOLOGIST EVAL & MGMT: IMG5224

## 2022-03-29 NOTE — Progress Notes (Signed)
Chief Complaint: Patient was seen in consultation today for diverticular abscess at the request of McLaughlin,Miriam K  Referring Physician(s): McLaughlin,Miriam K  History of Present Illness: Laura Frey is a 88 y.o. female with a history of diverticular abscess formation.  She had a percutaneous drainage catheter placed on 02/25/2022.  Subsequently, repeat evaluation was performed on 03/10/2022.  While the abscess cavity was largely resolved, there was a persistent fistulous communication between the drainage catheter and the adjacent colon.  Patient presents today for repeat drain injection.  Of note, she is accompanied by her son who is her primary historian.  Drain output is essentially nonexistent.  She has mild soreness around the drainage catheter insertion site.  Her primary complaint today is chronic left hip pain.  She has an MRI scheduled next week to evaluate this.  Denies fever or chills.  Past Medical History:  Diagnosis Date   Anemia    hx Thalassemia minor anemia   Asthma    Cancer (HCC)    melanoma on back   Chronic back pain    ESI/uses BC powders   Depression    GERD (gastroesophageal reflux disease)    Headache(784.0)    Hypertension    Hypothyroidism    Peripheral vascular disease (HCC)    legs   Sepsis ([place]) 01/27/2014   Shortness of breath    Tendinitis of both rotator cuffs    UTI (urinary tract infection)     Past Surgical History:  Procedure Laterality Date   ABDOMINAL HYSTERECTOMY     vagina   ANTERIOR CERVICAL DECOMP/DISCECTOMY FUSION N/A 05/01/2013   Procedure: ANTERIOR CERVICAL DECOMPRESSION/DISCECTOMY FUSION 3 LEVELS;  Surgeon: Elaina Hoops, MD;  Location: [facility];  Service: Neurosurgery;  Laterality: N/A;  ANTERIOR CERVICAL DECOMPRESSION/DISCECTOMY FUSION 3 LEVELS   APPENDECTOMY     BLADDER REPAIR     after hysterectomy same day   EYE SURGERY Bilateral    cataracts   IR RADIOLOGIST EVAL & MGMT  03/10/2022   IR SINUS/FIST TUBE  CHK-NON GI  02/25/2022   TONSILLECTOMY     TUBAL LIGATION      Allergies: Ciprofloxacin, Hydrocodone, Oxycodone, Zoloft [sertraline hcl], Amoxicillin, Sertraline, Sulfa antibiotics, Trazodone, Tylenol [acetaminophen], Ultram [tramadol], and Cefuroxime axetil  Medications: Prior to Admission medications   Medication Sig Start Date End Date Taking? Authorizing Provider  amLODipine (NORVASC) 10 MG tablet Take 10 mg by mouth daily. 02/08/22   [provider]  Aspirin-Caffeine 1000-65 MG PACK Take 1 packet by mouth 2 (two) times daily.    [provider]  Cholecalciferol 10 MCG (400 UNIT) CAPS Take by mouth. Take by mouth daily.    [provider]  DULoxetine (CYMBALTA) 60 MG capsule  05/14/18   [provider]  fexofenadine (ALLEGRA) 180 MG tablet Take 180 mg by mouth daily.    [provider]  levothyroxine (SYNTHROID) 88 MCG tablet Take 1 tablet (88 mcg total) by mouth daily before breakfast. 02/26/22 03/28/22  Max Sane, MD  magnesium hydroxide (MILK OF MAGNESIA) 400 MG/5ML suspension Take 30 mLs by mouth at bedtime as needed for mild constipation. 02/21/22   Lorella Nimrod, MD  magnesium oxide (MAG-OX) 400 MG tablet Take 400 mg by mouth daily.    [provider]  metoprolol (TOPROL-XL) 200 MG 24 hr tablet Take 200 mg by mouth daily.    [provider]  Multiple Vitamin (MULTIVITAMIN WITH MINERALS) TABS Take 1 tablet by mouth daily.    [provider]  omeprazole (PRILOSEC) 20 MG capsule Take 20 mg by mouth daily.    [provider]  VITAMIN E PO Take 500 Units by mouth daily.    [provider]     Family History  Problem Relation Age of Onset   Thalassemia Son    Thalassemia Paternal Aunt     Social History   Socioeconomic History   Marital status: Widowed    Spouse name: Not on file   Number of children: Not on file   Years of education: Not on file   Highest education level: Not on file   Occupational History   Not on file  Tobacco Use   Smoking status: Former    Packs/day: 1.00    Years: 30.00    Total pack years: 30.00    Types: Cigarettes    Quit date: 04/30/1995    Years since quitting: 26.9   Smokeless tobacco: Never  Substance and Sexual Activity   Alcohol use: No   Drug use: No   Sexual activity: Not Currently  Other Topics Concern   Not on file  Social History Narrative   Not on file   Social Determinants of Health   Financial Resource Strain: Not on file  Food Insecurity: Not on file  Transportation Needs: Not on file  Physical Activity: Not on file  Stress: Not on file  Social Connections: Not on file    Review of Systems: A 12 point ROS discussed and pertinent positives are indicated in the HPI above.  All other systems are negative.  Review of Systems  Vital Signs: There were no vitals taken for this visit.  Physical Exam Constitutional:      General: She is not in acute distress.    Appearance: Normal appearance.  HENT:     Head: Normocephalic and atraumatic.  Eyes:     General: No scleral icterus. Cardiovascular:     Rate and Rhythm: Normal rate.  Pulmonary:     Effort: Pulmonary effort is normal.  Abdominal:     General: Abdomen is flat. There is no distension.     Tenderness: There is no abdominal tenderness. There is no guarding.    Skin:    General: Skin is warm and dry.  Neurological:     Mental Status: She is alert and oriented to person, place, and time.  Psychiatric:        Mood and Affect: Mood normal.        Behavior: Behavior normal.       Imaging: DG Sinus/Fist Tube Chk-Non GI  Result Date: 03/10/2022 INDICATION: Perforated diverticulum of intestine EXAM: Injection of percutaneous drainage catheter using fluoroscopy MEDICATIONS: The patient is currently admitted to the hospital and receiving intravenous antibiotics. The antibiotics were administered within an appropriate time frame prior to the initiation  of the procedure. ANESTHESIA/SEDATION: None COMPLICATIONS: None immediate. PROCEDURE: Informed written consent was obtained from the patient after a thorough discussion of the procedural risks, benefits and alternatives. All questions were addressed. Maximal Sterile Barrier Technique was utilized including caps, mask, sterile gowns, sterile gloves, sterile drape, hand hygiene and skin antiseptic. A timeout was performed prior to the initiation of the procedure. The patient was placed supine on the exam table. Injection of the existing left lower quadrant percutaneous drainage catheter was performed using fluoroscopy visualization. Images demonstrate a decompressed abscess cavity. There is brisk filling of a fistulous channel to the adjacent sigmoid colon. Reflux of contrast material is seen into  the distal descending colon. 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 Drain left to bag drainage. Patient will return in 2 weeks for repeat drain injection and check. Electronically Signed   By: Albin Felling M.D.   On: 03/10/2022 14:49   CT ABDOMEN PELVIS W CONTRAST  Result Date: 03/10/2022 CLINICAL DATA:  Diverticulitis status post percutaneous drainage, follow-up EXAM: CT ABDOMEN AND PELVIS WITH CONTRAST TECHNIQUE: Multidetector CT imaging of the abdomen and pelvis was performed using the standard protocol following bolus administration of intravenous contrast. RADIATION DOSE REDUCTION: This exam was performed according to the departmental dose-optimization program which includes automated exposure control, adjustment of the mA and/or kV according to patient size and/or use of iterative reconstruction technique. CONTRAST:  134m ISOVUE-300 IOPAMIDOL (ISOVUE-300) INJECTION 61% COMPARISON:  Multiple priors from may 2023 FINDINGS: Inferior chest: The lung bases are well-aerated. Hepatobiliary: The  liver is normal in size without focal abnormality. No intrahepatic or extrahepatic biliary ductal dilation. Cholelithiasis. Spleen: Normal in size without focal abnormality. Pancreas: No pancreatic ductal dilatation or surrounding inflammatory changes. Adrenals/Urinary Tract: Adrenal glands are unremarkable. Kidneys are normal, without renal calculi, focal lesion, or hydronephrosis. Bladder is unremarkable. Stomach/Bowel: The stomach and small bowel appear normal. Again seen is extensive diverticular disease primarily of the sigmoid colon and descending colon. A left lower quadrant percutaneous drainage catheters present. It terminates in a decompressed cavity. Again seen is a soft tissue tract suspicious for fistulous connection with the adjacent sigmoid colon (see key image, series 2, image 61). Reproductive: Status post hysterectomy. No adnexal masses. Lymphatic: No enlarged lymph nodes in the abdomen or pelvis. Vasculature: The abdominal aorta is normal in caliber. Atherosclerosis. Other: No abdominopelvic ascites. Musculoskeletal: No aggressive osseous lesions. The soft tissues are unremarkable. IMPRESSION: Extensive diverticular disease status post interval placement of a left lower quadrant percutaneous drainage catheter with appropriate decompression of the abscess cavity. There remains a soft tissue tract which connects to the adjacent sigmoid colon, suspicious for fistulous connection. Attention on forthcoming drain injection study. Electronically Signed   By: YAlbin FellingM.D.   On: 03/10/2022 14:27   IR Radiologist Eval & Mgmt  Result Date: 03/10/2022 EXAM: ESTABLISHED PATIENT OFFICE VISIT CHIEF COMPLAINT: See drain injection HISTORY OF PRESENT ILLNESS: See drain injection REVIEW OF SYSTEMS: See drain injection PHYSICAL EXAMINATION: See drain injection ASSESSMENT AND PLAN: See drain injection Electronically Signed   By: YAlbin FellingM.D.   On: 03/10/2022 14:14    Labs:  CBC: Recent Labs     02/17/22 0535 02/18/22 0454 02/23/22 0928 02/24/22 0410  WBC 7.4 6.5 14.0* 11.8*  HGB 10.6* 10.1* 11.0* 10.5*  HCT 33.6* 33.0* 35.3* 33.8*  PLT 256 270 387 419*    COAGS: No results for input(s): "INR", "APTT" in the last 8760 hours.  BMP: Recent Labs    02/19/22 0629 02/20/22 0507 02/23/22 0928 02/24/22 0410  NA 138 139 134* 137  K 3.5 3.6 4.5 4.5  CL 102 104 96* 98  CO2 30 30 32 33*  GLUCOSE 107* 109* 119* 169*  BUN 5* <5* 5* 10  CALCIUM 8.6* 8.3* 8.6* 8.8*  CREATININE 0.55 0.56 0.57 0.62  GFRNONAA >60 >60 >60 >60    LIVER FUNCTION TESTS: Recent Labs    02/13/22 2045 02/14/22 0523  BILITOT 1.2 1.3*  AST 29 20  ALT 18 16  ALKPHOS 79 66  PROT 7.1 6.3*  ALBUMIN 3.9  3.5    TUMOR MARKERS: No results for input(s): "AFPTM", "CEA", "CA199", "CHROMGRNA" in the last 8760 hours.  Assessment and Plan:  Pleasant 88 year old female with a history of diverticular abscess status post percutaneous drain placement.  The abscess and associated fistula have resolved.  Patient is clinically improved.  The drainage catheter was removed.  No further scheduled follow-up  Electronically Signed: Criselda Peaches 03/29/2022, 2:43 PM   I spent a total of  15 Minutes in face to face in clinical consultation, greater than 50% of which was counseling/coordinating care for diverticular abscess.

## 2022-04-02 ENCOUNTER — Emergency Department: Payer: Medicare Other

## 2022-04-02 ENCOUNTER — Emergency Department
Admission: EM | Admit: 2022-04-02 | Discharge: 2022-04-02 | Disposition: A | Discharge status: Home / Self Care | Payer: Medicare Other | Attending: Emergency Medicine | Admitting: Emergency Medicine

## 2022-04-02 DIAGNOSIS — S060X0A Concussion without loss of consciousness, initial encounter: Secondary | ICD-10-CM | POA: Insufficient documentation

## 2022-04-02 DIAGNOSIS — W19XXXA Unspecified fall, initial encounter: Secondary | ICD-10-CM

## 2022-04-02 DIAGNOSIS — S0101XA Laceration without foreign body of scalp, initial encounter: Secondary | ICD-10-CM | POA: Diagnosis not present

## 2022-04-02 DIAGNOSIS — S0990XA Unspecified injury of head, initial encounter: Secondary | ICD-10-CM | POA: Diagnosis present

## 2022-04-02 DIAGNOSIS — N3000 Acute cystitis without hematuria: Secondary | ICD-10-CM

## 2022-04-02 LAB — URINALYSIS, ROUTINE W REFLEX MICROSCOPIC
Bilirubin Urine: NEGATIVE
Glucose, UA: NEGATIVE mg/dL
Hgb urine dipstick: NEGATIVE
Ketones, ur: NEGATIVE mg/dL
Nitrite: POSITIVE — AB
Protein, ur: 30 mg/dL — AB
Specific Gravity, Urine: 1.01 (ref 1.005–1.030)
pH: 5 (ref 5.0–8.0)

## 2022-04-02 LAB — URINALYSIS, MICROSCOPIC (REFLEX)
Squamous Epithelial / HPF: NONE SEEN (ref 0–5)
WBC, UA: >50 WBC/hpf (ref 0–5)

## 2022-04-02 MED ORDER — CYCLOBENZAPRINE HCL 5 MG PO TABS: 5.0000 mg | ORAL_TABLET | Freq: Three times a day (TID) | ORAL | 0 refills | Status: DC | PRN

## 2022-04-02 MED ORDER — LIDOCAINE 5 % EX PTCH: 1.0000 | MEDICATED_PATCH | Freq: Two times a day (BID) | CUTANEOUS | 0 refills | Status: DC

## 2022-04-02 MED ORDER — CEPHALEXIN 500 MG PO CAPS
500.0000 mg | ORAL_CAPSULE | Freq: Once | ORAL | Status: AC
  Administered 2022-04-02: 500 mg via ORAL
  Filled 2022-04-02: qty 1

## 2022-04-02 MED ORDER — CEPHALEXIN 500 MG PO CAPS
500.0000 mg | ORAL_CAPSULE | Freq: Four times a day (QID) | ORAL | 0 refills | Status: AC
Start: 2022-04-02 — End: 2022-04-09

## 2022-04-02 NOTE — ED Provider Notes (Signed)
[facility] Provider Note   Event Date/Time   First MD Initiated Contact with Patient 04/02/22 1337     (approximate) History  Fall  HPI Sallyanne Birkhead is a 88 y.o. female  Location: Head Duration: Just prior to arrival Timing: Stable since onset Severity: 10/10 Quality: Aching pain Context: Patient states that she stood up from a seated position and attempted to use her walker when she lost her balance and ended up striking the left posterior temporal aspect of her head on a electronic keyboard Modifying factors: Any pressure along her head worsens this pain and is partially relieved at rest Associated Symptoms: Right hip pain, denies LOC ROS: Patient currently denies any vision changes, tinnitus, difficulty speaking, facial droop, sore throat, chest pain, shortness of breath, abdominal pain, nausea/vomiting/diarrhea, dysuria, or weakness/numbness/paresthesias in any extremity   Physical Exam  Triage Vital Signs: ED Triage Vitals  Enc Vitals Group     BP 04/02/22 1340 136/89     Pulse Rate 04/02/22 1340 88     Resp 04/02/22 1340 18     Temp 04/02/22 1340 (!) 97.5 F (36.4 C)     Temp Source 04/02/22 1340 Oral     SpO2 04/02/22 1340 91 %     Weight --      Height --      Head Circumference --      Peak Flow --      Pain Score 04/02/22 1339 10     Pain Loc --      Pain Edu? --      Excl. in [place]? --    Most recent vital signs: Vitals:   04/02/22 1651 04/02/22 1818  BP: 135/78 121/73  Pulse: 88 92  Resp: 17 17  Temp: 98.5 F (36.9 C) 97.6 F (36.4 C)  SpO2: 98% 99%   General: Awake, oriented x4. CV:  Good peripheral perfusion.  Resp:  Normal effort.  Abd:  No distention.  Other:  Elderly Caucasian female laying in bed in no acute distress with bandage about the scalp.  When removed she has a small subcentimeter vertical superficial laceration to the left posterior temporal area ED Results / Procedures / Treatments  Labs (all labs  ordered are listed, but only abnormal results are displayed) Labs Reviewed  URINALYSIS, ROUTINE W REFLEX MICROSCOPIC - Abnormal; Notable for the following components:      Result Value   APPearance CLOUDY (*)    Protein, ur 30 (*)    Nitrite POSITIVE (*)    Leukocytes,Ua LARGE (*)    All other components within normal limits  URINALYSIS, MICROSCOPIC (REFLEX) - Abnormal; Notable for the following components:   Bacteria, UA MANY (*)    All other components within normal limits  URINE CULTURE    RADIOLOGY ED MD interpretation: CT of the head without contrast interpreted by me shows no evidence of acute abnormalities including no intracerebral hemorrhage, obvious masses, or significant edema  CT of the cervical spine interpreted by me does not show any evidence of acute abnormalities including no acute fracture, malalignment, height loss, or dislocation -Agree with radiology assessment Official radiology report(s): No results found. PROCEDURES: Critical Care performed: No Procedures MEDICATIONS ORDERED IN ED: Medications  cephALEXin (KEFLEX) capsule 500 mg (500 mg Oral Given 04/02/22 1817)   IMPRESSION / MDM / ASSESSMENT AND PLAN / ED COURSE  I reviewed the triage vital signs and the nursing notes.  The patient is on the cardiac monitor to evaluate for evidence of arrhythmia and/or significant heart rate changes. Patient's presentation is most consistent with acute presentation with potential threat to life or bodily function. Patient presenting with head trauma.  Patient's neurological exam was non-focal and unremarkable.  [place] Head CT Rule was applied and patient did not fall into the low risk category so a head CT was obtained.  This showed no significant findings.  At this time, it is felt that the most likely explanation for the patient's symptoms is concussion.   I also considered SAH, SDH, Epidural Hematoma, IPH, skull fracture, migraine but this  appears less likely considering the data gathered thus far.   Patient provided CT of the cervical spine that also did not show any evidence of acute abnormalities.   Patient remained stable and neurologically intact while in the emergency department.  Discussed warning signs that would prompt return to ED.  Head trauma handout was provided.  Discussed in detail concussion management.  No sports or strenuous activity until symptoms free.  Return to emergency department urgently if new or worsening symptoms develop.    Impression:  Concussion Superficial scalp laceration UTI  Rx: Keflex Plan  Discharge from ED Tylenol for pain control. Avoid aspirin, NSAIDs, or other blood thinners. Advised patient on supportive measures for cognitive rest - avoid use of cognitive function for at least 24 hours.  This means no tv, books, texting, computers, etc. Limit visitors to the house.  Head trauma instructions provided in discharge instructions Instructed Pt to monitor for neurologic symptoms, severe HA, change in mental status, seizures, loss of conciousness. Instructed Pt to f/up w/ PCP in 3-5 days or ETC should symptoms worsen or not improve. Pt verbally expressed understanding and all questions were addressed to Pt's satisfaction.   FINAL CLINICAL IMPRESSION(S) / ED DIAGNOSES   Final diagnoses:  Fall, initial encounter  Injury of head, initial encounter  Laceration of scalp, initial encounter  Acute cystitis without hematuria   Rx / DC Orders   ED Discharge Orders          Ordered    cephALEXin (KEFLEX) 500 MG capsule  4 times daily        04/02/22 1836    lidocaine (LIDODERM) 5 %  Every 12 hours        04/02/22 1836    cyclobenzaprine (FLEXERIL) 5 MG tablet  3 times daily PRN        04/02/22 1836           Note:  This document was prepared using Dragon voice recognition software and may include unintentional dictation errors.   Naaman Plummer, MD 04/03/22 1530

## 2022-04-02 NOTE — ED Provider Notes (Signed)
-----------------------------------------   6:36 PM on 04/02/2022 ----------------------------------------- Care assumed from Dr. Cheri Fowler pending urinalysis results after patient presented for fall.  CT imaging of head and neck are unremarkable, no apparent traumatic injury from fall.  Urinalysis does appear concerning for UTI and we will send for culture.  She recently completed a course of Macrobid and unfortunately antibiotic options are limited given patient's multiple allergies.  It does appear she has tolerated Keflex in the past despite listed allergies.  She was given a dose of Keflex here in the ED without any reaction and is appropriate for outpatient follow-up with her PCP.  She does deal with chronic sciatica down her right leg and we will prescribe Lidoderm patch as well as muscle relaxants to use as needed.  Daughter counseled to have patient follow-up with her PCP and to return to the ED for new or worsening symptoms, patient and daughter agree with plan.   [name], MD 04/02/22 Bosie Helper

## 2022-04-02 NOTE — ED Triage Notes (Signed)
Pt comes ems from home with unwitnessed fall. Son came into room immediately and she was Aox4. Pt denies any LOC. C/o right hip pain but does have some chronic hip pain. Pt states her legs got weak and went out from under her. Pt on chronic 2L [place]. Denies CP, dizziness, n/v/d.

## 2022-04-05 LAB — URINE CULTURE: Culture: >=100000 — AB

## 2022-04-06 ENCOUNTER — Ambulatory Visit
Admission: RE | Admit: 2022-04-06 | Discharge: 2022-04-06 | Disposition: A | Discharge status: Home / Self Care | Payer: Medicare Other | Source: Ambulatory Visit | Attending: Physician Assistant | Admitting: Physician Assistant

## 2022-04-06 DIAGNOSIS — S32030A Wedge compression fracture of third lumbar vertebra, initial encounter for closed fracture: Secondary | ICD-10-CM | POA: Insufficient documentation

## 2022-04-24 NOTE — Progress Notes (Signed)
PROVIDER NOTE: Information contained herein reflects review and annotations entered in association with encounter. Interpretation of such information and data should be left to medically-trained personnel. Information provided to patient can be located elsewhere in the medical record under "Patient Instructions". Document created using STT-dictation technology, any transcriptional errors that may result from process are unintentional.    Patient: Laura Frey  Service Category: E/M  Provider: Gaspar Cola, MD  DOB: 06/20/1932  DOS: 04/27/2022  Specialty: Interventional Pain Management  MRN: [id number]  Setting: Ambulatory outpatient  PCP: Marinda Elk, MD  Type: Established Patient    Referring Provider: Marinda Elk, MD  Location: Office  Delivery: Face-to-face     HPI  Laura Frey, a 88 y.o. year old female, is here today because of her No primary diagnosis found.. Laura Frey primary complain today is No chief complaint on file. Last encounter: My last encounter with her was on Visit date not found. Pertinent problems: Laura Frey has Abnormal MRI, lumbar spine (2018); L2 compression fracture; H/O cervical spine surgery (C4-C7 ACDF); Abnormal MRI, cervical spine (2014); Lumbar facet joint osteoarthritis (Bilateral); Lumbar facet hypertrophy (Bilateral); Lumbar facet syndrome (Bilateral); Osteoarthritis of lumbar spine; Lumbar spondylosis; Cervical spondylosis; Cervical facet arthropathy (Bilateral); Chronic pain syndrome; Osteoarthritis; Lumbar Grade 1 Anterolisthesis of L3/L4; Lumbosacral Grade 1 Retrolisthesis of L5/S1; Lumbar lateral recess stenosis (Bilateral); Chronic lower extremity pain (2ry area of Pain) (Bilateral) (R>L); Chronic lower extremity radicular pain (Bilateral) (R>L); Chronic lumbar radiculitis (Right); DDD (degenerative disc disease), lumbar; DDD (degenerative disc disease), cervical; Gait instability; Cervicalgia; Chronic upper back pain; Lower  extremity weakness (Bilateral); Lower extremity weakness; Lower extremity numbness; Complaints of weakness of lower extremity; Osteopenia of lumbar spine; Spondylosis without myelopathy or radiculopathy, lumbar region; Chronic low back pain (1ry area of Pain) (Bilateral) (R>L) w/o sciatica; and Closed compression fracture of L3 lumbar vertebra, initial encounter ([place]) on their pertinent problem list. Pain Assessment: Severity of   is reported as a  /10. Location:    / . Onset:  . Quality:  . Timing:  . Modifying factor(s):  Marland Kitchen Vitals:  vitals were not taken for this visit.   Reason for encounter:  *** . ***  Pharmacotherapy Assessment  Analgesic: No opioid analgesics prescribed by our practice.   Monitoring: [place] PMP: PDMP reviewed during this encounter.       Pharmacotherapy: No side-effects or adverse reactions reported. Compliance: No problems identified. Effectiveness: Clinically acceptable.  No notes on file  UDS:  No results found for: "SUMMARY"   ROS  Constitutional: Denies any fever or chills Gastrointestinal: No reported hemesis, hematochezia, vomiting, or acute GI distress Musculoskeletal: Denies any acute onset joint swelling, redness, loss of ROM, or weakness Neurological: No reported episodes of acute onset apraxia, aphasia, dysarthria, agnosia, amnesia, paralysis, loss of coordination, or loss of consciousness  Medication Review  Aspirin-Caffeine, Cholecalciferol, DULoxetine, Vitamin E, amLODipine, cyclobenzaprine, fexofenadine, levothyroxine, lidocaine, magnesium hydroxide, magnesium oxide, metoprolol, multivitamin with minerals, and omeprazole  History Review  Allergy: Laura Frey is allergic to ciprofloxacin, hydrocodone, oxycodone, zoloft [sertraline hcl], amoxicillin, sertraline, sulfa antibiotics, trazodone, tylenol [acetaminophen], ultram [tramadol], and cefuroxime axetil. Drug: Laura Frey  reports no history of drug use. Alcohol:  reports no history of alcohol  use. Tobacco:  reports that she quit smoking about 27 years ago. Her smoking use included cigarettes. She has a 30.00 pack-year smoking history. She has never used smokeless tobacco. Social: Laura Frey  reports that she quit smoking about 27 years ago.  Her smoking use included cigarettes. She has a 30.00 pack-year smoking history. She has never used smokeless tobacco. She reports that she does not drink alcohol and does not use drugs. Medical:  has a past medical history of Anemia, Asthma, Cancer ([place]), Chronic back pain, Depression, GERD (gastroesophageal reflux disease), Headache(784.0), Hypertension, Hypothyroidism, Peripheral vascular disease ([place]), Sepsis ([place]) (01/27/2014), Shortness of breath, Tendinitis of both rotator cuffs, and UTI (urinary tract infection). Surgical: Laura Frey  has a past surgical history that includes Tubal ligation; Appendectomy; Tonsillectomy; Eye surgery (Bilateral); Abdominal hysterectomy; Bladder repair; Anterior cervical decomp/discectomy fusion (N/A, 05/01/2013); IR Sinus/Fist Tube Chk-Non GI (02/25/2022); IR Radiologist Eval & Mgmt (03/10/2022); and IR Radiologist Eval & Mgmt (03/29/2022). Family: family history includes Thalassemia in her paternal aunt and son.  Laboratory Chemistry Profile   Renal Lab Results  Component Value Date   BUN 10 02/24/2022   CREATININE 0.62 02/24/2022   BCR 19 01/01/2018   GFRAA 62 01/01/2018   GFRNONAA >60 02/24/2022    Hepatic Lab Results  Component Value Date   AST 20 02/14/2022   ALT 16 02/14/2022   ALBUMIN 3.5 02/14/2022   ALKPHOS 66 02/14/2022    Electrolytes Lab Results  Component Value Date   NA 137 02/24/2022   K 4.5 02/24/2022   CL 98 02/24/2022   CALCIUM 8.8 (L) 02/24/2022   MG 1.8 02/16/2022    Bone Lab Results  Component Value Date   25OHVITD1 29 (L) 01/01/2018   25OHVITD2 <1.0 01/01/2018   25OHVITD3 28 01/01/2018    Inflammation (CRP: Acute Phase) (ESR: Chronic Phase) Lab Results  Component Value Date    CRP 1.0 01/01/2018   ESRSEDRATE 12 01/01/2018   LATICACIDVEN 0.7 02/15/2022         Note: Above Lab results reviewed.  Recent Imaging Review  MR LUMBAR SPINE WO CONTRAST CLINICAL DATA:  Closed compression fracture of L3 vertebra, initial encounter ([place]) S32.030A (ICD-10-CM).  EXAM: MRI LUMBAR SPINE WITHOUT CONTRAST  TECHNIQUE: Multiplanar, multisequence MR imaging of the lumbar spine was performed. No intravenous contrast was administered.  COMPARISON:  MRI of the lumbar spine February 10, 2021  FINDINGS: Segmentation:  Standard.  Alignment:  Trace anterolisthesis at L3-4.  Vertebrae: No acute fracture, evidence of discitis, or aggressive bone lesion. Chronic compression fractures of the superior endplates of L2 and L3 with associated Schmorl nodes resulting in approximately 35% height loss involve levels all without retropulsion.  Conus medullaris and cauda equina: Conus extends to the L1 level. Conus and cauda equina appear normal.  Paraspinal and other soft tissues: Paraspinal muscle atrophy.  Disc levels:  T12-L1: Facet degenerative changes. No significant spinal canal or neural foraminal stenosis.  L1-2: Disc bulge and mild-to-moderate facet degenerative change with ligamentum flavum redundancy without significant spinal canal or neural foraminal stenosis.  L2-3: Disc bulge and moderate facet degenerative changes with ligamentum flavum redundancy resulting in mild spinal canal stenosis and mild bilateral neural foraminal narrowing.  L3-4: Disc bulge, advanced hypertrophic facet degenerative changes ligamentum flavum redundancy resulting in moderate spinal canal stenosis with narrowing of the bilateral subarticular zones. No significant neural foraminal narrowing.  L4-5: Disc bulge with superimposed inferiorly migrating right central disc extrusion, moderate facet degenerative changes and ligamentum flavum redundancy resulting in severe spinal  canal stenosis and mild bilateral neural foraminal narrowing. This has developed since prior MRI.  L5-S1: Loss of disc height, disc bulge with associated osteophytic component and superimposed central and left foraminal disc protrusions. Moderate facet degenerative changes and ligamentum  flavum redundancy. Findings result in moderate spinal canal stenosis with effacement of the bilateral subarticular zones, moderate right and severe left neural foraminal narrowing. Findings are unchanged since prior MRI.  IMPRESSION: 1. Degenerative changes at L4-5 with interval development of a and inferiorly migrating right central disc extrusion resulting in severe spinal canal stenosis at this level. 2. At L3-4, moderate spinal canal stenosis with narrowing of the bilateral subarticular zones. 3. At L5-S1, moderate spinal canal stenosis with effacement of the bilateral subarticular zones, moderate right and severe left neural foraminal narrowing.  Electronically Signed   By: Pedro Earls M.D.   On: 04/07/2022 14:34 Note: Reviewed        Physical Exam  General appearance: Well nourished, well developed, and well hydrated. In no apparent acute distress Mental status: Alert, oriented x 3 (person, place, & time)       Respiratory: No evidence of acute respiratory distress Eyes: PERLA Vitals: There were no vitals taken for this visit. BMI: Estimated body mass index is 27.8 kg/m as calculated from the following:   Height as of 03/15/22: '5\' 2"'  (1.575 m).   Weight as of 03/15/22: 152 lb (68.9 kg). Ideal: Patient weight not recorded  Assessment   Diagnosis Status  No diagnosis found. Controlled Controlled Controlled   Updated Problems: No problems updated.  Plan of Care  Problem-specific:  No problem-specific Assessment & Plan notes found for this encounter.  Laura Frey has a current medication list which includes the following long-term medication(s):  amlodipine, duloxetine, fexofenadine, levothyroxine, metoprolol, and omeprazole.  Pharmacotherapy (Medications Ordered): No orders of the defined types were placed in this encounter.  Orders:  No orders of the defined types were placed in this encounter.  Follow-up plan:   No follow-ups on file.     Interventional Therapies  Risk  Complexity Considerations:   Estimated body mass index is 29.45 kg/m as calculated from the following:   Height as of 02/16/21: '5\' 2"'  (1.575 m).   Weight as of 02/16/21: 161 lb (73 kg). Patient is initially referred as a  "Fast-Track".  Poor compliance with follow-up evaluations after procedures.  (02/27/2018; 10/30/2018)    Planned  Pending:   Therapeutic bilateral lumbar facet RFA #1 (starting on the left side and following up 2 weeks later with the right side.)   Under consideration:   Diagnostic right L3-4 LESI #3  Diagnostic bilateral L3, L4 and/or L5 TESI  Diagnostic bilateral L2-3 transforaminal ESI  Diagnostic bilateral lumbar facet MBB #3  Diagnostic cervical facet MBB  Diagnostic midline Cervical ESI    Completed:   Diagnostic bilateral lumbar facet MBB x2 (10/30/2018) (100/100/100/25) Therapeutic midline-right L2-3 LESI x2 (07/17/2018) (100/100/100/75-100) (100/100/90/50)  Therapeutic right L3-4 LESI x2 (02/27/2018) (80/80/80/>50)   Therapeutic  Palliative (PRN) options:   Palliative right L3-4 LESI #3      Recent Visits No visits were found meeting these conditions. Showing recent visits within past 90 days and meeting all other requirements Future Appointments Date Type Provider Dept  04/27/22 Appointment Milinda Pointer, MD [facility]  Showing future appointments within next 90 days and meeting all other requirements  I discussed the assessment and treatment plan with the patient. The patient was provided an opportunity to ask questions and all were answered. The patient agreed with the plan and demonstrated an  understanding of the instructions.  Patient advised to call back or seek an in-person evaluation if the symptoms or condition worsens.  Duration of encounter: ***  minutes.  Total time on encounter, as per AMA guidelines included both the face-to-face and non-face-to-face time personally spent by the physician and/or other qualified health care professional(s) on the day of the encounter (includes time in activities that require the physician or other qualified health care professional and does not include time in activities normally performed by clinical staff). Physician's time may include the following activities when performed: preparing to see the patient (eg, review of tests, pre-charting review of records) obtaining and/or reviewing separately obtained history performing a medically appropriate examination and/or evaluation counseling and educating the patient/family/caregiver ordering medications, tests, or procedures referring and communicating with other health care professionals (when not separately reported) documenting clinical information in the electronic or other health record independently interpreting results (not separately reported) and communicating results to the patient/ family/caregiver care coordination (not separately reported)  Note by: Gaspar Cola, MD Date: 04/27/2022; Time: 3:48 PM

## 2022-04-26 ENCOUNTER — Ambulatory Visit: Payer: Medicare Other | Admitting: Surgery

## 2022-04-27 ENCOUNTER — Ambulatory Visit (HOSPITAL_BASED_OUTPATIENT_CLINIC_OR_DEPARTMENT_OTHER): Payer: Medicare Other | Admitting: Pain Medicine

## 2022-04-27 DIAGNOSIS — Z91199 Patient's noncompliance with other medical treatment and regimen due to unspecified reason: Secondary | ICD-10-CM

## 2022-05-05 ENCOUNTER — Other Ambulatory Visit: Payer: Self-pay | Admitting: Student

## 2022-05-05 DIAGNOSIS — Z515 Encounter for palliative care: Secondary | ICD-10-CM

## 2022-05-05 DIAGNOSIS — R52 Pain, unspecified: Secondary | ICD-10-CM

## 2022-05-05 DIAGNOSIS — N390 Urinary tract infection, site not specified: Secondary | ICD-10-CM

## 2022-05-05 DIAGNOSIS — R0602 Shortness of breath: Secondary | ICD-10-CM

## 2022-05-05 DIAGNOSIS — R531 Weakness: Secondary | ICD-10-CM

## 2022-05-05 NOTE — Progress Notes (Unsigned)
[facility] Palliative Care Consult Note Telephone: [phone]  Fax: [phone]    Date of encounter: 05/05/22 2:11 PM PATIENT NAME: Laura Frey [address] [place] [address]   [id number] (home)  DOB: January 12, 1932 MRN: [id number] PRIMARY CARE PROVIDER:    Marinda Elk, MD,  [place] [id number] [phone]  REFERRING PROVIDER:   Marinda Elk, MD [place] [facility] [place],   [id number] [phone]  RESPONSIBLE PARTY:    Contact Information     Name Relation Home Work Dustin Daughter [phone]     Skylan, Gift   [phone]   NADRA, HRITZ [phone]          I met face to face with patient and family in the home. Palliative Care was asked to follow this patient by consultation request of  Marinda Elk, MD to address advance care planning and complex medical decision making. This is a follow up visit.                                   ASSESSMENT AND PLAN / RECOMMENDATIONS:   Advance Care Planning/Goals of Care: Goals include to maximize quality of life and symptom management. Patient/health care surrogate gave his/her permission to discuss. Our advance care planning conversation included a discussion about:    The value and importance of advance care planning  Experiences with loved ones who have been seriously ill or have died  Exploration of personal, cultural or spiritual beliefs that might influence medical decisions  Exploration of goals of care in the event of a sudden injury or illness  Daughter Di Kindle is [place].  Reviewed MOST form again today; she will discuss with daughter.  CODE STATUS: Full Code  Symptom Management/Plan:  Pain- patient with chronic back pain, OA. She is followed by pain management; upcoming appointment in August. Continue Ibuprofen PRN and recommend trial for Biofreeze  QID PRN. Continue to use walker for ambulation; monitor for falls/safety.  Generalized weakness-patient still with weakness, requires assistance with adl's. Has caregiver coming in once a week to assist with bath. Family continues to provide supportive care. Recommend restarting PT once pain is managed better.   Shortness of breath-secondary to HF, COPD. Continue oxygen continuously at 2 lpm.   UTI-continue Macrobid 100 mg BID X 7 days. Encourage adequate fluids, cranberry juice.   Follow up Palliative Care Visit: Palliative care will continue to follow for complex medical decision making, advance care planning, and clarification of goals. Return in 8 weeks or prn.   This visit was coded based on medical decision making (MDM).  PPS: 40%  HOSPICE ELIGIBILITY/DIAGNOSIS: TBD  Chief Complaint: Palliative Medicine follow up visit.   HISTORY OF PRESENT ILLNESS:  Laura Frey is a 88 y.o. year old female  with chronic diastolic CHF, hypertension, hypothyroidism, COPD, chronic low back pain, OA, Hx of falls. ED visit on 04/02/22 due to fall; CT of head and neck were unremarkable. MRI of spine on 04/07/22.  Patient currently being treated for UTI; Macrobid 100 mg BID x 7 days. Son reports patient's confusion is improving. She does endorse chronic pain; taking ibuprofen. She missed her pain management appointment. Therapy has ended; she had to stop due to pain. She endorses shortness of breath with exertion; sats drop when she removes oxygen. Patient had drain removed  for diverticulitis of large intestine with perforation. She endorses fair appetite. Patient is leaving today to spend time at her daughter's home.   History obtained from review of EMR, discussion with primary team, and interview with family, facility staff/caregiver and/or Laura Frey.  I reviewed available labs, medications, imaging, studies and related documents from the EMR.  Records reviewed and summarized above.   ROS  A  10-point ROS is negative, except for the pertinent positives/negatives detailed per HPI.  Physical Exam:  Pulse 88, resp 16, b/p 164/80, sats 99% on 2 lpm Constitutional: NAD General: frail appearing EYES: anicteric sclera, lids intact, no discharge  ENMT: intact hearing, oral mucous membranes moist CV: S1S2, RRR, no LE edema Pulmonary: LCTA, no increased work of breathing, no cough Abdomen: normo-active BS + 4 quadrants, soft and non tender GU: deferred MSK: moves all extremities, ambulatory with walker Skin: warm and dry, no rashes or wounds on visible skin Neuro: + generalized weakness,  A & O x 3 Psych: non-anxious affect Hem/lymph/immuno: no widespread bruising   Thank you for the opportunity to participate in the care of Laura Frey.  The palliative care team will continue to follow. Please call our office at [phone] if we can be of additional assistance.   Ezekiel Slocumb, NP   COVID-19 PATIENT SCREENING TOOL Asked and negative response unless otherwise noted:   Have you had symptoms of covid, tested positive or been in contact with someone with symptoms/positive test in the past 5-10 days? No

## 2022-05-29 NOTE — Progress Notes (Signed)
PROVIDER NOTE: Information contained herein reflects review and annotations entered in association with encounter. Interpretation of such information and data should be left to medically-trained personnel. Information provided to patient can be located elsewhere in the medical record under "Patient Instructions". Document created using STT-dictation technology, any transcriptional errors that may result from process are unintentional.    Patient: Laura Frey  Service Category: E/M  Provider: Gaspar Cola, MD  DOB: 07-11-1932  DOS: 05/30/2022  Referring Provider: Marinda Elk, MD  MRN: [id number]  Specialty: Interventional Pain Management  PCP: Marinda Elk, MD  Type: Established Patient  Setting: Ambulatory outpatient    Location: Office  Delivery: Face-to-face     HPI  Laura Frey, a 88 y.o. year old female, is here today because of her Lumbosacral radiculopathy at L5 [M54.17]. Laura Frey primary complain today is Back Pain Last encounter: My last encounter with her was on 04/27/2022. Pertinent problems: Laura Frey has Abnormal MRI, lumbar spine (2018 & 04/07/2022); L2 compression fracture; H/O cervical spine surgery (C4-C7 ACDF); Abnormal MRI, cervical spine (2014); Lumbar facet joint osteoarthritis (Bilateral); Lumbar facet hypertrophy (Bilateral); Lumbar facet syndrome (Bilateral); Osteoarthritis of lumbar spine; Lumbar spondylosis; Cervical spondylosis; Cervical facet arthropathy (Bilateral); Chronic pain syndrome; Osteoarthritis; Lumbar Grade 1 Anterolisthesis of L3/L4; Lumbosacral Grade 1 Retrolisthesis of L5/S1; Lumbar lateral recess stenosis (Bilateral: L3-4, L5-S1); Chronic lower extremity pain (2ry area of Pain) (Bilateral) (R>L); Chronic lower extremity radicular pain (Bilateral) (R>L); Chronic lumbar radiculitis (Right); DDD (degenerative disc disease), lumbar; DDD (degenerative disc disease), cervical; Gait instability; Cervicalgia; Chronic upper back pain;  Lower extremity weakness (Bilateral); Lower extremity weakness; Lower extremity numbness; Complaints of weakness of lower extremity; Osteopenia of lumbar spine; Spondylosis without myelopathy or radiculopathy, lumbar region; Chronic low back pain (1ry area of Pain) (Bilateral) (R>L) w/o sciatica; Closed compression fracture of L3 lumbar vertebra, initial encounter ([place]); Abnormal CT scan, cervical spine (04/02/2022); Lumbosacral radiculopathy at L5 (Right); Lumbar central spinal stenosis (L4-5) w/ neurogenic claudication; Foot drop (Right); and Lumbosacral foraminal stenosis (Bilateral: L2-3, L4-5, L5-S1) (L>R) on their pertinent problem list. Pain Assessment: Severity of Chronic pain is reported as a 6 /10. Location: Back Right/hips bilateral , right  worse  radiates down entire legs bilateral right worse. Onset: More than a month ago. Quality: Aching, Constant, Radiating, Discomfort (Electrial feeling). Timing: Constant. Modifying factor(s): rest, heat, ibprophren. Vitals:  height is '5\' 3"'  (1.6 m) and weight is 153 lb (69.4 kg). Her temperature is 97.2 F (36.2 C) (abnormal). Her blood pressure is 136/60 and her pulse is 87. Her respiration is 16 and oxygen saturation is 99%.   Reason for encounter: evaluation of worsening, or previously known (established) problem.  The patient comes into the clinic today after last having been seen on 03/04/2021.  She describes having an exacerbation of her low back and lower extremity pain.  Today she had a lot of difficulty telling me if the back hurt more than the leg or vice versa.  She fixated on the intensity of the pain rather than the location making it difficult to assess the pain.  Eventually, she volunteered that she was having bilateral lower extremity pain with the right being worse than the left where the pain would go all the way down into the distribution of the L5 nerve root.  She also volunteered that she was having a dropfoot on the right side.  She had a  recent MRI which we have reviewed and based on the patient's signs  and symptoms and the MRI it would seem that her pathology recites primarily on the right side between the L4-5 and L5-S1 regions with the possibility of dual nerve compression secondary to severe central spinal stenosis at the L4-5 level, accompanied by lateral recess stenosis and foraminal stenosis at and below the L4-5 and the L5-S1 levels.  Based on this, I have decided to bring the patient back for a right-sided L4 transforaminal ESI and a right-sided L5-S1 LESI.  The information was shared with the patient and her son who was present during the evaluation.  Pharmacotherapy Assessment  Analgesic: No opioid analgesics prescribed by our practice.   Monitoring:  PMP: PDMP reviewed during this encounter.       Pharmacotherapy: No side-effects or adverse reactions reported. Compliance: No problems identified. Effectiveness: Clinically acceptable.  Ignatius Specking, RN  05/30/2022  1:14 PM  Sign when Signing Visit Safety precautions to be maintained throughout the outpatient stay will include: orient to surroundings, keep bed in low position, maintain call bell within reach at all times, provide assistance with transfer out of bed and ambulation.     UDS:  No results found for: "SUMMARY"   ROS  Constitutional: Denies any fever or chills Gastrointestinal: No reported hemesis, hematochezia, vomiting, or acute GI distress Musculoskeletal: Denies any acute onset joint swelling, redness, loss of ROM, or weakness Neurological: No reported episodes of acute onset apraxia, aphasia, dysarthria, agnosia, amnesia, paralysis, loss of coordination, or loss of consciousness  Medication Review  Cholecalciferol, DULoxetine, Vitamin E, amLODipine, fexofenadine, levothyroxine, magnesium hydroxide, magnesium oxide, metoprolol, multivitamin with minerals, and omeprazole  History Review  Allergy: Laura Frey is allergic to ciprofloxacin,  hydrocodone, oxycodone, zoloft [sertraline hcl], amoxicillin, sertraline, sulfa antibiotics, trazodone, tylenol [acetaminophen], ultram [tramadol], and cefuroxime axetil. Drug: Laura Frey  reports no history of drug use. Alcohol:  reports no history of alcohol use. Tobacco:  reports that she quit smoking about 27 years ago. Her smoking use included cigarettes. She has a 30.00 pack-year smoking history. She has never used smokeless tobacco. Social: Laura Frey  reports that she quit smoking about 27 years ago. Her smoking use included cigarettes. She has a 30.00 pack-year smoking history. She has never used smokeless tobacco. She reports that she does not drink alcohol and does not use drugs. Medical:  has a past medical history of Anemia, Asthma, Cancer ([place]), Chronic back pain, Depression, GERD (gastroesophageal reflux disease), Headache(784.0), Hypertension, Hypothyroidism, Peripheral vascular disease ([place]), Sepsis ([place]) (01/27/2014), Shortness of breath, Tendinitis of both rotator cuffs, and UTI (urinary tract infection). Surgical: Laura Frey  has a past surgical history that includes Tubal ligation; Appendectomy; Tonsillectomy; Eye surgery (Bilateral); Abdominal hysterectomy; Bladder repair; Anterior cervical decomp/discectomy fusion (N/A, 05/01/2013); IR Sinus/Fist Tube Chk-Non GI (02/25/2022); IR Radiologist Eval & Mgmt (03/10/2022); and IR Radiologist Eval & Mgmt (03/29/2022). Family: family history includes Thalassemia in her paternal aunt and son.  Laboratory Chemistry Profile   Renal Lab Results  Component Value Date   BUN 10 02/24/2022   CREATININE 0.62 02/24/2022   BCR 19 01/01/2018   GFRAA 62 01/01/2018   GFRNONAA >60 02/24/2022    Hepatic Lab Results  Component Value Date   AST 20 02/14/2022   ALT 16 02/14/2022   ALBUMIN 3.5 02/14/2022   ALKPHOS 66 02/14/2022    Electrolytes Lab Results  Component Value Date   NA 137 02/24/2022   K 4.5 02/24/2022   CL 98 02/24/2022   CALCIUM 8.8  (L) 02/24/2022   MG 1.8  02/16/2022    Bone Lab Results  Component Value Date   25OHVITD1 29 (L) 01/01/2018   25OHVITD2 <1.0 01/01/2018   25OHVITD3 28 01/01/2018    Inflammation (CRP: Acute Phase) (ESR: Chronic Phase) Lab Results  Component Value Date   CRP 1.0 01/01/2018   ESRSEDRATE 12 01/01/2018   LATICACIDVEN 0.7 02/15/2022         Note: Above Lab results reviewed.  Recent Imaging Review  MR LUMBAR SPINE WO CONTRAST CLINICAL DATA:  Closed compression fracture of L3 vertebra, initial encounter ([place]) S32.030A (ICD-10-CM).  EXAM: MRI LUMBAR SPINE WITHOUT CONTRAST  TECHNIQUE: Multiplanar, multisequence MR imaging of the lumbar spine was performed. No intravenous contrast was administered.  COMPARISON:  MRI of the lumbar spine February 10, 2021  FINDINGS: Segmentation:  Standard.  Alignment:  Trace anterolisthesis at L3-4.  Vertebrae: No acute fracture, evidence of discitis, or aggressive bone lesion. Chronic compression fractures of the superior endplates of L2 and L3 with associated Schmorl nodes resulting in approximately 35% height loss involve levels all without retropulsion.  Conus medullaris and cauda equina: Conus extends to the L1 level. Conus and cauda equina appear normal.  Paraspinal and other soft tissues: Paraspinal muscle atrophy.  Disc levels:  T12-L1: Facet degenerative changes. No significant spinal canal or neural foraminal stenosis.  L1-2: Disc bulge and mild-to-moderate facet degenerative change with ligamentum flavum redundancy without significant spinal canal or neural foraminal stenosis.  L2-3: Disc bulge and moderate facet degenerative changes with ligamentum flavum redundancy resulting in mild spinal canal stenosis and mild bilateral neural foraminal narrowing.  L3-4: Disc bulge, advanced hypertrophic facet degenerative changes ligamentum flavum redundancy resulting in moderate spinal canal stenosis with narrowing of the bilateral  subarticular zones. No significant neural foraminal narrowing.  L4-5: Disc bulge with superimposed inferiorly migrating right central disc extrusion, moderate facet degenerative changes and ligamentum flavum redundancy resulting in severe spinal canal stenosis and mild bilateral neural foraminal narrowing. This has developed since prior MRI.  L5-S1: Loss of disc height, disc bulge with associated osteophytic component and superimposed central and left foraminal disc protrusions. Moderate facet degenerative changes and ligamentum flavum redundancy. Findings result in moderate spinal canal stenosis with effacement of the bilateral subarticular zones, moderate right and severe left neural foraminal narrowing. Findings are unchanged since prior MRI.  IMPRESSION: 1. Degenerative changes at L4-5 with interval development of a and inferiorly migrating right central disc extrusion resulting in severe spinal canal stenosis at this level. 2. At L3-4, moderate spinal canal stenosis with narrowing of the bilateral subarticular zones. 3. At L5-S1, moderate spinal canal stenosis with effacement of the bilateral subarticular zones, moderate right and severe left neural foraminal narrowing.  Electronically Signed   By: Pedro Earls M.D.   On: 04/07/2022 14:34 Note: Reviewed        Physical Exam  General appearance: Well nourished, well developed, and well hydrated. In no apparent acute distress Mental status: Alert, oriented x 3 (person, place, & time)       Respiratory: No evidence of acute respiratory distress Eyes: PERLA Vitals: BP 136/60   Pulse 87   Temp (!) 97.2 F (36.2 C)   Resp 16   Ht '5\' 3"'  (1.6 m)   Wt 153 lb (69.4 kg)   SpO2 99% Comment: 2L  BMI 27.10 kg/m  BMI: Estimated body mass index is 27.1 kg/m as calculated from the following:   Height as of this encounter: '5\' 3"'  (1.6 m).   Weight as of this  encounter: 153 lb (69.4 kg). Ideal: Ideal body weight:  52.4 kg (115 lb 8.3 oz) Adjusted ideal body weight: 59.2 kg (130 lb 8.2 oz)  Assessment   Diagnosis Status  1. Lumbosacral radiculopathy at L5 (Right)   2. Chronic lumbar radiculitis (Right)   3. Foot drop (Right)   4. 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 Lumbar lateral recess stenosis (Bilateral)   6. Lumbosacral foraminal stenosis (Bilateral: L2-3, L4-5, L5-S1) (L>R)   7. Lumbar Grade 1 Anterolisthesis of L3/L4   8. Abnormal MRI, lumbar spine (2018)    Controlled Controlled Controlled   Updated Problems: Problem  Abnormal CT scan, cervical spine (04/02/2022)   (04/02/2022) CERVICAL CT INTERPRETATION: DISC LEVELS:  Prior ACDF from C4-C7. Moderate degenerative disc disease seen at C2-3 and C7-T1. Bilateral facet DJD is seen, greatest at C3-4 with ankylosis noted bilaterally. Atlantoaxial degenerative changes also seen.  IMPRESSION: No acute intracranial abnormality. Cerebral atrophy and chronic small vessel disease. No acute fracture or subluxation of the cervical spine. Prior ACDF from C4-C7. Degenerative spondylosis, as described above.   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 L4-5: mild bilateral neural foraminal narrowing. L5-S1: moderate right and severe left neural foraminal narrowing.   Abnormal MRI, lumbar spine (2018 & 04/07/2022)   (04/07/2022) LUMBAR MRI FINDINGS: Alignment:  Trace anterolisthesis at L3-4. Vertebrae: No acute fracture, evidence of discitis, or aggressive bone lesion. Chronic  compression fractures of the superior endplates of L2 and L3 with associated Schmorl nodes resulting in approximately 35% height loss involve levels all without retropulsion. Paraspinal and other soft tissues: Paraspinal muscle atrophy.   DISC LEVELS: T12-L1: Facet degenerative changes. L1-2: Disc bulge and mild-to-moderate facet degenerative change with ligamentum flavum redundancy without significant spinal canal or neural foraminal stenosis. L2-3: Disc bulge and moderate facet degenerative changes with ligamentum flavum redundancy resulting in mild spinal canal stenosis and mild bilateral neural foraminal narrowing. L3-4: Disc bulge, advanced hypertrophic facet degenerative changes ligamentum flavum redundancy resulting in moderate spinal canal stenosis with narrowing of the bilateral subarticular zones. No significant neural foraminal narrowing. L4-5: Disc bulge with superimposed inferiorly migrating right central disc extrusion, moderate facet degenerative changes and ligamentum flavum redundancy resulting in severe spinal canal stenosis and mild bilateral neural foraminal narrowing. This has developed since prior MRI. L5-S1: Loss of disc height, disc bulge with associated osteophytic component and superimposed central and left foraminal disc protrusions. Moderate facet degenerative changes and ligamentum flavum redundancy. Findings result in moderate spinal canal stenosis with effacement of the bilateral subarticular zones, moderate right and severe left neural foraminal narrowing. Findings are unchanged since prior MRI.  IMPRESSION: 1. Degenerative changes at L4-5 with interval development of a and inferiorly migrating right central disc extrusion resulting in severe spinal canal stenosis at this level. 2. At L3-4, moderate spinal canal stenosis with narrowing of the bilateral subarticular zones. 3. At L5-S1, moderate spinal canal stenosis with effacement of the bilateral subarticular zones,  moderate right and severe left neural foraminal narrowing. _______________________________________________________  (10/12/2017) LUMBAR MRI FINDINGS: Alignment:  Mild anterolisthesis L3-4.  Mild retrolisthesis L5-S1 Vertebrae: Mild compression fracture superior endplate of L2  with bone marrow edema suggesting recent fracture and healing. This appears benign. Superior endplate fracture of L4 on the right appears chronic.  DISC LEVELS: T12-L1:  Mild disc degeneration and Schmorl's node without stenosis L1-2:  Disc bulging and facet hypertrophy with mild spinal stenosis L2-3:  Mild disc and facet degeneration with mild spinal stenosis L3-4: Mild anterolisthesis. Diffuse disc bulging and moderate facet hypertrophy. Moderate spinal stenosis with compression of the thecal sac. Progression of spinal stenosis since 2013 L4-5: Central disc protrusion with diffuse disc bulging. Bilateral facet hypertrophy with moderate spinal stenosis. Subarticular stenosis bilaterally. No change from 2013 L5-S1: Diffuse disc bulging and endplate spurring. Moderate spinal stenosis. Moderate subarticular and foraminal stenosis, left greater than right. Left-sided disc protrusion unchanged from 2013.   IMPRESSION: Mild spinal stenosis L1-2 and L2-3 Moderate spinal stenosis L3-4 Moderate spinal stenosis L4-5 with subarticular stenosis bilaterally Moderate spinal stenosis L5-S1 with subarticular and foraminal stenosis left greater than right Mild compression fracture L2 with bone marrow edema suggesting recent or healing fracture.   Lumbar lateral recess stenosis (Bilateral: L3-4, L5-S1)   (04/07/2022) LUMBAR MRI FINDINGS: LEVELS: L3-4: narrowing of the bilateral subarticular zones L5-S1: effacement of the bilateral subarticular zones     Plan of Care  Problem-specific:  No problem-specific Assessment & Plan notes found for this encounter.  Laura Frey has a current medication list which includes the  following long-term medication(s): amlodipine, duloxetine, fexofenadine, metoprolol, omeprazole, and levothyroxine.  Pharmacotherapy (Medications Ordered): No orders of the defined types were placed in this encounter.  Orders:  Orders Placed This Encounter  Procedures   Lumbar Epidural Injection    Standing Status:   Future    Standing Expiration Date:   08/30/2022    Scheduling Instructions:     Procedure: Interlaminar Lumbar Epidural Steroid injection (LESI)  L5-S1     Laterality: Right-sided     Sedation: Patient's choice.     Timeframe: ASAA    Order Specific Question:   Where will this procedure be performed?    Answer:   [facility]   Lumbar Transforaminal Epidural    Standing Status:   Future    Standing Expiration Date:   08/30/2022    Scheduling Instructions:     Laterality: Right-sided     Level(s): L4             Sedation: Patient's choice.     Timeframe: ASAP    Order Specific Question:   Where will this procedure be performed?    Answer:   [facility]   Follow-up plan:   Return for procedure (Clinic): (R) L4 TFESI #1 + (R) L5-S1 LESI #1.     Interventional Therapies  Risk  Complexity Considerations:   Estimated body mass index is 29.45 kg/m as calculated from the following:   Height as of 02/16/21: '5\' 2"'  (1.575 m).   Weight as of 02/16/21: 161 lb (73 kg). Patient is initially referred as a  "Fast-Track".  Poor compliance with follow-up evaluations after procedures.  (02/27/2018; 10/30/2018)    Planned  Pending:   Diagnostic/Therapeutic right L4 TFESI #1 + right L5-S1 LESI #1    Under consideration:   Therapeutic bilateral lumbar facet RFA #1  Diagnostic right L3-4 LESI #3  Diagnostic bilateral L3, L4 and/or L5 TESI  Diagnostic bilateral L2-3 transforaminal ESI  Diagnostic bilateral lumbar facet MBB #3  Diagnostic cervical facet MBB  Diagnostic midline Cervical ESI    Completed:   Diagnostic bilateral lumbar facet  MBB x3 (02/16/2021)  (100/100/100/25)  Therapeutic midline-right L2-3 LESI x2 (07/17/2018) (100/100/100/75-100) (100/100/90/50)  Therapeutic right L3-4 LESI x2 (02/27/2018) (80/80/80/>50)    Therapeutic  Palliative (PRN) options:   Palliative right L3-4 LESI #3      Recent Visits No visits were found meeting these conditions. Showing recent visits within past 90 days and meeting all other requirements Today's Visits Date Type Provider Dept  05/30/22 Office Visit Milinda Pointer, MD [facility]  Showing today's visits and meeting all other requirements Future Appointments Date Type Provider Dept  06/07/22 Appointment Milinda Pointer, MD [facility]  Showing future appointments within next 90 days and meeting all other requirements  I discussed the assessment and treatment plan with the patient. The patient was provided an opportunity to ask questions and all were answered. The patient agreed with the plan and demonstrated an understanding of the instructions.  Patient advised to call back or seek an in-person evaluation if the symptoms or condition worsens.  Duration of encounter: 43 minutes.  Total time on encounter, as per AMA guidelines included both the face-to-face and non-face-to-face time personally spent by the physician and/or other qualified health care professional(s) on the day of the encounter (includes time in activities that require the physician or other qualified health care professional and does not include time in activities normally performed by clinical staff). Physician's time may include the following activities when performed: preparing to see the patient (eg, review of tests, pre-charting review of records) obtaining and/or reviewing separately obtained history performing a medically appropriate examination and/or evaluation counseling and educating the patient/family/caregiver ordering medications, tests, or procedures referring and communicating with other  health care professionals (when not separately reported) documenting clinical information in the electronic or other health record independently interpreting results (not separately reported) and communicating results to the patient/ family/caregiver care coordination (not separately reported)  Note by: Gaspar Cola, MD Date: 05/30/2022; Time: 2:16 PM

## 2022-05-30 ENCOUNTER — Encounter: Payer: Self-pay | Admitting: Pain Medicine

## 2022-05-30 ENCOUNTER — Ambulatory Visit: Payer: Medicare Other | Attending: Pain Medicine | Admitting: Pain Medicine

## 2022-05-30 VITALS — BP 136/60 | HR 87 | Temp 97.2°F | Resp 16 | Ht 63.0 in | Wt 153.0 lb

## 2022-05-30 DIAGNOSIS — M4310 Spondylolisthesis, site unspecified: Secondary | ICD-10-CM | POA: Insufficient documentation

## 2022-05-30 DIAGNOSIS — M48061 Spinal stenosis, lumbar region without neurogenic claudication: Secondary | ICD-10-CM | POA: Insufficient documentation

## 2022-05-30 DIAGNOSIS — M48062 Spinal stenosis, lumbar region with neurogenic claudication: Secondary | ICD-10-CM | POA: Insufficient documentation

## 2022-05-30 DIAGNOSIS — R937 Abnormal findings on diagnostic imaging of other parts of musculoskeletal system: Secondary | ICD-10-CM | POA: Insufficient documentation

## 2022-05-30 DIAGNOSIS — M5416 Radiculopathy, lumbar region: Secondary | ICD-10-CM | POA: Insufficient documentation

## 2022-05-30 DIAGNOSIS — M5417 Radiculopathy, lumbosacral region: Secondary | ICD-10-CM | POA: Insufficient documentation

## 2022-05-30 DIAGNOSIS — M4807 Spinal stenosis, lumbosacral region: Secondary | ICD-10-CM | POA: Insufficient documentation

## 2022-05-30 DIAGNOSIS — M21371 Foot drop, right foot: Secondary | ICD-10-CM | POA: Diagnosis present

## 2022-05-30 NOTE — Progress Notes (Signed)
Safety precautions to be maintained throughout the outpatient stay will include: orient to surroundings, keep bed in low position, maintain call bell within reach at all times, provide assistance with transfer out of bed and ambulation.  

## 2022-05-30 NOTE — Patient Instructions (Addendum)
______________________________________________________________________  Preparing for Procedure with Sedation  NOTICE: Due to recent regulatory changes, starting on May 17, 2021, procedures requiring intravenous (IV) sedation will no longer be performed at the [facility].  These types of procedures are required to be performed at [facility] ambulatory surgery facility.  We are very sorry for the inconvenience.  Procedure appointments are limited to planned procedures: No Prescription Refills. No disability issues will be discussed. No medication changes will be discussed.  Instructions: Oral Intake: Do not eat or drink anything for at least 8 hours prior to your procedure. (Exception: Blood Pressure Medication. See below.) Transportation: A driver is required. You may not drive yourself after the procedure. Blood Pressure Medicine: Do not forget to take your blood pressure medicine with a sip of water the morning of the procedure. If your Diastolic (lower reading) is above 100 mmHg, elective cases will be cancelled/rescheduled. Blood thinners: These will need to be stopped for procedures. Notify our staff if you are taking any blood thinners. Depending on which one you take, there will be specific instructions on how and when to stop it. Diabetics on insulin: Notify the staff so that you can be scheduled 1st case in the morning. If your diabetes requires high dose insulin, take only  of your normal insulin dose the morning of the procedure and notify the staff that you have done so. Preventing infections: Shower with an antibacterial soap the morning of your procedure. Build-up your immune system: Take 1000 mg of Vitamin C with every meal (3 times a day) the day prior to your procedure. Antibiotics: Inform the staff if you have a condition or reason that requires you to take antibiotics before dental procedures. Pregnancy: If you are pregnant, call and cancel the procedure. Sickness: If  you have a cold, fever, or any active infections, call and cancel the procedure. Arrival: You must be in the facility at least 30 minutes prior to your scheduled procedure. Children: Do not bring children with you. Dress appropriately: There is always the possibility that your clothing may get soiled. Valuables: Do not bring any jewelry or valuables.  Reasons to call and reschedule or cancel your procedure: (Following these recommendations will minimize the risk of a serious complication.) Surgeries: Avoid having procedures within 2 weeks of any surgery. (Avoid for 2 weeks before or after any surgery). Flu Shots: Avoid having procedures within 2 weeks of a flu shots. (Avoid for 2 weeks before or after immunizations). Barium: Avoid having a procedure within 7-10 days after having had a radiological study involving the use of radiological contrast. (Myelograms, Barium swallow or enema study). Heart attacks: Avoid any elective procedures or surgeries for the initial 6 months after a "Myocardial Infarction" (Heart Attack). Blood thinners: It is imperative that you stop these medications before procedures. Let us know if you if you take any blood thinner.  Infection: Avoid procedures during or within two weeks of an infection (including chest colds or gastrointestinal problems). Symptoms associated with infections include: Localized redness, fever, chills, night sweats or profuse sweating, burning sensation when voiding, cough, congestion, stuffiness, runny nose, sore throat, diarrhea, nausea, vomiting, cold or Flu symptoms, recent or current infections. It is specially important if the infection is over the area that we intend to treat. Heart and lung problems: Symptoms that may suggest an active cardiopulmonary problem include: cough, chest pain, breathing difficulties or shortness of breath, dizziness, ankle swelling, uncontrolled high or unusually low blood pressure, and/or palpitations. If you are    experiencing any of these symptoms, cancel your procedure and contact your primary care physician for an evaluation.  Remember:  Regular Business hours are:  Monday to Thursday 8:00 AM to 4:00 PM  Provider's Schedule: Francisco Naveira, MD:  Procedure days: Tuesday and Thursday 7:30 AM to 4:00 PM  Bilal Lateef, MD:  Procedure days: Monday and Wednesday 7:30 AM to 4:00 PM ______________________________________________________________________  ____________________________________________________________________________________________  General Risks and Possible Complications  Patient Responsibilities: It is important that you read this as it is part of your informed consent. It is our duty to inform you of the risks and possible complications associated with treatments offered to you. It is your responsibility as a patient to read this and to ask questions about anything that is not clear or that you believe was not covered in this document.  Patient's Rights: You have the right to refuse treatment. You also have the right to change your mind, even after initially having agreed to have the treatment done. However, under this last option, if you wait until the last second to change your mind, you may be charged for the materials used up to that point.  Introduction: Medicine is not an exact science. Everything in Medicine, including the lack of treatment(s), carries the potential for danger, harm, or loss (which is by definition: Risk). In Medicine, a complication is a secondary problem, condition, or disease that can aggravate an already existing one. All treatments carry the risk of possible complications. The fact that a side effects or complications occurs, does not imply that the treatment was conducted incorrectly. It must be clearly understood that these can happen even when everything is done following the highest safety standards.  No treatment: You can choose not to proceed with the  proposed treatment alternative. The "PRO(s)" would include: avoiding the risk of complications associated with the therapy. The "CON(s)" would include: not getting any of the treatment benefits. These benefits fall under one of three categories: diagnostic; therapeutic; and/or palliative. Diagnostic benefits include: getting information which can ultimately lead to improvement of the disease or symptom(s). Therapeutic benefits are those associated with the successful treatment of the disease. Finally, palliative benefits are those related to the decrease of the primary symptoms, without necessarily curing the condition (example: decreasing the pain from a flare-up of a chronic condition, such as incurable terminal cancer).  General Risks and Complications: These are associated to most interventional treatments. They can occur alone, or in combination. They fall under one of the following six (6) categories: no benefit or worsening of symptoms; bleeding; infection; nerve damage; allergic reactions; and/or death. No benefits or worsening of symptoms: In Medicine there are no guarantees, only probabilities. No healthcare provider can ever guarantee that a medical treatment will work, they can only state the probability that it may. Furthermore, there is always the possibility that the condition may worsen, either directly, or indirectly, as a consequence of the treatment. Bleeding: This is more common if the patient is taking a blood thinner, either prescription or over the counter (example: Goody Powders, Fish oil, Aspirin, Garlic, etc.), or if suffering a condition associated with impaired coagulation (example: Hemophilia, cirrhosis of the liver, low platelet counts, etc.). However, even if you do not have one on these, it can still happen. If you have any of these conditions, or take one of these drugs, make sure to notify your treating physician. Infection: This is more common in patients with a compromised  immune system, either due to disease (example:   diabetes, cancer, human immunodeficiency virus [HIV], etc.), or due to medications or treatments (example: therapies used to treat cancer and rheumatological diseases). However, even if you do not have one on these, it can still happen. If you have any of these conditions, or take one of these drugs, make sure to notify your treating physician. Nerve Damage: This is more common when the treatment is an invasive one, but it can also happen with the use of medications, such as those used in the treatment of cancer. The damage can occur to small secondary nerves, or to large primary ones, such as those in the spinal cord and brain. This damage may be temporary or permanent and it may lead to impairments that can range from temporary numbness to permanent paralysis and/or brain death. Allergic Reactions: Any time a substance or material comes in contact with our body, there is the possibility of an allergic reaction. These can range from a mild skin rash (contact dermatitis) to a severe systemic reaction (anaphylactic reaction), which can result in death. Death: In general, any medical intervention can result in death, most of the time due to an unforeseen complication. ____________________________________________________________________________________________ Epidural Steroid Injection Patient Information  Description: The epidural space surrounds the nerves as they exit the spinal cord.  In some patients, the nerves can be compressed and inflamed by a bulging disc or a tight spinal canal (spinal stenosis).  By injecting steroids into the epidural space, we can bring irritated nerves into direct contact with a potentially helpful medication.  These steroids act directly on the irritated nerves and can reduce swelling and inflammation which often leads to decreased pain.  Epidural steroids may be injected anywhere along the spine and from the neck to the low back  depending upon the location of your pain.   After numbing the skin with local anesthetic (like Novocaine), a small needle is passed into the epidural space slowly.  You may experience a sensation of pressure while this is being done.  The entire block usually last less than 10 minutes.  Conditions which may be treated by epidural steroids:  Low back and leg pain Neck and arm pain Spinal stenosis Post-laminectomy syndrome Herpes zoster (shingles) pain Pain from compression fractures  Preparation for the injection:  Do not eat any solid food or dairy products within 8 hours of your appointment.  You may drink clear liquids up to 3 hours before appointment.  Clear liquids include water, black coffee, juice or soda.  No milk or cream please. You may take your regular medication, including pain medications, with a sip of water before your appointment  Diabetics should hold regular insulin (if taken separately) and take 1/2 normal NPH dos the morning of the procedure.  Carry some sugar containing items with you to your appointment. A driver must accompany you and be prepared to drive you home after your procedure.  Bring all your current medications with your. An IV may be inserted and sedation may be given at the discretion of the physician.   A blood pressure cuff, EKG and other monitors will often be applied during the procedure.  Some patients may need to have extra oxygen administered for a short period. You will be asked to provide medical information, including your allergies, prior to the procedure.  We must know immediately if you are taking blood thinners (like Coumadin/Warfarin)  Or if you are allergic to IV iodine contrast (dye). We must know if you could possible be pregnant.  Possible  side-effects: Bleeding from needle site Infection (rare, may require surgery) Nerve injury (rare) Numbness & tingling (temporary) Difficulty urinating (rare, temporary) Spinal headache ( a headache  worse with upright posture) Light -headedness (temporary) Pain at injection site (several days) Decreased blood pressure (temporary) Weakness in arm/leg (temporary) Pressure sensation in back/neck (temporary)  Call if you experience: Fever/chills associated with headache or increased back/neck pain. Headache worsened by an upright position. New onset weakness or numbness of an extremity below the injection site Hives or difficulty breathing (go to the emergency room) Inflammation or drainage at the infection site Severe back/neck pain Any new symptoms which are concerning to you  Please note:  Although the local anesthetic injected can often make your back or neck feel good for several hours after the injection, the pain will likely return.  It takes 3-7 days for steroids to work in the epidural space.  You may not notice any pain relief for at least that one week.  If effective, we will often do a series of three injections spaced 3-6 weeks apart to maximally decrease your pain.  After the initial series, we generally will wait several months before considering a repeat injection of the same type.  If you have any questions, please call [phone] [facility]

## 2022-06-06 ENCOUNTER — Other Ambulatory Visit: Payer: Self-pay

## 2022-06-06 ENCOUNTER — Telehealth: Payer: Self-pay | Admitting: Pain Medicine

## 2022-06-06 NOTE — Telephone Encounter (Signed)
Procedure cancelled and patients son will call back to reschedule.

## 2022-06-06 NOTE — Telephone Encounter (Signed)
Pt is scheduled tomorrow for injections. She has UTI and is on antibiotics. Please advise Son if she should still come

## 2022-06-07 ENCOUNTER — Ambulatory Visit: Payer: Medicare Other | Admitting: Pain Medicine

## 2022-06-21 ENCOUNTER — Ambulatory Visit
Admission: RE | Admit: 2022-06-21 | Discharge: 2022-06-21 | Disposition: A | Discharge status: Home / Self Care | Payer: Medicare Other | Source: Ambulatory Visit | Attending: Pain Medicine | Admitting: Pain Medicine

## 2022-06-21 ENCOUNTER — Encounter: Payer: Self-pay | Admitting: Pain Medicine

## 2022-06-21 ENCOUNTER — Ambulatory Visit: Payer: Medicare Other | Attending: Pain Medicine | Admitting: Pain Medicine

## 2022-06-21 VITALS — BP 149/71 | HR 74 | Temp 97.5°F | Resp 19 | Ht 63.0 in | Wt 153.0 lb

## 2022-06-21 DIAGNOSIS — M4807 Spinal stenosis, lumbosacral region: Secondary | ICD-10-CM | POA: Diagnosis present

## 2022-06-21 DIAGNOSIS — M5410 Radiculopathy, site unspecified: Secondary | ICD-10-CM | POA: Diagnosis present

## 2022-06-21 DIAGNOSIS — M48061 Spinal stenosis, lumbar region without neurogenic claudication: Secondary | ICD-10-CM | POA: Diagnosis present

## 2022-06-21 DIAGNOSIS — M5441 Lumbago with sciatica, right side: Secondary | ICD-10-CM | POA: Diagnosis present

## 2022-06-21 DIAGNOSIS — M5136 Other intervertebral disc degeneration, lumbar region: Secondary | ICD-10-CM | POA: Diagnosis present

## 2022-06-21 DIAGNOSIS — M48062 Spinal stenosis, lumbar region with neurogenic claudication: Secondary | ICD-10-CM

## 2022-06-21 DIAGNOSIS — S32030S Wedge compression fracture of third lumbar vertebra, sequela: Secondary | ICD-10-CM

## 2022-06-21 DIAGNOSIS — M5417 Radiculopathy, lumbosacral region: Secondary | ICD-10-CM | POA: Diagnosis present

## 2022-06-21 DIAGNOSIS — M79604 Pain in right leg: Secondary | ICD-10-CM | POA: Insufficient documentation

## 2022-06-21 DIAGNOSIS — M4310 Spondylolisthesis, site unspecified: Secondary | ICD-10-CM | POA: Diagnosis present

## 2022-06-21 DIAGNOSIS — Z9981 Dependence on supplemental oxygen: Secondary | ICD-10-CM | POA: Insufficient documentation

## 2022-06-21 DIAGNOSIS — G8929 Other chronic pain: Secondary | ICD-10-CM | POA: Diagnosis present

## 2022-06-21 DIAGNOSIS — M51369 Other intervertebral disc degeneration, lumbar region without mention of lumbar back pain or lower extremity pain: Secondary | ICD-10-CM

## 2022-06-21 DIAGNOSIS — M79605 Pain in left leg: Secondary | ICD-10-CM | POA: Diagnosis present

## 2022-06-21 DIAGNOSIS — M5416 Radiculopathy, lumbar region: Secondary | ICD-10-CM

## 2022-06-21 DIAGNOSIS — M5442 Lumbago with sciatica, left side: Secondary | ICD-10-CM | POA: Insufficient documentation

## 2022-06-21 MED ORDER — ROPIVACAINE HCL 2 MG/ML IJ SOLN
2.0000 mL | Freq: Once | INTRAMUSCULAR | Status: AC
  Administered 2022-06-21: 2 mL via EPIDURAL
  Filled 2022-06-21: qty 20

## 2022-06-21 MED ORDER — SODIUM CHLORIDE 0.9% FLUSH
2.0000 mL | Freq: Once | INTRAVENOUS | Status: AC
  Administered 2022-06-21: 2 mL

## 2022-06-21 MED ORDER — MIDAZOLAM HCL 5 MG/5ML IJ SOLN: 0.5000 mg | Freq: Once | INTRAMUSCULAR | Status: DC

## 2022-06-21 MED ORDER — IOHEXOL 180 MG/ML  SOLN
10.0000 mL | Freq: Once | INTRAMUSCULAR | Status: DC
  Filled 2022-06-21: qty 20

## 2022-06-21 MED ORDER — SODIUM CHLORIDE (PF) 0.9 % IJ SOLN
INTRAMUSCULAR | Status: AC
  Filled 2022-06-21: qty 10

## 2022-06-21 MED ORDER — SODIUM CHLORIDE 0.9% FLUSH
1.0000 mL | Freq: Once | INTRAVENOUS | Status: AC
  Administered 2022-06-21: 1 mL

## 2022-06-21 MED ORDER — LACTATED RINGERS IV SOLN: Freq: Once | INTRAVENOUS | Status: DC

## 2022-06-21 MED ORDER — TRIAMCINOLONE ACETONIDE 40 MG/ML IJ SUSP
40.0000 mg | Freq: Once | INTRAMUSCULAR | Status: AC
  Administered 2022-06-21: 40 mg
  Filled 2022-06-21: qty 1

## 2022-06-21 MED ORDER — ROPIVACAINE HCL 2 MG/ML IJ SOLN
1.0000 mL | Freq: Once | INTRAMUSCULAR | Status: AC
  Administered 2022-06-21: 1 mL via EPIDURAL

## 2022-06-21 MED ORDER — PENTAFLUOROPROP-TETRAFLUOROETH EX AERO: INHALATION_SPRAY | Freq: Once | CUTANEOUS | Status: DC

## 2022-06-21 MED ORDER — DEXAMETHASONE SODIUM PHOSPHATE 10 MG/ML IJ SOLN
10.0000 mg | Freq: Once | INTRAMUSCULAR | Status: AC
  Administered 2022-06-21: 10 mg
  Filled 2022-06-21: qty 1

## 2022-06-21 MED ORDER — LIDOCAINE HCL 2 % IJ SOLN
20.0000 mL | Freq: Once | INTRAMUSCULAR | Status: AC
  Administered 2022-06-21: 400 mg
  Filled 2022-06-21: qty 20

## 2022-06-21 NOTE — Patient Instructions (Signed)

## 2022-06-21 NOTE — Progress Notes (Signed)
PROVIDER NOTE: Interpretation of information contained herein should be left to medically-trained personnel. Specific patient instructions are provided elsewhere under "Patient Instructions" section of medical record. This document was created in part using STT-dictation technology, any transcriptional errors that may result from this process are unintentional.  Patient: Laura Frey Type: Established DOB: August 23, 1932 MRN: [id number] PCP: Marinda Elk, MD  Service: Procedure DOS: 06/21/2022 Setting: Ambulatory Location: Ambulatory outpatient facility Delivery: Face-to-face Provider: Gaspar Cola, MD Specialty: Interventional Pain Management Specialty designation: 09 Location: Outpatient facility Ref. Prov.: Marinda Elk, MD    Primary Reason for Visit: Interventional Pain Management Treatment. CC: Neck Pain and Back Pain (R>L)   Procedure(s):     Procedure 1: Type: Lumbar Trans-Foraminal Epidural Steroid Injection (TFESI) #1  Laterality: Right (-RT) Paravertebral Level: L4   Target: Foraminal peri-neural region, subarticular lateral recess area, and anterior intraspinal epidural space.  Procedure 2: Type: Lumbar Epidural Steroid Injection (LESI) (Inter-laminar) #1  Laterality: Right (-RT) Paravertebral Level: L5-S1   Target: Posterior intraspinal epidural space Imaging: Fluoroscopic guidance Anesthesia: Local anesthesia (1-2% Lidocaine) Anxiolysis: None                 Sedation: No Sedation              .  DOS: 06/21/2022  Performed by: Gaspar Cola, MD  Purpose: Diagnostic/Therapeutic Indications: Low back/lower extremity pain severe enough to impact quality of life or function. Rationale (medical necessity): procedure needed and proper for the diagnosis and/or treatment of Ms. Nelon's medical symptoms and needs. 1. DDD (degenerative disc disease), lumbar   2. 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 Lumbar lateral  recess stenosis (Bilateral: L3-4, L5-S1)   4. Lumbar Grade 1 Anterolisthesis of L3/L4   5. Lumbosacral foraminal stenosis (Bilateral: L2-3, L4-5, L5-S1) (L>R)   6. Lumbosacral Grade 1 Retrolisthesis of L5/S1   7. Lumbosacral radiculopathy at L5 (Right)   8. Chronic lower extremity radicular pain (Bilateral) (R>L)   9. Chronic lower extremity pain (2ry area of Pain) (Bilateral) (R>L)   10. Chronic low back pain (1ry area of Pain) (Bilateral) (R>L) w/ sciatica (Bilateral)   11. Closed compression fracture of L3 lumbar vertebra, sequela   12. Oxygen dependent   13. Spinal stenosis, lumbar region, with neurogenic claudication   14. Bilateral stenosis of lateral recess of lumbar spine   15. Anterolisthesis   16. Foraminal stenosis of lumbosacral region   17. Retrolisthesis   18. Lumbosacral radiculopathy at L5   19. Chronic radicular pain of lower extremity   20. Chronic pain of lower extremity, bilateral   21. Chronic bilateral low back pain with bilateral sciatica    NAS-11 Pain score:   Pre-procedure: 5 /10   Post-procedure: 5 /10      Region: Lumbosacral Approach: Percutaneous  Type of procedure: Perineural injection   Position / Prep / Materials:  Position: Prone with head of the table was raised to facilitate breathing.  Prep solution: DuraPrep (Iodine Povacrylex [0.7% available iodine] and Isopropyl Alcohol, 74% w/w) Prep Area: Entire posterior thoracolumbar & lumbosacral region  Procedure 1 Materials:  Tray: Block Needle(s):  Type: Epidural & Spinal  Gauge (G): 22  Length: 5-in  Qty: 1    Materials:  Tray: Epidural tray Needle(s):  Type: Epidural needle (Tuohy) Gauge (G):  17 Length: Regular (3.5-in) Qty: 1     Pre-op H&P Assessment:  Ms. Name is a 88 y.o. (year old), female patient, seen today for  interventional treatment. She  has a past surgical history that includes Tubal ligation; Appendectomy; Tonsillectomy; Eye surgery (Bilateral); Abdominal hysterectomy;  Bladder repair; Anterior cervical decomp/discectomy fusion (N/A, 05/01/2013); IR Sinus/Fist Tube Chk-Non GI (02/25/2022); IR Radiologist Eval & Mgmt (03/10/2022); and IR Radiologist Eval & Mgmt (03/29/2022). Ms. Amsden has a current medication list which includes the following prescription(s): amlodipine, cholecalciferol, duloxetine, fexofenadine, magnesium hydroxide, magnesium oxide, metoprolol, multivitamin with minerals, omeprazole, and levothyroxine, and the following Facility-Administered Medications: iohexol, lactated ringers, midazolam, and pentafluoroprop-tetrafluoroeth. Her primarily concern today is the Neck Pain and Back Pain (R>L)  Initial Vital Signs:  Pulse/HCG Rate: 74ECG Heart Rate: 88 Temp: (!) 97.5 F (36.4 C) Resp: 18 BP: (!) 121/57 SpO2: 96 % (2L of O2)  BMI: Estimated body mass index is 27.1 kg/m as calculated from the following:   Height as of this encounter: '5\' 3"'$  (1.6 m).   Weight as of this encounter: 153 lb (69.4 kg).  Risk Assessment: Allergies: Reviewed. She is allergic to ciprofloxacin, hydrocodone, oxycodone, zoloft [sertraline hcl], amoxicillin, sertraline, sulfa antibiotics, trazodone, tylenol [acetaminophen], ultram [tramadol], and cefuroxime axetil.  Allergy Precautions: None required Coagulopathies: Reviewed. None identified.  Blood-thinner therapy: None at this time Active Infection(s): Reviewed. None identified. Ms. Lurie is afebrile  Site Confirmation: Ms. Durell was asked to confirm the procedure and laterality before marking the site Procedure checklist: Completed Consent: Before the procedure and under the influence of no sedative(s), amnesic(s), or anxiolytics, the patient was informed of the treatment options, risks and possible complications. To fulfill our ethical and legal obligations, as recommended by the American Medical Association's Code of Ethics, I have informed the patient of my clinical impression; the nature and purpose of the treatment or  procedure; the risks, benefits, and possible complications of the intervention; the alternatives, including doing nothing; the risk(s) and benefit(s) of the alternative treatment(s) or procedure(s); and the risk(s) and benefit(s) of doing nothing. The patient was provided information about the general risks and possible complications associated with the procedure. These may include, but are not limited to: failure to achieve desired goals, infection, bleeding, organ or nerve damage, allergic reactions, paralysis, and death. In addition, the patient was informed of those risks and complications associated to Spine-related procedures, such as failure to decrease pain; infection (i.e.: Meningitis, epidural or intraspinal abscess); bleeding (i.e.: epidural hematoma, subarachnoid hemorrhage, or any other type of intraspinal or peri-dural bleeding); organ or nerve damage (i.e.: Any type of peripheral nerve, nerve root, or spinal cord injury) with subsequent damage to sensory, motor, and/or autonomic systems, resulting in permanent pain, numbness, and/or weakness of one or several areas of the body; allergic reactions; (i.e.: anaphylactic reaction); and/or death. Furthermore, the patient was informed of those risks and complications associated with the medications. These include, but are not limited to: allergic reactions (i.e.: anaphylactic or anaphylactoid reaction(s)); adrenal axis suppression; blood sugar elevation that in diabetics may result in ketoacidosis or comma; water retention that in patients with history of congestive heart failure may result in shortness of breath, pulmonary edema, and decompensation with resultant heart failure; weight gain; swelling or edema; medication-induced neural toxicity; particulate matter embolism and blood vessel occlusion with resultant organ, and/or nervous system infarction; and/or aseptic necrosis of one or more joints. Finally, the patient was informed that Medicine is  not an exact science; therefore, there is also the possibility of unforeseen or unpredictable risks and/or possible complications that may result in a catastrophic outcome. The patient indicated having understood very clearly. We have given  the patient no guarantees and we have made no promises. Enough time was given to the patient to ask questions, all of which were answered to the patient's satisfaction. Ms. Rappleye has indicated that she wanted to continue with the procedure. Attestation: I, the ordering provider, attest that I have discussed with the patient the benefits, risks, side-effects, alternatives, likelihood of achieving goals, and potential problems during recovery for the procedure that I have provided informed consent. Date  Time: 06/21/2022 10:06 AM  Pre-Procedure Preparation:  Monitoring: As per clinic protocol. Respiration, ETCO2, SpO2, BP, heart rate and rhythm monitor placed and checked for adequate function Safety Precautions: Patient was assessed for positional comfort and pressure points before starting the procedure. Time-out: I initiated and conducted the "Time-out" before starting the procedure, as per protocol. The patient was asked to participate by confirming the accuracy of the "Time Out" information. Verification of the correct person, site, and procedure were performed and confirmed by me, the nursing staff, and the patient. "Time-out" conducted as per Joint Commission's Universal Protocol (UP.01.01.01). Time: 1038  Description/Narrative of Procedure:          Rationale (medical necessity): procedure needed and proper for the diagnosis and/or treatment of the patient's medical symptoms and needs. Procedural Technique Safety Precautions: Aspiration looking for blood return was conducted prior to all injections. At no point did we inject any substances, as a needle was being advanced. No attempts were made at seeking any paresthesias. Safe injection practices and needle  disposal techniques used. Medications properly checked for expiration dates. SDV (single dose vial) medications used. Description of the Procedure: Protocol guidelines were followed. The patient was assisted into a comfortable position. The target area was identified and the area prepped in the usual manner. Skin & deeper tissues infiltrated with local anesthetic. Appropriate amount of time allowed to pass for local anesthetics to take effect. The procedure needles were then advanced to the target area. Proper needle placement secured. Negative aspiration confirmed. Solution injected in intermittent fashion, asking for systemic symptoms every 0.5cc of injectate. The needles were then removed and the area cleansed, making sure to leave some of the prepping solution back to take advantage of its long term bactericidal properties.  Technical description of procedure:  Description of Procedure #1:  Target Area: The inferior and lateral portion of the pedicle, just lateral to a line created by the 6:00 position of the pedicle and the superior articular process of the vertebral body below. On the lateral view, this target lies just posterior to the anterior aspect of the lamina and posterior to the midpoint created between the anterior and the posterior aspect of the neural foramina.  Safety Precautions: Aspiration looking for blood return was conducted prior to all injections. At no point did we inject any substances, as a needle was being advanced. No attempts were made at seeking any paresthesias. Safe injection practices and needle disposal techniques used. Medications properly checked for expiration dates. SDV (single dose vial) medications used.  Description of the Procedure: Protocol guidelines were followed. The patient was placed in position over the fluoroscopy table. The target area was identified and the area prepped in the usual manner. Skin & deeper tissues infiltrated with local anesthetic.  Appropriate amount of time allowed to pass for local anesthetics to take effect. The procedure needles were then advanced to the target area. Proper needle placement secured. Negative aspiration confirmed. Solution injected in intermittent fashion, asking for systemic symptoms every 0.2cc of injectate. The needles were  then removed and the area cleansed, making sure to leave some of the prepping solution back to take advantage of its long term bactericidal properties.  Start Time: 1038 hrs.  Description of Procedure #2:  Target Area: The  interlaminar space, initially targeting the lower border of the superior vertebral body lamina.  Description of the Procedure: Protocol guidelines were followed. The patient was placed in position over the fluoroscopy table. The target area was identified and the area prepped in the usual manner. Skin & deeper tissues infiltrated with local anesthetic. Appropriate amount of time allowed to pass for local anesthetics to take effect. The procedure needle was introduced through the skin, ipsilateral to the reported pain, and advanced to the target area. Bone was contacted and the needle walked caudad, until the lamina was cleared. The ligamentum flavum was engaged and loss-of-resistance technique used as the epidural needle was advanced. The epidural space was identified using "loss-of-resistance technique" with 2-3 ml of PF-NaCl (0.9% NSS), in a 5cc LOR glass syringe. Proper needle placement secured. Negative aspiration confirmed. Solution injected in intermittent fashion, asking for systemic symptoms every 0.5cc of injectate. The needles were then removed and the area cleansed, making sure to leave some of the prepping solution back to take advantage of its long term bactericidal properties.  Vitals:   06/21/22 1035 06/21/22 1040 06/21/22 1045 06/21/22 1049  BP: (!) 158/77 (!) 157/74 (!) 147/74 (!) 149/71  Pulse:      Resp: '16 18 20 19  '$ Temp:      TempSrc:      SpO2:  100% 100% 100% 100%  Weight:      Height:         End Time: 1049 hrs.  Imaging Guidance (Spinal):          Type of Imaging Technique: Fluoroscopy Guidance (Spinal) Indication(s): Assistance in needle guidance and placement for procedures requiring needle placement in or near specific anatomical locations not easily accessible without such assistance. Exposure Time: Please see nurses notes. Contrast: Before injecting any contrast, we confirmed that the patient did not have an allergy to iodine, shellfish, or radiological contrast. Once satisfactory needle placement was completed at the desired level, radiological contrast was injected. Contrast injected under live fluoroscopy. No contrast complications. See chart for type and volume of contrast used. Fluoroscopic Guidance: I was personally present during the use of fluoroscopy. "Tunnel Vision Technique" used to obtain the best possible view of the target area. Parallax error corrected before commencing the procedure. "Direction-depth-direction" technique used to introduce the needle under continuous pulsed fluoroscopy. Once target was reached, antero-posterior, oblique, and lateral fluoroscopic projection used confirm needle placement in all planes. Images permanently stored in EMR. Interpretation: I personally interpreted the imaging intraoperatively. Adequate needle placement confirmed in multiple planes. Appropriate spread of contrast into desired area was observed. No evidence of afferent or efferent intravascular uptake. No intrathecal or subarachnoid spread observed. Permanent images saved into the patient's record.  Post-operative Assessment:  Post-procedure Vital Signs:  Pulse/HCG Rate: 7478 Temp: (!) 97.5 F (36.4 C) Resp: 19 BP: (!) 149/71 SpO2: 100 %  EBL: None  Complications: No immediate post-treatment complications observed by team, or reported by patient.  Note: The patient tolerated the entire procedure well. A repeat set  of vitals were taken after the procedure and the patient was kept under observation following institutional policy, for this type of procedure. Post-procedural neurological assessment was performed, showing return to baseline, prior to discharge. The patient was provided with post-procedure  discharge instructions, including a section on how to identify potential problems. Should any problems arise concerning this procedure, the patient was given instructions to immediately contact us, at any time, without hesitation. In any case, we plan to contact the patient by telephone for a follow-up status report regarding this interventional procedure.  Comments:  No additional relevant information.  Plan of Care  Orders:  Orders Placed This Encounter  Procedures   Lumbar Epidural Injection    Scheduling Instructions:     Procedure: Interlaminar LESI L5-S1     Laterality: Right     Sedation: Patient's choice     Timeframe: Today    Order Specific Question:   Where will this procedure be performed?    Answer:   [facility]   Lumbar Transforaminal Epidural    Scheduling Instructions:     Laterality: Right-sided     Level(s): L4             Sedation: Patient's choice.     Timeframe: Today    Order Specific Question:   Where will this procedure be performed?    Answer:   [facility]   DG PAIN [facility] C-ARM 1-60 MIN NO REPORT    Intraoperative interpretation by procedural physician at [place].    Standing Status:   Standing    Number of Occurrences:   1    Order Specific Question:   Reason for exam:    Answer:   Assistance in needle guidance and placement for procedures requiring needle placement in or near specific anatomical locations not easily accessible without such assistance.   Informed Consent Details: Physician/Practitioner Attestation; Transcribe to consent form and obtain patient signature    Note: Always confirm laterality of pain with Ms. Schoenfelder, before  procedure. Transcribe to consent form and obtain patient signature.    Order Specific Question:   Physician/Practitioner attestation of informed consent for procedure/surgical case    Answer:   I, the physician/practitioner, attest that I have discussed with the patient the benefits, risks, side effects, alternatives, likelihood of achieving goals and potential problems during recovery for the procedure that I have provided informed consent.    Order Specific Question:   Procedure    Answer:   Lumbar epidural steroid injection under fluoroscopic guidance    Order Specific Question:   Physician/Practitioner performing the procedure    Answer:   Annarae Macnair A. Dossie Arbour, MD    Order Specific Question:   Indication/Reason    Answer:   Low back and/or lower extremity pain secondary to lumbar radiculitis   Provide equipment / supplies at bedside    "Epidural Tray" (Disposable  single use) Catheter: NOT required    Standing Status:   Standing    Number of Occurrences:   1    Order Specific Question:   Specify    Answer:   Epidural Tray   Informed Consent Details: Physician/Practitioner Attestation; Transcribe to consent form and obtain patient signature    Provider Attestation: I, [place]. Dossie Arbour, MD, (Pain Management Specialist), the physician/practitioner, attest that I have discussed with the patient the benefits, risks, side effects, alternatives, likelihood of achieving goals and potential problems during recovery for the procedure that I have provided informed consent.    Scheduling Instructions:     Note: Always confirm laterality of pain with Ms. Pikus, before procedure.     Transcribe to consent form and obtain patient signature.    Order Specific Question:   Physician/Practitioner attestation of  informed consent for procedure/surgical case    Answer:   I, the physician/practitioner, attest that I have discussed with the patient the benefits, risks, side effects, alternatives, likelihood  of achieving goals and potential problems during recovery for the procedure that I have provided informed consent.    Order Specific Question:   Procedure    Answer:   Diagnostic lumbar transforaminal epidural steroid injection under fluoroscopic guidance. (See notes for level and laterality.)    Order Specific Question:   Physician/Practitioner performing the procedure    Answer:   Emalia Witkop A. Dossie Arbour, MD    Order Specific Question:   Indication/Reason    Answer:   Lumbar radiculopathy/radiculitis associated with lumbar stenosis   Provide equipment / supplies at bedside    "Block Tray" (Disposable  single use) Needle type: SpinalSpinal Amount/quantity: 1 Size: Medium (5-inch) Gauge: 22G    Standing Status:   Standing    Number of Occurrences:   1    Order Specific Question:   Specify    Answer:   Block Tray   Chronic Opioid Analgesic:  No opioid analgesics prescribed by our practice.   Medications ordered for procedure: Meds ordered this encounter  Medications   iohexol (OMNIPAQUE) 180 MG/ML injection 10 mL    Must be Myelogram-compatible. If not available, you may substitute with a water-soluble, non-ionic, hypoallergenic, myelogram-compatible radiological contrast medium.   lidocaine (XYLOCAINE) 2 % (with pres) injection 400 mg   pentafluoroprop-tetrafluoroeth (GEBAUERS) aerosol   lactated ringers infusion   midazolam (VERSED) 5 MG/5ML injection 0.5-2 mg    Make sure Flumazenil is available in the pyxis when using this medication. If oversedation occurs, administer 0.2 mg IV over 15 sec. If after 45 sec no response, administer 0.2 mg again over 1 min; may repeat at 1 min intervals; not to exceed 4 doses (1 mg)   sodium chloride flush (NS) 0.9 % injection 2 mL   ropivacaine (PF) 2 mg/mL (0.2%) (NAROPIN) injection 2 mL   triamcinolone acetonide (KENALOG-40) injection 40 mg   sodium chloride flush (NS) 0.9 % injection 1 mL   ropivacaine (PF) 2 mg/mL (0.2%) (NAROPIN) injection 1 mL    dexamethasone (DECADRON) injection 10 mg   Medications administered: We administered lidocaine, sodium chloride flush, ropivacaine (PF) 2 mg/mL (0.2%), triamcinolone acetonide, sodium chloride flush, ropivacaine (PF) 2 mg/mL (0.2%), and dexamethasone.  See the medical record for exact dosing, route, and time of administration.  Follow-up plan:   Return in about 2 weeks (around 07/05/2022) for Proc-day (T,Th), (F2F), (PPE).       Interventional Therapies  Risk  Complexity Considerations:   Estimated body mass index is 29.45 kg/m as calculated from the following:   Height as of 02/16/21: '5\' 2"'$  (1.575 m).   Weight as of 02/16/21: 161 lb (73 kg). Patient is initially referred as a  "Fast-Track".  Poor compliance with follow-up evaluations after procedures.  (02/27/2018; 10/30/2018)    Planned  Pending:   Diagnostic/Therapeutic right L4 TFESI #1 + right L5-S1 LESI #1    Under consideration:   Therapeutic bilateral lumbar facet RFA #1  Diagnostic right L3-4 LESI #3  Diagnostic bilateral L3, L4 and/or L5 TESI  Diagnostic bilateral L2-3 transforaminal ESI  Diagnostic bilateral lumbar facet MBB #3  Diagnostic cervical facet MBB  Diagnostic midline Cervical ESI    Completed:   Diagnostic bilateral lumbar facet MBB x3 (02/16/2021) (100/100/100/25)  Therapeutic midline-right L2-3 LESI x2 (07/17/2018) (100/100/100/75-100) (100/100/90/50)  Therapeutic right L3-4 LESI x2 (02/27/2018) (80/80/80/>50)  Therapeutic  Palliative (PRN) options:   Palliative right L3-4 LESI #3       Recent Visits Date Type Provider Dept  05/30/22 Office Visit Milinda Pointer, MD [facility]  Showing recent visits within past 90 days and meeting all other requirements Today's Visits Date Type Provider Dept  06/21/22 Procedure visit Milinda Pointer, MD [facility]  Showing today's visits and meeting all other requirements Future Appointments Date Type Provider Dept  07/05/22 Appointment  Milinda Pointer, MD [facility]  Showing future appointments within next 90 days and meeting all other requirements  Disposition: Discharge home  Discharge (Date  Time): 06/21/2022;   hrs.   Primary Care Physician: Marinda Elk, MD Location: [facility] Note by: Gaspar Cola, MD Date: 06/21/2022; Time: 11:09 AM  Disclaimer:  Medicine is not an Chief Strategy Officer. The only guarantee in medicine is that nothing is guaranteed. It is important to note that the decision to proceed with this intervention was based on the information collected from the patient. The Data and conclusions were drawn from the patient's questionnaire, the interview, and the physical examination. Because the information was provided in large part by the patient, it cannot be guaranteed that it has not been purposely or unconsciously manipulated. Every effort has been made to obtain as much relevant data as possible for this evaluation. It is important to note that the conclusions that lead to this procedure are derived in large part from the available data. Always take into account that the treatment will also be dependent on availability of resources and existing treatment guidelines, considered by other Pain Management Practitioners as being common knowledge and practice, at the time of the intervention. For Medico-Legal purposes, it is also important to point out that variation in procedural techniques and pharmacological choices are the acceptable norm. The indications, contraindications, technique, and results of the above procedure should only be interpreted and judged by a Board-Certified Interventional Pain Specialist with extensive familiarity and expertise in the same exact procedure and technique.

## 2022-06-22 ENCOUNTER — Telehealth: Payer: Self-pay

## 2022-06-22 NOTE — Telephone Encounter (Signed)
Post procedure phone call. LM with daughter.

## 2022-07-04 NOTE — Progress Notes (Signed)
Patient rescheduled on day of appointment.

## 2022-07-05 ENCOUNTER — Ambulatory Visit (HOSPITAL_BASED_OUTPATIENT_CLINIC_OR_DEPARTMENT_OTHER): Payer: Medicare Other | Admitting: Pain Medicine

## 2022-07-05 DIAGNOSIS — Z91199 Patient's noncompliance with other medical treatment and regimen due to unspecified reason: Secondary | ICD-10-CM

## 2022-07-07 ENCOUNTER — Other Ambulatory Visit: Payer: Self-pay | Admitting: Student

## 2022-07-07 DIAGNOSIS — Z91199 Patient's noncompliance with other medical treatment and regimen due to unspecified reason: Secondary | ICD-10-CM

## 2022-07-07 NOTE — Progress Notes (Signed)
Palliative NP attempted visit. No answer at door. Attempted to reach patient, daughter and son with numbers on file. Unable to leave message. Will attempt to have visit rescheduled.

## 2022-08-30 ENCOUNTER — Encounter: Payer: Self-pay | Admitting: Pain Medicine

## 2022-08-30 ENCOUNTER — Ambulatory Visit: Payer: Medicare Other | Attending: Pain Medicine | Admitting: Pain Medicine

## 2022-08-30 DIAGNOSIS — M5442 Lumbago with sciatica, left side: Secondary | ICD-10-CM

## 2022-08-30 DIAGNOSIS — M79604 Pain in right leg: Secondary | ICD-10-CM | POA: Diagnosis not present

## 2022-08-30 DIAGNOSIS — M5410 Radiculopathy, site unspecified: Secondary | ICD-10-CM | POA: Diagnosis not present

## 2022-08-30 DIAGNOSIS — R937 Abnormal findings on diagnostic imaging of other parts of musculoskeletal system: Secondary | ICD-10-CM

## 2022-08-30 DIAGNOSIS — M5417 Radiculopathy, lumbosacral region: Secondary | ICD-10-CM

## 2022-08-30 DIAGNOSIS — G8929 Other chronic pain: Secondary | ICD-10-CM

## 2022-08-30 DIAGNOSIS — M5441 Lumbago with sciatica, right side: Secondary | ICD-10-CM

## 2022-08-30 DIAGNOSIS — M79605 Pain in left leg: Secondary | ICD-10-CM

## 2022-08-30 NOTE — Patient Instructions (Signed)
______________________________________________________________________  Preparing for your procedure  During your procedure appointment there will be: No Prescription Refills. No disability issues to discussed. No medication changes or discussions.  Instructions: Food intake: Avoid eating anything solid for at least 8 hours prior to your procedure. Clear liquid intake: You may take clear liquids such as water up to 2 hours prior to your procedure. (No carbonated drinks. No soda.) Transportation: Unless otherwise stated by your physician, bring a driver. Morning Medicines: Except for blood thinners, take all of your other morning medications with a sip of water. Make sure to take your heart and blood pressure medicines. If your blood pressure's lower number is above 100, the case will be rescheduled. Blood thinners: If you take a blood thinner, but were not instructed to stop it, call our office [phone] and ask to talk to a nurse. Not stopping a blood thinner prior to certain procedures could lead to serious complications. Diabetics on insulin: Notify the staff so that you can be scheduled 1st case in the morning. If your diabetes requires high dose insulin, take only  of your normal insulin dose the morning of the procedure and notify the staff that you have done so. Preventing infections: Shower with an antibacterial soap the morning of your procedure.  Build-up your immune system: Take 1000 mg of Vitamin C with every meal (3 times a day) the day prior to your procedure. Antibiotics: Inform the nursing staff if you are taking any antibiotics or if you have any conditions that may require antibiotics prior to procedures. (Example: recent joint implants)   Pregnancy: If you are pregnant make sure to notify the nursing staff. Not doing so may result in injury to the fetus, including death.  Sickness: If you have a cold, fever, or any active infections, call and cancel or reschedule your  procedure. Receiving steroids while having an infection may result in complications. Arrival: You must be in the facility at least 30 minutes prior to your scheduled procedure. Tardiness: Your scheduled time is also the cutoff time. If you do not arrive at least 15 minutes prior to your procedure, you will be rescheduled.  Children: Do not bring any children with you. Make arrangements to keep them home. Dress appropriately: There is always a possibility that your clothing may get soiled. Avoid long dresses. Valuables: Do not bring any jewelry or valuables.  Reasons to call and reschedule or cancel your procedure: (Following these recommendations will minimize the risk of a serious complication.) Surgeries: Avoid having procedures within 2 weeks of any surgery. (Avoid for 2 weeks before or after any surgery). Flu Shots: Avoid having procedures within 2 weeks of a flu shots or . (Avoid for 2 weeks before or after immunizations). Barium: Avoid having a procedure within 7-10 days after having had a radiological study involving the use of radiological contrast. (Myelograms, Barium swallow or enema study). Heart attacks: Avoid any elective procedures or surgeries for the initial 6 months after a "Myocardial Infarction" (Heart Attack). Blood thinners: It is imperative that you stop these medications before procedures. Let us know if you if you take any blood thinner.  Infection: Avoid procedures during or within two weeks of an infection (including chest colds or gastrointestinal problems). Symptoms associated with infections include: Localized redness, fever, chills, night sweats or profuse sweating, burning sensation when voiding, cough, congestion, stuffiness, runny nose, sore throat, diarrhea, nausea, vomiting, cold or Flu symptoms, recent or current infections. It is specially important if the infection is   over the area that we intend to treat. Heart and lung problems: Symptoms that may suggest an  active cardiopulmonary problem include: cough, chest pain, breathing difficulties or shortness of breath, dizziness, ankle swelling, uncontrolled high or unusually low blood pressure, and/or palpitations. If you are experiencing any of these symptoms, cancel your procedure and contact your primary care physician for an evaluation.  Remember:  Regular Business hours are:  Monday to Thursday 8:00 AM to 4:00 PM  Provider's Schedule: Neala Miggins, MD:  Procedure days: Tuesday and Thursday 7:30 AM to 4:00 PM  Bilal Lateef, MD:  Procedure days: Monday and Wednesday 7:30 AM to 4:00 PM  ______________________________________________________________________    ____________________________________________________________________________________________  General Risks and Possible Complications  Patient Responsibilities: It is important that you read this as it is part of your informed consent. It is our duty to inform you of the risks and possible complications associated with treatments offered to you. It is your responsibility as a patient to read this and to ask questions about anything that is not clear or that you believe was not covered in this document.  Patient's Rights: You have the right to refuse treatment. You also have the right to change your mind, even after initially having agreed to have the treatment done. However, under this last option, if you wait until the last second to change your mind, you may be charged for the materials used up to that point.  Introduction: Medicine is not an exact science. Everything in Medicine, including the lack of treatment(s), carries the potential for danger, harm, or loss (which is by definition: Risk). In Medicine, a complication is a secondary problem, condition, or disease that can aggravate an already existing one. All treatments carry the risk of possible complications. The fact that a side effects or complications occurs, does not imply  that the treatment was conducted incorrectly. It must be clearly understood that these can happen even when everything is done following the highest safety standards.  No treatment: You can choose not to proceed with the proposed treatment alternative. The "PRO(s)" would include: avoiding the risk of complications associated with the therapy. The "CON(s)" would include: not getting any of the treatment benefits. These benefits fall under one of three categories: diagnostic; therapeutic; and/or palliative. Diagnostic benefits include: getting information which can ultimately lead to improvement of the disease or symptom(s). Therapeutic benefits are those associated with the successful treatment of the disease. Finally, palliative benefits are those related to the decrease of the primary symptoms, without necessarily curing the condition (example: decreasing the pain from a flare-up of a chronic condition, such as incurable terminal cancer).  General Risks and Complications: These are associated to most interventional treatments. They can occur alone, or in combination. They fall under one of the following six (6) categories: no benefit or worsening of symptoms; bleeding; infection; nerve damage; allergic reactions; and/or death. No benefits or worsening of symptoms: In Medicine there are no guarantees, only probabilities. No healthcare provider can ever guarantee that a medical treatment will work, they can only state the probability that it may. Furthermore, there is always the possibility that the condition may worsen, either directly, or indirectly, as a consequence of the treatment. Bleeding: This is more common if the patient is taking a blood thinner, either prescription or over the counter (example: Goody Powders, Fish oil, Aspirin, Garlic, etc.), or if suffering a condition associated with impaired coagulation (example: Hemophilia, cirrhosis of the liver, low platelet counts, etc.). However, even if   you  do not have one on these, it can still happen. If you have any of these conditions, or take one of these drugs, make sure to notify your treating physician. Infection: This is more common in patients with a compromised immune system, either due to disease (example: diabetes, cancer, human immunodeficiency virus [HIV], etc.), or due to medications or treatments (example: therapies used to treat cancer and rheumatological diseases). However, even if you do not have one on these, it can still happen. If you have any of these conditions, or take one of these drugs, make sure to notify your treating physician. Nerve Damage: This is more common when the treatment is an invasive one, but it can also happen with the use of medications, such as those used in the treatment of cancer. The damage can occur to small secondary nerves, or to large primary ones, such as those in the spinal cord and brain. This damage may be temporary or permanent and it may lead to impairments that can range from temporary numbness to permanent paralysis and/or brain death. Allergic Reactions: Any time a substance or material comes in contact with our body, there is the possibility of an allergic reaction. These can range from a mild skin rash (contact dermatitis) to a severe systemic reaction (anaphylactic reaction), which can result in death. Death: In general, any medical intervention can result in death, most of the time due to an unforeseen complication. ____________________________________________________________________________________________    

## 2022-08-30 NOTE — Progress Notes (Signed)
Patient: Laura Frey  Service Category: E/M  Provider: Gaspar Cola, MD  DOB: 1932/04/02  DOS: 08/30/2022  Location: Office  MRN: [id number]  Setting: Ambulatory outpatient  Referring Provider: Marinda Elk, MD  Type: Established Patient  Specialty: Interventional Pain Management  PCP: Marinda Elk, MD  Location: Remote location  Delivery: TeleHealth     Virtual Encounter - Pain Management PROVIDER NOTE: Information contained herein reflects review and annotations entered in association with encounter. Interpretation of such information and data should be left to medically-trained personnel. Information provided to patient can be located elsewhere in the medical record under "Patient Instructions". Document created using STT-dictation technology, any transcriptional errors that may result from process are unintentional.    Contact & Pharmacy Preferred: [phone] Home: [phone] (home) Mobile: [phone] (mobile) E-mail: No e-mail address on record  [facility] ([place]) [place], [place] [address] Phone: [phone] Fax: [place], [place] - [place] [address] Phone: [phone] Fax: [phone]   Pre-screening  Laura Frey offered "in-person" vs "virtual" encounter. She indicated preferring virtual for this encounter.   Reason COVID-19*  Social distancing based on CDC and AMA recommendations.   I contacted Laura Frey on 08/30/2022 via telephone.      I clearly identified myself as Gaspar Cola, MD. I verified that I was speaking with the correct person using two identifiers (Name: Laura Frey, and date of birth: 1932-05-08).  Consent I sought verbal advanced consent from Laura Frey for virtual visit interactions. I informed Laura Frey of possible security and privacy concerns, risks, and  limitations associated with providing "not-in-person" medical evaluation and management services. I also informed Laura Frey of the availability of "in-person" appointments. Finally, I informed her that there would be a charge for the virtual visit and that she could be  personally, fully or partially, financially responsible for it. Laura Frey expressed understanding and agreed to proceed.   Historic Elements   Laura Frey is a 88 y.o. year old, female patient evaluated today after our last contact on 07/05/2022. Laura Frey  has a past medical history of Anemia, Asthma, Cancer ([place]), Chronic back pain, Depression, GERD (gastroesophageal reflux disease), Headache(784.0), Hypertension, Hypothyroidism, Peripheral vascular disease ([place]), Sepsis ([place]) (01/27/2014), Shortness of breath, Tendinitis of both rotator cuffs, and UTI (urinary tract infection). She also  has a past surgical history that includes Tubal ligation; Appendectomy; Tonsillectomy; Eye surgery (Bilateral); Abdominal hysterectomy; Bladder repair; Anterior cervical decomp/discectomy fusion (N/A, 05/01/2013); IR Sinus/Fist Tube Chk-Non GI (02/25/2022); IR Radiologist Eval & Mgmt (03/10/2022); and IR Radiologist Eval & Mgmt (03/29/2022). Laura Frey has a current medication list which includes the following prescription(s): amlodipine, cholecalciferol, duloxetine, fexofenadine, magnesium hydroxide, magnesium oxide, metoprolol, multivitamin with minerals, omeprazole, and levothyroxine. She  reports that she quit smoking about 27 years ago. Her smoking use included cigarettes. She has a 30.00 pack-year smoking history. She has never used smokeless tobacco. She reports that she does not drink alcohol and does not use drugs. Laura Frey is allergic to ciprofloxacin, hydrocodone, oxycodone, zoloft [sertraline hcl], amoxicillin, sertraline, sulfa antibiotics, trazodone, tylenol [acetaminophen], ultram [tramadol], and cefuroxime axetil.   HPI  Today, she is  being contacted for a post-procedure assessment.  According to the patient she attained 100% relief of the pain for the duration of the local anesthetic followed by  an 80% improvement that lasted for approximately 2 weeks until she fell.  Currently she is experiencing lower extremity pain that seems to be worse than the low back pain.  In the case of the lower extremity the pain is on the right leg and hip going all the way down to the top of her foot and the big toe and what appears to be an L5 dermatomal distribution.  In terms of the low back pain, this seems to be primarily on the right side.  She was recently diagnosed with a UTI and therefore we will not schedule her to return immediately for a second injection since she is currently on antibiotics.  However, the patient was instructed to give [place] a call as soon as she finishes the antibiotics and is given a greenlight from her PCP.  The plan will be to do the second injection.  Post-procedure evaluation   Procedure 1: Type: Lumbar Trans-Foraminal Epidural Steroid Injection (TFESI) #1  Laterality: Right (-RT) Paravertebral Level: L4   Target: Foraminal peri-neural region, subarticular lateral recess area, and anterior intraspinal epidural space.  Procedure 2: Type: Lumbar Epidural Steroid Injection (LESI) (Inter-laminar) #1  Laterality: Right (-RT) Paravertebral Level: L5-S1   Target: Posterior intraspinal epidural space Imaging: Fluoroscopic guidance Anesthesia: Local anesthesia (1-2% Lidocaine) Anxiolysis: None                 Sedation: No Sedation              .  DOS: 06/21/2022  Performed by: Gaspar Cola, MD  Purpose: Diagnostic/Therapeutic Indications: Low back/lower extremity pain severe enough to impact quality of life or function. Rationale (medical necessity): procedure needed and proper for the diagnosis and/or treatment of Laura Frey's medical symptoms and needs. 1. DDD (degenerative disc disease), lumbar   2. 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 Lumbar lateral recess stenosis (Bilateral: L3-4, L5-S1)   4. Lumbar Grade 1 Anterolisthesis of L3/L4   5. Lumbosacral foraminal stenosis (Bilateral: L2-3, L4-5, L5-S1) (L>R)   6. Lumbosacral Grade 1 Retrolisthesis of L5/S1   7. Lumbosacral radiculopathy at L5 (Right)   8. Chronic lower extremity radicular pain (Bilateral) (R>L)   9. Chronic lower extremity pain (2ry area of Pain) (Bilateral) (R>L)   10. Chronic low back pain (1ry area of Pain) (Bilateral) (R>L) w/ sciatica (Bilateral)   11. Closed compression fracture of L3 lumbar vertebra, sequela   12. Oxygen dependent   13. Spinal stenosis, lumbar region, with neurogenic claudication    NAS-11 Pain score:   Pre-procedure: 5 /10   Post-procedure: 5 /10     Effectiveness:  Initial hour after procedure: 100 %. Subsequent 4-6 hours post-procedure: 100 %. Analgesia past initial 6 hours: 80 % (2 weeks). Ongoing improvement:  Analgesic: The patient refers having attained an 80% improvement that lasted for 2 weeks until she fell.  Unfortunately her pain has now returned. Function: Somewhat improved ROM: Somewhat improved  Pharmacotherapy Assessment   Opioid Analgesic: No opioid analgesics prescribed by our practice.   Monitoring: [place] PMP: PDMP reviewed during this encounter.       Pharmacotherapy: No side-effects or adverse reactions reported. Compliance: No problems identified. Effectiveness: Clinically acceptable. Plan: Refer to "POC". UDS: No results found for: "SUMMARY" No results found for: "CBDTHCR", "D8THCCBX", "D9THCCBX"   Laboratory Chemistry Profile   Renal Lab Results  Component Value Date   BUN 10 02/24/2022   CREATININE 0.62 02/24/2022   BCR 19 01/01/2018  GFRAA 62 01/01/2018   GFRNONAA >60 02/24/2022    Hepatic Lab Results  Component Value Date   AST 20 02/14/2022   ALT 16 02/14/2022   ALBUMIN 3.5 02/14/2022   ALKPHOS 66 02/14/2022     Electrolytes Lab Results  Component Value Date   NA 137 02/24/2022   K 4.5 02/24/2022   CL 98 02/24/2022   CALCIUM 8.8 (L) 02/24/2022   MG 1.8 02/16/2022    Bone Lab Results  Component Value Date   25OHVITD1 29 (L) 01/01/2018   25OHVITD2 <1.0 01/01/2018   25OHVITD3 28 01/01/2018    Inflammation (CRP: Acute Phase) (ESR: Chronic Phase) Lab Results  Component Value Date   CRP 1.0 01/01/2018   ESRSEDRATE 12 01/01/2018   LATICACIDVEN 0.7 02/15/2022         Note: Above Lab results reviewed.  Imaging  DG PAIN CLINIC C-ARM 1-60 MIN NO REPORT Fluoro was used, but no Radiologist interpretation will be provided.  Please refer to "NOTES" tab for provider progress note.  Assessment  The primary encounter diagnosis was Chronic lower extremity radicular pain (Bilateral) (R>L). Diagnoses of Chronic lower extremity pain (2ry area of Pain) (Bilateral) (R>L), Chronic low back pain (1ry area of Pain) (Bilateral) (R>L) w/ sciatica (Bilateral), Lumbosacral radiculopathy at L5 (Right), and Abnormal MRI, lumbar spine (04/07/2022) were also pertinent to this visit.  Plan of Care  Problem-specific:  No problem-specific Assessment & Plan notes found for this encounter.  Laura Frey has a current medication list which includes the following long-term medication(s): amlodipine, duloxetine, fexofenadine, metoprolol, omeprazole, and levothyroxine.  Pharmacotherapy (Medications Ordered): No orders of the defined types were placed in this encounter.  Orders:  Orders Placed This Encounter  Procedures   Lumbar Epidural Injection    Standing Status:   Standing    Number of Occurrences:   1    Standing Expiration Date:   11/30/2022    Scheduling Instructions:     Procedure: Interlaminar Lumbar Epidural Steroid injection (LESI)  L5-S1     Laterality: Right-sided     Sedation: Patient's choice.     Timeframe: PRN    Order Specific Question:   Where will this procedure be performed?     Answer:   [facility]   Lumbar Transforaminal Epidural    Standing Status:   Standing    Number of Occurrences:   1    Standing Expiration Date:   11/30/2022    Scheduling Instructions:     Laterality: Right-sided     Level(s): L4             Sedation: Patient's choice.     Timeframe: PRN    Order Specific Question:   Where will this procedure be performed?    Answer:   [facility]   Follow-up plan:   Return if symptoms worsen or fail to improve, for procedure (Clinic): (R) L4 TFESI #2 + (R) L5-S1 LESI #2 .     Interventional Therapies  Risk  Complexity Considerations:   Estimated body mass index is 29.45 kg/m as calculated from the following:   Height as of 02/16/21: _0  (1.575 m).   Weight as of 02/16/21: 161 lb (73 kg). Patient is initially referred as a  "Fast-Track".  Poor compliance with follow-up evaluations after procedures.  (02/27/2018; 10/30/2018)    Planned  Pending:   Diagnostic/Therapeutic right L4 TFESI #2 + right L5-S1 LESI #2    Under consideration:   Therapeutic bilateral lumbar  facet RFA #1  Diagnostic right L3-4 LESI #3  Diagnostic bilateral L3, L4 and/or L5 TESI  Diagnostic bilateral L2-3 transforaminal ESI  Diagnostic bilateral lumbar facet MBB #3  Diagnostic cervical facet MBB  Diagnostic midline Cervical ESI    Completed:   Diagnostic/Therapeutic right L4 TFESI x1 + right L5-S1 LESI x1 (100/100/80 x2 weeks/<50) (fell). Diagnostic bilateral lumbar facet MBB x3 (02/16/2021) (100/100/100/25)  Therapeutic midline-right L2-3 LESI x2 (07/17/2018) (100/100/100/75-100) (100/100/90/50)  Therapeutic right L3-4 LESI x2 (02/27/2018) (80/80/80/>50)    Therapeutic  Palliative (PRN) options:   Palliative right L3-4 LESI #3     Recent Visits Date Type Provider Dept  06/21/22 Procedure visit Milinda Pointer, MD [facility]  Showing recent visits within past 90 days and meeting all other requirements Today's Visits Date Type Provider  Dept  08/30/22 Office Visit Milinda Pointer, MD [facility]  Showing today's visits and meeting all other requirements Future Appointments No visits were found meeting these conditions. Showing future appointments within next 90 days and meeting all other requirements  I discussed the assessment and treatment plan with the patient. The patient was provided an opportunity to ask questions and all were answered. The patient agreed with the plan and demonstrated an understanding of the instructions.  Patient advised to call back or seek an in-person evaluation if the symptoms or condition worsens.  Duration of encounter: 15 minutes.  Note by: Gaspar Cola, MD Date: 08/30/2022; Time: 12:38 PM

## 2022-09-05 ENCOUNTER — Ambulatory Visit: Payer: Medicare Other | Admitting: Pain Medicine

## 2022-10-03 ENCOUNTER — Ambulatory Visit: Payer: Medicare Other | Admitting: Dermatology

## 2023-11-15 IMAGING — CT CT ABD-PELV W/ CM
2 of 5 series · 15 of 46 positions shown, 17 images · IV contrast (APPLIED)
Comparison: Chest x-ray from earlier in the same day.

CLINICAL DATA: Free air under the right hemidiaphragm on recent
chest x-ray

EXAM:
CT ABDOMEN AND PELVIS WITH CONTRAST
TECHNIQUE: Multidetector CT imaging of the abdomen and pelvis was performed
using the standard protocol following bolus administration of
intravenous contrast.

[Series 2: abdomen 5.0 · axial · 0.82mm/px · z∈[-1075,-695]mm · 12 of 90 slices shown, 14 images]
[im 7/90  soft-tissue]
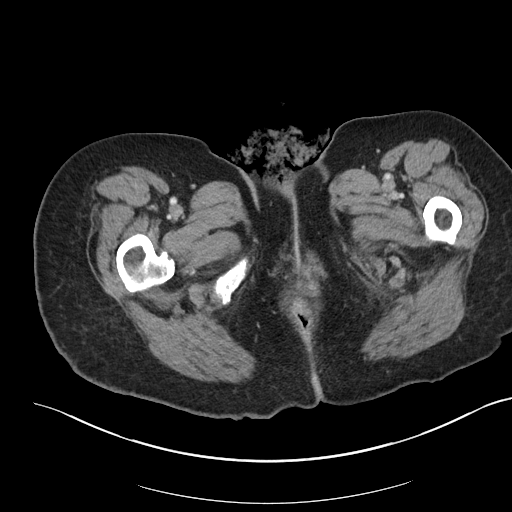
[im 7/90  bone]
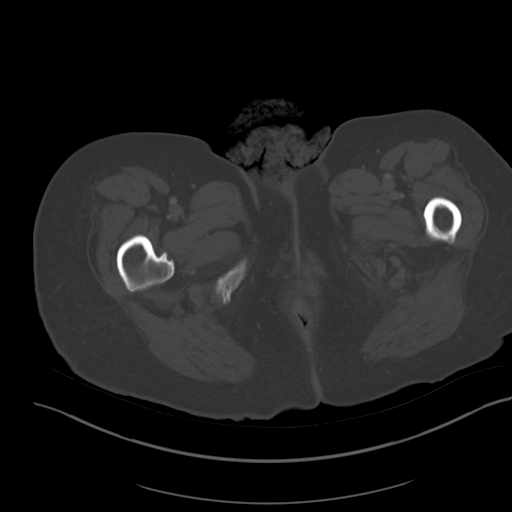
[im 13/90  soft-tissue]
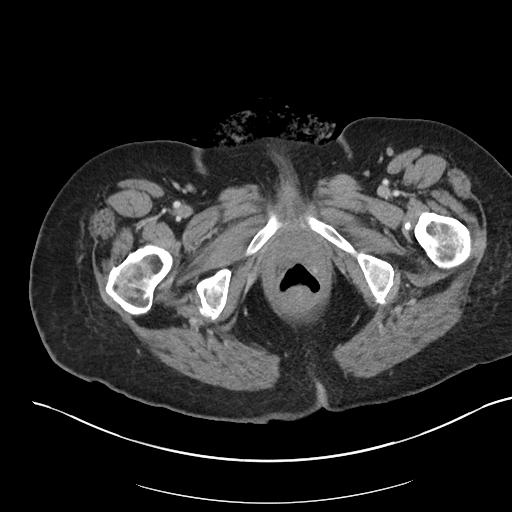
[im 20/90  soft-tissue]
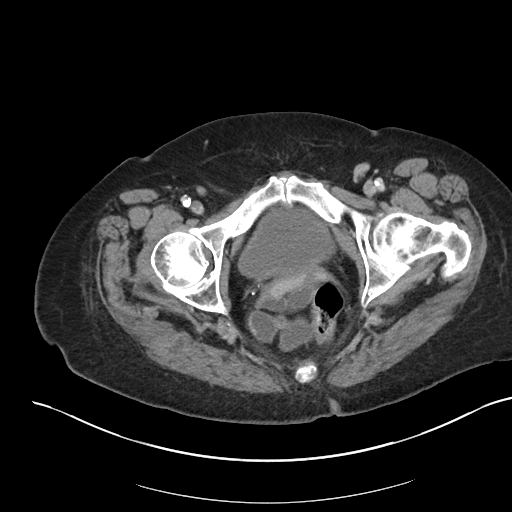
[im 26/90  soft-tissue]
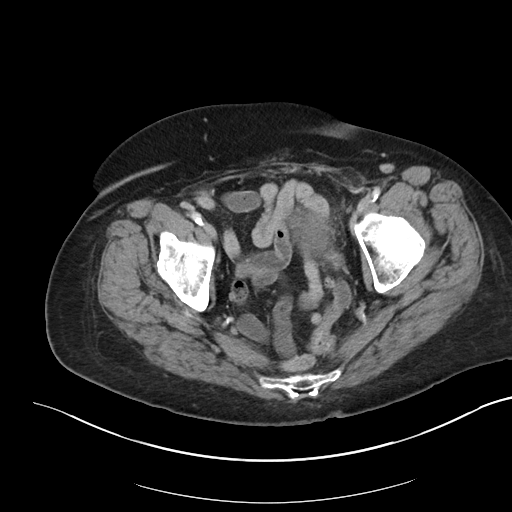
[im 32/90  soft-tissue]
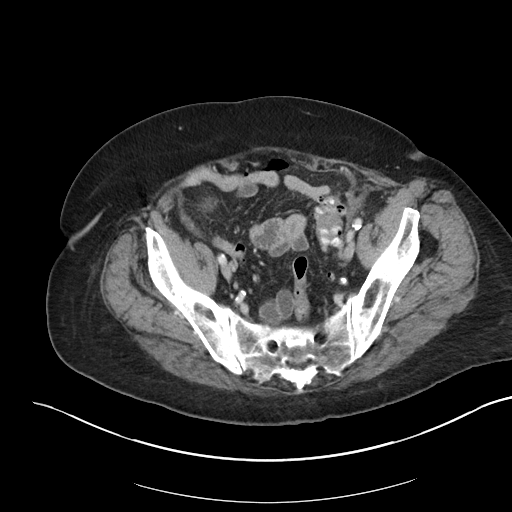
[im 39/90  soft-tissue]
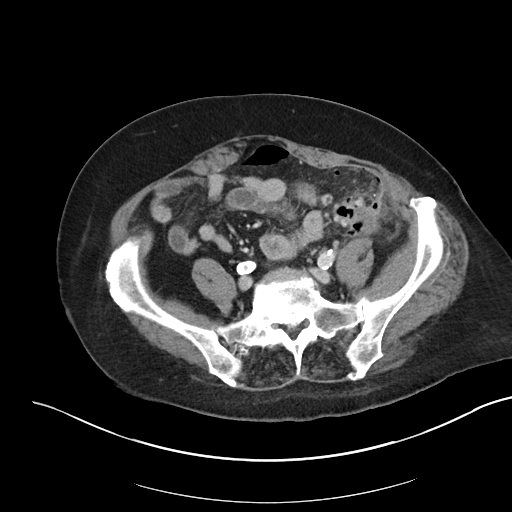
[im 51/90  soft-tissue]
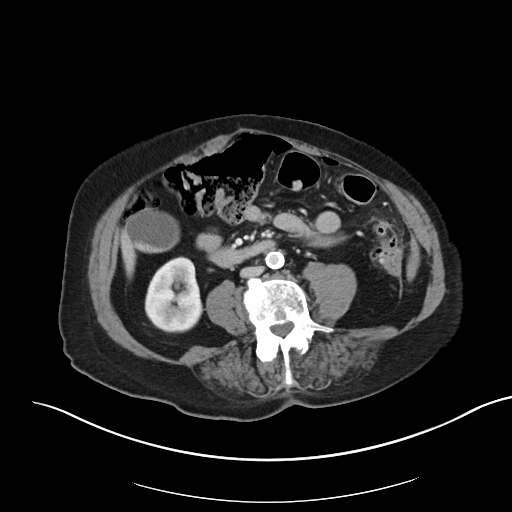
[im 58/90  soft-tissue]
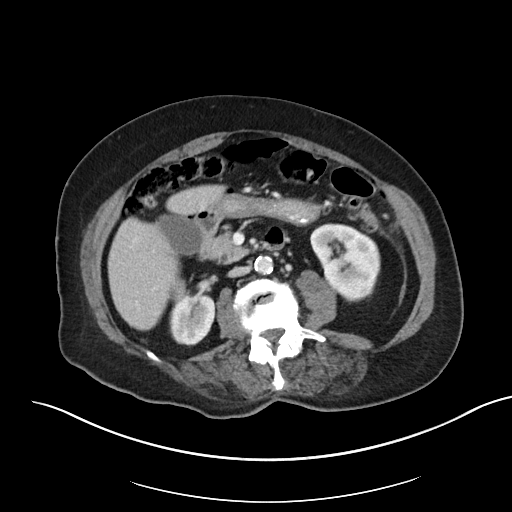
[im 64/90  soft-tissue]
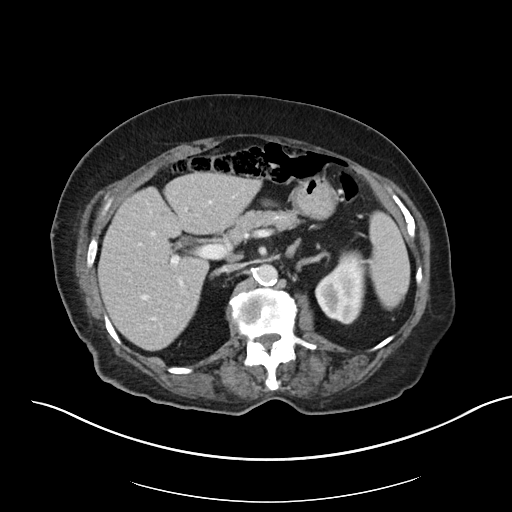
[im 64/90  bone]
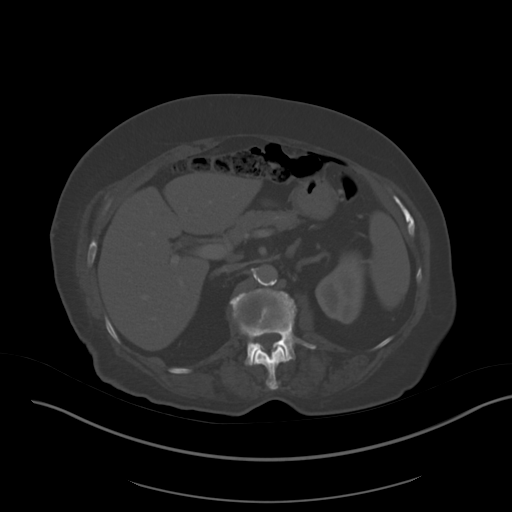
[im 70/90  soft-tissue]
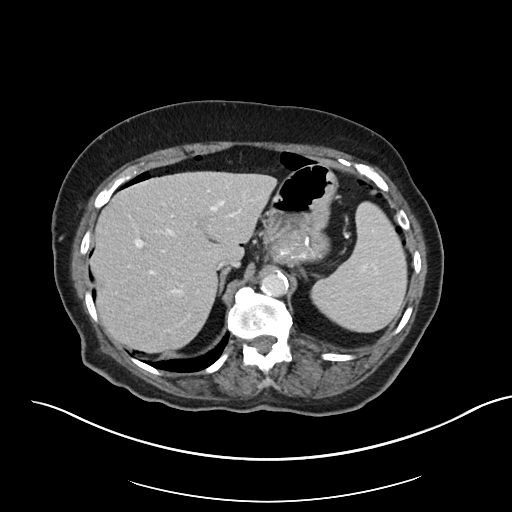
[im 77/90  soft-tissue]
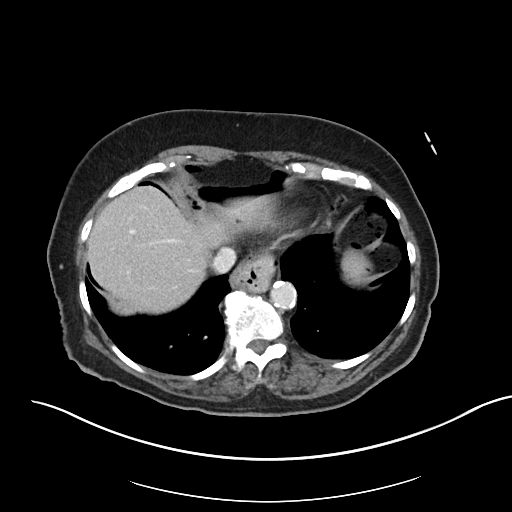
[im 83/90  soft-tissue]
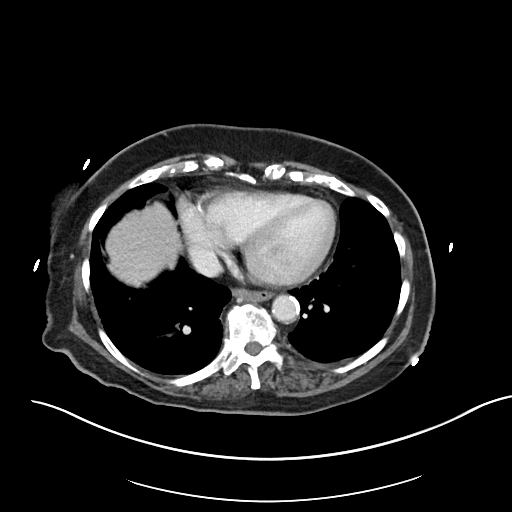

[Series 5: abdomen 3.0 mpr cor · coronal · 0.75mm/px · 3 of 87 slices shown]
[im 29/87  soft-tissue]
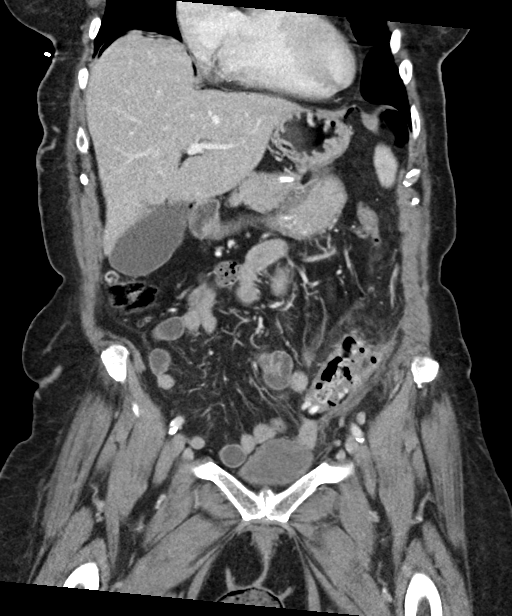
[im 39/87  soft-tissue]
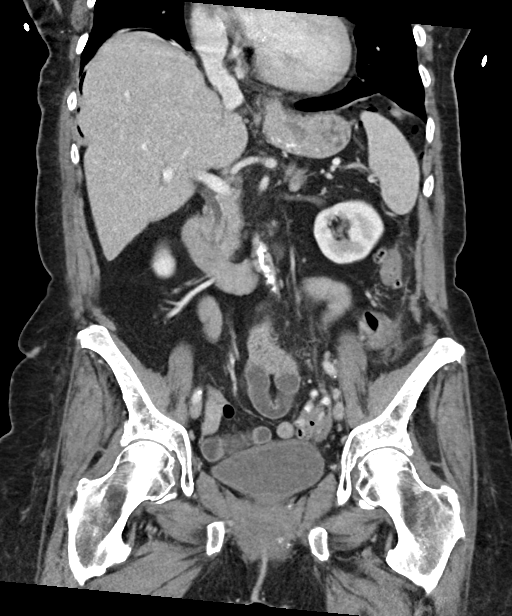
[im 48/87  soft-tissue]
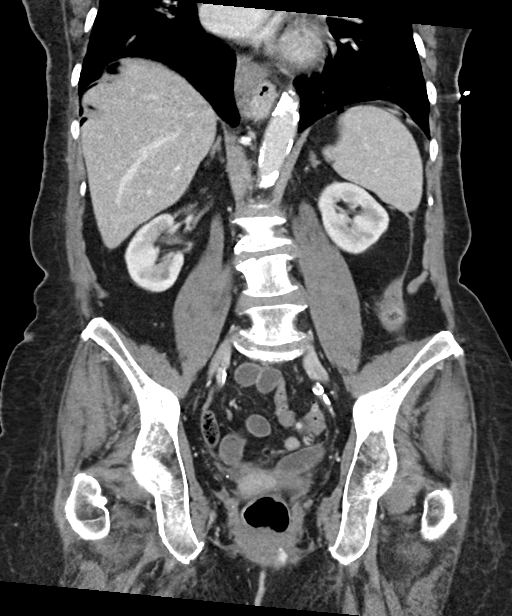

[15 of 46 positions shown; findings below may reference images not displayed]

RADIATION DOSE REDUCTION: This exam was performed according to the
departmental dose-optimization program which includes automated
exposure control, adjustment of the mA and/or kV according to
patient size and/or use of iterative reconstruction technique.

CONTRAST:  100mL OMNIPAQUE IOHEXOL 300 MG/ML  SOLN
FINDINGS: Lower chest: Lung bases show no focal infiltrate or sizable
effusion.

Hepatobiliary: Liver is well visualized and within normal limits.
Gallbladder is well distended with multiple gallstones within. No
biliary ductal dilatation is seen.

Pancreas: Unremarkable. No pancreatic ductal dilatation or
surrounding inflammatory changes.

Spleen: Normal in size without focal abnormality.

Adrenals/Urinary Tract: Adrenal glands are within normal limits.
Kidneys demonstrate a normal enhancement pattern bilaterally.
Subcentimeter hypodensity is noted in the right kidney inferiorly
likely representing a small cyst. No follow-up is recommended. No
obstructive changes are seen. The bladder is partially distended.

Stomach/Bowel: Colon shows evidence of diverticular change is well
as diverticulitis at the junction of the descending and sigmoid
colons. Some small foci of extraluminal air are noted adjacent to
the area of diverticulitis as well as more significant air in the
more superior aspect of the abdominal cavity similar to that seen on
recent chest x-ray. These changes are most consistent with
perforated diverticulitis. No abscess is noted at this time. More
proximal colon appears within normal limits. The appendix has been
surgically removed. Small bowel and stomach are within normal limits
with the exception of a hiatal hernia.

Vascular/Lymphatic: Aortic atherosclerosis. No enlarged abdominal or
pelvic lymph nodes.

Reproductive: Status post hysterectomy. No adnexal masses.

Other: No abdominal wall hernia or abnormality. No abdominopelvic
ascites.

Musculoskeletal: Degenerative changes of lumbar spine are noted.
Chronic L2 and L3 compression fractures are noted.
IMPRESSION: Changes consistent with acute diverticulitis with perforation and
considerable free intraperitoneal air. This corresponds with that
seen on recent chest x-ray.

Cholelithiasis without complicating factors.

Hiatal hernia.

## 2023-11-15 IMAGING — DX DG CHEST 1V PORT
1 series · 1 of 1 positions shown · non-contrast
Comparison: 02/05/2014

CLINICAL DATA: Hypoxia

EXAM:
PORTABLE CHEST 1 VIEW

[chest ap]
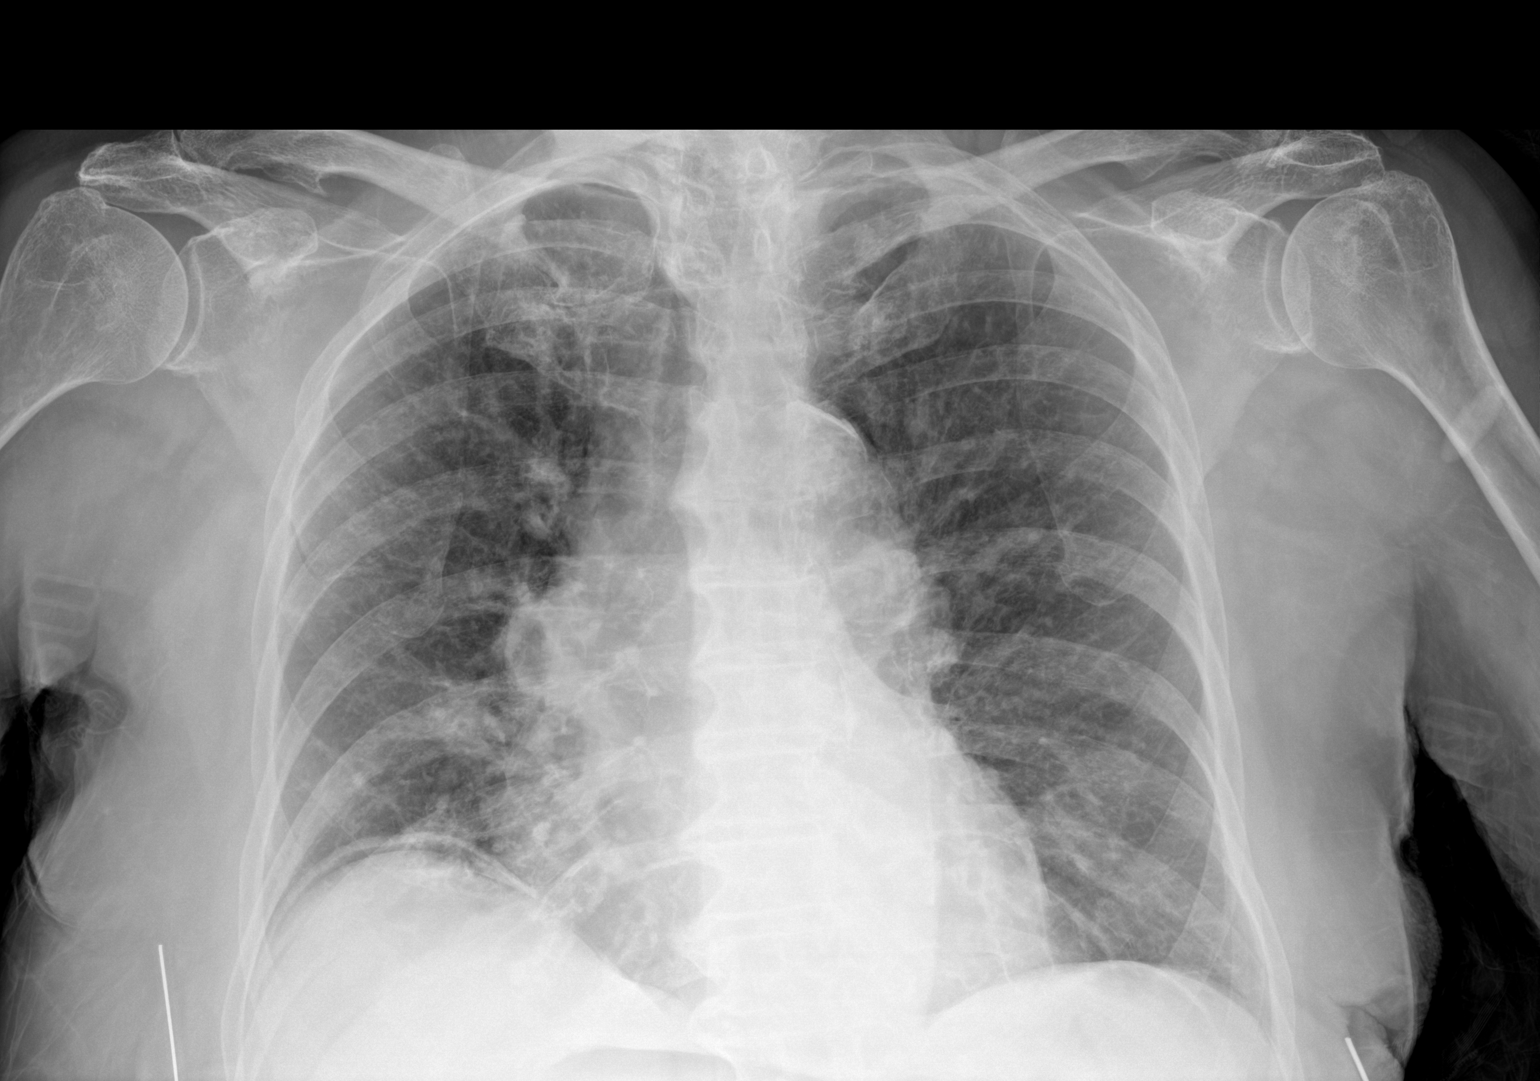

[1 of 1 positions shown; findings below may reference images not displayed]

FINDINGS: Cardiac shadow is within normal limits. Aortic calcifications are
noted. The lungs are well aerated bilaterally. No focal infiltrate
or sizable effusion is seen. There is air beneath the right
hemidiaphragm on this frontal film. Postsurgical changes are noted
in the cervical spine. Degenerative change of thoracic spine is
noted.
IMPRESSION: Free air beneath the right hemidiaphragm. CT of the abdomen and
pelvis is recommended for further evaluation.

Critical Value/emergent results were called by telephone at the time
of interpretation on 02/13/2022 at [DATE] to KLEVER JUMPER, PA ,
who verbally acknowledged these results.

## 2024-01-31 ENCOUNTER — Ambulatory Visit: Payer: Medicare Other | Admitting: Dermatology
